# Patient Record
Sex: Female | Born: 1960
Health system: Southern US, Community
[De-identification: ages and names within clinical notes are randomized; demographics above are authoritative.]

## PROBLEM LIST (undated history)

## (undated) DIAGNOSIS — E782 Mixed hyperlipidemia: Secondary | ICD-10-CM

## (undated) DIAGNOSIS — I959 Hypotension, unspecified: Secondary | ICD-10-CM

## (undated) DIAGNOSIS — Z720 Tobacco use: Secondary | ICD-10-CM

## (undated) DIAGNOSIS — I219 Acute myocardial infarction, unspecified: Secondary | ICD-10-CM

## (undated) DIAGNOSIS — I447 Left bundle-branch block, unspecified: Secondary | ICD-10-CM

## (undated) DIAGNOSIS — R079 Chest pain, unspecified: Secondary | ICD-10-CM

## (undated) DIAGNOSIS — F1090 Alcohol use, unspecified, uncomplicated: Secondary | ICD-10-CM

## (undated) DIAGNOSIS — Z7289 Other problems related to lifestyle: Secondary | ICD-10-CM

## (undated) DIAGNOSIS — I2510 Atherosclerotic heart disease of native coronary artery without angina pectoris: Secondary | ICD-10-CM

## (undated) DIAGNOSIS — I255 Ischemic cardiomyopathy: Secondary | ICD-10-CM

## (undated) DIAGNOSIS — E039 Hypothyroidism, unspecified: Secondary | ICD-10-CM

## (undated) DIAGNOSIS — Z789 Other specified health status: Secondary | ICD-10-CM

## (undated) HISTORY — DX: Ischemic cardiomyopathy: I25.5

## (undated) HISTORY — DX: Chest pain, unspecified: R07.9

## (undated) HISTORY — DX: Hypothyroidism, unspecified: E03.9

## (undated) HISTORY — DX: Mixed hyperlipidemia: E78.2

## (undated) HISTORY — DX: Hypotension, unspecified: I95.9

## (undated) HISTORY — DX: Left bundle-branch block, unspecified: I44.7

## (undated) HISTORY — DX: Atherosclerotic heart disease of native coronary artery without angina pectoris: I25.10

---

## 2009-01-01 ENCOUNTER — Emergency Department (HOSPITAL_COMMUNITY): Admission: EM | Admit: 2009-01-01 | Discharge: 2009-01-01 | Payer: Self-pay | Admitting: Emergency Medicine

## 2009-01-01 ENCOUNTER — Encounter: Payer: Self-pay | Admitting: Cardiology

## 2009-01-21 ENCOUNTER — Encounter: Payer: Self-pay | Admitting: Cardiology

## 2009-01-27 ENCOUNTER — Ambulatory Visit: Payer: Self-pay | Admitting: Cardiology

## 2009-01-27 ENCOUNTER — Ambulatory Visit (HOSPITAL_COMMUNITY): Admission: RE | Admit: 2009-01-27 | Discharge: 2009-01-27 | Payer: Self-pay | Admitting: Cardiology

## 2009-01-27 ENCOUNTER — Encounter: Payer: Self-pay | Admitting: Cardiology

## 2009-05-07 DIAGNOSIS — R079 Chest pain, unspecified: Secondary | ICD-10-CM | POA: Insufficient documentation

## 2009-05-07 DIAGNOSIS — E785 Hyperlipidemia, unspecified: Secondary | ICD-10-CM | POA: Insufficient documentation

## 2009-05-07 DIAGNOSIS — E039 Hypothyroidism, unspecified: Secondary | ICD-10-CM | POA: Insufficient documentation

## 2010-02-27 IMAGING — CR DG CHEST 1V PORT
1 series · 1 of 1 positions shown · non-contrast
Comparison: Portable exam 1661 hours without priors for comparison.

CLINICAL DATA: Chest pain, shortness of breath, dizziness

PORTABLE CHEST - 1 VIEW

[view not recorded]
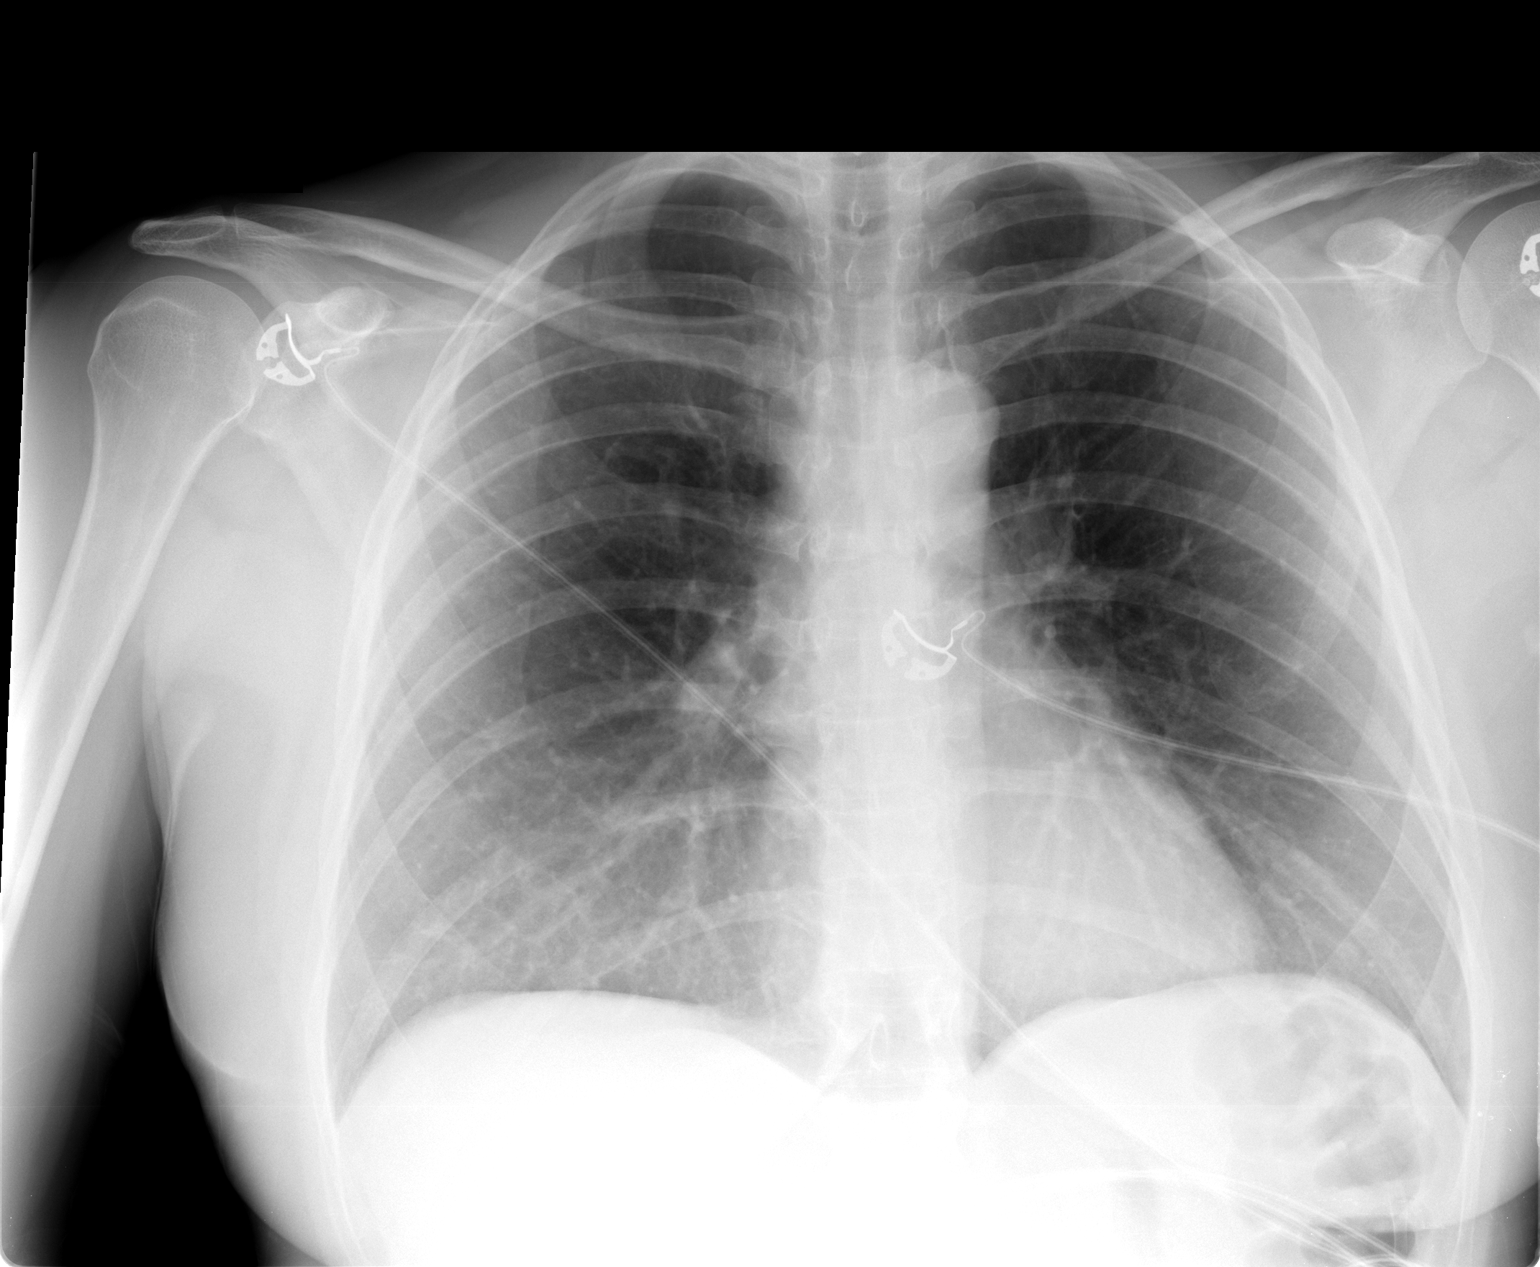

[1 of 1 positions shown; findings below may reference images not displayed]

FINDINGS: Normal heart size, mediastinal contours, and pulmonary vascularity.
Lungs clear.
No pleural effusion, pneumothorax, or focal bony abnormality.
IMPRESSION: No acute abnormalities.

## 2010-11-09 ENCOUNTER — Ambulatory Visit (HOSPITAL_COMMUNITY)
Admission: RE | Admit: 2010-11-09 | Discharge: 2010-11-09 | Payer: Self-pay | Source: Home / Self Care | Attending: Family Medicine | Admitting: Family Medicine

## 2011-02-21 LAB — BASIC METABOLIC PANEL
CO2: 25 mEq/L (ref 19–32)
Calcium: 9.6 mg/dL (ref 8.4–10.5)
Creatinine, Ser: 0.82 mg/dL (ref 0.4–1.2)
GFR calc Af Amer: >60 mL/min (ref >60)

## 2011-02-21 LAB — DIFFERENTIAL
Basophils Absolute: 0.1 10*3/uL (ref 0.0–0.1)
Basophils Relative: 1 % (ref 0–1)
Monocytes Relative: 7 % (ref 3–12)
Neutro Abs: 7.1 10*3/uL (ref 1.7–7.7)
Neutrophils Relative %: 74 % (ref 43–77)

## 2011-02-21 LAB — CBC
MCHC: 33.8 g/dL (ref 30.0–36.0)
RBC: 4.67 MIL/uL (ref 3.87–5.11)
WBC: 9.6 10*3/uL (ref 4.0–10.5)

## 2011-02-21 LAB — POCT CARDIAC MARKERS
CKMB, poc: <1 ng/mL — ABNORMAL LOW (ref 1.0–8.0)
Myoglobin, poc: 59.1 ng/mL (ref 12–200)

## 2011-02-21 LAB — T4, FREE: Free T4: 1.62 ng/dL (ref 0.89–1.80)

## 2011-03-21 NOTE — Letter (Signed)
January 27, 2009    [facility]  [address]  [place], [place] [address]   RE:  Whitney, May  MRN:  [id number]  /  DOB:  [birth year]   Whitney May returned for reevaluation today and stress testing.  She did  quite well, achieving Whitney good workload with no electrocardiographic nor  echocardiographic evidence for ischemia or infarction.  Her most recent  thyroid function studies reveal almost complete resolution of her  profound hypothyroidism.  Her most recent lipid profile is abnormal,  possibly partially as Whitney result of her hypothyroid state.  She continues  to smoke cigarettes.   PHYSICAL EXAMINATION:  GENERAL:  Pleasant [name].  VITAL SIGNS:  The heart rate is 72 and regular, blood pressure 125/75,  respirations 12 and unlabored.  NECK:  No jugular venous distention.  LUNGS:  Clear.  CARDIAC:  Normal first and second heart sounds.  EXTREMITIES:  No edema.   IMPRESSION:  Whitney May has had no further episodes of chest discomfort.  With Whitney negative stress echocardiogram, the likelihood of coronary  disease is minimal.  She does not need  additional cardiology followup at this time.  We will recommend Whitney heart  healthy diet to her and the use of Benecol margarine.  You may wish to  obtain Whitney followup lipid profile in Whitney month or two.  The importance of  uninterrupted use of her levothyroxine was emphasized.  Please let me  know at any time that I can assist in the care of this nice May in the  future.    Sincerely,      Gerrit Friends. Dietrich Pates, MD, [facility]  Electronically Signed    RMR/MedQ  DD: 01/28/2009  DT: 01/29/2009  Job #: [id number]

## 2011-03-21 NOTE — Procedures (Signed)
[name], May                ACCOUNT NO.:  [id number]   MEDICAL RECORD NO.:  [id number]          PATIENT TYPE:  OUT   LOCATION:  RAD                           FACILITY:  [facility]   PHYSICIAN:  Gerrit Friends. Dietrich Pates, MD, FACCDATE OF BIRTH:  10/11/1961   DATE OF PROCEDURE:  01/27/2009  DATE OF DISCHARGE:  01/27/2009                                ECHOCARDIOGRAM   REFERRED BY:  [facility] Department   CLINICAL DATA:  A 49 year old woman with hypothyroidism presenting with  chest pain consistent with myocardial ischemia; vascular risk factors  include tobacco use and hyperlipidemia.  1. Treadmill exercise performed to workload of 12.8 METS and a heart      rate of 159, 91% of age-predicted maximum.  Exercise discontinued      due to dyspnea and fatigue; no chest discomfort reported.  2. Blood pressure increased from a resting value of 125/75 to 150/80      during exercise, a normal response.  3. No arrhythmias noted.  4. EKG:  Normal sinus rhythm; delayed R-wave progression - Cannot      exclude prior septal myocardial infarction.  5. Stress EKG:  Interpretation somewhat impaired by muscle artifact;      no significant ST-segment abnormalities identified.  6. Baseline echocardiogram:  Normal left atrium, right atrium, and      right ventricle; normal aortic and mitral valve; normal internal      dimension, wall thickness, regional and global function of the left      ventricle.  7. Post-exercise echocardiographic images:  Decrease in left      ventricular volume and increase in contractility in all myocardial      segments.   IMPRESSION:  Negative stress echocardiogram revealing good exercise  tolerance, no structural heart abnormalities and neither  electrocardiographic nor echocardiographic evidence for ischemia or  infarction.  Other findings as noted.      Gerrit Friends. Dietrich Pates, MD, [facility]  Electronically Signed     RMR/MEDQ  D:  01/29/2009  T:  01/30/2009  Job:   [id number]

## 2011-03-21 NOTE — Letter (Signed)
January 21, 2009    [facility]  [address]  [place], [place] [address]   RE:  Whitney May, Whitney May  MRN:  [id number]  /  DOB:  12-Jun-1961   Dear Sir/Madam:   It was my pleasure evaluating Whitney May in the office today in  consultation at your request.  As you know, she first experienced an  episode of chest discomfort approximately 8 weeks ago.  This was an  impressive episode with severe chest pressure, diaphoresis, dyspnea,  radiation to the left arm, and a brief loss of consciousness with a  contusion on her forehead.  Despite the impressive nature of these  symptoms, she did not seek evaluation until 5 days thereafter.  In the  office, no particular problems were apparent, and she was referred here.  Prior to her evaluation, she experienced another episode of prompting  her to present to the emergency department.  Basic testing, cardiac  markers, chest x-ray, and EKG were all interpreted as normal.  She was  told that her symptoms were noncardiac in origin.   She has profound hypothyroidism and presented with a TSH of nearly 150  two months ago after discontinuing her thyroid replacement therapy for  approximately 4 months.  She smokes one half pack per day, down from one  pack a day.  She intermittently drinks substantial amounts of alcohol,  most recently in a binge pattern.   PAST MEDICAL HISTORY:  Otherwise, generally benign.  She has delivered 2  children by cesarean section.  She has had some issues with her weight,  but has never been obese.  She underwent an excisional biopsy of the  left breast the results of which were benign.   She reports an allergy to PENICILLIN.   MEDICATIONS:  1. Levothyroxine 0.125 mg daily.  2. Aspirin 81 mg daily.  3. Fish oil 1000 mg daily.  4. A calcium supplement.  5. A multivitamin.   SOCIAL HISTORY:  Unemployed - laid off from the [employer].  She  currently leads a sedentary lifestyle.  She is married with 2  children.   FAMILY HISTORY:  Father died due to myocardial infarction; mother is  alive and well; 3 siblings, 1 of whom died due to drug overdose.   REVIEW OF SYSTEMS:  Notable for headaches, the need for corrective  lenses for near vision, partial upper dentures, intermittent  palpitations, prior tubal ligation, and intermittent ankle edema.  All  other systems reviewed and are negative.   PHYSICAL EXAMINATION:  GENERAL:  Pleasant, proportionate woman in no  acute distress.  VITAL SIGNS:  The weight is 144, blood pressure 105/80, heart rate 75  and regular, respirations 12 and unlabored.  HEENT:  Anicteric sclerae; normal lids and conjunctivae; normal oral  mucosa.  Hoarse voice.  NECK:  No jugular venous distention; normal carotid upstrokes without  bruits.  ENDOCRINE:  No thyromegaly.  HEMATOPOIETIC:  No adenopathy.  LUNGS:  Clear.  Cardiac:  Normal first and second heart sounds; normal PMI.  ABDOMEN:  Soft and nontender; normal bowel sounds; pierced umbilicus; no  masses.  EXTREMITIES:  Normal distal pulses; no edema.  NEUROLOGIC:  Symmetric strength and tone; normal cranial nerves.   EKG:  Normal sinus rhythm; left atrial abnormality; markedly delayed R-  wave progression suggestive of prior anteroseptal myocardial infarction.  The tracing obtained in the emergency department was also reviewed.  The  final reading on this was in fact anteroseptal MI, although  this was not  noted in the emergency department note.  R-wave progression was somewhat  better on the ER tracing that it was on our office tracing.   IMPRESSION:  Whitney May has had 2 symptomatic spells of chest discomfort  that is highly suggestive of an ischemic etiology with typical  associated symptoms and radiation.  Nonetheless, as a premenopausal 3-  year-old without diabetes or hypertension, she does not have a very high  risk for coronary disease.  This could represent coronary artery spasm.  It is possible  that the marked systemic perturbations of her  hypothyroidism could have contributed to that.  She has been told of  hyperlipidemia in the past - we will attempt to obtain those records.  For now, I will give her sublingual nitroglycerin to use on a p.r.n.  basis.  She will return for an echocardiogram to rule out prior  myocardial infarction and a stress echocardiogram to rule out ischemia.  I will let you know the results of those studies as soon as they have  been completed.  Thanks for sending this nice woman to me and for  sending me such good office records.    Sincerely,      Gerrit Friends. Dietrich Pates, MD, [facility]  Electronically Signed    RMR/MedQ  DD: 01/21/2009  DT: 01/22/2009  Job #: [id number]

## 2011-10-18 ENCOUNTER — Encounter: Payer: Self-pay | Admitting: Cardiology

## 2015-09-24 ENCOUNTER — Emergency Department (HOSPITAL_COMMUNITY): Payer: BLUE CROSS/BLUE SHIELD

## 2015-09-24 ENCOUNTER — Observation Stay (HOSPITAL_COMMUNITY)
Admission: EM | Admit: 2015-09-24 | Discharge: 2015-09-25 | Disposition: A | Discharge status: Home / Self Care | Payer: BLUE CROSS/BLUE SHIELD | Attending: Internal Medicine | Admitting: Internal Medicine

## 2015-09-24 ENCOUNTER — Emergency Department (HOSPITAL_BASED_OUTPATIENT_CLINIC_OR_DEPARTMENT_OTHER): Payer: BLUE CROSS/BLUE SHIELD

## 2015-09-24 ENCOUNTER — Encounter (HOSPITAL_COMMUNITY): Payer: Self-pay

## 2015-09-24 DIAGNOSIS — R0789 Other chest pain: Secondary | ICD-10-CM | POA: Diagnosis present

## 2015-09-24 DIAGNOSIS — R079 Chest pain, unspecified: Secondary | ICD-10-CM

## 2015-09-24 DIAGNOSIS — Z880 Allergy status to penicillin: Secondary | ICD-10-CM | POA: Diagnosis not present

## 2015-09-24 DIAGNOSIS — Z789 Other specified health status: Secondary | ICD-10-CM | POA: Diagnosis not present

## 2015-09-24 DIAGNOSIS — Z7289 Other problems related to lifestyle: Secondary | ICD-10-CM | POA: Diagnosis present

## 2015-09-24 DIAGNOSIS — Z7982 Long term (current) use of aspirin: Secondary | ICD-10-CM | POA: Diagnosis not present

## 2015-09-24 DIAGNOSIS — E785 Hyperlipidemia, unspecified: Secondary | ICD-10-CM | POA: Insufficient documentation

## 2015-09-24 DIAGNOSIS — Z79899 Other long term (current) drug therapy: Secondary | ICD-10-CM | POA: Insufficient documentation

## 2015-09-24 DIAGNOSIS — F17200 Nicotine dependence, unspecified, uncomplicated: Secondary | ICD-10-CM | POA: Diagnosis not present

## 2015-09-24 DIAGNOSIS — I313 Pericardial effusion (noninflammatory): Principal | ICD-10-CM | POA: Insufficient documentation

## 2015-09-24 DIAGNOSIS — I319 Disease of pericardium, unspecified: Secondary | ICD-10-CM | POA: Diagnosis not present

## 2015-09-24 DIAGNOSIS — M549 Dorsalgia, unspecified: Secondary | ICD-10-CM | POA: Diagnosis present

## 2015-09-24 DIAGNOSIS — E038 Other specified hypothyroidism: Secondary | ICD-10-CM | POA: Diagnosis not present

## 2015-09-24 DIAGNOSIS — I447 Left bundle-branch block, unspecified: Secondary | ICD-10-CM

## 2015-09-24 DIAGNOSIS — E039 Hypothyroidism, unspecified: Secondary | ICD-10-CM | POA: Diagnosis present

## 2015-09-24 DIAGNOSIS — Z720 Tobacco use: Secondary | ICD-10-CM

## 2015-09-24 DIAGNOSIS — I3139 Other pericardial effusion (noninflammatory): Secondary | ICD-10-CM | POA: Diagnosis present

## 2015-09-24 DIAGNOSIS — E782 Mixed hyperlipidemia: Secondary | ICD-10-CM | POA: Diagnosis not present

## 2015-09-24 HISTORY — DX: Alcohol use, unspecified, uncomplicated: F10.90

## 2015-09-24 HISTORY — DX: Other specified health status: Z78.9

## 2015-09-24 HISTORY — DX: Tobacco use: Z72.0

## 2015-09-24 HISTORY — DX: Other problems related to lifestyle: Z72.89

## 2015-09-24 LAB — TSH: TSH: 4.088 u[IU]/mL (ref 0.350–4.500)

## 2015-09-24 LAB — BASIC METABOLIC PANEL
ANION GAP: 8 (ref 5–15)
BUN: 10 mg/dL (ref 6–20)
CALCIUM: 8.8 mg/dL — AB (ref 8.9–10.3)
CO2: 26 mmol/L (ref 22–32)
Chloride: 100 mmol/L — ABNORMAL LOW (ref 101–111)
Creatinine, Ser: 0.84 mg/dL (ref 0.44–1.00)
Glucose, Bld: 131 mg/dL — ABNORMAL HIGH (ref 65–99)
Potassium: 4.4 mmol/L (ref 3.5–5.1)
Sodium: 134 mmol/L — ABNORMAL LOW (ref 135–145)

## 2015-09-24 LAB — CBC WITH DIFFERENTIAL/PLATELET
BASOS ABS: 0 10*3/uL (ref 0.0–0.1)
BASOS PCT: 0 %
Eosinophils Absolute: 0.1 10*3/uL (ref 0.0–0.7)
Eosinophils Relative: 1 %
HEMATOCRIT: 41 % (ref 36.0–46.0)
Hemoglobin: 13.6 g/dL (ref 12.0–15.0)
Lymphocytes Relative: 12 %
Lymphs Abs: 1.8 10*3/uL (ref 0.7–4.0)
MCH: 30.6 pg (ref 26.0–34.0)
MCHC: 33.2 g/dL (ref 30.0–36.0)
MCV: 92.3 fL (ref 78.0–100.0)
MONO ABS: 2 10*3/uL — AB (ref 0.1–1.0)
Monocytes Relative: 14 %
NEUTROS ABS: 10.9 10*3/uL — AB (ref 1.7–7.7)
Neutrophils Relative %: 74 %
PLATELETS: 292 10*3/uL (ref 150–400)
RBC: 4.44 MIL/uL (ref 3.87–5.11)
RDW: 13.6 % (ref 11.5–15.5)
WBC: 14.9 10*3/uL — AB (ref 4.0–10.5)

## 2015-09-24 LAB — TROPONIN I: Troponin I: <0.03 ng/mL (ref <0.031)

## 2015-09-24 LAB — D-DIMER, QUANTITATIVE (NOT AT ARMC): D DIMER QUANT: 0.97 ug{FEU}/mL — AB (ref 0.00–0.50)

## 2015-09-24 MED ORDER — NAPROXEN SODIUM 220 MG PO TABS: 220.0000 mg | ORAL_TABLET | Freq: Two times a day (BID) | ORAL | Status: DC

## 2015-09-24 MED ORDER — SODIUM CHLORIDE 0.9 % IJ SOLN
3.0000 mL | Freq: Two times a day (BID) | INTRAMUSCULAR | Status: DC
  Administered 2015-09-24: 3 mL via INTRAVENOUS

## 2015-09-24 MED ORDER — ALBUTEROL SULFATE (2.5 MG/3ML) 0.083% IN NEBU: 2.5000 mg | INHALATION_SOLUTION | RESPIRATORY_TRACT | Status: DC | PRN

## 2015-09-24 MED ORDER — ADULT MULTIVITAMIN W/MINERALS CH
1.0000 | ORAL_TABLET | Freq: Every day | ORAL | Status: DC
  Administered 2015-09-24 – 2015-09-25 (×2): 1 via ORAL
  Filled 2015-09-24 (×2): qty 1

## 2015-09-24 MED ORDER — ASPIRIN 81 MG PO CHEW
81.0000 mg | CHEWABLE_TABLET | Freq: Once | ORAL | Status: AC
  Administered 2015-09-24: 81 mg via ORAL
  Filled 2015-09-24: qty 1

## 2015-09-24 MED ORDER — IOHEXOL 350 MG/ML SOLN
100.0000 mL | Freq: Once | INTRAVENOUS | Status: AC | PRN
  Administered 2015-09-24: 100 mL via INTRAVENOUS

## 2015-09-24 MED ORDER — HYDROCODONE-ACETAMINOPHEN 10-325 MG PO TABS: 2.0000 | ORAL_TABLET | ORAL | Status: DC | PRN

## 2015-09-24 MED ORDER — THIAMINE HCL 100 MG/ML IJ SOLN: 100.0000 mg | Freq: Every day | INTRAMUSCULAR | Status: DC

## 2015-09-24 MED ORDER — MORPHINE SULFATE (PF) 2 MG/ML IV SOLN: 2.0000 mg | INTRAVENOUS | Status: DC | PRN

## 2015-09-24 MED ORDER — LEVOTHYROXINE SODIUM 112 MCG PO TABS
112.0000 ug | ORAL_TABLET | Freq: Every day | ORAL | Status: DC
  Administered 2015-09-25: 112 ug via ORAL
  Filled 2015-09-24: qty 1

## 2015-09-24 MED ORDER — VITAMIN B-1 100 MG PO TABS
100.0000 mg | ORAL_TABLET | Freq: Every day | ORAL | Status: DC
  Administered 2015-09-24 – 2015-09-25 (×2): 100 mg via ORAL
  Filled 2015-09-24 (×2): qty 1

## 2015-09-24 MED ORDER — PANTOPRAZOLE SODIUM 40 MG PO TBEC
40.0000 mg | DELAYED_RELEASE_TABLET | Freq: Every day | ORAL | Status: DC
  Administered 2015-09-24: 40 mg via ORAL
  Filled 2015-09-24: qty 1

## 2015-09-24 MED ORDER — ROSUVASTATIN CALCIUM 10 MG PO TABS
10.0000 mg | ORAL_TABLET | Freq: Every day | ORAL | Status: DC
  Filled 2015-09-24: qty 1

## 2015-09-24 MED ORDER — NAPROXEN SODIUM 550 MG PO TABS
550.0000 mg | ORAL_TABLET | Freq: Two times a day (BID) | ORAL | Status: DC
  Filled 2015-09-24 (×6): qty 1

## 2015-09-24 MED ORDER — ENOXAPARIN SODIUM 40 MG/0.4ML ~~LOC~~ SOLN
40.0000 mg | SUBCUTANEOUS | Status: DC
Start: 2015-09-24 — End: 2015-09-25
  Administered 2015-09-24: 40 mg via SUBCUTANEOUS
  Filled 2015-09-24: qty 0.4

## 2015-09-24 MED ORDER — ASPIRIN EC 325 MG PO TBEC
325.0000 mg | DELAYED_RELEASE_TABLET | Freq: Every day | ORAL | Status: DC
Start: 2015-09-25 — End: 2015-09-25
  Administered 2015-09-25: 325 mg via ORAL
  Filled 2015-09-24: qty 1

## 2015-09-24 MED ORDER — ACETAMINOPHEN 650 MG RE SUPP: 650.0000 mg | Freq: Four times a day (QID) | RECTAL | Status: DC | PRN

## 2015-09-24 MED ORDER — CYCLOBENZAPRINE HCL 10 MG PO TABS
5.0000 mg | ORAL_TABLET | Freq: Three times a day (TID) | ORAL | Status: DC
  Administered 2015-09-24 – 2015-09-25 (×3): 5 mg via ORAL
  Filled 2015-09-24 (×3): qty 1

## 2015-09-24 MED ORDER — ROSUVASTATIN CALCIUM 10 MG PO TABS: 10.0000 mg | ORAL_TABLET | Freq: Every day | ORAL | Status: DC

## 2015-09-24 MED ORDER — FOLIC ACID 1 MG PO TABS
1.0000 mg | ORAL_TABLET | Freq: Every day | ORAL | Status: DC
Start: 2015-09-24 — End: 2015-09-25
  Administered 2015-09-24 – 2015-09-25 (×2): 1 mg via ORAL
  Filled 2015-09-24 (×2): qty 1

## 2015-09-24 MED ORDER — ACETAMINOPHEN 325 MG PO TABS: 650.0000 mg | ORAL_TABLET | Freq: Four times a day (QID) | ORAL | Status: DC | PRN

## 2015-09-24 MED ORDER — LORAZEPAM 2 MG/ML IJ SOLN: 1.0000 mg | Freq: Four times a day (QID) | INTRAMUSCULAR | Status: DC | PRN

## 2015-09-24 MED ORDER — LEVOTHYROXINE SODIUM 112 MCG PO TABS: 112.0000 ug | ORAL_TABLET | Freq: Every day | ORAL | Status: DC

## 2015-09-24 MED ORDER — NAPROXEN 250 MG PO TABS
500.0000 mg | ORAL_TABLET | Freq: Two times a day (BID) | ORAL | Status: DC
  Administered 2015-09-24 – 2015-09-25 (×2): 500 mg via ORAL
  Filled 2015-09-24 (×2): qty 2

## 2015-09-24 MED ORDER — NICOTINE 21 MG/24HR TD PT24
21.0000 mg | MEDICATED_PATCH | Freq: Every day | TRANSDERMAL | Status: DC
  Administered 2015-09-24: 21 mg via TRANSDERMAL
  Filled 2015-09-24 (×2): qty 1

## 2015-09-24 MED ORDER — LORAZEPAM 1 MG PO TABS: 1.0000 mg | ORAL_TABLET | Freq: Four times a day (QID) | ORAL | Status: DC | PRN

## 2015-09-24 NOTE — Consult Note (Signed)
CARDIOLOGY CONSULT NOTE   Patient ID: Whitney May MRN: [id number] DOB/AGE: 52-Aug-1962 52 y.o.  Admit Date: 09/24/2015 Referring Physician: PTH Primary Physician: Toma Deiters, MD Consulting Cardiologist: Prentice Docker MD Primary Cardiologist: New (Formerly Dietrich Pates)  Reason for Consultation: Chest Pain, new LBBB.   Clinical Summary [name] is a 54 y.o.female with known history of hyperlipidemia, hypothyroidism, ETOH and tobacco abuse, presented to ER with complaints of back pain and chest pain.   She states that pain began yesterday evening beginning between her shoulder blades and radiating to the back of her neck and into her shoulders bilaterally. She then began to have aching feeling substernal. Lasted all night and did not allow her to sleep. The following morning while walking at work, she began to have worsening chest discomfort and dyspnea. Pain became worse with deep breathing. 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  On arrival to ER BP 132/76, HR 97, O2 Sat 98%. Afebrile. D-Dimer elevated. Leukocytosis with WBC 15.3, Na 134, glucose 131.  CT scan to rule out PE was negative but was found to have a prominent pericardial effusion, and emphysema.  CXR without acute cardiopulmonary disease. EKG revealed NSR with LBBB which is a new finding compared to previous EKG in March 2010. Preliminary echo report demonstrated small pericardial effusion. Troponin negative x 1.   Pain continues but "not as bad" continues to hurt with deep breathing. She admits to binge drinking on her days off with mixed drinks and wine.    Allergies  Allergen Reactions  . Penicillins Other (See Comments)    Fever, vomiting Has patient had a PCN reaction causing immediate rash, facial/tongue/throat swelling, SOB or lightheadedness with hypotension:  no Has patient had a PCN reaction causing severe rash involving mucus membranes or skin necrosis:  no Has patient had a  PCN reaction that required hospitalization  no Has patient had a PCN reaction occurring within the last 10 years:  no If all of the above answers are "NO", then may procee    Medications Scheduled Medications:    Infusions:    PRN Medications:     Past Medical History  Diagnosis Date  . Hypothyroidism   . Hyperlipidemia, mixed   . Chest pain, unspecified   . Tobacco abuse   . Alcohol use [facility])     Past Surgical History  Procedure Laterality Date  . Cesarean section      Family History  Problem Relation Age of Onset  . Coronary artery disease Mother   . Coronary artery disease Maternal Grandmother   . Hypertension Father   . Diabetes Father     Social History [name] reports that she has been smoking.  She does not have any smokeless tobacco history on file. [name] reports that she drinks about 4.8 oz of alcohol per week.  Review of Systems Complete review of systems are found to be negative unless outlined in H&P above.  Physical Examination Blood pressure 111/64, pulse 92, temperature 99.1 F (37.3 C), temperature source Oral, resp. rate 20, height  (1.575 m), weight 133 lb (60.328 kg), SpO2 95 %. No intake or output data in the 24 hours ending 09/24/15 1700  Telemetry: SR with LBBB.   GEN: No acute distress  HEENT: Conjunctiva and lids normal, oropharynx clear with moist mucosa. Neck: Supple, no elevated JVP or carotid bruits, no thyromegaly. Lungs: Upper airway raspy, but otherwise clear. Pain with  inspiration.  Cardiac: Regular rate and rhythm, no S3 or significant systolic murmur, no pericardial rub. Abdomen: Soft, nontender, no hepatomegaly, bowel sounds present, no guarding or rebound. Extremities: No pitting edema, distal pulses 2+. Skin: Warm and dry. Musculoskeletal: No kyphosis. Neuropsychiatric: Alert and oriented x3, affect grossly appropriate.  Prior Cardiac Testing/Procedures 1. Stress Echo 01/29/2009 CLINICAL DATA: A  54 year old woman with hypothyroidism presenting with chest pain consistent with myocardial ischemia; vascular risk factors include tobacco use and hyperlipidemia. 1. Treadmill exercise performed to workload of 12.8 METS and a heart  rate of 159, 91% of age-predicted maximum. Exercise discontinued  due to dyspnea and fatigue; no chest discomfort reported. 2. Blood pressure increased from a resting value of 125/75 to 150/80  during exercise, a normal response. 3. No arrhythmias noted. 4. EKG: Normal sinus rhythm; delayed R-wave progression - Cannot  exclude prior septal myocardial infarction. 5. Stress EKG: Interpretation somewhat impaired by muscle artifact;  no significant ST-segment abnormalities identified. 6. Baseline echocardiogram: Normal left atrium, right atrium, and  right ventricle; normal aortic and mitral valve; normal internal  dimension, wall thickness, regional and global function of the left  ventricle. 7. Post-exercise echocardiographic images: Decrease in left  ventricular volume and increase in contractility in all myocardial  segments.  IMPRESSION: Negative stress echocardiogram revealing good exercise tolerance, no structural heart abnormalities and neither electrocardiographic nor echocardiographic evidence for ischemia or infarction.   Lab Results  Basic Metabolic Panel:  Recent Labs Lab 09/24/15 1104  NA 134*  K 4.4  CL 100*  CO2 26  GLUCOSE 131*  BUN 10  CREATININE 0.84  CALCIUM 8.8*    Liver Function Tests: No results for input(s): AST, ALT, ALKPHOS, BILITOT, PROT, ALBUMIN in the last 168 hours.  CBC:  Recent Labs Lab 09/24/15 1104  WBC 14.9*  NEUTROABS 10.9*  HGB 13.6  HCT 41.0  MCV 92.3  PLT 292    Cardiac Enzymes:  Recent Labs Lab 09/24/15 1104  TROPONINI <0.03    Radiology: Dg Chest 2 View  09/24/2015  CLINICAL DATA:  Chest pain that goes into left shoulder  with sob since yesterday/smoker EXAM: CHEST  2 VIEW COMPARISON:  01/01/2009 FINDINGS: Cardiac silhouette is mildly enlarged. No mediastinal or hilar masses or evidence of adenopathy. Mild linear subsegmental atelectasis noted at the lung bases. No lung consolidation to suggest pneumonia. No pulmonary edema. No pleural effusion or pneumothorax. Skeletal structures are unremarkable. IMPRESSION: No acute cardiopulmonary disease. Electronically Signed   By: Amie Portland M.D.   On: 09/24/2015 12:16   Ct Angio Chest Pe W/cm &/or Wo Cm  09/24/2015  CLINICAL DATA:  Chest pain and shortness of breath. EXAM: CT ANGIOGRAPHY CHEST WITH CONTRAST TECHNIQUE: Multidetector CT imaging of the chest was performed using the standard protocol during bolus administration of intravenous contrast. Multiplanar CT image reconstructions and MIPs were obtained to evaluate the vascular anatomy. CONTRAST:  OMNIPAQUE IOHEXOL 350 MG/ML SOLN COMPARISON:  Chest x-ray 09/24/2015 FINDINGS: There are no pulmonary emboli. There is a prominent pericardial effusion. Multiple slightly prominent lymph nodes in the mediastinum, primarily in the aortopulmonary window with the largest transverse diameter being 12 mm. Slight emphysematous blebs at the lung apices. 4 mm nodule in the left lung apex on image 16 of series 4. Small intrapulmonary node along the minor fissure on image 53 of series 4. Slight atelectasis or scarring at the lung base. No osseous abnormality. The visualized portion of the upper abdomen is normal. Review of the MIP images  confirms the above findings. IMPRESSION: 1. No pulmonary emboli. 2. Prominent pericardial effusion. No appreciable compression of the heart. 3. Slight emphysematous disease at the apices. 4. 4 mm nodule in the left lung apex. If the patient is at high risk for bronchogenic carcinoma, follow-up chest CT at 1 year is recommended. If the patient is at low risk, no follow-up is needed. This recommendation follows  the consensus statement: Guidelines for Management of Small Pulmonary Nodules Detected on CT Scans: A Statement from the Fleischner Society as published in Radiology 2005; 237:395-400. Electronically Signed   By: Francene Boyers M.D.   On: 09/24/2015 13:37     ECG: SR with LBBB and Left axis deviation.    Impression and Recommendations  1. Chest Pain: Typical and atypical features. Substernal ache, pain also between shoulder blades and back of neck. Troponin is negative X 1. New LBBB of unknown duration as she has not been seen by cardiology since 2010 with normal stress echo.Pain is constant and becomes worse with deep breathing.   There is a pleuritic component to her symptoms. Will restart her NSAIDs to help with inflammatory type symptoms. She has CVRF for CAD to include FH, hypercholesterolemia, thyroid disease, tobacco abuse. S  Would continue to cycle enzymes to evaluate for changes. She would benefit from nuclear stress test once her breathing status improves. No need to transfer to [facility] unless enzymes become positive or chest pain worsens.   2. Mild Pericardial Effusion: Please formal echo report once completed. If significant decrease in EF would plan cardiac cath instead of stress test.   3. Hypercholesterolemia: She is on statin. Continue and check lipids in the am.   4. Ongoing tobacco abuse: May need nicotine patch.       Signed: Bettey Mare. Lawrence NP [facility]  09/24/2015, 5:00 PM Co-Sign MD  The patient was seen and examined, and I agree with the assessment and plan as documented above, with modifications as noted below. Pt admitted with chest tightness and exertional dyspnea which began at 6 pm yesterday. Pain made worse with deep inspiration. Has h/o pleurisy. Has smoked 1 ppd since age 74. Troponins normal x 2. "New" left bundle branch block since 2010. I reviewed echocardiogram, which showed a small to medium size pericardial effusion without tamponade physiology. LV  systolic function is normal, EF 60-65%, mild LVH, grade 2 diastolic dysfunction. If all troponins are normal, can proceed with an outpatient Lexiscan Cardiolite stress test which I will arrange. I will also arrange for follow up with me in our [place] office.   Prentice Docker, MD, [facility]  09/24/2015 7:07 PM

## 2015-09-24 NOTE — ED Provider Notes (Signed)
CSN: [id number]     Arrival date & time 09/24/15  1038 History  By signing my name below, I, Whitney May, attest that this documentation has been prepared under the direction and in the presence of Gerhard Munch, MD.  Electronically Signed: Gwenyth May, ED Scribe. 09/24/2015. 12:20 PM.   Chief Complaint  Patient presents with  . Chest Pain   The history is provided by the patient. No language interpreter was used.   HPI Comments: Whitney May is a 54 y.o. female with a history of hypothyroidism and hyperlipidemia who presents to the Emergency Department complaining of constant, moderate, bilateral posterior shoulder pain that radiates to her left chest and arm and started 18 hours ago. She states recent weight gain and bloating as associated symptoms. Her symptoms become worse with deep breathing and exertion. Pt smokes cigarettes. She denies fever, hemoptysis, nausea, vomiting, syncope, confusion and leg swelling.   Past Medical History  Diagnosis Date  . Hypothyroidism   . Hyperlipidemia, mixed   . Chest pain, unspecified    Past Surgical History  Procedure Laterality Date  . Cesarean section     Family History  Problem Relation Age of Onset  . Coronary artery disease Other    Social History  Substance Use Topics  . Smoking status: Current Every Day Smoker -- 1.00 packs/day  . Smokeless tobacco: None  . Alcohol Use: No   OB History    No data available     Review of Systems  Constitutional: Negative for fever.       Per HPI, otherwise negative  HENT:       Per HPI, otherwise negative  Respiratory:       Per HPI, otherwise negative  Cardiovascular: Positive for chest pain. Negative for leg swelling.       Per HPI, otherwise negative  Gastrointestinal: Negative for nausea and vomiting.  Endocrine:       Negative aside from HPI  Genitourinary:       Neg aside from HPI   Musculoskeletal: Positive for arthralgias.       Per HPI, otherwise negative  Skin:  Negative.   Neurological: Negative for syncope.  Psychiatric/Behavioral: Negative for confusion.  All other systems reviewed and are negative.  Allergies  Penicillins  Home Medications   Prior to Admission medications   Medication Sig Start Date End Date Taking? Authorizing Provider  aspirin 325 MG tablet Take 325 mg by mouth daily as needed for moderate pain.   Yes Historical Provider, MD  clobetasol cream (TEMOVATE) 0.05 % Apply 1 application topically daily. 07/09/15  Yes Historical Provider, MD  hydroxypropyl methylcellulose / hypromellose (ISOPTO TEARS / GONIOVISC) 2.5 % ophthalmic solution Place 1 drop into both eyes as needed for dry eyes.   Yes Historical Provider, MD  levothyroxine (SYNTHROID, LEVOTHROID) 112 MCG tablet Take 1 tablet by mouth daily. 09/14/15  Yes Historical Provider, MD  Magnesium Salicylate Tetrahyd (QC BACKACHE RELIEF MAX ST PO) Take 2 tablets by mouth daily as needed (pain).   Yes Historical Provider, MD  naproxen sodium (ANAPROX) 220 MG tablet Take 220 mg by mouth daily as needed (pain).   Yes Historical Provider, MD  rosuvastatin (CRESTOR) 10 MG tablet Take 1 tablet by mouth daily. 09/14/15  Yes Historical Provider, MD   BP 122/71 mmHg  Pulse 86  Temp(Src) 98 F (36.7 C) (Oral)  Resp 19  Ht 5\' 2"  (1.575 m)  Wt 133 lb (60.328 kg)  BMI 24.32 kg/m2  SpO2 93%  Physical Exam  Constitutional: She is oriented to person, place, and time. She appears well-developed and well-nourished. No distress.  HENT:  Head: Normocephalic and atraumatic.  Eyes: Conjunctivae and EOM are normal.  Cardiovascular: Normal rate and regular rhythm.   Pulmonary/Chest: Effort normal. No stridor. No respiratory distress.  Coarse breath sounds in left upper lung  Abdominal: She exhibits no distension.  Musculoskeletal: She exhibits no edema.  Neurological: She is alert and oriented to person, place, and time. No cranial nerve deficit.  Skin: Skin is warm and dry.  Psychiatric: She  has a normal mood and affect.  Nursing note and vitals reviewed.   ED Course  Procedures  DIAGNOSTIC STUDIES: Oxygen Saturation is 98% on RA, normal by my interpretation.    COORDINATION OF CARE: 12:23 PM Discussed treatment plan with pt which includes lab work, CT Angio Chest PE and a chest x-ray. Pt agreed to plan.   Labs Review Labs Reviewed  CBC WITH DIFFERENTIAL/PLATELET - Abnormal; Notable for the following:    WBC 14.9 (*)    Neutro Abs 10.9 (*)    Monocytes Absolute 2.0 (*)    All other components within normal limits  BASIC METABOLIC PANEL - Abnormal; Notable for the following:    Sodium 134 (*)    Chloride 100 (*)    Glucose, Bld 131 (*)    Calcium 8.8 (*)    All other components within normal limits  D-DIMER, QUANTITATIVE (NOT AT [facility]) - Abnormal; Notable for the following:    D-Dimer, Quant 0.97 (*)    All other components within normal limits  TROPONIN I   Imaging Review Dg Chest 2 View  09/24/2015  CLINICAL DATA:  Chest pain that goes into left shoulder with sob since yesterday/smoker EXAM: CHEST  2 VIEW COMPARISON:  01/01/2009 FINDINGS: Cardiac silhouette is mildly enlarged. No mediastinal or hilar masses or evidence of adenopathy. Mild linear subsegmental atelectasis noted at the lung bases. No lung consolidation to suggest pneumonia. No pulmonary edema. No pleural effusion or pneumothorax. Skeletal structures are unremarkable. IMPRESSION: No acute cardiopulmonary disease. Electronically Signed   By: Amie Portland M.D.   On: 09/24/2015 12:16   Ct Angio Chest Pe W/cm &/or Wo Cm  09/24/2015  CLINICAL DATA:  Chest pain and shortness of breath. EXAM: CT ANGIOGRAPHY CHEST WITH CONTRAST TECHNIQUE: Multidetector CT imaging of the chest was performed using the standard protocol during bolus administration of intravenous contrast. Multiplanar CT image reconstructions and MIPs were obtained to evaluate the vascular anatomy. CONTRAST:  OMNIPAQUE IOHEXOL 350 MG/ML SOLN  COMPARISON:  Chest x-ray 09/24/2015 FINDINGS: There are no pulmonary emboli. There is a prominent pericardial effusion. Multiple slightly prominent lymph nodes in the mediastinum, primarily in the aortopulmonary window with the largest transverse diameter being 12 mm. Slight emphysematous blebs at the lung apices. 4 mm nodule in the left lung apex on image 16 of series 4. Small intrapulmonary node along the minor fissure on image 53 of series 4. Slight atelectasis or scarring at the lung base. No osseous abnormality. The visualized portion of the upper abdomen is normal. Review of the MIP images confirms the above findings. IMPRESSION: 1. No pulmonary emboli. 2. Prominent pericardial effusion. No appreciable compression of the heart. 3. Slight emphysematous disease at the apices. 4. 4 mm nodule in the left lung apex. If the patient is at high risk for bronchogenic carcinoma, follow-up chest CT at 1 year is recommended. If the patient is at low risk, no follow-up is  needed. This recommendation follows the consensus statement: Guidelines for Management of Small Pulmonary Nodules Detected on CT Scans: A Statement from the Fleischner Society as published in Radiology 2005; 237:395-400. Electronically Signed   By: Francene Boyers M.D.   On: 09/24/2015 13:37   I have personally reviewed and evaluated these images and lab results as part of my medical decision-making.   EKG Interpretation   Date/Time:  Friday September 24 2015 10:43:25 EST Ventricular Rate:  99 PR Interval:  148 QRS Duration: 122 QT Interval:  398 QTC Calculation: 510 R Axis:   -62 Text Interpretation:  Normal sinus rhythm Left axis deviation Left bundle  branch block Abnormal ECG Sinus rhythm Left bundle branch block Abnormal  ekg Confirmed by Gerhard Munch  MD [phone]) on 09/24/2015 10:53:50 AM     On return from CT scan, I discussed the patient's results with her.  She is much more calm now than on arrival. In addition, I discussed  the patient's findings with our cardiology colleagues, and subsequently with the hospitalist team. I arranged for echocardiogram given the patient's new pericardial effusion.  MDM   Final diagnoses:  Pericardial effusion   patient with a long history of smoking presents with new dyspnea, left-sided chest, back pain. Here, the patient is awake, alert, but has tachypnea, coarse breath sounds initially. Patient's evaluation is most notable for the demonstration of pericardial effusion. No evidence for acute tamponade, though w dyspnea, CP, I discussed her case with cardiology, hospitalist for admission.  Gerhard Munch, MD 09/24/15 1420

## 2015-09-24 NOTE — ED Notes (Signed)
Hospitalist at bedside 

## 2015-09-24 NOTE — ED Notes (Signed)
Pt reports last night around 6pm started having pain in upper back and neck.  Reports pain started radiating around to chest and pain is worse with deep breaths.  Reports last night started having aching in neck.  Pt says was at work this morning and while walking, started having worsening pain and became SOB.

## 2015-09-24 NOTE — ED Notes (Signed)
Bernie in for ECHO.

## 2015-09-24 NOTE — ED Notes (Signed)
Pt has 325 mg aprin at 0940.

## 2015-09-24 NOTE — H&P (Signed)
History and Physical  Whitney May IDH:[id number] DOB: February 16, 1961 DOA: 09/24/2015  Referring physician: Dr. Gerhard Munch, EDP PCP: Toma Deiters, MD  Outpatient Specialists:  1. None  Chief Complaint: Back pain and chest pain.  HPI: Whitney May is a 54 y.o. female , married, independent of activities of daily living, working, history of HLD, hypothyroid, tobacco abuse, alcohol use, presented to the [facility] ED on 09/24/15 with complaints of back pain and chest pain. She was in her usual state of health until 6 PM on 11/17 and started experiencing back pain which seemed to start in the right scapular region, then went up to left upper scapular region. This was associated with pain of left upper arm and mid sternum. Described as aching in nature. Rated at moderate severity when taking deep breaths or chest wall movements or coughing or sneezing. Has chronic nonproductive smoker's cough. Noted some dyspnea on exertion today while at work. Denies fever or chills. No recent long distance travel. Does lift heavy stuff at work but does not think that she sustained a sprain. No direct trauma. States that she had done a stress test for dizziness several years ago-cannot remember results. History of heart disease in grandmother who died at age 38 and aunt had CABG at unknown age. In the ED, CTA chest negative for PE but shows prominent pericardial effusion and a pulmonary nodule, troponin 1, EKG shows LBBB which is new compared to EKGs in 2010. Cardiology consulted by EDP. Hospitalist admission was requested.   Review of Systems: All systems reviewed and apart from history of presenting illness, are negative.  Past Medical History  Diagnosis Date  . Hypothyroidism   . Hyperlipidemia, mixed   . Chest pain, unspecified   . Tobacco abuse   . Alcohol use [facility])    Past Surgical History  Procedure Laterality Date  . Cesarean section     Social History:  reports that she has been  smoking.  She does not have any smokeless tobacco history on file. She reports that she drinks about 4.8 oz of alcohol per week. She reports that she does not use illicit drugs. Married. Lives with spouse. States that she smokes up to a pack of cigarettes per day. Indicates that she drinks 2-3 drinks of either wine or margarita's up to 4 times a week. Denies drug abuse.  Allergies  Allergen Reactions  . Penicillins Other (See Comments)    Family History  Problem Relation Age of Onset  . Coronary artery disease Other     Prior to Admission medications   Medication Sig Start Date End Date Taking? Authorizing Provider  aspirin 325 MG tablet Take 325 mg by mouth daily as needed for moderate pain.   Yes Historical Provider, MD  clobetasol cream (TEMOVATE) 0.05 % Apply 1 application topically daily. 07/09/15  Yes Historical Provider, MD  hydroxypropyl methylcellulose / hypromellose (ISOPTO TEARS / GONIOVISC) 2.5 % ophthalmic solution Place 1 drop into both eyes as needed for dry eyes.   Yes Historical Provider, MD  levothyroxine (SYNTHROID, LEVOTHROID) 112 MCG tablet Take 1 tablet by mouth daily. 09/14/15  Yes Historical Provider, MD  Magnesium Salicylate Tetrahyd (QC BACKACHE RELIEF MAX ST PO) Take 2 tablets by mouth daily as needed (pain).   Yes Historical Provider, MD  naproxen sodium (ANAPROX) 220 MG tablet Take 220 mg by mouth daily as needed (pain).   Yes Historical Provider, MD  rosuvastatin (CRESTOR) 10 MG tablet Take 1 tablet  by mouth daily. 09/14/15  Yes Historical Provider, MD   Physical Exam: Filed Vitals:   09/24/15 1130 09/24/15 1200 09/24/15 1230 09/24/15 1422  BP: 102/74 105/77 122/71 94/78  Pulse: 88 84 86 86  Temp:      TempSrc:      Resp: Height:      Weight:      SpO2: 95% 94% 93% 96%   temperature: 10F.   General exam: Moderately built and nourished pleasant middle-aged female patient, lying comfortably supine on the gurney in no obvious  distress.  Head, eyes and ENT: Nontraumatic and normocephalic. Pupils equally reacting to light and accommodation. Oral mucosa moist.  Neck: Supple. No JVD, carotid bruit or thyromegaly.  Lymphatics: No lymphadenopathy.  Respiratory system: Clear to auscultation. No increased work of breathing. Reproducible lower midsternal chest wall pain.  Cardiovascular system: S1 and S2 heard, RRR. No JVD, murmurs, gallops, clicks or pedal edema. Telemetry: Sinus rhythm with BBB morphology.  Gastrointestinal system: Abdomen is nondistended, soft and nontender. Normal bowel sounds heard. No organomegaly or masses appreciated. Pierced umbilicus.  Central nervous system: Alert and oriented. No focal neurological deficits.  Extremities: Symmetric 5 x 5 power. Peripheral pulses symmetrically felt.   Skin: No rashes or acute findings.  Musculoskeletal system: Tenderness left upper intrascapular region with some muscle tightness.  Psychiatry: Pleasant and cooperative.   Labs on Admission:  Basic Metabolic Panel:  Recent Labs Lab 09/24/15 1104  NA 134*  K 4.4  CL 100*  CO2 26  GLUCOSE 131*  BUN 10  CREATININE 0.84  CALCIUM 8.8*   Liver Function Tests: No results for input(s): AST, ALT, ALKPHOS, BILITOT, PROT, ALBUMIN in the last 168 hours. No results for input(s): LIPASE, AMYLASE in the last 168 hours. No results for input(s): AMMONIA in the last 168 hours. CBC:  Recent Labs Lab 09/24/15 1104  WBC 14.9*  NEUTROABS 10.9*  HGB 13.6  HCT 41.0  MCV 92.3  PLT 292   Cardiac Enzymes:  Recent Labs Lab 09/24/15 1104  TROPONINI <0.03    BNP (last 3 results) No results for input(s): PROBNP in the last 8760 hours. CBG: No results for input(s): GLUCAP in the last 168 hours.  Radiological Exams on Admission: Dg Chest 2 View  09/24/2015  CLINICAL DATA:  Chest pain that goes into left shoulder with sob since yesterday/smoker EXAM: CHEST  2 VIEW COMPARISON:  01/01/2009 FINDINGS:  Cardiac silhouette is mildly enlarged. No mediastinal or hilar masses or evidence of adenopathy. Mild linear subsegmental atelectasis noted at the lung bases. No lung consolidation to suggest pneumonia. No pulmonary edema. No pleural effusion or pneumothorax. Skeletal structures are unremarkable. IMPRESSION: No acute cardiopulmonary disease. Electronically Signed   By: Amie Portland M.D.   On: 09/24/2015 12:16   Ct Angio Chest Pe W/cm &/or Wo Cm  09/24/2015  CLINICAL DATA:  Chest pain and shortness of breath. EXAM: CT ANGIOGRAPHY CHEST WITH CONTRAST TECHNIQUE: Multidetector CT imaging of the chest was performed using the standard protocol during bolus administration of intravenous contrast. Multiplanar CT image reconstructions and MIPs were obtained to evaluate the vascular anatomy. CONTRAST:  OMNIPAQUE IOHEXOL 350 MG/ML SOLN COMPARISON:  Chest x-ray 09/24/2015 FINDINGS: There are no pulmonary emboli. There is a prominent pericardial effusion. Multiple slightly prominent lymph nodes in the mediastinum, primarily in the aortopulmonary window with the largest transverse diameter being 12 mm. Slight emphysematous blebs at the lung apices. 4 mm nodule in the left lung apex  on image 16 of series 4. Small intrapulmonary node along the minor fissure on image 53 of series 4. Slight atelectasis or scarring at the lung base. No osseous abnormality. The visualized portion of the upper abdomen is normal. Review of the MIP images confirms the above findings. IMPRESSION: 1. No pulmonary emboli. 2. Prominent pericardial effusion. No appreciable compression of the heart. 3. Slight emphysematous disease at the apices. 4. 4 mm nodule in the left lung apex. If the patient is at high risk for bronchogenic carcinoma, follow-up chest CT at 1 year is recommended. If the patient is at low risk, no follow-up is needed. This recommendation follows the consensus statement: Guidelines for Management of Small Pulmonary Nodules  Detected on CT Scans: A Statement from the Fleischner Society as published in Radiology 2005; 237:395-400. Electronically Signed   By: Francene Boyers M.D.   On: 09/24/2015 13:37    EKG: Independently reviewed. Sinus rhythm with LBBB (new compared to 2010).  Assessment/Plan Principal Problem:   Chest pain, muscular Active Problems:   Pericardial effusion   Hypothyroidism   Hyperlipidemia, mixed   Tobacco abuse   Alcohol use (HCC)   Mid back pain on left side   Atypical chest pain/likely musculoskeletal chest pain - Other DD: Rule out ACS (new LBBB), pericarditis (pericardial effusion by CT) versus other etiology - Admit to telemetry for observation and evaluation - Cycle troponins. Obtain 2-D echo - Continue aspirin and statins - Cardiology consulted by EDP-await recommendations. - Treat for muscular/chest wall pain with NSAIDs, muscle relaxants and topical heat - Monitor - 2-D echo March 2010 showed LVEF 60% and no pericardial effusion.  Mixed hyperlipidemia - Continue home statins  Hypothyroid - Continue Synthroid. Check TSH  Tobacco abuse - Cessation counseled. Nicotine patch as per patient's request.  Alcohol use/? Abuse - Placed on CIWA protocol.  Pericardial effusion - No clinical features to suggest tamponade. Unclear etiology. Follow 2-D echo. Await cardiology recommendations.  Left upper lobe pulmonary nodule - As per radiology recommendations, follow-up CT in 1 year.  Leukocytosis - No clinical suspicion for sepsis.? Stress response. Follow CBC in a.m.    DVT prophylaxis: Lovenox Code Status: Full  Family Communication: Discussed with patient's spouse at bedside on 11/18  Disposition Plan: DC home, possibly 11/19 pending medical stability and negative workup.   Time spent: 60 minutes.  Marcellus Scott, MD, FACP, FHM. [facility] Pager [phone]  If 7PM-7AM, please contact night-coverage [email or web address] Password [email or web address] 09/24/2015, 2:52  PM

## 2015-09-25 DIAGNOSIS — R0789 Other chest pain: Secondary | ICD-10-CM | POA: Diagnosis not present

## 2015-09-25 DIAGNOSIS — Z789 Other specified health status: Secondary | ICD-10-CM | POA: Diagnosis not present

## 2015-09-25 DIAGNOSIS — E782 Mixed hyperlipidemia: Secondary | ICD-10-CM | POA: Diagnosis not present

## 2015-09-25 DIAGNOSIS — I319 Disease of pericardium, unspecified: Secondary | ICD-10-CM | POA: Diagnosis not present

## 2015-09-25 LAB — HEMOGLOBIN A1C
Hgb A1c MFr Bld: 5.8 % — ABNORMAL HIGH (ref 4.8–5.6)
Mean Plasma Glucose: 120 mg/dL

## 2015-09-25 LAB — TROPONIN I: Troponin I: <0.03 ng/mL (ref <0.031)

## 2015-09-25 MED ORDER — ADULT MULTIVITAMIN W/MINERALS CH: 1.0000 | ORAL_TABLET | Freq: Every day | ORAL | Status: DC

## 2015-09-25 MED ORDER — FOLIC ACID 1 MG PO TABS: 1.0000 mg | ORAL_TABLET | Freq: Every day | ORAL | Status: DC

## 2015-09-25 MED ORDER — NAPROXEN SODIUM 220 MG PO TABS: 220.0000 mg | ORAL_TABLET | Freq: Two times a day (BID) | ORAL | Status: DC | PRN

## 2015-09-25 MED ORDER — THIAMINE HCL 100 MG PO TABS: 100.0000 mg | ORAL_TABLET | Freq: Every day | ORAL | Status: DC

## 2015-09-25 MED ORDER — NICOTINE 21 MG/24HR TD PT24: 21.0000 mg | MEDICATED_PATCH | Freq: Every day | TRANSDERMAL | Status: DC

## 2015-09-25 NOTE — Discharge Summary (Signed)
Physician Discharge Summary  Whitney May:[id number] DOB: [birth year] DOA: 09/24/2015  PCP: Toma Deiters, MD  Admit date: 09/24/2015 Discharge date: 09/25/2015  Time spent: Less than 30 minutes  Recommendations for Outpatient Follow-up:  1. Dr. Lia Hopping, PCP in 3 days with repeat labs (CBC & BMP) 2. Dr. Prentice Docker, Cardiology: Call office for appointment on Monday 09/27/2015 for follow-up and outpatient stress test. 3. Recommend follow-up chest CT in 1 year as per radiologist recommendation, to follow-up on lung nodule.  Discharge Diagnoses:  Principal Problem:   Chest pain, muscular Active Problems:   Pericardial effusion   Hypothyroidism   Hyperlipidemia, mixed   Tobacco abuse   Alcohol use (HCC)   Mid back pain on left side   Discharge Condition: Improved & Stable  Diet recommendation: Heart healthy diet.  Filed Weights   09/24/15 1042  Weight: 60.328 kg (133 lb)    History of present illness:  54 y.o. female , married, independent of activities of daily living, working, history of HLD, hypothyroid, tobacco abuse, alcohol use, presented to the [facility] ED on 09/24/15 with complaints of back pain and chest pain. She was in her usual state of health until 6 PM on 11/17 and started experiencing back pain which seemed to start in the right scapular region, then went up to left upper scapular region. This was associated with pain of left upper arm and mid sternum. Described as aching in nature. Rated at moderate severity when taking deep breaths or chest wall movements or coughing or sneezing. Has chronic nonproductive smoker's cough. Noted some dyspnea on exertion today while at work. Denies fever or chills. No recent long distance travel. Does lift heavy stuff at work but does not think that she sustained a sprain. No direct trauma. States that she had done a stress test for dizziness several years ago-cannot remember results. History of heart disease  in grandmother who died at age 69 and aunt had CABG at unknown age. In the ED, CTA chest negative for PE but shows prominent pericardial effusion and a pulmonary nodule, troponin 1, EKG shows LBBB which is new compared to EKGs in 2010. Cardiology consulted by EDP. Hospitalist admission was requested.  Hospital Course:   Atypical chest pain/likely musculoskeletal chest pain - Other DD: ACS (new LBBB)-ruled out, pericarditis (pericardial effusion by CT)-seems less likely (chest pain not typical) versus other etiology - Troponin 4 negative. - 2-D echo results as below. - Treated for muscular/chest wall pain with NSAIDs, muscle relaxants and topical heat. Significantly improved. - Cardiology consulted: Recommended that if all troponins are normal, can proceed with outpatient Lexiscan Cardiolite stress test and outpatient follow-up which cardiologist will arrange - Patient advised to take NSAIDs twice a day scheduled for 2-3 days and then as needed. Back pain has significantly improved. Patient does not have anterior chest pain unless she takes very deep breaths. No dyspnea reported.  Mixed hyperlipidemia - Continue home statins  Hypothyroid - Continue Synthroid. TSH: 4.088.  Tobacco abuse - Cessation counseled. Nicotine patch as per patient's request.  Alcohol use/? Abuse - No overt features of withdrawal. Moderation/abstinence counseled. Continue folate, thiamine and multivitamins.  Pericardial effusion - No clinical features to suggest tamponade. Unclear etiology. 2-D echo results as below which shows mild to moderate pericardial effusion without tamponade physiology.   Left upper lobe pulmonary nodule - As per radiology recommendations, follow-up CT in 1 year. Patient was counseled regarding outpatient follow-up with PCP about this and she verbalized  understanding. Tobacco cessation counseled.  Leukocytosis - No clinical suspicion for sepsis.? Stress response. Follow CBC with PCP in a  couple of days.    Consultations:  Cardiology   Procedures:  2-D echo 09/24/15: Study Conclusions  - Left ventricle: The cavity size was normal. Wall thickness was increased in a pattern of mild LVH. Systolic function was normal. The estimated ejection fraction was in the range of 60% to 65%. Wall motion was normal; there were no regional wall motion abnormalities. Features are consistent with a pseudonormal left ventricular filling pattern, with concomitant abnormal relaxation and increased filling pressure (grade 2 diastolic dysfunction). Indeterminate filling pressures. - Ventricular septum: Septal motion showed abnormal function and dyssynergy. These changes are consistent with a left bundle branch block. - Pericardium, extracardiac: Small to medium size pericardial effusion (largest diameter 1.46 cm), without features of tamponade.   Discharge Exam:  Complaints: Back pain has significantly improved. Patient does not have anterior chest pain unless she takes very deep breaths. No dyspnea reported.  Filed Vitals:   09/24/15 1601 09/24/15 1655 09/24/15 2214 09/25/15 0603  BP: 94/56 111/64 105/66 113/72  Pulse: 84 92 78 66  Temp:  99.1 F (37.3 C) 98.1 F (36.7 C) 97.6 F (36.4 C)  TempSrc:  Oral Oral Oral  Resp: 22 20 20 20   Height:      Weight:      SpO2: 96% 95% 95% 95%    General exam: Pleasant middle-aged female sitting up comfortably in bed this morning.  Respiratory system: Clear. No increased work of breathing. No reproducible chest wall pain.  Cardiovascular system: S1 & S2 heard, RRR. No JVD, murmurs, gallops, clicks or pedal edema. No pericardial rub appreciated. Heart sounds do not sound muffled. Gastrointestinal system: Abdomen is nondistended, soft and nontender. Normal bowel sounds heard. Central nervous system: Alert and oriented. No focal neurological deficits. Extremities: Symmetric 5 x 5 power.  Discharge  Instructions      Discharge Instructions    Call MD for:  difficulty breathing, headache or visual disturbances    Complete by:  As directed      Call MD for:  extreme fatigue    Complete by:  As directed      Call MD for:  persistant dizziness or light-headedness    Complete by:  As directed      Call MD for:  severe uncontrolled pain    Complete by:  As directed      Diet - low sodium heart healthy    Complete by:  As directed      Increase activity slowly    Complete by:  As directed             Medication List    STOP taking these medications        aspirin 325 MG tablet     QC BACKACHE RELIEF MAX ST PO      TAKE these medications        clobetasol cream 0.05 %  Commonly known as:  TEMOVATE  Apply 1 application topically daily.     folic acid 1 MG tablet  Commonly known as:  FOLVITE  Take 1 tablet (1 mg total) by mouth daily.     hydroxypropyl methylcellulose / hypromellose 2.5 % ophthalmic solution  Commonly known as:  ISOPTO TEARS / GONIOVISC  Place 1 drop into both eyes as needed for dry eyes.     levothyroxine 112 MCG tablet  Commonly known as:  SYNTHROID, LEVOTHROID  Take 1 tablet by mouth daily.     multivitamin with minerals Tabs tablet  Take 1 tablet by mouth daily.     naproxen sodium 220 MG tablet  Commonly known as:  ANAPROX  Take 1 tablet (220 mg total) by mouth 2 (two) times daily as needed (pain).     nicotine 21 mg/24hr patch  Commonly known as:  NICODERM CQ - dosed in mg/24 hours  Place 1 patch (21 mg total) onto the skin daily.     rosuvastatin 10 MG tablet  Commonly known as:  CRESTOR  Take 1 tablet by mouth daily.     thiamine 100 MG tablet  Take 1 tablet (100 mg total) by mouth daily.       Follow-up Information    Follow up with Toma Deiters, MD. Schedule an appointment as soon as possible for a visit in 3 days.   Specialty:  Internal Medicine   Why:  To be seen with repeat labs (CBC & BMP).   Contact information:    [address] [place] [address] [phone]       Schedule an appointment as soon as possible for a visit with Laqueta Linden, MD.   Specialty:  Cardiology   Why:  Call office on Monday 09/27/2015 for appointment for follow-up and outpatient heart stress test.    Contact information:   [address] [place] [address] [phone]        The results of significant diagnostics from this hospitalization (including imaging, microbiology, ancillary and laboratory) are listed below for reference.    Significant Diagnostic Studies: Dg Chest 2 View  09/24/2015  CLINICAL DATA:  Chest pain that goes into left shoulder with sob since yesterday/smoker EXAM: CHEST  2 VIEW COMPARISON:  01/01/2009 FINDINGS: Cardiac silhouette is mildly enlarged. No mediastinal or hilar masses or evidence of adenopathy. Mild linear subsegmental atelectasis noted at the lung bases. No lung consolidation to suggest pneumonia. No pulmonary edema. No pleural effusion or pneumothorax. Skeletal structures are unremarkable. IMPRESSION: No acute cardiopulmonary disease. Electronically Signed   By: Amie Portland M.D.   On: 09/24/2015 12:16   Ct Angio Chest Pe W/cm &/or Wo Cm  09/24/2015  CLINICAL DATA:  Chest pain and shortness of breath. EXAM: CT ANGIOGRAPHY CHEST WITH CONTRAST TECHNIQUE: Multidetector CT imaging of the chest was performed using the standard protocol during bolus administration of intravenous contrast. Multiplanar CT image reconstructions and MIPs were obtained to evaluate the vascular anatomy. CONTRAST:  OMNIPAQUE IOHEXOL 350 MG/ML SOLN COMPARISON:  Chest x-ray 09/24/2015 FINDINGS: There are no pulmonary emboli. There is a prominent pericardial effusion. Multiple slightly prominent lymph nodes in the mediastinum, primarily in the aortopulmonary window with the largest transverse diameter being 12 mm. Slight emphysematous blebs at the lung apices. 4 mm nodule in the left lung apex on  image 16 of series 4. Small intrapulmonary node along the minor fissure on image 53 of series 4. Slight atelectasis or scarring at the lung base. No osseous abnormality. The visualized portion of the upper abdomen is normal. Review of the MIP images confirms the above findings. IMPRESSION: 1. No pulmonary emboli. 2. Prominent pericardial effusion. No appreciable compression of the heart. 3. Slight emphysematous disease at the apices. 4. 4 mm nodule in the left lung apex. If the patient is at high risk for bronchogenic carcinoma, follow-up chest CT at 1 year is recommended. If the patient is at low risk, no follow-up  is needed. This recommendation follows the consensus statement: Guidelines for Management of Small Pulmonary Nodules Detected on CT Scans: A Statement from the Fleischner Society as published in Radiology 2005; 237:395-400. Electronically Signed   By: Francene Boyers M.D.   On: 09/24/2015 13:37    Microbiology: No results found for this or any previous visit (from the past 240 hour(s)).   Labs: Basic Metabolic Panel:  Recent Labs Lab 09/24/15 1104  NA 134*  K 4.4  CL 100*  CO2 26  GLUCOSE 131*  BUN 10  CREATININE 0.84  CALCIUM 8.8*   Liver Function Tests: No results for input(s): AST, ALT, ALKPHOS, BILITOT, PROT, ALBUMIN in the last 168 hours. No results for input(s): LIPASE, AMYLASE in the last 168 hours. No results for input(s): AMMONIA in the last 168 hours. CBC:  Recent Labs Lab 09/24/15 1104  WBC 14.9*  NEUTROABS 10.9*  HGB 13.6  HCT 41.0  MCV 92.3  PLT 292   Cardiac Enzymes:  Recent Labs Lab 09/24/15 1104 09/24/15 1740 09/24/15 2342 09/25/15 0518  TROPONINI <0.03 <0.03 <0.03 <0.03   BNP: BNP (last 3 results) No results for input(s): BNP in the last 8760 hours.  ProBNP (last 3 results) No results for input(s): PROBNP in the last 8760 hours.  CBG: No results for input(s): GLUCAP in the last 168 hours.      Signed:  Marcellus Scott, MD,  FACP, FHM. [facility] Pager [phone]  If 7PM-7AM, please contact night-coverage [email or web address] Password [email or web address] 09/25/2015, 2:16 PM

## 2015-09-25 NOTE — Progress Notes (Signed)
Late Entry for 09/25/2015   Approx 1000 the patient called me to the room to check on her discharge paper work.  I voiced to her that the MD had not put the paper work in yet that yesterday it was around lunch or so before he placed the orders.  She stated that she was not going to wait that long.  I voiced to her that I would continue to look for the paper work.  Approx 12 noon the patient was seen leaving the room with her family without her paper work.   Approx 1345  The MD notified me wanting to know where the patient had went.  Voiced to him that the patient did not want to wait on her discharge this afternoon she wanted to leave.  He inquired if I had given her AMA papers and I told him that I had not since he had said he was gong to d/c the patient.  He stated that I needed to call her and ask if she would come back and sign her d/c papers or he  Will not be able to d/c it would be AMA.  I called her husband answered the phone and said she was sleeping and stated he would come back and sign the D/C papers.  I notified Dr. Waymon Amato.  Her husband arrived on the unit to sign her discharge instructions.  He verbalized understanding.

## 2015-09-25 NOTE — Progress Notes (Signed)
Addendum  During my rounds this am ~ b/w 8 - 8:30 AM, I informed the patient that I was going to review her records and would possibly discharge her later today. Discussed with her and her family member at bedside, at length. After speaking with the patient and family, I proceeded to inform patient's RN Ms. Zannie Kehr that I planned to discharge patient later today and would provide her with discharge medications and a letter to take to work. RN verbalized understanding.  Subsequently, when I went back to formally discharge the patient at approximately 1:45 PM, patient had already been removed from Epic. I called the nursing station and was informed that patient had been discharged. I was not able to immediately reached the patient's RN. Discussed with charge nurse and informed her that I had not placed a discharge order nor reconciled patient's medications and didn't know how patient had been discharged home. Charge nurse Shanda Bumps informed me that she would investigate the matter.  Patient's RN then called me and stated that patient did not want to wait and left the hospital. RN however did not inform me at that time (until I called to find out whereabouts of the patient) nor did she have patient sign AMA paperwork. At this time I requested her to call patient/family who were willing to come back and pick up discharge paperwork. Completed discharge paperwork.  Marcellus Scott, MD, FACP, FHM. [facility] Pager [phone]  If 7PM-7AM, please contact night-coverage [email or web address] Password [email or web address] 09/25/2015, 2:40 PM

## 2015-09-25 NOTE — Discharge Instructions (Signed)
Nonspecific Chest Pain  Chest pain can be caused by many different conditions. There is always a chance that your pain could be related to something serious, such as a heart attack or a blood clot in your lungs. Chest pain can also be caused by conditions that are not life-threatening. If you have chest pain, it is very important to follow up with your health care provider. CAUSES  Chest pain can be caused by:  Heartburn.  Pneumonia or bronchitis.  Anxiety or stress.  Inflammation around your heart (pericarditis) or lung (pleuritis or pleurisy).  A blood clot in your lung.  A collapsed lung (pneumothorax). It can develop suddenly on its own (spontaneous pneumothorax) or from trauma to the chest.  Shingles infection (varicella-zoster virus).  Heart attack.  Damage to the bones, muscles, and cartilage that make up your chest wall. This can include:  Bruised bones due to injury.  Strained muscles or cartilage due to frequent or repeated coughing or overwork.  Fracture to one or more ribs.  Sore cartilage due to inflammation (costochondritis). RISK FACTORS  Risk factors for chest pain may include:  Activities that increase your risk for trauma or injury to your chest.  Respiratory infections or conditions that cause frequent coughing.  Medical conditions or overeating that can cause heartburn.  Heart disease or family history of heart disease.  Conditions or health behaviors that increase your risk of developing a blood clot.  Having had chicken pox (varicella zoster). SIGNS AND SYMPTOMS Chest pain can feel like:  Burning or tingling on the surface of your chest or deep in your chest.  Crushing, pressure, aching, or squeezing pain.  Dull or sharp pain that is worse when you move, cough, or take a deep breath.  Pain that is also felt in your back, neck, shoulder, or arm, or pain that spreads to any of these areas. Your chest pain may come and go, or it may stay  constant. DIAGNOSIS Lab tests or other studies may be needed to find the cause of your pain. Your health care provider may have you take a test called an ambulatory ECG (electrocardiogram). An ECG records your heartbeat patterns at the time the test is performed. You may also have other tests, such as:  Transthoracic echocardiogram (TTE). During echocardiography, sound waves are used to create a picture of all of the heart structures and to look at how blood flows through your heart.  Transesophageal echocardiogram (TEE).This is a more advanced imaging test that obtains images from inside your body. It allows your health care provider to see your heart in finer detail.  Cardiac monitoring. This allows your health care provider to monitor your heart rate and rhythm in real time.  Holter monitor. This is a portable device that records your heartbeat and can help to diagnose abnormal heartbeats. It allows your health care provider to track your heart activity for several days, if needed.  Stress tests. These can be done through exercise or by taking medicine that makes your heart beat more quickly.  Blood tests.  Imaging tests. TREATMENT  Your treatment depends on what is causing your chest pain. Treatment may include:  Medicines. These may include:  Acid blockers for heartburn.  Anti-inflammatory medicine.  Pain medicine for inflammatory conditions.  Antibiotic medicine, if an infection is present.  Medicines to dissolve blood clots.  Medicines to treat coronary artery disease.  Supportive care for conditions that do not require medicines. This may include:  Resting.  Applying heat  or cold packs to injured areas.  Limiting activities until pain decreases. HOME CARE INSTRUCTIONS  If you were prescribed an antibiotic medicine, finish it all even if you start to feel better.  Avoid any activities that bring on chest pain.  Do not use any tobacco products, including  cigarettes, chewing tobacco, or electronic cigarettes. If you need help quitting, ask your health care provider.  Do not drink alcohol.  Take medicines only as directed by your health care provider.  Keep all follow-up visits as directed by your health care provider. This is important. This includes any further testing if your chest pain does not go away.  If heartburn is the cause for your chest pain, you may be told to keep your head raised (elevated) while sleeping. This reduces the chance that acid will go from your stomach into your esophagus.  Make lifestyle changes as directed by your health care provider. These may include:  Getting regular exercise. Ask your health care provider to suggest some activities that are safe for you.  Eating a heart-healthy diet. A registered dietitian can help you to learn healthy eating options.  Maintaining a healthy weight.  Managing diabetes, if necessary.  Reducing stress. SEEK MEDICAL CARE IF:  Your chest pain does not go away after treatment.  You have a rash with blisters on your chest.  You have a fever. SEEK IMMEDIATE MEDICAL CARE IF:   Your chest pain is worse.  You have an increasing cough, or you cough up blood.  You have severe abdominal pain.  You have severe weakness.  You faint.  You have chills.  You have sudden, unexplained chest discomfort.  You have sudden, unexplained discomfort in your arms, back, neck, or jaw.  You have shortness of breath at any time.  You suddenly start to sweat, or your skin gets clammy.  You feel nauseous or you vomit.  You suddenly feel light-headed or dizzy.  Your heart begins to beat quickly, or it feels like it is skipping beats. These symptoms may represent a serious problem that is an emergency. Do not wait to see if the symptoms will go away. Get medical help right away. Call your local emergency services (911 in the [place] Do not drive yourself to the hospital.   This  information is not intended to replace advice given to you by your health care provider. Make sure you discuss any questions you have with your health care provider.   Document Released: 08/02/2005 Document Revised: 11/13/2014 Document Reviewed: 05/29/2014 Elsevier Interactive Patient Education 2016 Elsevier Inc[employer]  Back Pain, Adult Back pain is very common in adults.The cause of back pain is rarely dangerous and the pain often gets better over time.The cause of your back pain may not be known. Some common causes of back pain include:  Strain of the muscles or ligaments supporting the spine.  Wear and tear (degeneration) of the spinal disks.  Arthritis.  Direct injury to the back. For many people, back pain may return. Since back pain is rarely dangerous, most people can learn to manage this condition on their own. HOME CARE INSTRUCTIONS Watch your back pain for any changes. The following actions may help to lessen any discomfort you are feeling:  Remain active. It is stressful on your back to sit or stand in one place for long periods of time. Do not sit, drive, or stand in one place for more than 30 minutes at a time. Take short walks on even surfaces as  soon as you are able.Try to increase the length of time you walk each day.  Exercise regularly as directed by your health care provider. Exercise helps your back heal faster. It also helps avoid future injury by keeping your muscles strong and flexible.  Do not stay in bed.Resting more than 1-2 days can delay your recovery.  Pay attention to your body when you bend and lift. The most comfortable positions are those that put less stress on your recovering back. Always use proper lifting techniques, including:  Bending your knees.  Keeping the load close to your body.  Avoiding twisting.  Find a comfortable position to sleep. Use a firm mattress and lie on your side with your knees slightly bent. If you lie on your back, put a  pillow under your knees.  Avoid feeling anxious or stressed.Stress increases muscle tension and can worsen back pain.It is important to recognize when you are anxious or stressed and learn ways to manage it, such as with exercise.  Take medicines only as directed by your health care provider. Over-the-counter medicines to reduce pain and inflammation are often the most helpful.Your health care provider may prescribe muscle relaxant drugs.These medicines help dull your pain so you can more quickly return to your normal activities and healthy exercise.  Apply ice to the injured area:  Put ice in a plastic bag.  Place a towel between your skin and the bag.  Leave the ice on for 20 minutes, 2-3 times a day for the first 2-3 days. After that, ice and heat may be alternated to reduce pain and spasms.  Maintain a healthy weight. Excess weight puts extra stress on your back and makes it difficult to maintain good posture. SEEK MEDICAL CARE IF:  You have pain that is not relieved with rest or medicine.  You have increasing pain going down into the legs or buttocks.  You have pain that does not improve in one week.  You have night pain.  You lose weight.  You have a fever or chills. SEEK IMMEDIATE MEDICAL CARE IF:   You develop new bowel or bladder control problems.  You have unusual weakness or numbness in your arms or legs.  You develop nausea or vomiting.  You develop abdominal pain.  You feel faint.   This information is not intended to replace advice given to you by your health care provider. Make sure you discuss any questions you have with your health care provider.   Document Released: 10/23/2005 Document Revised: 11/13/2014 Document Reviewed: 02/24/2014 Elsevier Interactive Patient Education 2016 Elsevier Inc.   Pericardial Effusion Pericardial effusion is a buildup of fluid around your heart. The heart is surrounded by a double-layered sac (pericardium). This sac  normally contains a small amount of fluid. When too much builds up, it can put pressure on your heart and cause problems. As fluid builds up in the pericardial sac and pressure on your heart increases, it becomes harder for your heart to pump blood. When fluid prevents your heart from pumping enough blood, it is called cardiac tamponade. Cardiac tamponade is a life-threatening condition. CAUSES  Often the cause of pericardial effusion is not known (idiopathic effusion). Possible causes are from:  Infections, such as from a virus, bacteria, fungus, or parasite.  Damage to the pericardium from heart surgery or a heart attack.  Inflammatory diseases, such as rheumatoid arthritis or lupus.  Kidney disease.  Thyroid disease.  Cancer.  Cancer treatment, including radiation or chemotherapy.  Certain drugs,  including tuberculosis drugs or seizure drugs.  Chest trauma. SIGNS AND SYMPTOMS  Pericardial effusion may not cause symptoms at first, especially if the fluid builds up slowly. In time, pressure on the heart may cause:  Chest pain.  Trouble breathing.  Pain and shortness of breath that is worse when lying down.  Dizziness.  Fainting.  Cough.  Hiccups.  Skipped heartbeats (palpitations).  Anxiety and confusion.  A bluish skin color (cyanosis).  Swollen legs and ankles. DIAGNOSIS  Your health care provider may suspect pericardial effusion based on your symptoms. Your health care provider may also do a physical exam to check for:  Low blood pressure and weak pulses.  Soft (muffled) heart sounds.  Rapid heartbeat.  Full veins in your neck (distended jugular veins).  Decreased breathing sounds and a rubbing sound (friction rub) when listening to your lungs. Your health care provider may also do several tests to confirm the diagnosis and find out what is causing the pericardial effusion. These may include:  Chest X-ray.  Imaging study of the heart using sound waves  (echocardiogram).  CT scan or MRI.  Electrical study of the heart (electrocardiogram [ECG]).  A procedure using a needle to remove fluid from the pericardium for examination (pericardiocentesis).  Blood tests to check for:  Infection.  Heart damage.  Thyroid abnormalities.  Kidney disease.  Inflammatory disorders. TREATMENT  Treatment for pericardial effusion depends on the cause of your symptoms and how severe your symptoms are. If a specific cause was found, that cause will be treated. Treatment may include:  Medicines, such as:  Nonsteroidal anti-inflammatory drugs (NSAIDs).  Other anti-inflammatory drugs, such as steroids.  Antibiotic medicine.  Antifungal medicine.  Hospital treatment may be necessary, such as for cardiac tamponade. This may include:  Intravenous (IV) fluids.  Breathing support.  Surgery may be needed in severe cases. Surgery may include:  Pericardiocentesis.  Open heart surgery.  A procedure to make a permanent opening in the pericardium (pericardial window). SEEK MEDICAL CARE IF:  You feel dizzy or light-headed.  You have swelling in your legs or ankles.  You have heart palpitations.  You have persistent cough or hiccups. SEEK IMMEDIATE MEDICAL CARE IF:   You faint.  You have chest pain.  You have trouble breathing. These symptoms may represent a serious problem that is an emergency. Do not wait to see if the symptoms will go away. Get medical help right away. Call your local emergency services (911 in the [place] Do not drive yourself to the hospital. MAKE SURE YOU:  Understand these instructions.  Will watch your condition.  Will get help right away if you are not doing well or get worse.   This information is not intended to replace advice given to you by your health care provider. Make sure you discuss any questions you have with your health care provider.   Document Released: 06/20/2005 Document Revised: 11/13/2014  Document Reviewed: 03/12/2014 Elsevier Interactive Patient Education [employer]

## 2015-09-27 ENCOUNTER — Other Ambulatory Visit: Payer: Self-pay | Admitting: *Deleted

## 2015-09-27 ENCOUNTER — Encounter: Payer: Self-pay | Admitting: Cardiovascular Disease

## 2015-09-27 ENCOUNTER — Telehealth: Payer: Self-pay | Admitting: Cardiovascular Disease

## 2015-09-27 DIAGNOSIS — R079 Chest pain, unspecified: Secondary | ICD-10-CM

## 2015-09-27 NOTE — Telephone Encounter (Signed)
Whitney May set for 21/1/16 @ [facility] Checking percert

## 2015-10-06 NOTE — Telephone Encounter (Signed)
approved

## 2015-10-07 ENCOUNTER — Inpatient Hospital Stay (HOSPITAL_COMMUNITY): Admission: RE | Admit: 2015-10-07 | Payer: BLUE CROSS/BLUE SHIELD | Source: Ambulatory Visit

## 2015-10-07 ENCOUNTER — Encounter (HOSPITAL_COMMUNITY): Payer: BLUE CROSS/BLUE SHIELD

## 2015-10-21 ENCOUNTER — Ambulatory Visit: Payer: BLUE CROSS/BLUE SHIELD | Admitting: Cardiovascular Disease

## 2015-10-29 ENCOUNTER — Inpatient Hospital Stay (HOSPITAL_COMMUNITY): Admission: RE | Admit: 2015-10-29 | Payer: BLUE CROSS/BLUE SHIELD | Source: Ambulatory Visit

## 2015-10-29 ENCOUNTER — Encounter (HOSPITAL_COMMUNITY): Payer: Self-pay

## 2015-10-29 ENCOUNTER — Encounter (HOSPITAL_COMMUNITY)
Admission: RE | Admit: 2015-10-29 | Discharge: 2015-10-29 | Disposition: A | Discharge status: Home / Self Care | Payer: BLUE CROSS/BLUE SHIELD | Source: Ambulatory Visit | Attending: Cardiovascular Disease | Admitting: Cardiovascular Disease

## 2015-10-29 DIAGNOSIS — R079 Chest pain, unspecified: Secondary | ICD-10-CM | POA: Diagnosis not present

## 2015-10-29 LAB — NM MYOCAR MULTI W/SPECT W/WALL MOTION / EF
CHL CUP RESTING HR STRESS: 81 {beats}/min
CSEPPHR: 113 {beats}/min

## 2015-10-29 MED ORDER — SODIUM CHLORIDE 0.9 % IJ SOLN
INTRAMUSCULAR | Status: AC
  Administered 2015-10-29: 10 mL via INTRAVENOUS
  Filled 2015-10-29: qty 3

## 2015-10-29 MED ORDER — TECHNETIUM TC 99M SESTAMIBI - CARDIOLITE
30.0000 | Freq: Once | INTRAVENOUS | Status: AC | PRN
  Administered 2015-10-29: 30 via INTRAVENOUS

## 2015-10-29 MED ORDER — TECHNETIUM TC 99M SESTAMIBI GENERIC - CARDIOLITE
10.0000 | Freq: Once | INTRAVENOUS | Status: AC | PRN
  Administered 2015-10-29: 10 via INTRAVENOUS

## 2015-10-29 MED ORDER — REGADENOSON 0.4 MG/5ML IV SOLN
INTRAVENOUS | Status: AC
  Administered 2015-10-29: 0.4 mg via INTRAVENOUS
  Filled 2015-10-29: qty 5

## 2015-11-02 ENCOUNTER — Telehealth: Payer: Self-pay | Admitting: *Deleted

## 2015-11-02 NOTE — Telephone Encounter (Signed)
Notes Recorded by Lesle Chris, LPN on 09/31/1216 at 1:46 PM No answer.

## 2015-11-02 NOTE — Telephone Encounter (Signed)
Notes Recorded by Lesle Chris, LPN on 62/83/1517 at 3:48 PM Patient notified. Copy to pmd.

## 2015-11-02 NOTE — Telephone Encounter (Signed)
-----   Message from Laqueta Linden, MD sent at 11/01/2015  9:13 AM EST ----- Normal.

## 2016-03-10 DIAGNOSIS — J069 Acute upper respiratory infection, unspecified: Secondary | ICD-10-CM | POA: Diagnosis not present

## 2016-04-17 DIAGNOSIS — Z1231 Encounter for screening mammogram for malignant neoplasm of breast: Secondary | ICD-10-CM | POA: Diagnosis not present

## 2016-05-19 DIAGNOSIS — E784 Other hyperlipidemia: Secondary | ICD-10-CM | POA: Diagnosis not present

## 2016-05-19 DIAGNOSIS — R635 Abnormal weight gain: Secondary | ICD-10-CM | POA: Diagnosis not present

## 2016-05-19 DIAGNOSIS — E039 Hypothyroidism, unspecified: Secondary | ICD-10-CM | POA: Diagnosis not present

## 2016-05-19 DIAGNOSIS — F17210 Nicotine dependence, cigarettes, uncomplicated: Secondary | ICD-10-CM | POA: Diagnosis not present

## 2016-05-19 DIAGNOSIS — M25562 Pain in left knee: Secondary | ICD-10-CM | POA: Diagnosis not present

## 2016-08-17 DIAGNOSIS — Z23 Encounter for immunization: Secondary | ICD-10-CM | POA: Diagnosis not present

## 2016-09-01 DIAGNOSIS — E038 Other specified hypothyroidism: Secondary | ICD-10-CM | POA: Diagnosis not present

## 2016-09-01 DIAGNOSIS — F3289 Other specified depressive episodes: Secondary | ICD-10-CM | POA: Diagnosis not present

## 2016-09-01 DIAGNOSIS — Z6824 Body mass index (BMI) 24.0-24.9, adult: Secondary | ICD-10-CM | POA: Diagnosis not present

## 2016-09-01 DIAGNOSIS — E784 Other hyperlipidemia: Secondary | ICD-10-CM | POA: Diagnosis not present

## 2016-12-01 DIAGNOSIS — E784 Other hyperlipidemia: Secondary | ICD-10-CM | POA: Diagnosis not present

## 2016-12-01 DIAGNOSIS — F17210 Nicotine dependence, cigarettes, uncomplicated: Secondary | ICD-10-CM | POA: Diagnosis not present

## 2016-12-01 DIAGNOSIS — J219 Acute bronchiolitis, unspecified: Secondary | ICD-10-CM | POA: Diagnosis not present

## 2016-12-15 DIAGNOSIS — F17210 Nicotine dependence, cigarettes, uncomplicated: Secondary | ICD-10-CM | POA: Diagnosis not present

## 2016-12-24 IMAGING — NM NM MYOCAR MULTI W/SPECT W/WALL MOTION & EF
2 series · 12 of 12 positions shown · non-contrast
Comparison: none

[Series 1: rest · 8.28mm/px · 6 of 64 frames shown]
[frame 6/64]
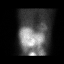
[frame 16/64]
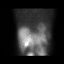
[frame 27/64]
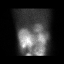
[frame 38/64]
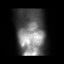
[frame 48/64]
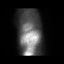
[frame 59/64]
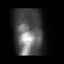

[Series 2: stress gated · 8.28mm/px · 6 of 64 frames shown]
[frame 6/64]
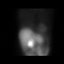
[frame 16/64]
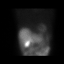
[frame 27/64]
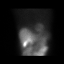
[frame 38/64]
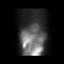
[frame 48/64]
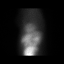
[frame 59/64]
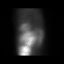

[12 of 12 positions shown; findings below may reference images not displayed]

Canned report from images found in remote index.

Refer to host system for actual result text.

## 2016-12-29 DIAGNOSIS — F17210 Nicotine dependence, cigarettes, uncomplicated: Secondary | ICD-10-CM | POA: Diagnosis not present

## 2017-01-18 DIAGNOSIS — Z01419 Encounter for gynecological examination (general) (routine) without abnormal findings: Secondary | ICD-10-CM | POA: Diagnosis not present

## 2017-01-18 DIAGNOSIS — Z1151 Encounter for screening for human papillomavirus (HPV): Secondary | ICD-10-CM | POA: Diagnosis not present

## 2017-01-18 DIAGNOSIS — F332 Major depressive disorder, recurrent severe without psychotic features: Secondary | ICD-10-CM | POA: Diagnosis not present

## 2017-01-26 DIAGNOSIS — E784 Other hyperlipidemia: Secondary | ICD-10-CM | POA: Diagnosis not present

## 2017-01-26 DIAGNOSIS — F17210 Nicotine dependence, cigarettes, uncomplicated: Secondary | ICD-10-CM | POA: Diagnosis not present

## 2017-01-26 DIAGNOSIS — R634 Abnormal weight loss: Secondary | ICD-10-CM | POA: Diagnosis not present

## 2017-04-10 DIAGNOSIS — J018 Other acute sinusitis: Secondary | ICD-10-CM | POA: Diagnosis not present

## 2017-04-10 DIAGNOSIS — E784 Other hyperlipidemia: Secondary | ICD-10-CM | POA: Diagnosis not present

## 2017-04-10 DIAGNOSIS — F3289 Other specified depressive episodes: Secondary | ICD-10-CM | POA: Diagnosis not present

## 2017-04-10 DIAGNOSIS — E038 Other specified hypothyroidism: Secondary | ICD-10-CM | POA: Diagnosis not present

## 2017-04-11 DIAGNOSIS — E038 Other specified hypothyroidism: Secondary | ICD-10-CM | POA: Diagnosis not present

## 2017-04-11 DIAGNOSIS — E784 Other hyperlipidemia: Secondary | ICD-10-CM | POA: Diagnosis not present

## 2017-06-01 DIAGNOSIS — E784 Other hyperlipidemia: Secondary | ICD-10-CM | POA: Diagnosis not present

## 2017-06-01 DIAGNOSIS — F17210 Nicotine dependence, cigarettes, uncomplicated: Secondary | ICD-10-CM | POA: Diagnosis not present

## 2017-07-20 DIAGNOSIS — R079 Chest pain, unspecified: Secondary | ICD-10-CM | POA: Diagnosis not present

## 2017-07-20 DIAGNOSIS — M2652 Limited mandibular range of motion: Secondary | ICD-10-CM | POA: Diagnosis not present

## 2017-07-20 DIAGNOSIS — F3289 Other specified depressive episodes: Secondary | ICD-10-CM | POA: Diagnosis not present

## 2017-07-20 DIAGNOSIS — R918 Other nonspecific abnormal finding of lung field: Secondary | ICD-10-CM | POA: Diagnosis not present

## 2017-07-20 DIAGNOSIS — E038 Other specified hypothyroidism: Secondary | ICD-10-CM | POA: Diagnosis not present

## 2017-07-20 DIAGNOSIS — E784 Other hyperlipidemia: Secondary | ICD-10-CM | POA: Diagnosis not present

## 2017-08-27 DIAGNOSIS — M79672 Pain in left foot: Secondary | ICD-10-CM | POA: Diagnosis not present

## 2017-08-27 DIAGNOSIS — F3289 Other specified depressive episodes: Secondary | ICD-10-CM | POA: Diagnosis not present

## 2017-08-27 DIAGNOSIS — M722 Plantar fascial fibromatosis: Secondary | ICD-10-CM | POA: Diagnosis not present

## 2017-08-27 DIAGNOSIS — Z6821 Body mass index (BMI) 21.0-21.9, adult: Secondary | ICD-10-CM | POA: Diagnosis not present

## 2017-08-27 DIAGNOSIS — J441 Chronic obstructive pulmonary disease with (acute) exacerbation: Secondary | ICD-10-CM | POA: Diagnosis not present

## 2017-11-28 DIAGNOSIS — J441 Chronic obstructive pulmonary disease with (acute) exacerbation: Secondary | ICD-10-CM | POA: Diagnosis not present

## 2017-11-28 DIAGNOSIS — F3289 Other specified depressive episodes: Secondary | ICD-10-CM | POA: Diagnosis not present

## 2017-11-28 DIAGNOSIS — E7849 Other hyperlipidemia: Secondary | ICD-10-CM | POA: Diagnosis not present

## 2017-11-28 DIAGNOSIS — E039 Hypothyroidism, unspecified: Secondary | ICD-10-CM | POA: Diagnosis not present

## 2017-11-28 DIAGNOSIS — E038 Other specified hypothyroidism: Secondary | ICD-10-CM | POA: Diagnosis not present

## 2017-11-28 DIAGNOSIS — Z6822 Body mass index (BMI) 22.0-22.9, adult: Secondary | ICD-10-CM | POA: Diagnosis not present

## 2017-12-14 DIAGNOSIS — E7849 Other hyperlipidemia: Secondary | ICD-10-CM | POA: Diagnosis not present

## 2017-12-14 DIAGNOSIS — F17210 Nicotine dependence, cigarettes, uncomplicated: Secondary | ICD-10-CM | POA: Diagnosis not present

## 2017-12-14 DIAGNOSIS — E039 Hypothyroidism, unspecified: Secondary | ICD-10-CM | POA: Diagnosis not present

## 2018-01-11 DIAGNOSIS — Z716 Tobacco abuse counseling: Secondary | ICD-10-CM | POA: Diagnosis not present

## 2018-01-11 DIAGNOSIS — F17210 Nicotine dependence, cigarettes, uncomplicated: Secondary | ICD-10-CM | POA: Diagnosis not present

## 2018-01-11 DIAGNOSIS — E039 Hypothyroidism, unspecified: Secondary | ICD-10-CM | POA: Diagnosis not present

## 2018-01-25 DIAGNOSIS — E559 Vitamin D deficiency, unspecified: Secondary | ICD-10-CM | POA: Diagnosis not present

## 2018-01-25 DIAGNOSIS — F17210 Nicotine dependence, cigarettes, uncomplicated: Secondary | ICD-10-CM | POA: Diagnosis not present

## 2018-01-25 DIAGNOSIS — E785 Hyperlipidemia, unspecified: Secondary | ICD-10-CM | POA: Diagnosis not present

## 2018-01-25 DIAGNOSIS — E039 Hypothyroidism, unspecified: Secondary | ICD-10-CM | POA: Diagnosis not present

## 2018-02-02 DIAGNOSIS — H04123 Dry eye syndrome of bilateral lacrimal glands: Secondary | ICD-10-CM | POA: Diagnosis not present

## 2018-02-02 DIAGNOSIS — H40033 Anatomical narrow angle, bilateral: Secondary | ICD-10-CM | POA: Diagnosis not present

## 2018-03-08 DIAGNOSIS — E538 Deficiency of other specified B group vitamins: Secondary | ICD-10-CM | POA: Diagnosis not present

## 2018-03-08 DIAGNOSIS — E559 Vitamin D deficiency, unspecified: Secondary | ICD-10-CM | POA: Diagnosis not present

## 2018-03-08 DIAGNOSIS — E039 Hypothyroidism, unspecified: Secondary | ICD-10-CM | POA: Diagnosis not present

## 2018-03-08 DIAGNOSIS — Z0000 Encounter for general adult medical examination without abnormal findings: Secondary | ICD-10-CM | POA: Diagnosis not present

## 2018-03-18 DIAGNOSIS — F3289 Other specified depressive episodes: Secondary | ICD-10-CM | POA: Diagnosis not present

## 2018-03-18 DIAGNOSIS — E038 Other specified hypothyroidism: Secondary | ICD-10-CM | POA: Diagnosis not present

## 2018-03-18 DIAGNOSIS — J441 Chronic obstructive pulmonary disease with (acute) exacerbation: Secondary | ICD-10-CM | POA: Diagnosis not present

## 2018-03-18 DIAGNOSIS — E7849 Other hyperlipidemia: Secondary | ICD-10-CM | POA: Diagnosis not present

## 2018-03-22 DIAGNOSIS — E559 Vitamin D deficiency, unspecified: Secondary | ICD-10-CM | POA: Diagnosis not present

## 2018-03-22 DIAGNOSIS — E538 Deficiency of other specified B group vitamins: Secondary | ICD-10-CM | POA: Diagnosis not present

## 2018-04-16 DIAGNOSIS — Z1231 Encounter for screening mammogram for malignant neoplasm of breast: Secondary | ICD-10-CM | POA: Diagnosis not present

## 2018-06-26 DIAGNOSIS — Z6820 Body mass index (BMI) 20.0-20.9, adult: Secondary | ICD-10-CM | POA: Diagnosis not present

## 2018-06-26 DIAGNOSIS — Z0000 Encounter for general adult medical examination without abnormal findings: Secondary | ICD-10-CM | POA: Diagnosis not present

## 2018-06-26 DIAGNOSIS — E7849 Other hyperlipidemia: Secondary | ICD-10-CM | POA: Diagnosis not present

## 2018-06-26 DIAGNOSIS — F3289 Other specified depressive episodes: Secondary | ICD-10-CM | POA: Diagnosis not present

## 2018-06-26 DIAGNOSIS — E038 Other specified hypothyroidism: Secondary | ICD-10-CM | POA: Diagnosis not present

## 2018-08-01 DIAGNOSIS — Z23 Encounter for immunization: Secondary | ICD-10-CM | POA: Diagnosis not present

## 2018-08-02 ENCOUNTER — Other Ambulatory Visit: Payer: Self-pay

## 2018-08-02 ENCOUNTER — Inpatient Hospital Stay (HOSPITAL_COMMUNITY)
Admission: RE | Admit: 2018-08-02 | Discharge: 2018-08-06 | DRG: 246 | Disposition: A | Discharge status: Home / Self Care | Payer: BLUE CROSS/BLUE SHIELD | Source: Other Acute Inpatient Hospital | Attending: Cardiology | Admitting: Cardiology

## 2018-08-02 ENCOUNTER — Encounter (HOSPITAL_COMMUNITY)
Admission: RE | Disposition: A | Discharge status: Home / Self Care | Payer: Self-pay | Source: Other Acute Inpatient Hospital | Attending: Cardiology

## 2018-08-02 ENCOUNTER — Encounter (HOSPITAL_COMMUNITY): Payer: Self-pay | Admitting: Physician Assistant

## 2018-08-02 DIAGNOSIS — Z880 Allergy status to penicillin: Secondary | ICD-10-CM

## 2018-08-02 DIAGNOSIS — I309 Acute pericarditis, unspecified: Secondary | ICD-10-CM | POA: Diagnosis present

## 2018-08-02 DIAGNOSIS — Z791 Long term (current) use of non-steroidal anti-inflammatories (NSAID): Secondary | ICD-10-CM

## 2018-08-02 DIAGNOSIS — Z8249 Family history of ischemic heart disease and other diseases of the circulatory system: Secondary | ICD-10-CM | POA: Diagnosis not present

## 2018-08-02 DIAGNOSIS — R0602 Shortness of breath: Secondary | ICD-10-CM | POA: Diagnosis not present

## 2018-08-02 DIAGNOSIS — Z8679 Personal history of other diseases of the circulatory system: Secondary | ICD-10-CM | POA: Diagnosis not present

## 2018-08-02 DIAGNOSIS — Z955 Presence of coronary angioplasty implant and graft: Secondary | ICD-10-CM

## 2018-08-02 DIAGNOSIS — I340 Nonrheumatic mitral (valve) insufficiency: Secondary | ICD-10-CM | POA: Diagnosis not present

## 2018-08-02 DIAGNOSIS — I472 Ventricular tachycardia: Secondary | ICD-10-CM | POA: Diagnosis present

## 2018-08-02 DIAGNOSIS — F17210 Nicotine dependence, cigarettes, uncomplicated: Secondary | ICD-10-CM | POA: Diagnosis present

## 2018-08-02 DIAGNOSIS — I213 ST elevation (STEMI) myocardial infarction of unspecified site: Secondary | ICD-10-CM | POA: Diagnosis not present

## 2018-08-02 DIAGNOSIS — R079 Chest pain, unspecified: Secondary | ICD-10-CM | POA: Diagnosis not present

## 2018-08-02 DIAGNOSIS — Z79899 Other long term (current) drug therapy: Secondary | ICD-10-CM | POA: Diagnosis not present

## 2018-08-02 DIAGNOSIS — I5021 Acute systolic (congestive) heart failure: Secondary | ICD-10-CM | POA: Diagnosis not present

## 2018-08-02 DIAGNOSIS — E782 Mixed hyperlipidemia: Secondary | ICD-10-CM | POA: Diagnosis not present

## 2018-08-02 DIAGNOSIS — E1165 Type 2 diabetes mellitus with hyperglycemia: Secondary | ICD-10-CM | POA: Diagnosis not present

## 2018-08-02 DIAGNOSIS — Z79890 Hormone replacement therapy (postmenopausal): Secondary | ICD-10-CM

## 2018-08-02 DIAGNOSIS — J439 Emphysema, unspecified: Secondary | ICD-10-CM | POA: Diagnosis not present

## 2018-08-02 DIAGNOSIS — I2111 ST elevation (STEMI) myocardial infarction involving right coronary artery: Secondary | ICD-10-CM | POA: Diagnosis not present

## 2018-08-02 DIAGNOSIS — E039 Hypothyroidism, unspecified: Secondary | ICD-10-CM | POA: Diagnosis not present

## 2018-08-02 DIAGNOSIS — I110 Hypertensive heart disease with heart failure: Secondary | ICD-10-CM | POA: Diagnosis not present

## 2018-08-02 DIAGNOSIS — I4581 Long QT syndrome: Secondary | ICD-10-CM | POA: Diagnosis present

## 2018-08-02 DIAGNOSIS — I219 Acute myocardial infarction, unspecified: Secondary | ICD-10-CM | POA: Diagnosis present

## 2018-08-02 DIAGNOSIS — Z888 Allergy status to other drugs, medicaments and biological substances status: Secondary | ICD-10-CM | POA: Diagnosis not present

## 2018-08-02 DIAGNOSIS — J449 Chronic obstructive pulmonary disease, unspecified: Secondary | ICD-10-CM | POA: Diagnosis not present

## 2018-08-02 DIAGNOSIS — Z833 Family history of diabetes mellitus: Secondary | ICD-10-CM

## 2018-08-02 DIAGNOSIS — I2119 ST elevation (STEMI) myocardial infarction involving other coronary artery of inferior wall: Principal | ICD-10-CM | POA: Diagnosis present

## 2018-08-02 DIAGNOSIS — Z720 Tobacco use: Secondary | ICD-10-CM | POA: Diagnosis not present

## 2018-08-02 DIAGNOSIS — E7800 Pure hypercholesterolemia, unspecified: Secondary | ICD-10-CM | POA: Diagnosis not present

## 2018-08-02 DIAGNOSIS — I10 Essential (primary) hypertension: Secondary | ICD-10-CM | POA: Diagnosis not present

## 2018-08-02 DIAGNOSIS — I959 Hypotension, unspecified: Secondary | ICD-10-CM | POA: Diagnosis not present

## 2018-08-02 DIAGNOSIS — I2510 Atherosclerotic heart disease of native coronary artery without angina pectoris: Secondary | ICD-10-CM | POA: Diagnosis present

## 2018-08-02 DIAGNOSIS — R0789 Other chest pain: Secondary | ICD-10-CM | POA: Diagnosis not present

## 2018-08-02 DIAGNOSIS — F17200 Nicotine dependence, unspecified, uncomplicated: Secondary | ICD-10-CM | POA: Diagnosis not present

## 2018-08-02 HISTORY — PX: LEFT HEART CATH AND CORONARY ANGIOGRAPHY: CATH118249

## 2018-08-02 HISTORY — PX: CORONARY/GRAFT ACUTE MI REVASCULARIZATION: CATH118305

## 2018-08-02 LAB — POCT I-STAT, CHEM 8
BUN: 8 mg/dL (ref 6–20)
CALCIUM ION: 1.29 mmol/L (ref 1.15–1.40)
CHLORIDE: 102 mmol/L (ref 98–111)
Creatinine, Ser: 0.5 mg/dL (ref 0.44–1.00)
Glucose, Bld: 148 mg/dL — ABNORMAL HIGH (ref 70–99)
HCT: 39 % (ref 36.0–46.0)
Hemoglobin: 13.3 g/dL (ref 12.0–15.0)
Potassium: 3.2 mmol/L — ABNORMAL LOW (ref 3.5–5.1)
Sodium: 137 mmol/L (ref 135–145)
TCO2: 23 mmol/L (ref 22–32)

## 2018-08-02 LAB — CBC
HEMATOCRIT: 34.8 % — AB (ref 36.0–46.0)
HEMOGLOBIN: 11.1 g/dL — AB (ref 12.0–15.0)
MCH: 28.4 pg (ref 26.0–34.0)
MCHC: 31.9 g/dL (ref 30.0–36.0)
MCV: 89 fL (ref 78.0–100.0)
Platelets: 227 10*3/uL (ref 150–400)
RBC: 3.91 MIL/uL (ref 3.87–5.11)
RDW: 14.1 % (ref 11.5–15.5)
WBC: 8.8 10*3/uL (ref 4.0–10.5)

## 2018-08-02 LAB — COMPREHENSIVE METABOLIC PANEL
ALT: 16 U/L (ref 0–44)
AST: 49 U/L — ABNORMAL HIGH (ref 15–41)
Albumin: 2.5 g/dL — ABNORMAL LOW (ref 3.5–5.0)
Alkaline Phosphatase: 60 U/L (ref 38–126)
Anion gap: 11 (ref 5–15)
BUN: 8 mg/dL (ref 6–20)
CHLORIDE: 104 mmol/L (ref 98–111)
CO2: 20 mmol/L — ABNORMAL LOW (ref 22–32)
Calcium: 8.1 mg/dL — ABNORMAL LOW (ref 8.9–10.3)
Creatinine, Ser: 0.57 mg/dL (ref 0.44–1.00)
Glucose, Bld: 123 mg/dL — ABNORMAL HIGH (ref 70–99)
POTASSIUM: 3 mmol/L — AB (ref 3.5–5.1)
Sodium: 135 mmol/L (ref 135–145)
Total Bilirubin: 0.6 mg/dL (ref 0.3–1.2)
Total Protein: 4.9 g/dL — ABNORMAL LOW (ref 6.5–8.1)

## 2018-08-02 LAB — LIPID PANEL
CHOLESTEROL: 133 mg/dL (ref 0–200)
HDL: 28 mg/dL — ABNORMAL LOW (ref >40)
LDL Cholesterol: 92 mg/dL (ref 0–99)
TRIGLYCERIDES: 64 mg/dL (ref <150)
Total CHOL/HDL Ratio: 4.8 RATIO
VLDL: 13 mg/dL (ref 0–40)

## 2018-08-02 LAB — TROPONIN I
TROPONIN I: 6.05 ng/mL — AB (ref <0.03)
TROPONIN I: 15.41 ng/mL — AB (ref <0.03)

## 2018-08-02 LAB — APTT

## 2018-08-02 LAB — HEMOGLOBIN A1C
Hgb A1c MFr Bld: 5.6 % (ref 4.8–5.6)
Hgb A1c MFr Bld: 5.5 % (ref 4.8–5.6)
Mean Plasma Glucose: 114.02 mg/dL
Mean Plasma Glucose: 111.15 mg/dL

## 2018-08-02 LAB — PROTIME-INR
INR: 1.28
Prothrombin Time: 15.9 seconds — ABNORMAL HIGH (ref 11.4–15.2)

## 2018-08-02 LAB — POCT ACTIVATED CLOTTING TIME
ACTIVATED CLOTTING TIME: 417 s
Activated Clotting Time: 153 seconds

## 2018-08-02 LAB — MRSA PCR SCREENING: MRSA BY PCR: POSITIVE — AB

## 2018-08-02 SURGERY — LEFT HEART CATH AND CORONARY ANGIOGRAPHY
Anesthesia: LOCAL

## 2018-08-02 MED ORDER — LIDOCAINE HCL (PF) 1 % IJ SOLN
INTRAMUSCULAR | Status: AC
  Filled 2018-08-02: qty 30

## 2018-08-02 MED ORDER — TICAGRELOR 90 MG PO TABS
ORAL_TABLET | ORAL | Status: AC
  Filled 2018-08-02: qty 2

## 2018-08-02 MED ORDER — ONDANSETRON HCL 4 MG/2ML IJ SOLN: 4.0000 mg | Freq: Four times a day (QID) | INTRAMUSCULAR | Status: DC | PRN

## 2018-08-02 MED ORDER — NITROGLYCERIN 0.4 MG SL SUBL
0.4000 mg | SUBLINGUAL_TABLET | SUBLINGUAL | Status: DC | PRN
  Administered 2018-08-02 – 2018-08-03 (×4): 0.4 mg via SUBLINGUAL
  Filled 2018-08-02 (×3): qty 1

## 2018-08-02 MED ORDER — FENTANYL CITRATE (PF) 100 MCG/2ML IJ SOLN
INTRAMUSCULAR | Status: AC
  Filled 2018-08-02: qty 2

## 2018-08-02 MED ORDER — TIROFIBAN HCL IV 12.5 MG/250 ML
INTRAVENOUS | Status: AC | PRN
  Administered 2018-08-02: 0.15 ug/kg/min via INTRAVENOUS

## 2018-08-02 MED ORDER — SODIUM CHLORIDE 0.9% FLUSH
3.0000 mL | Freq: Two times a day (BID) | INTRAVENOUS | Status: DC
  Administered 2018-08-03 – 2018-08-06 (×7): 3 mL via INTRAVENOUS

## 2018-08-02 MED ORDER — VERAPAMIL HCL 2.5 MG/ML IV SOLN
INTRAVENOUS | Status: AC
  Filled 2018-08-02: qty 2

## 2018-08-02 MED ORDER — SODIUM CHLORIDE 0.9 % IV SOLN: 250.0000 mL | INTRAVENOUS | Status: DC | PRN

## 2018-08-02 MED ORDER — NITROGLYCERIN 1 MG/10 ML FOR IR/CATH LAB
INTRA_ARTERIAL | Status: DC | PRN
  Administered 2018-08-02: 200 ug via INTRACORONARY

## 2018-08-02 MED ORDER — ASPIRIN 300 MG RE SUPP: 300.0000 mg | RECTAL | Status: AC

## 2018-08-02 MED ORDER — TIROFIBAN HCL IV 12.5 MG/250 ML
INTRAVENOUS | Status: AC
  Filled 2018-08-02: qty 250

## 2018-08-02 MED ORDER — CHLORHEXIDINE GLUCONATE CLOTH 2 % EX PADS
6.0000 | MEDICATED_PAD | Freq: Every day | CUTANEOUS | Status: DC
  Administered 2018-08-06: 6 via TOPICAL

## 2018-08-02 MED ORDER — ACETAMINOPHEN 325 MG PO TABS
650.0000 mg | ORAL_TABLET | ORAL | Status: DC | PRN
  Filled 2018-08-02: qty 2

## 2018-08-02 MED ORDER — FENTANYL CITRATE (PF) 100 MCG/2ML IJ SOLN
INTRAMUSCULAR | Status: DC | PRN
  Administered 2018-08-02: 25 ug via INTRAVENOUS

## 2018-08-02 MED ORDER — HEPARIN (PORCINE) IN NACL 1000-0.9 UT/500ML-% IV SOLN
INTRAVENOUS | Status: DC | PRN
  Administered 2018-08-02 (×2): 500 mL

## 2018-08-02 MED ORDER — ASPIRIN 81 MG PO CHEW: 324.0000 mg | CHEWABLE_TABLET | ORAL | Status: AC

## 2018-08-02 MED ORDER — HEPARIN (PORCINE) IN NACL 1000-0.9 UT/500ML-% IV SOLN
INTRAVENOUS | Status: AC
  Filled 2018-08-02: qty 1000

## 2018-08-02 MED ORDER — ROSUVASTATIN CALCIUM 20 MG PO TABS
40.0000 mg | ORAL_TABLET | Freq: Every day | ORAL | Status: DC
  Administered 2018-08-02 – 2018-08-06 (×5): 40 mg via ORAL
  Filled 2018-08-02 (×2): qty 2
  Filled 2018-08-02 (×3): qty 1

## 2018-08-02 MED ORDER — NITROGLYCERIN 1 MG/10 ML FOR IR/CATH LAB
INTRA_ARTERIAL | Status: AC
  Filled 2018-08-02: qty 10

## 2018-08-02 MED ORDER — ONDANSETRON HCL 4 MG/2ML IJ SOLN
4.0000 mg | Freq: Four times a day (QID) | INTRAMUSCULAR | Status: DC | PRN
  Filled 2018-08-02: qty 2

## 2018-08-02 MED ORDER — SODIUM CHLORIDE 0.9 % IV SOLN
INTRAVENOUS | Status: AC | PRN
  Administered 2018-08-02: 500 mL via INTRAVENOUS

## 2018-08-02 MED ORDER — MIDAZOLAM HCL 2 MG/2ML IJ SOLN
INTRAMUSCULAR | Status: AC
  Filled 2018-08-02: qty 2

## 2018-08-02 MED ORDER — METOPROLOL TARTRATE 12.5 MG HALF TABLET
12.5000 mg | ORAL_TABLET | Freq: Two times a day (BID) | ORAL | Status: DC
  Administered 2018-08-02: 12.5 mg via ORAL
  Filled 2018-08-02: qty 1

## 2018-08-02 MED ORDER — VERAPAMIL HCL 2.5 MG/ML IV SOLN
INTRAVENOUS | Status: DC | PRN
  Administered 2018-08-02: 10 mL via INTRA_ARTERIAL

## 2018-08-02 MED ORDER — TICAGRELOR 90 MG PO TABS
ORAL_TABLET | ORAL | Status: DC | PRN
  Administered 2018-08-02: 180 mg via ORAL

## 2018-08-02 MED ORDER — TICAGRELOR 90 MG PO TABS
90.0000 mg | ORAL_TABLET | Freq: Two times a day (BID) | ORAL | Status: DC
  Administered 2018-08-02 – 2018-08-06 (×8): 90 mg via ORAL
  Filled 2018-08-02 (×8): qty 1

## 2018-08-02 MED ORDER — MIDAZOLAM HCL 2 MG/2ML IJ SOLN
INTRAMUSCULAR | Status: DC | PRN
  Administered 2018-08-02: 1 mg via INTRAVENOUS

## 2018-08-02 MED ORDER — POTASSIUM CHLORIDE CRYS ER 20 MEQ PO TBCR
40.0000 meq | EXTENDED_RELEASE_TABLET | Freq: Once | ORAL | Status: AC
  Administered 2018-08-02: 40 meq via ORAL
  Filled 2018-08-02: qty 2

## 2018-08-02 MED ORDER — TIROFIBAN (AGGRASTAT) BOLUS VIA INFUSION
INTRAVENOUS | Status: DC | PRN
  Administered 2018-08-02: 1507.5 ug via INTRAVENOUS

## 2018-08-02 MED ORDER — HEPARIN SODIUM (PORCINE) 1000 UNIT/ML IJ SOLN
INTRAMUSCULAR | Status: DC | PRN
  Administered 2018-08-02: 6000 [IU] via INTRAVENOUS

## 2018-08-02 MED ORDER — ASPIRIN EC 81 MG PO TBEC: 81.0000 mg | DELAYED_RELEASE_TABLET | Freq: Every day | ORAL | Status: DC

## 2018-08-02 MED ORDER — HYPROMELLOSE (GONIOSCOPIC) 2.5 % OP SOLN
1.0000 [drp] | OPHTHALMIC | Status: DC | PRN
  Filled 2018-08-02: qty 15

## 2018-08-02 MED ORDER — LIDOCAINE HCL (PF) 1 % IJ SOLN
INTRAMUSCULAR | Status: DC | PRN
  Administered 2018-08-02: 2 mL

## 2018-08-02 MED ORDER — IOHEXOL 350 MG/ML SOLN
INTRAVENOUS | Status: DC | PRN
  Administered 2018-08-02: 130 mL via INTRA_ARTERIAL

## 2018-08-02 MED ORDER — HYDRALAZINE HCL 20 MG/ML IJ SOLN: 5.0000 mg | INTRAMUSCULAR | Status: AC | PRN

## 2018-08-02 MED ORDER — MUPIROCIN 2 % EX OINT
1.0000 "application " | TOPICAL_OINTMENT | Freq: Two times a day (BID) | CUTANEOUS | Status: DC
  Administered 2018-08-02 – 2018-08-06 (×8): 1 via NASAL
  Filled 2018-08-02 (×2): qty 22

## 2018-08-02 MED ORDER — TIROFIBAN HCL IN NACL 5-0.9 MG/100ML-% IV SOLN
0.1500 ug/kg/min | INTRAVENOUS | Status: AC
  Filled 2018-08-02: qty 100

## 2018-08-02 MED ORDER — NICOTINE 21 MG/24HR TD PT24
21.0000 mg | MEDICATED_PATCH | Freq: Every day | TRANSDERMAL | Status: DC
  Administered 2018-08-02 – 2018-08-06 (×5): 21 mg via TRANSDERMAL
  Filled 2018-08-02 (×5): qty 1

## 2018-08-02 MED ORDER — LABETALOL HCL 5 MG/ML IV SOLN: 10.0000 mg | INTRAVENOUS | Status: AC | PRN

## 2018-08-02 MED ORDER — SODIUM CHLORIDE 0.9 % IV SOLN: INTRAVENOUS | Status: AC

## 2018-08-02 MED ORDER — SODIUM CHLORIDE 0.9% FLUSH: 3.0000 mL | INTRAVENOUS | Status: DC | PRN

## 2018-08-02 MED ORDER — ACETAMINOPHEN 325 MG PO TABS
650.0000 mg | ORAL_TABLET | ORAL | Status: DC | PRN
  Administered 2018-08-05: 650 mg via ORAL

## 2018-08-02 MED ORDER — LEVOTHYROXINE SODIUM 112 MCG PO TABS
112.0000 ug | ORAL_TABLET | Freq: Every day | ORAL | Status: DC
  Administered 2018-08-03 – 2018-08-05 (×3): 112 ug via ORAL
  Filled 2018-08-02 (×3): qty 1

## 2018-08-02 MED ORDER — ASPIRIN 81 MG PO CHEW
81.0000 mg | CHEWABLE_TABLET | Freq: Every day | ORAL | Status: DC
  Administered 2018-08-03 – 2018-08-06 (×4): 81 mg via ORAL
  Filled 2018-08-02 (×4): qty 1

## 2018-08-02 SURGICAL SUPPLY — 19 items
BALLN SAPPHIRE 2.5X12 (BALLOONS) ×2
BALLN ~~LOC~~ EMERGE MR 3.5X20 (BALLOONS) ×2
BALLOON SAPPHIRE 2.5X12 (BALLOONS) ×1 IMPLANT
BALLOON ~~LOC~~ EMERGE MR 3.5X20 (BALLOONS) ×1 IMPLANT
CATH INFINITI 5FR ANG PIGTAIL (CATHETERS) ×2 IMPLANT
CATH OPTITORQUE TIG 4.0 5F (CATHETERS) ×2 IMPLANT
CATH VISTA GUIDE 6FR JR4 (CATHETERS) ×2 IMPLANT
DEVICE RAD COMP TR BAND LRG (VASCULAR PRODUCTS) ×2 IMPLANT
GLIDESHEATH SLEND A-KIT 6F 22G (SHEATH) ×2 IMPLANT
GUIDEWIRE INQWIRE 1.5J.035X260 (WIRE) ×1 IMPLANT
INQWIRE 1.5J .035X260CM (WIRE) ×2
KIT ENCORE 26 ADVANTAGE (KITS) ×2 IMPLANT
KIT HEART LEFT (KITS) ×2 IMPLANT
PACK CARDIAC CATHETERIZATION (CUSTOM PROCEDURE TRAY) ×2 IMPLANT
STENT SYNERGY DES 3X32 (Permanent Stent) ×2 IMPLANT
SYR MEDRAD MARK V 150ML (SYRINGE) ×2 IMPLANT
TRANSDUCER W/STOPCOCK (MISCELLANEOUS) ×2 IMPLANT
TUBING CIL FLEX 10 FLL-RA (TUBING) ×2 IMPLANT
WIRE ASAHI PROWATER 180CM (WIRE) ×2 IMPLANT

## 2018-08-02 NOTE — Progress Notes (Signed)
CRITICAL VALUE ALERT  Critical Value:  PTT >200 and Troponin of 6.05  Date & Time Notied:  08/02/18 @ 1800  Provider Notified: Dr. Anne Fu  Orders Received/Actions taken: Results to be expected.

## 2018-08-02 NOTE — H&P (Signed)
Cardiology Admission History and Physical:   Patient ID: EATHEL PAJAK MRN: [id number]; DOB: [birth year]   Admission date: 08/02/2018  Primary Care Provider: Toma Deiters, MD Primary Cardiologist: Prentice Docker, MD Primary Electrophysiologist:  None   Chief Complaint:  Chest pain  Patient Profile:   Whitney May is a 57 y.o. female with HLD, hypothyroidism, EtOH abuse, Tobacco abuse, and h/o pericardial effusion presented as inferior STEMI  History of Present Illness:   Whitney May is a 57 year old Caucasian female with past medical history of hyperlipidemia, hypothyroidism, EtOH abuse, tobacco abuse, and history of pericardial effusion.  Patient was previously admitted in 2016 with chest pain.  Echocardiogram at the time showed normal EF, mild to moderate pericardial effusion.  A stress test obtained in 2016 was normal.  She has not been seen by cardiology service since.  For the past 3 days, she has been having intermittent left-sided chest discomfort radiating to the left shoulder and left jaw.  The symptom recurred this morning and has not went away since.  She contacted the EMS will transfer her to [facility].  You reviewed the EKG showed significant ST elevation in the inferior lead concerning for inferior STEMI.  She was given 4 doses of morphine, 4000 units of IV heparin, 325 mg aspirin, and nitroglycerin.  On arrival to [facility] around 3:30 PM, she continued to have 7-8 out of 10 chest pain.  She was taken emergently to the Cath Lab for cardiac catheterization.  Her vital signs are stable on arrival.   Past Medical History:  Diagnosis Date  . Alcohol use   . Chest pain, unspecified   . Hyperlipidemia, mixed   . Hypothyroidism   . Tobacco abuse     Past Surgical History:  Procedure Laterality Date  . CESAREAN SECTION       Medications Prior to Admission: Prior to Admission medications   Medication Sig Start Date End Date Taking? Authorizing  Provider  clobetasol cream (TEMOVATE) 0.05 % Apply 1 application topically daily. 07/09/15   [provider]  folic acid (FOLVITE) 1 MG tablet Take 1 tablet (1 mg total) by mouth daily. 09/25/15   Hongalgi, Maximino Greenland, MD  hydroxypropyl methylcellulose / hypromellose (ISOPTO TEARS / GONIOVISC) 2.5 % ophthalmic solution Place 1 drop into both eyes as needed for dry eyes.    [provider]  levothyroxine (SYNTHROID, LEVOTHROID) 112 MCG tablet Take 1 tablet by mouth daily. 09/14/15   [provider]  Multiple Vitamin (MULTIVITAMIN WITH MINERALS) TABS tablet Take 1 tablet by mouth daily. 09/25/15   Hongalgi, Maximino Greenland, MD  naproxen sodium (ANAPROX) 220 MG tablet Take 1 tablet (220 mg total) by mouth 2 (two) times daily as needed (pain). 09/25/15   Hongalgi, Maximino Greenland, MD  nicotine (NICODERM CQ - DOSED IN MG/24 HOURS) 21 mg/24hr patch Place 1 patch (21 mg total) onto the skin daily. 09/25/15   Hongalgi, Maximino Greenland, MD  rosuvastatin (CRESTOR) 10 MG tablet Take 1 tablet by mouth daily. 09/14/15   [provider]  thiamine 100 MG tablet Take 1 tablet (100 mg total) by mouth daily. 09/25/15   Elease Etienne, MD     Allergies:    Allergies  Allergen Reactions  . Penicillins Other (See Comments)    Fever, vomiting    Social History:   Social History   Socioeconomic History  . Marital status: Married    Spouse name: Not on file  . Number of children:  Not on file  . Years of education: Not on file  . Highest education level: Not on file  Occupational History  . Not on file  Social Needs  . Financial resource strain: Not on file  . Food insecurity:    Worry: Not on file    Inability: Not on file  . Transportation needs:    Medical: Not on file    Non-medical: Not on file  Tobacco Use  . Smoking status: Current Every Day Smoker    Packs/day: 1.00    Years: 22.00    Pack years: 22.00  Substance and Sexual Activity  . Alcohol use: Yes    Alcohol/week: 8.0  standard drinks    Types: 8 Glasses of wine per week  . Drug use: No  . Sexual activity: Not on file  Lifestyle  . Physical activity:    Days per week: Not on file    Minutes per session: Not on file  . Stress: Not on file  Relationships  . Social connections:    Talks on phone: Not on file    Gets together: Not on file    Attends religious service: Not on file    Active member of club or organization: Not on file    Attends meetings of clubs or organizations: Not on file    Relationship status: Not on file  . Intimate partner violence:    Fear of current or ex partner: Not on file    Emotionally abused: Not on file    Physically abused: Not on file    Forced sexual activity: Not on file  Other Topics Concern  . Not on file  Social History Narrative   Married   Gets regular exercise    Family History:   The patient's family history includes Coronary artery disease in her maternal grandmother and mother; Diabetes in her father; Hypertension in her father.    ROS:  Please see the history of present illness.  All other ROS reviewed and negative.     Physical Exam/Data:   Vitals:   08/02/18 1537  SpO2: 99%  Weight: 60.3 kg  Height: 5\' 2"  (1.575 m)   No intake or output data in the 24 hours ending 08/02/18 1553 Filed Weights   08/02/18 1537  Weight: 60.3 kg   Body mass index is 24.31 kg/m.  General:  Well nourished, well developed, in no acute distress HEENT: normal Lymph: no adenopathy Neck: no JVD Endocrine:  No thryomegaly Vascular: No carotid bruits; FA pulses 2+ bilaterally without bruits  Cardiac:  normal S1, S2; RRR; no murmur  Lungs:  clear to auscultation bilaterally, no wheezing, rhonchi or rales  Abd: soft, nontender, no hepatomegaly  Ext: no edema Musculoskeletal:  No deformities, BUE and BLE strength normal and equal Skin: warm and dry  Neuro:  CNs 2-12 intact, no focal abnormalities noted Psych:  Normal affect    EKG:  The ECG that was done  by EMS was personally reviewed and demonstrates normal sinus rhythm 6 mm ST elevation in inferior leads.  Relevant CV Studies:  Echocardiogram and Myoview in 2016  Laboratory Data:  ChemistryNo results for input(s): NA, K, CL, CO2, GLUCOSE, BUN, CREATININE, CALCIUM, GFRNONAA, GFRAA, ANIONGAP in the last 168 hours.  No results for input(s): PROT, ALBUMIN, AST, ALT, ALKPHOS, BILITOT in the last 168 hours. HematologyNo results for input(s): WBC, RBC, HGB, HCT, MCV, MCH, MCHC, RDW, PLT in the last 168 hours. Cardiac EnzymesNo results for input(s): TROPONINI in  the last 168 hours. No results for input(s): TROPIPOC in the last 168 hours.  BNPNo results for input(s): BNP, PROBNP in the last 168 hours.  DDimer No results for input(s): DDIMER in the last 168 hours.  Radiology/Studies:  No results found.  Assessment and Plan:   1. Inferior STEMI: Urgent cardiac catheterization.  -Continue to have significant chest pain on arrival to the [facility].  Has 6 to 7 mm ST elevation in inferior leads with corresponding ST depressions in V1 through V2.  -Proceed with urgent cardiac catheterization.  -Serial troponin.  Admit to ICU.  Obtain echocardiogram.  2. Tobacco abuse: Needed tobacco cessation.  3. Hypothyroidism  4. Pericardial effusion: Seen on previous echocardiogram in 2016.  Repeat echocardiogram  Severity of Illness: The appropriate patient status for this patient is INPATIENT. Inpatient status is judged to be reasonable and necessary in order to provide the required intensity of service to ensure the patient's safety. The patient's presenting symptoms, physical exam findings, and initial radiographic and laboratory data in the context of their chronic comorbidities is felt to place them at high risk for further clinical deterioration. Furthermore, it is not anticipated that the patient will be medically stable for discharge from the hospital within 2 midnights of admission. The  following factors support the patient status of inpatient.   " The patient's presenting symptoms include chest pain. " The worrisome physical exam findings include benign. " The initial radiographic and laboratory data are worrisome because of ST elevation in inferior leads on EKG. " The chronic co-morbidities include HTN.   * I certify that at the point of admission it is my clinical judgment that the patient will require inpatient hospital care spanning beyond 2 midnights from the point of admission due to high intensity of service, high risk for further deterioration and high frequency of surveillance required.*    For questions or updates, please contact [facility] Please consult [email or web address] for contact info under        Ramond Dial, [place]  08/02/2018 3:53 PM

## 2018-08-03 LAB — BASIC METABOLIC PANEL
ANION GAP: 9 (ref 5–15)
BUN: 5 mg/dL — ABNORMAL LOW (ref 6–20)
CHLORIDE: 107 mmol/L (ref 98–111)
CO2: 20 mmol/L — ABNORMAL LOW (ref 22–32)
Calcium: 8.3 mg/dL — ABNORMAL LOW (ref 8.9–10.3)
Creatinine, Ser: 0.63 mg/dL (ref 0.44–1.00)
GFR calc non Af Amer: >60 mL/min (ref >60)
Glucose, Bld: 116 mg/dL — ABNORMAL HIGH (ref 70–99)
POTASSIUM: 3.8 mmol/L (ref 3.5–5.1)
SODIUM: 136 mmol/L (ref 135–145)

## 2018-08-03 LAB — CBC
HCT: 36.7 % (ref 36.0–46.0)
Hemoglobin: 11.7 g/dL — ABNORMAL LOW (ref 12.0–15.0)
MCH: 28.4 pg (ref 26.0–34.0)
MCHC: 31.9 g/dL (ref 30.0–36.0)
MCV: 89.1 fL (ref 78.0–100.0)
PLATELETS: 272 10*3/uL (ref 150–400)
RBC: 4.12 MIL/uL (ref 3.87–5.11)
RDW: 14.6 % (ref 11.5–15.5)
WBC: 13.5 10*3/uL — ABNORMAL HIGH (ref 4.0–10.5)

## 2018-08-03 LAB — TROPONIN I
Troponin I: 47.65 ng/mL (ref <0.03)
Troponin I: 51.07 ng/mL (ref <0.03)

## 2018-08-03 LAB — HIV ANTIBODY (ROUTINE TESTING W REFLEX): HIV Screen 4th Generation wRfx: NONREACTIVE

## 2018-08-03 LAB — LIPID PANEL
CHOL/HDL RATIO: 5.4 ratio
CHOLESTEROL: 145 mg/dL (ref 0–200)
HDL: 27 mg/dL — AB (ref >40)
LDL Cholesterol: 81 mg/dL (ref 0–99)
TRIGLYCERIDES: 185 mg/dL — AB (ref <150)
VLDL: 37 mg/dL (ref 0–40)

## 2018-08-03 MED ORDER — LOSARTAN POTASSIUM 25 MG PO TABS
25.0000 mg | ORAL_TABLET | Freq: Every day | ORAL | Status: DC
  Administered 2018-08-03: 25 mg via ORAL
  Filled 2018-08-03: qty 1

## 2018-08-03 MED ORDER — METOPROLOL TARTRATE 12.5 MG HALF TABLET
12.5000 mg | ORAL_TABLET | Freq: Once | ORAL | Status: AC
  Administered 2018-08-03: 12.5 mg via ORAL
  Filled 2018-08-03: qty 1

## 2018-08-03 MED ORDER — METOPROLOL TARTRATE 25 MG PO TABS: 25.0000 mg | ORAL_TABLET | Freq: Two times a day (BID) | ORAL | Status: DC

## 2018-08-03 MED ORDER — METOPROLOL TARTRATE 25 MG PO TABS
25.0000 mg | ORAL_TABLET | Freq: Two times a day (BID) | ORAL | Status: DC
  Administered 2018-08-03: 25 mg via ORAL
  Filled 2018-08-03: qty 1

## 2018-08-03 NOTE — Progress Notes (Signed)
Progress Note  Patient Name: Whitney May Date of Encounter: 08/03/2018  Primary Cardiologist: Prentice Docker, MD   Subjective   No angina, but has pleuritic pain with cough and deep breathing. No dyspnea. Has a lot of back discomfort.  Inpatient Medications    Scheduled Meds: . aspirin  81 mg Oral Daily  . Chlorhexidine Gluconate Cloth  6 each Topical Q0600  . levothyroxine  112 mcg Oral Daily  . metoprolol tartrate  12.5 mg Oral BID  . mupirocin ointment  1 application Nasal BID  . nicotine  21 mg Transdermal Daily  . rosuvastatin  40 mg Oral Daily  . sodium chloride flush  3 mL Intravenous Q12H  . ticagrelor  90 mg Oral BID   Continuous Infusions: . sodium chloride     PRN Meds: sodium chloride, acetaminophen, acetaminophen, hydroxypropyl methylcellulose / hypromellose, nitroGLYCERIN, ondansetron (ZOFRAN) IV, ondansetron (ZOFRAN) IV, sodium chloride flush   Vital Signs    Vitals:   08/03/18 0630 08/03/18 0700 08/03/18 0738 08/03/18 0800  BP:  124/86  (!) 131/95  Pulse:      Resp:  13  (!) 22  Temp:   98.2 F (36.8 C)   TempSrc:   Oral   SpO2: 99% 99%  100%  Weight:      Height:        Intake/Output Summary (Last 24 hours) at 08/03/2018 0826 Last data filed at 08/03/2018 0800 Gross per 24 hour  Intake 916.39 ml  Output 701 ml  Net 215.39 ml   Filed Weights   08/02/18 1537 08/03/18 0100  Weight: 60.3 kg 54.8 kg    Telemetry    Mild sinus tachycardia; one 20 beat episode of WCT with clear P waves c/w rate related BBB; one 8-beat AIVR with retrograde P, several 3-10 beat runs of NSVT - Personally Reviewed  ECG    STachy, prominent inferior ST elevation persists ("frozen infarct pattern"?) with ST elevation also in V5-V6 and reciprocal anteroseptal ST depression - Personally Reviewed  Physical Exam  Appears a little anxious GEN: No acute distress.   Neck: No JVD Cardiac: RRR, no murmurs, rubs, or gallops.  Respiratory: Clear to auscultation  bilaterally. GI: Soft, nontender, non-distended  MS: No edema; No deformity. No problems at R radial cath access site Neuro:  Nonfocal  Psych: Normal affect   Labs    Chemistry Recent Labs  Lab 08/02/18 1542 08/02/18 1614 08/03/18 0611  NA 137 135 136  K 3.2* 3.0* 3.8  CL 102 104 107  CO2  --  20* 20*  GLUCOSE 148* 123* 116*  BUN 8 8 5*  CREATININE 0.50 0.57 0.63  CALCIUM  --  8.1* 8.3*  PROT  --  4.9*  --   ALBUMIN  --  2.5*  --   AST  --  49*  --   ALT  --  16  --   ALKPHOS  --  60  --   BILITOT  --  0.6  --   GFRNONAA  --  >60 >60  GFRAA  --  >60 >60  ANIONGAP  --  11 9     Hematology Recent Labs  Lab 08/02/18 1542 08/02/18 1614 08/03/18 0611  WBC  --  8.8 13.5*  RBC  --  3.91 4.12  HGB 13.3 11.1* 11.7*  HCT 39.0 34.8* 36.7  MCV  --  89.0 89.1  MCH  --  28.4 28.4  MCHC  --  31.9 31.9  RDW  --  14.1 14.6  PLT  --  227 272    Cardiac Enzymes Recent Labs  Lab 08/02/18 1614 08/02/18 1733 08/02/18 2354 08/03/18 0611  TROPONINI 6.05* 15.41* 47.65* 51.07*   No results for input(s): TROPIPOC in the last 168 hours.   BNPNo results for input(s): BNP, PROBNP in the last 168 hours.   DDimer No results for input(s): DDIMER in the last 168 hours.   Radiology    No results found.  Cardiac Studies   Cath 08/02/2018 Conclusion     There is mild left ventricular systolic dysfunction.  The left ventricular ejection fraction is 35-45% by visual estimate.  There is no aortic valve stenosis.  Mid RCA lesion is 20% stenosed.  Mid RCA to Dist RCA lesion is 100% stenosed.  A drug-eluting stent was successfully placed using a STENT SYNERGY DES 3X32. Post Dilated to 3.6 mm  Post intervention, there is a 0% residual stenosis.  Post Atrio lesion is 95% stenosed - distal embolization (too small & distal) -- > IV Aggrastat  Mid LM to Dist LM lesion is 35% stenosed.  Prox LAD-1 lesion is 20% stenosed. Prox LAD-2 lesion is 40% stenosed. Prox LAD-3 lesion  is 20% stenosed.   CULPRIT LESION: 100% mid RCA thrombotic occlusion -> treated with Synergy DES stent (3.0 mm x 32 mm postdilated to 3.6 mm) Residual disease in the PDA of roughly 20% improved with nitroglycerin, also distal embolization of thrombus into the distal main RPL branch with some resolution after IC nitroglycerin and Aggrastat. 30 to 40% left main disease with mild 20 to 40% mid LAD disease at takeoff of large diagonal branch. Mildly reduced LVEF of 45 to 50% with inferior hypokinesis.  Plan: Transfer to CCU for post cardiac cath care.  Will run Aggrastat for 6 hours due to but distal embolization and large thrombus burden. Increase Crestor to 40 mg daily. When blood pressure tolerates, start low-dose beta-blocker and possible ACE inhibitor/ARB due to LV dysfunction. Check 2D echocardiogram prior to discharge.  Recommend uninterrupted dual antiplatelet therapy with Aspirin 81mg  daily and Ticagrelor 90mg  twice daily for a minimum of 12 months (ACS - Class I recommendation).     Patient Profile     58 y.o. female s/p large inferior wall STEMI treated with DES 08/02/2018, persistent 95% ostial PLV stenosis and distal embolism, complicated by NSVT, moderate LV dysfunction  Assessment & Plan    1. CAD s/p inferior STEMI:  Extensive injury by ECG, troponin peaking only at approx 50. Could not have full revascularization and likely has ongoing inferolateral ischemia. Discussed mandatory DAPT x 12 months, high dose statin. She has already decided to stop smoking. 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 2. CHF, systolic:  EF is decreased and her resting tachycardia probably represents CHF, although no dyspnea at rest and no rales. Start ARB and low dose beta blocker (prefer selective agant over carvedilol due to diagnosis of emphysema). ECG worrisome for risk of aneurysm/pseudoaneurysm formation in inferior wall. Echo pending. 3.  Acute pericarditis: pleuritic symptoms and ECG c/w post MI pericarditis. Note history of non-ischemic acute pericarditis in 2016. 4. COPD: bears diagnosis of emphysema. no active wheezing. Has committed to smoking cessation.  Lives in [place], [place]. Prefers rehab at [facility]. F/U will be with Dr. Purvis Sheffield.  For questions or updates, please contact [facility] Please consult [email or web address] for contact info under        Signed, [employer],  MD  08/03/2018, 8:26 AM

## 2018-08-03 NOTE — Progress Notes (Signed)
CARDIAC REHAB PHASE I   MI education began with pt. Pt educated on importance of ASA, Brilinta, statin and NTG. MI book given to pt. Discussed risk factors with pt, pt aware of need for smoking cessation and is agreeable. Reviewed restrictions with pt. Holding ambulation until after echo. Will refer to CRP II . Will continue to follow.  [id number] Reynold Bowen, RN BSN 08/03/2018 11:07 AM

## 2018-08-03 NOTE — Progress Notes (Signed)
.  EKG CRITICAL VALUE     12 lead EKG performed.  Critical value noted. Eloise Harman RN notified.   Abbe Amsterdam, CCT 08/03/2018 9:00 AM

## 2018-08-04 ENCOUNTER — Inpatient Hospital Stay (HOSPITAL_COMMUNITY): Payer: BLUE CROSS/BLUE SHIELD

## 2018-08-04 DIAGNOSIS — I340 Nonrheumatic mitral (valve) insufficiency: Secondary | ICD-10-CM

## 2018-08-04 LAB — ECHOCARDIOGRAM COMPLETE
Height: 62 in
Weight: 1932.99 oz

## 2018-08-04 LAB — VITAMIN B12: Vitamin B-12: 824 pg/mL (ref 180–914)

## 2018-08-04 MED ORDER — METOPROLOL TARTRATE 12.5 MG HALF TABLET
12.5000 mg | ORAL_TABLET | Freq: Two times a day (BID) | ORAL | Status: DC
  Administered 2018-08-04: 12.5 mg via ORAL
  Filled 2018-08-04 (×2): qty 1

## 2018-08-04 NOTE — Progress Notes (Signed)
Progress Note  Patient Name: Whitney May Date of Encounter: 08/04/2018  Primary Cardiologist: Prentice Docker, MD   Subjective   She feels better, but did require SL NTG once overnight. BP is low.  Inpatient Medications    Scheduled Meds: . aspirin  81 mg Oral Daily  . Chlorhexidine Gluconate Cloth  6 each Topical Q0600  . levothyroxine  112 mcg Oral Daily  . losartan  25 mg Oral Daily  . metoprolol tartrate  12.5 mg Oral BID  . mupirocin ointment  1 application Nasal BID  . nicotine  21 mg Transdermal Daily  . rosuvastatin  40 mg Oral Daily  . sodium chloride flush  3 mL Intravenous Q12H  . ticagrelor  90 mg Oral BID   Continuous Infusions: . sodium chloride     PRN Meds: sodium chloride, acetaminophen, acetaminophen, hydroxypropyl methylcellulose / hypromellose, nitroGLYCERIN, ondansetron (ZOFRAN) IV, ondansetron (ZOFRAN) IV, sodium chloride flush   Vital Signs    Vitals:   08/04/18 0500 08/04/18 0600 08/04/18 0700 08/04/18 0751  BP: 109/66 95/65 (!) 85/62   Pulse:      Resp: 18 16 14    Temp:    98.7 F (37.1 C)  TempSrc:    Oral  SpO2: 98% 97% 98%   Weight:      Height:        Intake/Output Summary (Last 24 hours) at 08/04/2018 0821 Last data filed at 08/04/2018 0100 Gross per 24 hour  Intake 880 ml  Output 1000 ml  Net -120 ml   Filed Weights   08/02/18 1537 08/03/18 0100  Weight: 60.3 kg 54.8 kg    Telemetry    NSR, very rare PVCs - Personally Reviewed  ECG    NSR, evolving inferior MI, less prominent inferolateral ST depression, QTC shorter at 523 ms - Personally Reviewed  Physical Exam  Appears well. GEN: No acute distress.   Neck: No JVD Cardiac: RRR, no murmurs, rubs, or gallops.  Respiratory: Clear to auscultation bilaterally. GI: Soft, nontender, non-distended  MS: No edema; No deformity. Neuro:  Nonfocal  Psych: Normal affect   Labs    Chemistry Recent Labs  Lab 08/02/18 1542 08/02/18 1614 08/03/18 0611  NA 137  135 136  K 3.2* 3.0* 3.8  CL 102 104 107  CO2  --  20* 20*  GLUCOSE 148* 123* 116*  BUN 8 8 5*  CREATININE 0.50 0.57 0.63  CALCIUM  --  8.1* 8.3*  PROT  --  4.9*  --   ALBUMIN  --  2.5*  --   AST  --  49*  --   ALT  --  16  --   ALKPHOS  --  60  --   BILITOT  --  0.6  --   GFRNONAA  --  >60 >60  GFRAA  --  >60 >60  ANIONGAP  --  11 9     Hematology Recent Labs  Lab 08/02/18 1542 08/02/18 1614 08/03/18 0611  WBC  --  8.8 13.5*  RBC  --  3.91 4.12  HGB 13.3 11.1* 11.7*  HCT 39.0 34.8* 36.7  MCV  --  89.0 89.1  MCH  --  28.4 28.4  MCHC  --  31.9 31.9  RDW  --  14.1 14.6  PLT  --  227 272    Cardiac Enzymes Recent Labs  Lab 08/02/18 1614 08/02/18 1733 08/02/18 2354 08/03/18 0611  TROPONINI 6.05* 15.41* 47.65* 51.07*   No results for  input(s): TROPIPOC in the last 168 hours.   BNPNo results for input(s): BNP, PROBNP in the last 168 hours.   DDimer No results for input(s): DDIMER in the last 168 hours.   Radiology    No results found.  Cardiac Studies   Cath 08/02/2018 Conclusion     There is mild left ventricular systolic dysfunction.  The left ventricular ejection fraction is 35-45% by visual estimate.  There is no aortic valve stenosis.  Mid RCA lesion is 20% stenosed.  Mid RCA to Dist RCA lesion is 100% stenosed.  A drug-eluting stent was successfully placed using a STENT SYNERGY DES 3X32. Post Dilated to 3.6 mm  Post intervention, there is a 0% residual stenosis.  Post Atrio lesion is 95% stenosed - distal embolization (too small & distal) -- > IV Aggrastat  Mid LM to Dist LM lesion is 35% stenosed.  Prox LAD-1 lesion is 20% stenosed. Prox LAD-2 lesion is 40% stenosed. Prox LAD-3 lesion is 20% stenosed.  CULPRIT LESION: 100% mid RCA thrombotic occlusion -> treated with Synergy DES stent (3.0 mm x 32 mm postdilated to 3.6 mm) Residual disease in the PDA of roughly 20% improved with nitroglycerin, also distal embolization of thrombus  into the distal main RPL branch with some resolution after IC nitroglycerin and Aggrastat. 30 to 40% left main disease with mild 20 to 40% mid LAD disease at takeoff of large diagonal branch. Mildly reduced LVEF of 45 to 50% with inferior hypokinesis.  Plan: Transfer to CCU for post cardiac cath care. Will run Aggrastat for 6 hours due to but distal embolization and large thrombus burden. Increase Crestor to 40 mg daily. When blood pressure tolerates, start low-dose beta-blocker and possible ACE inhibitor/ARB due to LV dysfunction. Check 2D echocardiogram prior to discharge.  Recommend uninterrupted dual antiplatelet therapy with Aspirin 81mg  daily and Ticagrelor 90mg  twice dailyfor a minimum of 12 months (ACS - Class I recommendation).     Patient Profile     57 y.o. female s/p large inferior wall STEMI treated with DES 08/02/2018, persistent 95% ostial PLV stenosis and distal embolism, complicated by NSVT, moderate LV dysfunction, QT prolongation. History of smoking and emphysema.  Assessment & Plan    1. CAD s/p inferior STEMI:  Extensive injury by ECG, troponin peaking only at approx 50. Could not have full revascularization and likely has ongoing inferolateral ischemia. Discussed mandatory DAPT x 12 months, high dose statin. She has already decided to stop smoking. 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 2. CHF, systolic:  EF is decreased and her resting tachycardia probably represents CHF, although no dyspnea at rest and no rales. Stop ARB due to low BP.Continue low dose beta blocker as tolerated by BP (prefer selective agant over carvedilol due to diagnosis of emphysema). ECG worrisome for risk of aneurysm/pseudoaneurysm formation in inferior wall, but seems to be imnproving. Echo pending. 3. Acute pericarditis: pleuritic symptoms and ECG c/w post MI pericarditis. Note history of non-ischemic acute pericarditis in 2016. Echo  still pending 4. COPD: bears diagnosis of emphysema. no active wheezing. Has committed to smoking cessation. 5. Long QT:  Persists even after correction of K and is likely due to ongoing ischemia in PLV artery territory. Avoid QT prolonging drugs.  Lives in [place], [place]. Prefers rehab at [facility]. F/U will be with Dr. Purvis Sheffield.  For questions or updates, please contact [facility] Please consult [email or web address] for contact info under  Signed, Thurmon Fair, MD  08/04/2018, 8:21 AM

## 2018-08-04 NOTE — Progress Notes (Signed)
  Echocardiogram 2D Echocardiogram has been performed.  Gerda Diss 08/04/2018, 3:28 PM

## 2018-08-05 ENCOUNTER — Encounter (HOSPITAL_COMMUNITY): Payer: Self-pay | Admitting: *Deleted

## 2018-08-05 DIAGNOSIS — I959 Hypotension, unspecified: Secondary | ICD-10-CM

## 2018-08-05 LAB — VITAMIN D 25 HYDROXY (VIT D DEFICIENCY, FRACTURES): VIT D 25 HYDROXY: 46.3 ng/mL (ref 30.0–100.0)

## 2018-08-05 MED ORDER — LEVOTHYROXINE SODIUM 112 MCG PO TABS
112.0000 ug | ORAL_TABLET | Freq: Every day | ORAL | Status: DC
  Administered 2018-08-06: 112 ug via ORAL
  Filled 2018-08-05: qty 1

## 2018-08-05 NOTE — Care Management (Signed)
08-05-18  BENEFITS CHECK :  #  2.  S/W JADA  @ [facility] # [phone]  TICAGRELOR- NONE FORMULARY  BRILINTA  90 MG BID COVER- YES CO-PAY- $ 50.00 TIER- 2 DRUG PRIOR APPROVAL- NO  PREFERRED PHARMACY :  YES [facility] # [phone]

## 2018-08-05 NOTE — Progress Notes (Signed)
CARDIAC REHAB PHASE I   PRE:  Rate/Rhythm: 80 SR  BP:  Sitting: 101/76      SaO2: 97 RA  MODE:  Ambulation: 470 ft   POST:  Rate/Rhythm: 86 SR  BP:  Sitting: 102/67    SaO2: 98 RA   Pt ambulated 48ft in hallway independently with slow steady gait. Pt denies CP, does c/o some SOB and L leg tingling. Pt given exercise guidelines. Reinforced importance of ASA and Brilinta. Pt seems motivated for smoking cessation, having minimal cravings while in hospital. Brief education on heart failure, daily weights and sodium intake. Pt feels ready for d/c. Pt referred to CRP II [place].  [id number] Whitney Bowen, RN BSN 08/05/2018 9:51 AM

## 2018-08-05 NOTE — Discharge Summary (Addendum)
Discharge Summary    Patient ID: Whitney May,  MRN: [id number], DOB/AGE: [birth year] 57 y.o.  Admit date: 08/02/2018 Discharge date: 08/06/2018  Primary Care Provider: Lia Hopping A Primary Cardiologist: Dr. Purvis Sheffield   Discharge Diagnoses    Principal Problem:   Acute ST elevation myocardial infarction (STEMI) of inferior wall [facility]) Active Problems:   Hypothyroidism   Hyperlipidemia, mixed   Tobacco abuse   Acute myocardial infarction (HCC)   Acute systolic heart failure (HCC)   Allergies Allergies  Allergen Reactions  . Penicillins Other (See Comments)    Fever, vomiting Has patient had a PCN reaction causing immediate rash, facial/tongue/throat swelling, SOB or lightheadedness with hypotension: NO Has patient had a PCN reaction causing severe rash involving mucus membranes or skin necrosis: NO Has patient had a PCN reaction that required hospitalization: NO Has patient had a PCN reaction occurring within the last 10 years: NO If all of the above answers are "NO", then may proceed Childhood allergy    Diagnostic Studies/Procedures    Cath: 08/02/18   There is mild left ventricular systolic dysfunction.  The left ventricular ejection fraction is 35-45% by visual estimate.  There is no aortic valve stenosis.  Mid RCA lesion is 20% stenosed.  Mid RCA to Dist RCA lesion is 100% stenosed.  A drug-eluting stent was successfully placed using a STENT SYNERGY DES 3X32. Post Dilated to 3.6 mm  Post intervention, there is a 0% residual stenosis.  Post Atrio lesion is 95% stenosed - distal embolization (too small & distal) -- > IV Aggrastat  Mid LM to Dist LM lesion is 35% stenosed.  Prox LAD-1 lesion is 20% stenosed. Prox LAD-2 lesion is 40% stenosed. Prox LAD-3 lesion is 20% stenosed.   CULPRIT LESION: 100% mid RCA thrombotic occlusion -> treated with Synergy DES stent (3.0 mm x 32 mm postdilated to 3.6 mm) Residual disease in the PDA of roughly 20%  improved with nitroglycerin, also distal embolization of thrombus into the distal main RPL branch with some resolution after IC nitroglycerin and Aggrastat. 30 to 40% left main disease with mild 20 to 40% mid LAD disease at takeoff of large diagonal branch. Mildly reduced LVEF of 45 to 50% with inferior hypokinesis.  Plan: Transfer to CCU for post cardiac cath care.  Will run Aggrastat for 6 hours due to but distal embolization and large thrombus burden. Increase Crestor to 40 mg daily. When blood pressure tolerates, start low-dose beta-blocker and possible ACE inhibitor/ARB due to LV dysfunction. Check 2D echocardiogram prior to discharge.  Recommend uninterrupted dual antiplatelet therapy with Aspirin 81mg  daily and Ticagrelor 90mg  twice daily for a minimum of 12 months (ACS - Class I recommendation).   Bryan Lemma, M.D., M.S.  TTE: 08/04/18  Study Conclusions  - Left ventricle: The cavity size was normal. Wall thickness was   increased in a pattern of mild LVH. Systolic function was   moderately reduced. The estimated ejection fraction was in the   range of 35% to 40%. There is akinesis of the inferolateral and   inferior myocardium. Features are consistent with a pseudonormal   left ventricular filling pattern, with concomitant abnormal   relaxation and increased filling pressure (grade 2 diastolic   dysfunction). Doppler parameters are consistent with high   ventricular filling pressure. - Mitral valve: There was mild to moderate regurgitation.  Impressions:  - Akinesis of the inferior and inferolateral walls with overall   moderate LV dysfunction; moderate diastolic dysfunction; mild  LVH; mild to moderate MR.  _____________   History of Present Illness     57 year old Caucasian female with past medical history of hyperlipidemia, hypothyroidism, EtOH abuse, tobacco abuse, and history of pericardial effusion. Patient was previously admitted in 2016 with chest  pain.  Echocardiogram at the time showed normal EF, mild to moderate pericardial effusion.  A stress test obtained in 2016 was normal.  She had not been seen by cardiology service since. For the past 3 days prior to admission, she had been having intermittent left-sided chest discomfort radiating to the left shoulder and left jaw.  The symptom recurred that morning and had not gone away since.  She called EMS and EKG showed significant ST elevation in the inferior lead concerning for inferior STEMI.  She was given 4 doses of morphine, 4000 units of IV heparin, 325 mg aspirin, and nitroglycerin.  On arrival to [facility] around 3:30 PM, she continued to have 7-8 out of 10 chest pain.  She was taken emergently to the Cath Lab for cardiac catheterization.  Her vital signs are stable on arrival.  Hospital Course     Consultants: none  Underwent cardiac cath noted above with 100% RCA thrombotic occlusion that was treated with a synergy stent. Also noted to have residual disease in the post atrio lesion at 95% that was unable to be treated given small size. Has mild nonobstructive disease in LM, and LAD with plans to treat medically. Troponin peaked at 51. She was placed on DAPT with ASA/Brilinta for at least 1 year. Home Crestor was increased to 40mg  daily. LDL noted at 92. Attempted to add low dose BB and ARB but unable to continue given her soft blood pressures. Follow up echo showed EF of 35-40% with akinesis of the inferolateral and inferior myocardium. Worked well with cardiac rehab without recurrent chest pain. Instructed to follow her blood pressures at home and keep a log to bring to follow up.   General: Well developed, well nourished, female appearing in no acute distress. Head: Normocephalic, atraumatic.  Neck: Supple without bruits, JVD. Lungs:  Resp regular and unlabored, CTA. Heart: RRR, S1, S2, no S3, S4, or murmur; no rub. Abdomen: Soft, non-tender, non-distended with normoactive  bowel sounds. No hepatomegaly. No rebound/guarding. No obvious abdominal masses. Extremities: No clubbing, cyanosis, edema. Distal pedal pulses are 2+ bilaterally. R radial cath site stable without bruising or hematoma Neuro: Alert and oriented X 3. Moves all extremities spontaneously. Psych: Normal affect.  [name] was seen by Dr. Eldridge Dace and determined stable for discharge home. Follow up in the office has been arranged. Medications are listed below.   _____________  Discharge Vitals Blood pressure 115/74, pulse 88, temperature 97.7 F (36.5 C), temperature source Oral, resp. rate 18, height 5\' 2"  (1.575 m), weight 53.3 kg, SpO2 (!) 87 %.  Filed Weights   08/03/18 0100 08/05/18 0624 08/06/18 0611  Weight: 54.8 kg 54.8 kg 53.3 kg    Labs & Radiologic Studies    CBC No results for input(s): WBC, NEUTROABS, HGB, HCT, MCV, PLT in the last 72 hours. Basic Metabolic Panel No results for input(s): NA, K, CL, CO2, GLUCOSE, BUN, CREATININE, CALCIUM, MG, PHOS in the last 72 hours. Liver Function Tests No results for input(s): AST, ALT, ALKPHOS, BILITOT, PROT, ALBUMIN in the last 72 hours. No results for input(s): LIPASE, AMYLASE in the last 72 hours. Cardiac Enzymes No results for input(s): CKTOTAL, CKMB, CKMBINDEX, TROPONINI in the last 72 hours. BNP  Invalid input(s): POCBNP D-Dimer No results for input(s): DDIMER in the last 72 hours. Hemoglobin A1C No results for input(s): HGBA1C in the last 72 hours. Fasting Lipid Panel No results for input(s): CHOL, HDL, LDLCALC, TRIG, CHOLHDL, LDLDIRECT in the last 72 hours. Thyroid Function Tests No results for input(s): TSH, T4TOTAL, T3FREE, THYROIDAB in the last 72 hours.  Invalid input(s): FREET3 _____________  No results found. Disposition   Pt is being discharged home today in good condition.  Follow-up Plans & Appointments    Follow-up Information    Jodelle Gross, NP Follow up on 08/14/2018.   Specialties:  Nurse  Practitioner, Radiology, Cardiology Why:  at 10:30am for your follow up appt.  Contact information: [address] [place] [address] [phone]          Discharge Instructions    Amb Referral to Cardiac Rehabilitation   Complete by:  As directed    Diagnosis:   STEMI Coronary Stents     Call MD for:  redness, tenderness, or signs of infection (pain, swelling, redness, odor or green/yellow discharge around incision site)   Complete by:  As directed    Diet - low sodium heart healthy   Complete by:  As directed    Discharge instructions   Complete by:  As directed    Radial Site Care Refer to this sheet in the next few weeks. These instructions provide you with information on caring for yourself after your procedure. Your caregiver may also give you more specific instructions. Your treatment has been planned according to current medical practices, but problems sometimes occur. Call your caregiver if you have any problems or questions after your procedure. HOME CARE INSTRUCTIONS You may shower the day after the procedure.Remove the bandage (dressing) and gently wash the site with plain soap and water.Gently pat the site dry.  Do not apply powder or lotion to the site.  Do not submerge the affected site in water for 3 to 5 days.  Inspect the site at least twice daily.  Do not flex or bend the affected arm for 24 hours.  No lifting over 5 pounds (2.3 kg) for 5 days after your procedure.  Do not drive home if you are discharged the same day of the procedure. Have someone else drive you.  You may drive 24 hours after the procedure unless otherwise instructed by your caregiver.  What to expect: Any bruising will usually fade within 1 to 2 weeks.  Blood that collects in the tissue (hematoma) may be painful to the touch. It should usually decrease in size and tenderness within 1 to 2 weeks.  SEEK IMMEDIATE MEDICAL CARE IF: You have unusual pain at the radial site.    You have redness, warmth, swelling, or pain at the radial site.  You have drainage (other than a small amount of blood on the dressing).  You have chills.  You have a fever or persistent symptoms for more than 72 hours.  You have a fever and your symptoms suddenly get worse.  Your arm becomes pale, cool, tingly, or numb.  You have heavy bleeding from the site. Hold pressure on the site.   PLEASE DO NOT MISS ANY DOSES OF YOUR BRILINTA!!!!! Also keep a log of you blood pressures and bring back to your follow up appt. Please call the office with any questions.   Patients taking blood thinners should generally stay away from medicines like ibuprofen, Advil, Motrin, naproxen, and Aleve due to risk  of stomach bleeding. You may take Tylenol as directed or talk to your primary doctor about alternatives.   Increase activity slowly   Complete by:  As directed      Discharge Medications     Medication List    STOP taking these medications   folic acid 1 MG tablet Commonly known as:  FOLVITE   naproxen sodium 220 MG tablet Commonly known as:  ALEVE   thiamine 100 MG tablet     TAKE these medications   aspirin 81 MG chewable tablet Chew 1 tablet (81 mg total) by mouth daily. Start taking on:  08/07/2018   hydroxypropyl methylcellulose / hypromellose 2.5 % ophthalmic solution Commonly known as:  ISOPTO TEARS / GONIOVISC Place 1 drop into both eyes as needed for dry eyes.   levothyroxine 112 MCG tablet Commonly known as:  SYNTHROID, LEVOTHROID Take 1 tablet by mouth daily.   LORazepam 0.5 MG tablet Commonly known as:  ATIVAN Take 0.5 mg by mouth at bedtime.   multivitamin with minerals Tabs tablet Take 1 tablet by mouth daily.   nicotine 21 mg/24hr patch Commonly known as:  NICODERM CQ - dosed in mg/24 hours Place 1 patch (21 mg total) onto the skin daily.   nitroGLYCERIN 0.4 MG SL tablet Commonly known as:  NITROSTAT Place 1 tablet (0.4 mg total) under the tongue every 5  (five) minutes x 3 doses as needed for chest pain.   rosuvastatin 40 MG tablet Commonly known as:  CRESTOR Take 1 tablet (40 mg total) by mouth daily. Start taking on:  08/07/2018 What changed:    medication strength  how much to take   ticagrelor 90 MG Tabs tablet Commonly known as:  BRILINTA Take 1 tablet (90 mg total) by mouth 2 (two) times daily.       Acute coronary syndrome (MI, NSTEMI, STEMI, etc) this admission?: Yes.     AHA/ACC Clinical Performance & Quality Measures: 1. Aspirin prescribed? - Yes 2. ADP Receptor Inhibitor (Plavix/Clopidogrel, Brilinta/Ticagrelor or Effient/Prasugrel) prescribed (includes medically managed patients)? - Yes 3. Beta Blocker prescribed? - No - blood pressure to soft.  4. High Intensity Statin (Lipitor 40-80mg  or Crestor 20-40mg ) prescribed? - Yes 5. EF assessed during THIS hospitalization? - Yes 6. For EF <40%, was ACEI/ARB prescribed? - No - Reason:  blood pressure to soft 7. For EF <40%, Aldosterone Antagonist (Spironolactone or Eplerenone) prescribed? - No - Reason:  blood pressure to soft 8. Cardiac Rehab Phase II ordered (Included Medically managed Patients)? - Yes     Outstanding Labs/Studies   FLP/LFTS in 6 weeks if tolerating statin.   Duration of Discharge Encounter   Greater than 30 minutes including physician time.  Signed, Laverda Page NP-C 08/06/2018, 11:01 AM   I have examined the patient and reviewed assessment and plan and discussed with patient.  Agree with above as stated.    GEN: Well nourished, well developed, in no acute distress  HEENT: normal  Neck: no JVD, carotid bruits, or masses Cardiac: RRR; no murmurs, rubs, or gallops,no edema  Respiratory:  clear to auscultation bilaterally, normal work of breathing GI: soft, nontender, nondistended,  MS: no deformity or atrophy  Skin: warm and dry, no rash Neuro:  Strength and sensation are intact Psych: euthymic mood, full affect  No beta blocker or ACE-I  due to hypotension.  She feels well today. She needs DAPT.  I stressed the importance of DAPT.  Will plan discharge today.  SHe needs to stop smoking as  well.   Larae Grooms

## 2018-08-05 NOTE — Progress Notes (Addendum)
Progress Note  Patient Name: Whitney May Date of Encounter: 08/05/2018  Primary Cardiologist: Prentice Docker, MD  Subjective   Feeling well this morning. No chest pain or dyspnea.   Inpatient Medications    Scheduled Meds: . aspirin  81 mg Oral Daily  . Chlorhexidine Gluconate Cloth  6 each Topical Q0600  . levothyroxine  112 mcg Oral Daily  . metoprolol tartrate  12.5 mg Oral BID  . mupirocin ointment  1 application Nasal BID  . nicotine  21 mg Transdermal Daily  . rosuvastatin  40 mg Oral Daily  . sodium chloride flush  3 mL Intravenous Q12H  . ticagrelor  90 mg Oral BID   Continuous Infusions: . sodium chloride     PRN Meds: sodium chloride, acetaminophen, hydroxypropyl methylcellulose / hypromellose, nitroGLYCERIN, ondansetron (ZOFRAN) IV, sodium chloride flush   Vital Signs    Vitals:   08/05/18 0830 08/05/18 0831 08/05/18 0833 08/05/18 0835  BP: (!) 83/60 (!) 86/60 (!) 83/56 (!) 83/56  Pulse:      Resp:      Temp:      TempSrc:      SpO2:      Weight:      Height:        Intake/Output Summary (Last 24 hours) at 08/05/2018 0844 Last data filed at 08/04/2018 2111 Gross per 24 hour  Intake 243 ml  Output -  Net 243 ml   Filed Weights   08/02/18 1537 08/03/18 0100 08/05/18 0624  Weight: 60.3 kg 54.8 kg 54.8 kg    Telemetry    SR - Personally Reviewed  ECG    N/a - Personally Reviewed  Physical Exam   General: Well developed, well nourished, female appearing in no acute distress. Head: Normocephalic, atraumatic.  Neck: Supple without bruits, JVD. Lungs:  Resp regular and unlabored, CTA. Heart: RRR, S1, S2, no S3, S4, or murmur; no rub. Abdomen: Soft, non-tender, non-distended with normoactive bowel sounds. Extremities: No clubbing, cyanosis, edema. Distal pedal pulses are 2+ bilaterally. Right radial site stable.  Neuro: Alert and oriented X 3. Moves all extremities spontaneously. Psych: Normal affect.  Labs    Chemistry Recent  Labs  Lab 08/02/18 1542 08/02/18 1614 08/03/18 0611  NA 137 135 136  K 3.2* 3.0* 3.8  CL 102 104 107  CO2  --  20* 20*  GLUCOSE 148* 123* 116*  BUN 8 8 5*  CREATININE 0.50 0.57 0.63  CALCIUM  --  8.1* 8.3*  PROT  --  4.9*  --   ALBUMIN  --  2.5*  --   AST  --  49*  --   ALT  --  16  --   ALKPHOS  --  60  --   BILITOT  --  0.6  --   GFRNONAA  --  >60 >60  GFRAA  --  >60 >60  ANIONGAP  --  11 9     Hematology Recent Labs  Lab 08/02/18 1542 08/02/18 1614 08/03/18 0611  WBC  --  8.8 13.5*  RBC  --  3.91 4.12  HGB 13.3 11.1* 11.7*  HCT 39.0 34.8* 36.7  MCV  --  89.0 89.1  MCH  --  28.4 28.4  MCHC  --  31.9 31.9  RDW  --  14.1 14.6  PLT  --  227 272    Cardiac Enzymes Recent Labs  Lab 08/02/18 1614 08/02/18 1733 08/02/18 2354 08/03/18 0611  TROPONINI 6.05* 15.41* 47.65* 51.07*  No results for input(s): TROPIPOC in the last 168 hours.   BNPNo results for input(s): BNP, PROBNP in the last 168 hours.   DDimer No results for input(s): DDIMER in the last 168 hours.    Radiology    No results found.  Cardiac Studies   Cath: 08/02/18   There is mild left ventricular systolic dysfunction.  The left ventricular ejection fraction is 35-45% by visual estimate.  There is no aortic valve stenosis.  Mid RCA lesion is 20% stenosed.  Mid RCA to Dist RCA lesion is 100% stenosed.  A drug-eluting stent was successfully placed using a STENT SYNERGY DES 3X32. Post Dilated to 3.6 mm  Post intervention, there is a 0% residual stenosis.  Post Atrio lesion is 95% stenosed - distal embolization (too small & distal) -- > IV Aggrastat  Mid LM to Dist LM lesion is 35% stenosed.  Prox LAD-1 lesion is 20% stenosed. Prox LAD-2 lesion is 40% stenosed. Prox LAD-3 lesion is 20% stenosed.   CULPRIT LESION: 100% mid RCA thrombotic occlusion -> treated with Synergy DES stent (3.0 mm x 32 mm postdilated to 3.6 mm) Residual disease in the PDA of roughly 20% improved with  nitroglycerin, also distal embolization of thrombus into the distal main RPL branch with some resolution after IC nitroglycerin and Aggrastat. 30 to 40% left main disease with mild 20 to 40% mid LAD disease at takeoff of large diagonal branch. Mildly reduced LVEF of 45 to 50% with inferior hypokinesis.  Plan: Transfer to CCU for post cardiac cath care.  Will run Aggrastat for 6 hours due to but distal embolization and large thrombus burden. Increase Crestor to 40 mg daily. When blood pressure tolerates, start low-dose beta-blocker and possible ACE inhibitor/ARB due to LV dysfunction. Check 2D echocardiogram prior to discharge.  Recommend uninterrupted dual antiplatelet therapy with Aspirin 81mg  daily and Ticagrelor 90mg  twice daily for a minimum of 12 months (ACS - Class I recommendation).   I anticipate that she be ready to go home at the earliest Sunday evening, potentially Monday morning.  David Harding, M.D., M.S.  TTE: 08/04/18  Study Conclusions  - Left ventricle: The cavity size was normal. Wall thickness was   increased in a pattern of mild LVH. Systolic function was   moderately reduced. The estimated ejection fraction was in the   range of 35% to 40%. There is akinesis of the inferolateral and   inferior myocardium. Features are consistent with a pseudonormal   left ventricular filling pattern, with concomitant abnormal   relaxation and increased filling pressure (grade 2 diastolic   dysfunction). Doppler parameters are consistent with high   ventricular filling pressure. - Mitral valve: There was mild to moderate regurgitation.  Impressions:  - Akinesis of the inferior and inferolateral walls with overall   moderate LV dysfunction; moderate diastolic dysfunction; mild   LVH; mild to moderate MR.  Patient Profile     57  y.o. female s/p large inferior wall STEMI treated with DES 08/02/2018,persistent 95% ostial PLV stenosis and distal embolism,complicated by  NSVT, moderate LV dysfunction, QT prolongation. History of smoking and emphysema.  Assessment & Plan    1. CAD s/p inferior STEMI: Extensive injury by ECG, troponin peaking only at approx 50. Could not have full revascularization and had some chest pain yesterday. Much improved today. Discussed mandatory DAPT x 12 months, high dose statin. Needs to work with cardiac rehab today, as she has not walked in the hallways. Plan medical therapy for residual disease.  2. CHF, systolic: EF is decreased at 35-40% on echo with akinesis in the inferior and inferolateral wall. ARB  Was stopped 2/2 due to low BP yesterday. Attempted to continue on metoprolol but blood pressures in the 80s this morning. Will hold this morning. Follow up blood pressures after ambulation.   3. Acute pericarditis: pleuritic symptoms and ECG c/w post MI pericarditis. Note history of non-ischemic acute pericarditis in 2016. Chest pain improved.   4. COPD: bears diagnosis of emphysema. no active wheezing. Has committed to smoking cessation.  5. Long QT:  Persists even after correction of K and is likely due to ongoing ischemia in PLV artery territory. Avoid QT prolonging drugs. -- recheck EKG this morning.   Signed, Laverda Page, NP  08/05/2018, 8:44 AM  Pager # [phone]   For questions or updates, please contact [facility] Please consult [email or web address] for contact info under Cardiology/STEMI.   I have examined the patient and reviewed assessment and plan and discussed with patient.  Agree with above as stated.  BPs low at times.  Will have to hold metoprolol and ACE-I unfortunately as she is symptomatic.  Needs to stop smoking.  Will see how she does walking today.  If she feels ok, could consider discharge.   Lance Muss

## 2018-08-06 DIAGNOSIS — E782 Mixed hyperlipidemia: Secondary | ICD-10-CM

## 2018-08-06 DIAGNOSIS — E039 Hypothyroidism, unspecified: Secondary | ICD-10-CM

## 2018-08-06 DIAGNOSIS — I5021 Acute systolic (congestive) heart failure: Secondary | ICD-10-CM

## 2018-08-06 MED ORDER — TICAGRELOR 90 MG PO TABS: 90.0000 mg | ORAL_TABLET | Freq: Two times a day (BID) | ORAL | 1 refills | Status: DC

## 2018-08-06 MED ORDER — NITROGLYCERIN 0.4 MG SL SUBL: 0.4000 mg | SUBLINGUAL_TABLET | SUBLINGUAL | 1 refills | Status: DC | PRN

## 2018-08-06 MED ORDER — ROSUVASTATIN CALCIUM 40 MG PO TABS: 40.0000 mg | ORAL_TABLET | Freq: Every day | ORAL | 1 refills | Status: DC

## 2018-08-06 MED ORDER — ASPIRIN 81 MG PO CHEW: 81.0000 mg | CHEWABLE_TABLET | Freq: Every day | ORAL | Status: DC

## 2018-08-06 MED FILL — ROSUVASTATIN CALCIUM 40 MG: 40 | 30 days supply | Qty: 30 | Fill #0 | Status: TO

## 2018-08-06 MED FILL — NITROGLYCERIN 0.4 MG TAB SL: 0.4 | 25 days supply | Qty: 25 | Fill #0 | Status: TO

## 2018-08-06 MED FILL — BRILINTA 90 MG TABLET: 90 | 60 days supply | Qty: 60 | Fill #0 | Status: TO

## 2018-08-06 NOTE — Care Management Note (Signed)
Case Management Note  Patient Details  Name: Whitney May MRN: [id number] Date of Birth: [birth year]  Subjective/Objective:  Pt presented for Acute Stemi- plan for home on Brilinta. Benefits Check completed. CM will provide patient with co pay card for Brilinta.                   Action/Plan: No home needs identified at this time. Plan for TOC to deliver medications to patient prior to transition home. No further needs from CM at this time.    Expected Discharge Date:  08/05/18               Expected Discharge Plan:  Home/Self Care  In-House Referral:  NA  Discharge planning Services  CM Consult, Medication Assistance  Post Acute Care Choice:  NA Choice offered to:  NA  DME Arranged:  N/A DME Agency:  NA  HH Arranged:  NA HH Agency:  NA  Status of Service:  Completed, signed off  If discussed at Long Length of Stay Meetings, dates discussed:    Additional Comments:  Gala Lewandowsky, RN 08/06/2018, 11:24 AM

## 2018-08-06 NOTE — Progress Notes (Signed)
CARDIAC REHAB PHASE I   PRE:  Rate/Rhythm: 89 SR  BP:  Sitting: 115/74       Offered to walk with pt, pt states she has been ambulating independently in hallways. Pt denies CP, SOB, or "woozy" feeling. Reinforced d/c ed with pt. Pt with no questions or concerns at this time, eager for d/c. Pt referred for CRP II [facility].   [phone] Reynold Bowen, RN BSN 08/06/2018 10:41 AM

## 2018-08-13 NOTE — Progress Notes (Signed)
Cardiology Office Note   Date:  08/14/2018   ID:  Whitney May, DOB [birth year], MRN [id number]  PCP:  Toma Deiters, MD  Cardiologist:  Dr. Purvis Sheffield   Chief Complaint  Patient presents with  . Follow-up  . Hospitalization Follow-up  . Coronary Artery Disease    s/p inferior MI Stent to RCA     History of Present Illness: Whitney May is a 57 y.o. female who presents for post hospitalization follow up after admission  Inferior STEMI with complaints of chest pain, with left shoulder and left jaw pain. She had emergent cardiac cath which revealed a 100% RCA occlusion treated with Synergy stent.  The post atrio lesion at 95% was not amendable to treatment given small size.   Post cath, she was placed on DAPT with Brilinta and ASA. She was not started on BB due to hypotension. Echo demonstrated EF of 35%-40% with akinesis of the inferolateral and inferior myocardium.   Other history includes ETOH abuse, tobacco abuse, and pericardial effusion.   She continues to feel tired and easily fatigued. She denies recurrent chest pain. She is working on smoking cessation, using Nicoderm patches. She has brought with her a large amount of paperwork from her work place to be filled out. 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    Past Medical History:  Diagnosis Date  . Alcohol use   . Chest pain, unspecified   . Hyperlipidemia, mixed   . Hypothyroidism   . Tobacco abuse     Past Surgical History:  Procedure Laterality Date  . CESAREAN SECTION    . CORONARY/GRAFT ACUTE MI REVASCULARIZATION N/A 08/02/2018   Procedure: Coronary/Graft Acute MI Revascularization;  Surgeon: Marykay Lex, MD;  Location: [facility] IN[facility]AS[facility] CV LAB;  Service: Cardiovascular;  Laterality: N/A;  . LEFT HEART CATH AND CORONARY ANGIOGRAPHY N/A 08/02/2018   Procedure: LEFT HEART CATH AND CORONARY ANGIOGRAPHY;  Surgeon: Marykay Lex, MD;  Location: [facility] INVASIVE  CV LAB;  Service: Cardiovascular;  Laterality: N/A;     Current Outpatient Medications  Medication Sig Dispense Refill  . aspirin 81 MG chewable tablet Chew 1 tablet (81 mg total) by mouth daily.    . hydroxypropyl methylcellulose / hypromellose (ISOPTO TEARS / GONIOVISC) 2.5 % ophthalmic solution Place 1 drop into both eyes as needed for dry eyes.    Marland Kitchen levothyroxine (SYNTHROID, LEVOTHROID) 112 MCG tablet Take 1 tablet by mouth daily.    Marland Kitchen LORazepam (ATIVAN) 0.5 MG tablet Take 0.5 mg by mouth at bedtime.  2  . Multiple Vitamin (MULTIVITAMIN WITH MINERALS) TABS tablet Take 1 tablet by mouth daily. 30 tablet 0  . nicotine (NICODERM CQ - DOSED IN MG/24 HOURS) 21 mg/24hr patch Place 1 patch (21 mg total) onto the skin daily. 28 patch 0  . nitroGLYCERIN (NITROSTAT) 0.4 MG SL tablet Place 1 tablet (0.4 mg total) under the tongue every 5 (five) minutes x 3 doses as needed for chest pain. 25 tablet 1  . rosuvastatin (CRESTOR) 40 MG tablet Take 1 tablet (40 mg total) by mouth daily. 90 tablet 1  . ticagrelor (BRILINTA) 90 MG TABS tablet Take 1 tablet (90 mg total) by mouth 2 (two) times daily. 180 tablet 1   No current facility-administered medications for this visit.     Allergies:   Penicillins    Social History:  The patient  reports that she quit smoking 5 days ago. Her smoking use included  cigarettes. She has a 22.00 pack-year smoking history. She has never used smokeless tobacco. She reports that she drinks about 8.0 standard drinks of alcohol per week. She reports that she does not use drugs.   Family History:  The patient's family history includes Coronary artery disease in her maternal grandmother and mother; Diabetes in her father; Hypertension in her father.    ROS: All other systems are reviewed and negative. Unless otherwise mentioned in H&P    PHYSICAL EXAM: VS:  BP 100/66   Pulse 67   Ht 5\' 2"  (1.575 m)   Wt 120 lb 6.4 oz (54.6 kg)   BMI 22.02 kg/m  , BMI Body mass index is  22.02 kg/m. GEN: Well nourished, well developed, in no acute distress HEENT: normal Neck: no JVD, carotid bruits, or masses Cardiac: RRR; no murmurs, rubs, or gallops,no edema  Respiratory:  Clear to auscultation bilaterally, normal work of breathing GI: soft, nontender, nondistended, + BS MS: no deformity or atrophy Skin: warm and dry, no rash Neuro:  Strength and sensation are intact Psych: euthymic mood, full affect   EKG:   NSR with LBBB. Rate of 67 bpm.   Recent Labs: 08/02/2018: ALT 16 08/03/2018: BUN 5; Creatinine, Ser 0.63; Hemoglobin 11.7; Platelets 272; Potassium 3.8; Sodium 136    Lipid Panel    Component Value Date/Time   CHOL 145 08/02/2018 2354   TRIG 185 (H) 08/02/2018 2354   HDL 27 (L) 08/02/2018 2354   CHOLHDL 5.4 08/02/2018 2354   VLDL 37 08/02/2018 2354   LDLCALC 81 08/02/2018 2354      Wt Readings from Last 3 Encounters:  08/14/18 120 lb 6.4 oz (54.6 kg)  08/06/18 117 lb 8.1 oz (53.3 kg)  09/24/15 133 lb (60.3 kg)      Other studies Reviewed: Cath: 08/02/18   There is mild left ventricular systolic dysfunction.  The left ventricular ejection fraction is 35-45% by visual estimate.  There is no aortic valve stenosis.  Mid RCA lesion is 20% stenosed.  Mid RCA to Dist RCA lesion is 100% stenosed.  A drug-eluting stent was successfully placed using a STENT SYNERGY DES 3X32. Post Dilated to 3.6 mm  Post intervention, there is a 0% residual stenosis.  Post Atrio lesion is 95% stenosed - distal embolization (too small & distal) -- > IV Aggrastat  Mid LM to Dist LM lesion is 35% stenosed.  Prox LAD-1 lesion is 20% stenosed. Prox LAD-2 lesion is 40% stenosed. Prox LAD-3 lesion is 20% stenosed.  CULPRIT LESION: 100% mid RCA thrombotic occlusion -> treated with Synergy DES stent (3.0 mm x 32 mm postdilated to 3.6 mm) Residual disease in the PDA of roughly 20% improved with nitroglycerin, also distal embolization of thrombus into the distal main  RPL branch with some resolution after IC nitroglycerin and Aggrastat. 30 to 40% left main disease with mild 20 to 40% mid LAD disease at takeoff of large diagonal branch. Mildly reduced LVEF of 45 to 50% with inferior hypokinesis.  Plan: Transfer to CCU for post cardiac cath care. Will run Aggrastat for 6 hours due to but distal embolization and large thrombus burden. Increase Crestor to 40 mg daily. When blood pressure tolerates, start low-dose beta-blocker and possible ACE inhibitor/ARB due to LV dysfunction. Check 2D echocardiogram prior to discharge.  Recommend uninterrupted dual antiplatelet therapy with Aspirin 81mg  daily and Ticagrelor 90mg  twice dailyfor a minimum of 12 months (ACS - Class I recommendation).  Bryan Lemma, M.D., M.S.  TTE: 08/04/18  Study Conclusions  - Left ventricle: The cavity size was normal. Wall thickness was increased in a pattern of mild LVH. Systolic function was moderately reduced. The estimated ejection fraction was in the range of 35% to 40%. There is akinesis of the inferolateral and inferior myocardium. Features are consistent with a pseudonormal left ventricular filling pattern, with concomitant abnormal relaxation and increased filling pressure (grade 2 diastolic dysfunction). Doppler parameters are consistent with high ventricular filling pressure. - Mitral valve: There was mild to moderate regurgitation.  Impressions:  - Akinesis of the inferior and inferolateral walls with overall moderate LV dysfunction; moderate diastolic dysfunction; mild LVH; mild to moderate MR.  ASSESSMENT AND PLAN:  1.  CAD: S/P acute inferior MI. S/P cardiac with 100% RCA stenosis, requiring DES stent. She is now on DAPT, and tolerating this well without complaints of bleeding or dyspnea. She is recommended for cardiac rehab.   2. Hypercholesterolemia: She is now on rosuvastatin. She will have follow up Lipids and LFT;s.   3.  Systolic CHF:  Reduced EF of 35%-40%. She is very tired and will need to be out of work for 6 weeks. Letter is provided. She is also having FMLA paperwork completed. Repeat echo in 3 months. She is not on BB and ACE due to hypotension while hospitalized.BP is 100/66 today. Will follow on next appointment.   4.Tobacco Abuse: She is working on cessation. She is using nicotine patches for assistance. She is congratulated on this lifestyle change.   Current medicines are reviewed at length with the patient today. Greater than 45 minutes spent with this patient including meeting with [facility] coordinator.   Labs/ tests ordered today include: Lipids and LFTs, and Echo (futrure).   Bettey Mare. Liborio Nixon, ANP, AACC   08/14/2018 11:22 AM    [facility] [address] Office [phone] Fax [phone]

## 2018-08-14 ENCOUNTER — Ambulatory Visit (INDEPENDENT_AMBULATORY_CARE_PROVIDER_SITE_OTHER): Payer: BLUE CROSS/BLUE SHIELD | Admitting: Adult Health

## 2018-08-14 ENCOUNTER — Telehealth: Payer: Self-pay

## 2018-08-14 ENCOUNTER — Encounter: Payer: Self-pay | Admitting: Adult Health

## 2018-08-14 VITALS — BP 100/66 | HR 67 | Ht 62.0 in | Wt 120.4 lb

## 2018-08-14 DIAGNOSIS — Z79899 Other long term (current) drug therapy: Secondary | ICD-10-CM | POA: Diagnosis not present

## 2018-08-14 DIAGNOSIS — I2510 Atherosclerotic heart disease of native coronary artery without angina pectoris: Secondary | ICD-10-CM | POA: Diagnosis not present

## 2018-08-14 DIAGNOSIS — E7800 Pure hypercholesterolemia, unspecified: Secondary | ICD-10-CM

## 2018-08-14 DIAGNOSIS — I5023 Acute on chronic systolic (congestive) heart failure: Secondary | ICD-10-CM | POA: Diagnosis not present

## 2018-08-14 DIAGNOSIS — Z720 Tobacco use: Secondary | ICD-10-CM

## 2018-08-14 MED ORDER — TICAGRELOR 90 MG PO TABS: 90.0000 mg | ORAL_TABLET | Freq: Two times a day (BID) | ORAL | 1 refills | Status: DC

## 2018-08-14 MED ORDER — ROSUVASTATIN CALCIUM 40 MG PO TABS: 40.0000 mg | ORAL_TABLET | Freq: Every day | ORAL | 1 refills | Status: DC

## 2018-08-14 NOTE — Patient Instructions (Addendum)
Medication Instructions:  NO CHANGES- Your physician recommends that you continue on your current medications as directed. Please refer to the Current Medication list given to you today.  If you need a refill on your cardiac medications before your next appointment, please call your pharmacy.  Labwork: FASTING BMET AND LIPID IN 6 WEEKS HERE IN OUR [facility] LAB  If you have labs (blood work) drawn today and your tests are completely normal, you will receive your results only by: Marland Kitchen MyChart Message (if you have MyChart) OR . A paper copy in the mail If you have any lab test that is abnormal or we need to change your treatment, we will call you to review the results.  Special Instructions: Out of work 6 weeks  Follow-Up: At [facility], you and your health needs are our priority.  As part of our continuing mission to provide you with exceptional heart care, we have created designated Provider Care Teams.  These Care Teams include your primary Cardiologist (physician) and Advanced Practice Providers (APPs -  Physician Assistants and Nurse Practitioners) who all work together to provide you with the care you need, when you need it. . You will need a follow up appointment in 1 month.  You may see  DR Purvis Sheffield or one of the following Advanced Practice Providers on your designated Care Team:   . Theodore Demark, PA-C . Joni Reining, DNP, ANP   Thank you for choosing [facility] at [facility]!!

## 2018-08-14 NOTE — Telephone Encounter (Signed)
08/14/2018 Patient walked in with 2 forms  of FMLA [employer]  Paperwork, I had her sign release form and collected a 50 dollar check I then sent it interoffice mail to [employer] to processed.  cbr

## 2018-08-26 ENCOUNTER — Telehealth: Payer: Self-pay | Admitting: Cardiology

## 2018-08-26 MED ORDER — CLOPIDOGREL BISULFATE 75 MG PO TABS: ORAL_TABLET | ORAL | 3 refills | Status: DC

## 2018-08-26 NOTE — Telephone Encounter (Signed)
Returned call to patient of Dr. Purvis Sheffield (who had cath by Dr. Herbie Baltimore 9/27). She states she has a Brilinta co-pay card but her medication is still too costly. She would like to change to clopidogrel which will be free for her with insurance. Advised Dr. Herbie Baltimore is out of the office today - she requested I send message to K. Lawrence DNP as well. Will also cc primary cardiologist as an Lorain Childes

## 2018-08-26 NOTE — Telephone Encounter (Signed)
That is the correct transition - from Brilinta to Plavix.  Bryan Lemma, MD

## 2018-08-26 NOTE — Telephone Encounter (Signed)
She will need to be changed to Plavix 75 mg daily, but will need to take 4 tablets the first day (reloading) after stopping Brilinta, the day after her last dose of it,  and then Plavix  75 mg daily thereafter.

## 2018-08-26 NOTE — Telephone Encounter (Signed)
Patient called with NP recommendations. She agreed w/plan & verbalized understanding. Rx(s) sent to pharmacy electronically.

## 2018-08-26 NOTE — Telephone Encounter (Signed)
New Message:    Pt wants to know if she can change from Brilinta to Clopidogrel. She says the Marden Noble is too expensive and her insurance will pay for Clopidogrel.

## 2018-09-12 NOTE — Progress Notes (Signed)
Cardiology Office Note   Date:  09/16/2018   ID:  Whitney May, DOB 1961/01/17, MRN [id number]  PCP:  Toma Deiters, MD  Cardiologist: Dr. Purvis Sheffield  Chief Complaint  Patient presents with  . Cardiomyopathy  . Coronary Artery Disease     History of Present Illness: Whitney May is a 57 y.o. female who presents for ongoing assessment and management of CAD, with cardiac cath in 07/2018 demonstrating 100% RCA occlusion treated with Synergy DES. Post cath, she was placed on DAPT with Brilinta and ASA. She was not started on BB due to hypotension. Echo demonstrated EF of 35%-40% with akinesis of the inferolateral and inferior myocardium.   Other history includes ETOH abuse, tobacco abuse, and pericardial effusion. Lasr seen in the office on 08/14/2018 complaining of fatigue. She was changed from Brilinta to Plavix due to dyspnea and reloaded on 08/26/2018. She unfortunately continues to smoke, and hypercholesterolemia;   She is here today complaining of fatigue. She denies chest pain, DOE, or dizziness. She states that her lungs hurt when she sneezes. She unfortunately continues to smoke. She is still out of work. She is medically compliant.   Past Medical History:  Diagnosis Date  . Alcohol use   . Chest pain, unspecified   . Hyperlipidemia, mixed   . Hypothyroidism   . Tobacco abuse     Past Surgical History:  Procedure Laterality Date  . CESAREAN SECTION    . CORONARY/GRAFT ACUTE MI REVASCULARIZATION N/A 08/02/2018   Procedure: Coronary/Graft Acute MI Revascularization;  Surgeon: Marykay Lex, MD;  Location: [facility] INVASIVE CV LAB;  Service: Cardiovascular;  Laterality: N/A;  . LEFT HEART CATH AND CORONARY ANGIOGRAPHY N/A 08/02/2018   Procedure: LEFT HEART CATH AND CORONARY ANGIOGRAPHY;  Surgeon: Marykay Lex, MD;  Location: [facility] INVASIVE CV LAB;  Service: Cardiovascular;  Laterality: N/A;     Current Outpatient Medications  Medication Sig Dispense Refill  . aspirin 81 MG  chewable tablet Chew 1 tablet (81 mg total) by mouth daily.    . clopidogrel (PLAVIX) 75 MG tablet Take 4 tablets (300mg ) by mouth on DAY 1 of medication then take 1 tablet tablet thereafter. 90 tablet 3  . hydroxypropyl methylcellulose / hypromellose (ISOPTO TEARS / GONIOVISC) 2.5 % ophthalmic solution Place 1 drop into both eyes as needed for dry eyes.    Marland Kitchen levothyroxine (SYNTHROID, LEVOTHROID) 150 MCG tablet Take 150 mcg by mouth daily.    Marland Kitchen LORazepam (ATIVAN) 0.5 MG tablet Take 0.5 mg by mouth at bedtime.  2  . Multiple Vitamin (MULTIVITAMIN WITH MINERALS) TABS tablet Take 1 tablet by mouth daily. 30 tablet 0  . nicotine (NICODERM CQ - DOSED IN MG/24 HOURS) 21 mg/24hr patch Place 1 patch (21 mg total) onto the skin daily. 28 patch 0  . nitroGLYCERIN (NITROSTAT) 0.4 MG SL tablet Place 1 tablet (0.4 mg total) under the tongue every 5 (five) minutes x 3 doses as needed for chest pain. 25 tablet 1  . rosuvastatin (CRESTOR) 40 MG tablet Take 1 tablet (40 mg total) by mouth daily. 90 tablet 1  . HYDROCORTISONE, TOPICAL, 2 % LOTN Apply 1 Product topically 3 (three) times daily. TO AFFECTED AREA 2 Bottle 0   No current facility-administered medications for this visit.     Allergies:   Penicillins    Social History:  The patient  reports that she quit smoking about 5 weeks ago. Her smoking use included cigarettes. She has a 22.00 pack-year smoking history. She has  never used smokeless tobacco. She reports that she drinks about 8.0 standard drinks of alcohol per week. She reports that she does not use drugs.   Family History:  The patient's family history includes Coronary artery disease in her maternal grandmother and mother; Diabetes in her father; Hypertension in her father.    ROS: All other systems are reviewed and negative. Unless otherwise mentioned in H&P    PHYSICAL EXAM: VS:  BP 102/80   Pulse 91   Ht 5\' 2"  (1.575 m)   Wt 125 lb 3.2 oz (56.8 kg)   SpO2 97%   BMI 22.90 kg/m  , BMI  Body mass index is 22.9 kg/m. GEN: Well nourished, well developed, in no acute distress HEENT: normal Neck: no JVD, carotid bruits, or masses Cardiac: RRR; no murmurs, rubs, or gallops,no edema  Respiratory:  Clear to auscultation bilaterally, normal work of breathing GI: soft, nontender, nondistended, + BS MS: no deformity or atrophy Skin: warm and dry, no rash Neuro:  Strength and sensation are intact Psych: euthymic mood, full affect   EKG: Not completed this office visit.   Recent Labs: 08/02/2018: ALT 16 08/03/2018: BUN 5; Creatinine, Ser 0.63; Hemoglobin 11.7; Platelets 272; Potassium 3.8; Sodium 136    Lipid Panel    Component Value Date/Time   CHOL 145 08/02/2018 2354   TRIG 185 (H) 08/02/2018 2354   HDL 27 (L) 08/02/2018 2354   CHOLHDL 5.4 08/02/2018 2354   VLDL 37 08/02/2018 2354   LDLCALC 81 08/02/2018 2354      Wt Readings from Last 3 Encounters:  09/16/18 125 lb 3.2 oz (56.8 kg)  08/14/18 120 lb 6.4 oz (54.6 kg)  08/06/18 117 lb 8.1 oz (53.3 kg)      Other studies Reviewed: Cath: 08/02/18   There is mild left ventricular systolic dysfunction.  The left ventricular ejection fraction is 35-45% by visual estimate.  There is no aortic valve stenosis.  Mid RCA lesion is 20% stenosed.  Mid RCA to Dist RCA lesion is 100% stenosed.  A drug-eluting stent was successfully placed using a STENT SYNERGY DES 3X32. Post Dilated to 3.6 mm  Post intervention, there is a 0% residual stenosis.  Post Atrio lesion is 95% stenosed - distal embolization (too small & distal) -- > IV Aggrastat  Mid LM to Dist LM lesion is 35% stenosed.  Prox LAD-1 lesion is 20% stenosed. Prox LAD-2 lesion is 40% stenosed. Prox LAD-3 lesion is 20% stenosed.  CULPRIT LESION: 100% mid RCA thrombotic occlusion -> treated with Synergy DES stent (3.0 mm x 32 mm postdilated to 3.6 mm) Residual disease in the PDA of roughly 20% improved with nitroglycerin, also distal embolization of  thrombus into the distal main RPL branch with some resolution after IC nitroglycerin and Aggrastat. 30 to 40% left main disease with mild 20 to 40% mid LAD disease at takeoff of large diagonal branch. Mildly reduced LVEF of 45 to 50% with inferior hypokinesis.  Plan: Transfer to CCU for post cardiac cath care. Will run Aggrastat for 6 hours due to but distal embolization and large thrombus burden. Increase Crestor to 40 mg daily. When blood pressure tolerates, start low-dose beta-blocker and possible ACE inhibitor/ARB due to LV dysfunction. Check 2D echocardiogram prior to discharge.  Recommend uninterrupted dual antiplatelet therapy with Aspirin 81mg  daily and Ticagrelor 90mg  twice dailyfor a minimum of 12 months (ACS - Class I recommendation).  Bryan Lemma, M.D., M.S.  TTE: 08/04/18  Study Conclusions  - Left ventricle: The  cavity size was normal. Wall thickness was increased in a pattern of mild LVH. Systolic function was moderately reduced. The estimated ejection fraction was in the range of 35% to 40%. There is akinesis of the inferolateral and inferior myocardium. Features are consistent with a pseudonormal left ventricular filling pattern, with concomitant abnormal relaxation and increased filling pressure (grade 2 diastolic dysfunction). Doppler parameters are consistent with high ventricular filling pressure. - Mitral valve: There was mild to moderate regurgitation.  Impressions:  - Akinesis of the inferior and inferolateral walls with overall moderate LV dysfunction; moderate diastolic dysfunction; mild LVH; mild to moderate MR.   ASSESSMENT AND PLAN:  1. CAD: No complaints of chest pain. She continues to have fatigue. She is not on BB, ACE or ARB due to hypotension.   2. Systolic Dysfunction: She does not have evidence of volume overload on exam. She is not on diuretic. She continues to have fatigue.  Although it is early, she wants  to go back to work. I will recheck echo for improvement in LV fx. She will need to be on a BB as HR is not well controlled however, she does not tolerate due to hypotension. Not clear on how much improvement she will have in EF.   3. Ongoing tobacco abuse:  She is counseled again on smoking cessation. She is trying to quit. Lung function may also be playing into her dyspnea. I spoke with her about referral to pulmonology. She is willing to consider this. She is in [place], and will see Dr. Juanetta Gosling  Current medicines are reviewed at length with the patient today.    Labs/ tests ordered today include: Echocardiogram.   Bettey Mare. Liborio Nixon, ANP, AACC   09/16/2018 11:24 AM    [facility] [address] Office [phone] Fax [phone]

## 2018-09-16 ENCOUNTER — Ambulatory Visit (INDEPENDENT_AMBULATORY_CARE_PROVIDER_SITE_OTHER): Payer: BLUE CROSS/BLUE SHIELD | Admitting: Adult Health

## 2018-09-16 ENCOUNTER — Encounter: Payer: Self-pay | Admitting: Adult Health

## 2018-09-16 VITALS — BP 102/80 | HR 91 | Ht 62.0 in | Wt 125.2 lb

## 2018-09-16 DIAGNOSIS — I519 Heart disease, unspecified: Secondary | ICD-10-CM | POA: Diagnosis not present

## 2018-09-16 DIAGNOSIS — I5020 Unspecified systolic (congestive) heart failure: Secondary | ICD-10-CM

## 2018-09-16 DIAGNOSIS — Z720 Tobacco use: Secondary | ICD-10-CM

## 2018-09-16 DIAGNOSIS — I5189 Other ill-defined heart diseases: Secondary | ICD-10-CM

## 2018-09-16 MED ORDER — HYDROCORTISONE 2 % EX LOTN: 1.0000 | TOPICAL_LOTION | Freq: Three times a day (TID) | CUTANEOUS | 0 refills | Status: DC

## 2018-09-16 MED ORDER — CLOPIDOGREL BISULFATE 75 MG PO TABS: ORAL_TABLET | ORAL | 3 refills | Status: DC

## 2018-09-16 NOTE — Patient Instructions (Signed)
Medication Instructions:  START HYDROCORTISONE 2% THREE TIME DAILY TO AFFECTED AREA  If you need a refill on your cardiac medications before your next appointment, please call your pharmacy.  Labwork: If you have labs (blood work) drawn today and your tests are completely normal, you will receive your results only by: Marland Kitchen MyChart Message (if you have MyChart) OR . A paper copy in the mail If you have any lab test that is abnormal or we need to change your treatment, we will call you to review the results.  Testing/Procedures: Echocardiogram - Your physician has requested that you have an echocardiogram. Echocardiography is a painless test that uses sound waves to create images of your heart. It provides your doctor with information about the size and shape of your heart and how well your heart's chambers and valves are working. This procedure takes approximately one hour. There are no restrictions for this procedure. This will be performed at our [facility] location - [address], [address].   Follow-Up: You will need a follow up appointment in AFTER TESTING WITH  KATHRYN LAWRENCE DNP,AACC IF PRIMARY CARDIOLOGIST Purvis Sheffield) IS UNAVAILABLE.   You will need a follow up appointment in 6 MONTHS.  Please call our office 2 months in advance(MAR 2020) to schedule the (MAY 2020) appointment.  You may see  DR Purvis Sheffield  or one of the  Advanced Practice Providers on your designated Care Team Thank you for choosing [facility] at [facility]!!

## 2018-09-23 ENCOUNTER — Ambulatory Visit (HOSPITAL_COMMUNITY): Payer: BLUE CROSS/BLUE SHIELD | Attending: Cardiology

## 2018-09-23 ENCOUNTER — Other Ambulatory Visit: Payer: Self-pay

## 2018-09-23 DIAGNOSIS — I5020 Unspecified systolic (congestive) heart failure: Secondary | ICD-10-CM | POA: Diagnosis not present

## 2018-09-23 DIAGNOSIS — I519 Heart disease, unspecified: Secondary | ICD-10-CM

## 2018-09-23 MED ORDER — PERFLUTREN LIPID MICROSPHERE
1.0000 mL | INTRAVENOUS | Status: AC | PRN
  Administered 2018-09-23: 1 mL via INTRAVENOUS

## 2018-09-25 ENCOUNTER — Telehealth: Payer: Self-pay | Admitting: Adult Health

## 2018-09-25 NOTE — Telephone Encounter (Signed)
New Message:    Patient calling back she need a note for [employer] to be approve for 09/16/18 to 09/25/18 that she was out of work they drop her. Fax# [phone]. Please call if there are any questions. For [employer] antt: to Marland Mcalpine

## 2018-09-26 NOTE — Telephone Encounter (Signed)
ALREADY SENT YESTERDAY, S/W PT YESTERDAY ALSO SENT TO PT WORK. SENT TO BE SCANNED

## 2018-09-30 DIAGNOSIS — J449 Chronic obstructive pulmonary disease, unspecified: Secondary | ICD-10-CM | POA: Diagnosis not present

## 2018-09-30 DIAGNOSIS — E038 Other specified hypothyroidism: Secondary | ICD-10-CM | POA: Diagnosis not present

## 2018-09-30 DIAGNOSIS — I5023 Acute on chronic systolic (congestive) heart failure: Secondary | ICD-10-CM | POA: Diagnosis not present

## 2018-09-30 DIAGNOSIS — F418 Other specified anxiety disorders: Secondary | ICD-10-CM | POA: Diagnosis not present

## 2018-10-01 NOTE — Progress Notes (Addendum)
Cardiology Office Note   Date:  10/07/2018   ID:  Whitney May, DOB 1961-02-12, MRN [id number]  PCP:  Toma Deiters, MD  Cardiologist: Dr. Purvis Sheffield  Chief Complaint  Patient presents with  . Follow-up  . Coronary Artery Disease  . Congestive Heart Failure  . Claudication     History of Present Illness: Whitney May is a 57 y.o. female who presents for ongoing assessment and management of CAD, cath 07/2018 demonstrating 100% RCA occlusion treatment Synergy DES placement. She was placed on DAPT with Brilinta and ASA. She was not started on BB due to hypotension. EF per echo 35%-40% with akinesis of the inferolateral and inferior myocardium.   Other history includes ETOH abuse, tobacco abuse, and pericardial effusion.Lasr seen in the office on 08/14/2018 complaining of fatigue. She was changed from Brilinta to Plavix due to dyspnea and reloaded on 08/26/2018, tobacco abuse and hypercholesterolemia;   She comes today with complaints of fatigue since returning to work and pain in her left calf with walking.  She has has some swelling in the ankle.  She is back on 12 hour shifts, at [employer]. She does a lot of walking and minimal lifting, nothing over 10 lbs. She does stand for long periods of time. Sometimes standing on her tip toes to reach above her head.   She states that she has stopped smoking for one week, but is around second hand smoke. She denies chest pain, DOE. She has some generalized fatigue.   Past Medical History:  Diagnosis Date  . Alcohol use   . Chest pain, unspecified   . Hyperlipidemia, mixed   . Hypothyroidism   . Tobacco abuse     Past Surgical History:  Procedure Laterality Date  . CESAREAN SECTION    . CORONARY/GRAFT ACUTE MI REVASCULARIZATION N/A 08/02/2018   Procedure: Coronary/Graft Acute MI Revascularization;  Surgeon: Marykay Lex, MD;  Location: [facility] INVASIVE CV LAB;  Service: Cardiovascular;  Laterality: N/A;  . LEFT HEART CATH AND CORONARY  ANGIOGRAPHY N/A 08/02/2018   Procedure: LEFT HEART CATH AND CORONARY ANGIOGRAPHY;  Surgeon: Marykay Lex, MD;  Location: [facility] INVASIVE CV LAB;  Service: Cardiovascular;  Laterality: N/A;     Current Outpatient Medications  Medication Sig Dispense Refill  . aspirin 81 MG chewable tablet Chew 1 tablet (81 mg total) by mouth daily.    . CHANTIX 1 MG tablet     . clopidogrel (PLAVIX) 75 MG tablet Take 4 tablets (300mg ) by mouth on DAY 1 of medication then take 1 tablet tablet thereafter. 90 tablet 3  . HYDROCORTISONE, TOPICAL, 2 % LOTN Apply 1 Product topically 3 (three) times daily. TO AFFECTED AREA 2 Bottle 0  . hydroxypropyl methylcellulose / hypromellose (ISOPTO TEARS / GONIOVISC) 2.5 % ophthalmic solution Place 1 drop into both eyes as needed for dry eyes.    Marland Kitchen levothyroxine (SYNTHROID, LEVOTHROID) 150 MCG tablet Take 150 mcg by mouth daily.    Marland Kitchen LORazepam (ATIVAN) 0.5 MG tablet Take 0.5 mg by mouth at bedtime.  2  . Multiple Vitamin (MULTIVITAMIN WITH MINERALS) TABS tablet Take 1 tablet by mouth daily. 30 tablet 0  . nicotine (NICODERM CQ - DOSED IN MG/24 HOURS) 21 mg/24hr patch Place 1 patch (21 mg total) onto the skin daily. 28 patch 0  . nitroGLYCERIN (NITROSTAT) 0.4 MG SL tablet Place 1 tablet (0.4 mg total) under the tongue every 5 (five) minutes x 3 doses as needed for chest pain. 25 tablet 1  .  rosuvastatin (CRESTOR) 40 MG tablet Take 1 tablet (40 mg total) by mouth daily. 90 tablet 1   No current facility-administered medications for this visit.     Allergies:   Penicillins    Social History:  The patient  reports that she quit smoking about 8 weeks ago. Her smoking use included cigarettes. She has a 22.00 pack-year smoking history. She has never used smokeless tobacco. She reports that she drinks about 8.0 standard drinks of alcohol per week. She reports that she does not use drugs.   Family History:  The patient's family history includes Coronary artery disease in her maternal  grandmother and mother; Diabetes in her father; Hypertension in her father.    ROS: All other systems are reviewed and negative. Unless otherwise mentioned in H&P    PHYSICAL EXAM: VS:  BP 115/74   Pulse 86   Ht 5\' 2"  (1.575 m)   Wt 130 lb (59 kg)   BMI 23.78 kg/m  , BMI Body mass index is 23.78 kg/m. GEN: Well nourished, well developed, in no acute distress HEENT: normal Neck: no JVD, carotid bruits, or masses Cardiac: RRR; no murmurs, rubs, or gallops,no edema, unable to palpate pulse in the DP or PT, doppler pulse is noted in the left groin, bruit in popliteal and no auscultation DP or PT.  Respiratory:  Clear to auscultation bilaterally, normal work of breathing GI: soft, nontender, nondistended, + BS MS: no deformity or atrophy Skin: warm and dry, no rash Neuro:  Strength and sensation are intact Psych: euthymic mood, full affect   EKG:  Not completed this office visit.   Recent Labs: 08/02/2018: ALT 16 08/03/2018: BUN 5; Creatinine, Ser 0.63; Hemoglobin 11.7; Platelets 272; Potassium 3.8; Sodium 136    Lipid Panel    Component Value Date/Time   CHOL 145 08/02/2018 2354   TRIG 185 (H) 08/02/2018 2354   HDL 27 (L) 08/02/2018 2354   CHOLHDL 5.4 08/02/2018 2354   VLDL 37 08/02/2018 2354   LDLCALC 81 08/02/2018 2354      Wt Readings from Last 3 Encounters:  10/07/18 130 lb (59 kg)  09/16/18 125 lb 3.2 oz (56.8 kg)  08/14/18 120 lb 6.4 oz (54.6 kg)      Other studies Reviewed: 09/23/2018 Left ventricle: The cavity size was normal. Systolic function was   normal. The estimated ejection fraction was in the range of 50%   to 55%. Wall motion was normal; there were no regional wall   motion abnormalities. Doppler parameters are consistent with   abnormal left ventricular relaxation (grade 1 diastolic   dysfunction).  Impressions:  - Since the last study on 08/04/18 LVEF has improved from 35-40% to   45-50%. there is residual hypokinesis in the basal and  mid   inferior walls, wall motion abnormalities in the inferolateral   walls have improved.  Other studies Reviewed: Cath: 08/02/18   There is mild left ventricular systolic dysfunction.  The left ventricular ejection fraction is 35-45% by visual estimate.  There is no aortic valve stenosis.  Mid RCA lesion is 20% stenosed.  Mid RCA to Dist RCA lesion is 100% stenosed.  A drug-eluting stent was successfully placed using a STENT SYNERGY DES 3X32. Post Dilated to 3.6 mm  Post intervention, there is a 0% residual stenosis.  Post Atrio lesion is 95% stenosed - distal embolization (too small & distal) -- > IV Aggrastat  Mid LM to Dist LM lesion is 35% stenosed.  Prox LAD-1 lesion is  20% stenosed. Prox LAD-2 lesion is 40% stenosed. Prox LAD-3 lesion is 20% stenosed.  CULPRIT LESION: 100% mid RCA thrombotic occlusion -> treated with Synergy DES stent (3.0 mm x 32 mm postdilated to 3.6 mm) Residual disease in the PDA of roughly 20% improved with nitroglycerin, also distal embolization of thrombus into the distal main RPL branch with some resolution after IC nitroglycerin and Aggrastat. 30 to 40% left main disease with mild 20 to 40% mid LAD disease at takeoff of large diagonal branch. Mildly reduced LVEF of 45 to 50% with inferior hypokinesis.  Plan: Transfer to CCU for post cardiac cath care. Will run Aggrastat for 6 hours due to but distal embolization and large thrombus burden. Increase Crestor to 40 mg daily. When blood pressure tolerates, start low-dose beta-blocker and possible ACE inhibitor/ARB due to LV dysfunction. Check 2D echocardiogram prior to discharge.  Recommend uninterrupted dual antiplatelet therapy with Aspirin 81mg  daily and Ticagrelor 90mg  twice dailyfor a minimum of 12 months (ACS - Class I recommendation).  Bryan Lemma, M.D., M.S.  ASSESSMENT AND PLAN:  1.  Left Leg Claudication Symptoms: She does not have palpable pulses in the DP and PT with no  doppler pulses per portable doppler in the room. She has left groin doppler pulse that is strong, and popliteal is faint on palpation with bruit auscultated per portable doppler in the exam room. She will be scheduled today for ABI's with segmental doppler studies for more in depth evaluation. More recommendations post study.  She has been scheduled for today at 1 pm.    2.CAD: Hx of RCA disease with 100% RCA occlusion, requiring PCI, with reduced EF of 35%. This hs improved per last echo to 50-55%. She is still having fatigue at the end of her shifts. She just went back to work this week. 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 No chest pain. I will not plan ischemic testing at this time. She is to continue on current regimine. Continue Plavix. She did not tolerate Brilinta.   3. Tobacco abuse: She has stopped for one week, but admits that she may "take a puff or two." She is wearing Nicoderm patches and has been prescribed Chantix but has not taken it.  Due to dyspnea, she will be referred to [facility] for further recommendations.      Current medicines are reviewed at length with the patient today.    Labs/ tests ordered today include: ABI with Segmental doppler  studies.   Addendum: Per verbal report from vascular tech, Dannelle Rice, she has an occluded SFA on the left.  Study has not been officially read by vascular cardiologist yet. Due to abnormal test, she is scheduled with Dr. Kirke Corin at 10:30 am on 10/10/2018 for further evaluation and recommendations In the interim she is started on Pletal 50 mg BID.   Bettey Mare. Liborio Nixon, ANP, AACC   10/07/2018 11:07 AM    [facility] [address] Office [phone] Fax [phone]

## 2018-10-07 ENCOUNTER — Ambulatory Visit (INDEPENDENT_AMBULATORY_CARE_PROVIDER_SITE_OTHER): Payer: BLUE CROSS/BLUE SHIELD | Admitting: Adult Health

## 2018-10-07 ENCOUNTER — Ambulatory Visit (HOSPITAL_COMMUNITY)
Admission: RE | Admit: 2018-10-07 | Discharge: 2018-10-07 | Disposition: A | Discharge status: Home / Self Care | Payer: BLUE CROSS/BLUE SHIELD | Source: Ambulatory Visit | Attending: Cardiology | Admitting: Cardiology

## 2018-10-07 ENCOUNTER — Encounter: Payer: Self-pay | Admitting: Adult Health

## 2018-10-07 VITALS — BP 115/74 | HR 86 | Ht 62.0 in | Wt 130.0 lb

## 2018-10-07 DIAGNOSIS — Z720 Tobacco use: Secondary | ICD-10-CM | POA: Diagnosis not present

## 2018-10-07 DIAGNOSIS — R0989 Other specified symptoms and signs involving the circulatory and respiratory systems: Secondary | ICD-10-CM

## 2018-10-07 DIAGNOSIS — R0602 Shortness of breath: Secondary | ICD-10-CM | POA: Diagnosis not present

## 2018-10-07 DIAGNOSIS — I739 Peripheral vascular disease, unspecified: Secondary | ICD-10-CM

## 2018-10-07 DIAGNOSIS — I2510 Atherosclerotic heart disease of native coronary artery without angina pectoris: Secondary | ICD-10-CM

## 2018-10-07 MED ORDER — CILOSTAZOL 50 MG PO TABS: 50.0000 mg | ORAL_TABLET | Freq: Two times a day (BID) | ORAL | 6 refills | Status: DC

## 2018-10-07 NOTE — Patient Instructions (Signed)
Medication Instructions:  NO CHANGES- Your physician recommends that you continue on your current medications as directed. Please refer to the Current Medication list given to you today. If you need a refill on your cardiac medications before your next appointment, please call your pharmacy.  Labwork:If you have labs (blood work) drawn today and your tests are completely normal, you will receive your results only by: Marland Kitchen MyChart Message (if you have MyChart) OR . A paper copy in the mail If you have any lab test that is abnormal or we need to change your treatment, we will call you to review the results.  Testing/Procedures: Your physician has requested that you have an ankle brachial index (ABI). During this test an ultrasound and blood pressure cuff are used to evaluate the arteries that supply the arms and legs with blood. Allow thirty minutes for this exam. There are no restrictions or special instructions.  Special Instructions: REFERRAL TO PULMONARY  Follow-Up: You will need a follow up appointment in AFTER SCAN.  You may see  DR Inis Sizer, DNP, AACC or one of the following Advanced Practice Providers on your designated Care Team. At [facility], you and your health needs are our priority.  As part of our continuing mission to provide you with exceptional heart care, we have created designated Provider Care Teams.  These Care Teams include your primary Cardiologist (physician) and Advanced Practice Providers (APPs -  Physician Assistants and Nurse Practitioners) who all work together to provide you with the care you need, when you need it.  Thank you for choosing [facility] at [facility]!!

## 2018-10-07 NOTE — Addendum Note (Signed)
Addended by: Alyson Ingles on: 10/07/2018 04:11 PM   Modules accepted: Orders

## 2018-10-08 ENCOUNTER — Ambulatory Visit (INDEPENDENT_AMBULATORY_CARE_PROVIDER_SITE_OTHER): Payer: BLUE CROSS/BLUE SHIELD | Admitting: Cardiovascular Disease

## 2018-10-08 ENCOUNTER — Encounter: Payer: Self-pay | Admitting: Cardiovascular Disease

## 2018-10-08 VITALS — BP 115/74 | HR 80 | Ht 62.0 in | Wt 126.2 lb

## 2018-10-08 DIAGNOSIS — I739 Peripheral vascular disease, unspecified: Secondary | ICD-10-CM

## 2018-10-08 DIAGNOSIS — Z720 Tobacco use: Secondary | ICD-10-CM | POA: Diagnosis not present

## 2018-10-08 DIAGNOSIS — I2510 Atherosclerotic heart disease of native coronary artery without angina pectoris: Secondary | ICD-10-CM | POA: Diagnosis not present

## 2018-10-08 DIAGNOSIS — E785 Hyperlipidemia, unspecified: Secondary | ICD-10-CM | POA: Diagnosis not present

## 2018-10-08 NOTE — Patient Instructions (Signed)
Medication Instructions:  Your physician has recommended you make the following change in your medication:  1) STOP Pletal  If you need a refill on your cardiac medications before your next appointment, please call your pharmacy.   Lab work: none If you have labs (blood work) drawn today and your tests are completely normal, you will receive your results only by: Marland Kitchen MyChart Message (if you have MyChart) OR . A paper copy in the mail If you have any lab test that is abnormal or we need to change your treatment, we will call you to review the results.  Testing/Procedures: none  Follow-Up: At [facility], you and your health needs are our priority.  As part of our continuing mission to provide you with exceptional heart care, we have created designated Provider Care Teams.  These Care Teams include your primary Cardiologist (physician) and Advanced Practice Providers (APPs -  Physician Assistants and Nurse Practitioners) who all work together to provide you with the care you need, when you need it. You will need a follow up appointment in 6 months.  Please call our office 2 months in advance to schedule this appointment.  You may see Dr. Kirke Corin - PV or one of the following Advanced Practice Providers on your designated Care Team:   Corine Shelter, PA-C Judy Pimple, [place] . Marjie Skiff, PA-C  Any Other Special Instructions Will Be Listed Below (If Applicable).

## 2018-10-08 NOTE — Progress Notes (Signed)
Cardiology Office Note   Date:  10/08/2018   ID:  Whitney May, DOB 08/07/1961, MRN [id number]  PCP:  Toma Deiters, MD  Cardiologist:  Dr. Purvis Sheffield  Chief Complaint  Patient presents with  . New Patient (Initial Visit)    positive doppler      History of Present Illness: Whitney May is a 57 y.o. female who was referred by Joni Reining for evaluation and management of peripheral arterial disease. She has known history of coronary artery disease with myocardial infarction in September.  Catheterization showed occluded RCA which was treated with drug-eluting stent placement.  Ejection fraction was 35 to 40% with inferior and inferolateral akinesis.  She has chronic medical conditions that include hyperlipidemia, tobacco and excessive alcohol use. She complained recently of left calf claudication and thus she was referred for vascular studies which showed moderately reduced ABI on the left with evidence of occluded SFA from its origin with reconstitution distally via collaterals from the profunda. She reports having left calf claudication for more than a year which has been stable overall.  She has no rest pain or lower extremity ulceration.  The pain happens after walking less than a block but usually is not severe enough to force her to stop.  She quit smoking recently and also quit drinking alcohol. She was prescribed Pletal but she is having significant dizziness with it.   Past Medical History:  Diagnosis Date  . Alcohol use   . Chest pain, unspecified   . Hyperlipidemia, mixed   . Hypothyroidism   . Tobacco abuse     Past Surgical History:  Procedure Laterality Date  . CESAREAN SECTION    . CORONARY/GRAFT ACUTE MI REVASCULARIZATION N/A 08/02/2018   Procedure: Coronary/Graft Acute MI Revascularization;  Surgeon: Marykay Lex, MD;  Location: [facility] INVASIVE CV LAB;  Service: Cardiovascular;  Laterality: N/A;  . LEFT HEART CATH AND CORONARY ANGIOGRAPHY N/A  08/02/2018   Procedure: LEFT HEART CATH AND CORONARY ANGIOGRAPHY;  Surgeon: Marykay Lex, MD;  Location: [facility] INVASIVE CV LAB;  Service: Cardiovascular;  Laterality: N/A;     Current Outpatient Medications  Medication Sig Dispense Refill  . aspirin 81 MG chewable tablet Chew 1 tablet (81 mg total) by mouth daily.    . CHANTIX 1 MG tablet     . clopidogrel (PLAVIX) 75 MG tablet Take 4 tablets (300mg ) by mouth on DAY 1 of medication then take 1 tablet tablet thereafter. 90 tablet 3  . HYDROCORTISONE, TOPICAL, 2 % LOTN Apply 1 Product topically 3 (three) times daily. TO AFFECTED AREA 2 Bottle 0  . hydroxypropyl methylcellulose / hypromellose (ISOPTO TEARS / GONIOVISC) 2.5 % ophthalmic solution Place 1 drop into both eyes as needed for dry eyes.    Marland Kitchen levothyroxine (SYNTHROID, LEVOTHROID) 150 MCG tablet Take 150 mcg by mouth daily.    Marland Kitchen LORazepam (ATIVAN) 0.5 MG tablet Take 0.5 mg by mouth at bedtime.  2  . Multiple Vitamin (MULTIVITAMIN WITH MINERALS) TABS tablet Take 1 tablet by mouth daily. 30 tablet 0  . nicotine (NICODERM CQ - DOSED IN MG/24 HOURS) 21 mg/24hr patch Place 1 patch (21 mg total) onto the skin daily. 28 patch 0  . nitroGLYCERIN (NITROSTAT) 0.4 MG SL tablet Place 1 tablet (0.4 mg total) under the tongue every 5 (five) minutes x 3 doses as needed for chest pain. 25 tablet 1  . rosuvastatin (CRESTOR) 40 MG tablet Take 1 tablet (40 mg total) by mouth daily. 90  tablet 1   No current facility-administered medications for this visit.     Allergies:   Penicillins    Social History:  The patient  reports that she quit smoking about 1 months ago. Her smoking use included cigarettes. She has a 22.00 pack-year smoking history. She has never used smokeless tobacco. She reports that she drinks about 8.0 standard drinks of alcohol per week. She reports that she does not use drugs.   Family History:  The patient's family history includes Coronary artery disease in her maternal grandmother  and mother; Diabetes in her father; Hypertension in her father.    ROS:  Please see the history of present illness.   Otherwise, review of systems are positive for none.   All other systems are reviewed and negative.    PHYSICAL EXAM: VS:  BP 115/74   Pulse 80   Ht 5\' 2"  (1.575 m)   Wt 126 lb 3.2 oz (57.2 kg)   BMI 23.08 kg/m  , BMI Body mass index is 23.08 kg/m. GEN: Well nourished, well developed, in no acute distress  HEENT: normal  Neck: no JVD, carotid bruits, or masses Cardiac: RRR; no murmurs, rubs, or gallops,no edema  Respiratory:  clear to auscultation bilaterally, normal work of breathing GI: soft, nontender, nondistended, + BS MS: no deformity or atrophy  Skin: warm and dry, no rash Neuro:  Strength and sensation are intact Psych: euthymic mood, full affect Vascular: Femoral pulses normal bilaterally.  Distal pulses are palpable on the right side but not the left side.  EKG:  EKG is not ordered today.   Recent Labs: 08/02/2018: ALT 16 08/03/2018: BUN 5; Creatinine, Ser 0.63; Hemoglobin 11.7; Platelets 272; Potassium 3.8; Sodium 136    Lipid Panel    Component Value Date/Time   CHOL 145 08/02/2018 2354   TRIG 185 (H) 08/02/2018 2354   HDL 27 (L) 08/02/2018 2354   CHOLHDL 5.4 08/02/2018 2354   VLDL 37 08/02/2018 2354   LDLCALC 81 08/02/2018 2354      Wt Readings from Last 3 Encounters:  10/08/18 126 lb 3.2 oz (57.2 kg)  10/07/18 130 lb (59 kg)  09/16/18 125 lb 3.2 oz (56.8 kg)       PAD Screen 10/08/2018  Previous PAD dx? No  Previous surgical procedure? No  Pain with walking? Yes  Subsides with rest? Yes  Feet/toe relief with dangling? No  Painful, non-healing ulcers? No  Extremities discolored? No      ASSESSMENT AND PLAN:  1.  Peripheral arterial disease: Moderate left calf claudication due to long occlusion of the left SFA with moderately reduced ABI.  I discussed with her the natural history and management of claudication.   We also  discussed management options including a walking program versus revascularization. I encouraged her to start a walking program and if symptoms do not improve, angiography with possible revascularization can be considered.  Nonetheless, I explained to her that with long occlusion like this, endovascular intervention is less successful and also long-term patency is lower.  Femoral-popliteal bypass can be an option if her symptoms are more severe. She started taking cilostazol but she is having significant dizziness from this and thus I discontinued the medication.  She is already on dual antiplatelet therapy and this might increase her bleeding risk.  In addition, she has ischemic cardiomyopathy.  2.  Coronary artery disease involving native coronary arteries: She is doing well with no anginal symptoms.  Continue medical therapy.  3.  Tobacco  use: She quit smoking recently.  4.  Hyperlipidemia: I agree with high-dose rosuvastatin with a target LDL of less than 70.    Disposition:   FU with me in 6 months  Signed,  Lorine Bears, MD  10/08/2018 9:30 AM    [facility]

## 2018-10-18 DIAGNOSIS — E039 Hypothyroidism, unspecified: Secondary | ICD-10-CM | POA: Diagnosis not present

## 2018-10-18 DIAGNOSIS — I252 Old myocardial infarction: Secondary | ICD-10-CM | POA: Diagnosis not present

## 2018-10-18 DIAGNOSIS — F17210 Nicotine dependence, cigarettes, uncomplicated: Secondary | ICD-10-CM | POA: Diagnosis not present

## 2018-10-21 ENCOUNTER — Institutional Professional Consult (permissible substitution): Payer: BLUE CROSS/BLUE SHIELD | Admitting: Pulmonary Disease

## 2018-10-21 DIAGNOSIS — R911 Solitary pulmonary nodule: Secondary | ICD-10-CM | POA: Insufficient documentation

## 2018-10-21 DIAGNOSIS — R0602 Shortness of breath: Secondary | ICD-10-CM | POA: Insufficient documentation

## 2018-10-21 NOTE — Progress Notes (Deleted)
Synopsis: Referred in *** for *** by Toma Deiters, MD  Subjective:   PATIENT ID: Whitney May GENDER: female DOB: 01-30-1961, MRN: [id number]  No chief complaint on file.   HPI  ***  Past Medical History:  Diagnosis Date  . Alcohol use   . Chest pain, unspecified   . Hyperlipidemia, mixed   . Hypothyroidism   . Tobacco abuse      Family History  Problem Relation Age of Onset  . Coronary artery disease Mother   . Coronary artery disease Maternal Grandmother   . Hypertension Father   . Diabetes Father      Past Surgical History:  Procedure Laterality Date  . CESAREAN SECTION    . CORONARY/GRAFT ACUTE MI REVASCULARIZATION N/A 08/02/2018   Procedure: Coronary/Graft Acute MI Revascularization;  Surgeon: Marykay Lex, MD;  Location: [facility] INVASIVE CV LAB;  Service: Cardiovascular;  Laterality: N/A;  . LEFT HEART CATH AND CORONARY ANGIOGRAPHY N/A 08/02/2018   Procedure: LEFT HEART CATH AND CORONARY ANGIOGRAPHY;  Surgeon: Marykay Lex, MD;  Location: [facility] INVASIVE CV LAB;  Service: Cardiovascular;  Laterality: N/A;    Social History   Socioeconomic History  . Marital status: Married    Spouse name: Not on file  . Number of children: Not on file  . Years of education: Not on file  . Highest education level: Not on file  Occupational History  . Not on file  Social Needs  . Financial resource strain: Not on file  . Food insecurity:    Worry: Not on file    Inability: Not on file  . Transportation needs:    Medical: Not on file    Non-medical: Not on file  Tobacco Use  . Smoking status: Former Smoker    Packs/day: 1.00    Years: 22.00    Pack years: 22.00    Types: Cigarettes    Last attempt to quit: 08/09/2018    Years since quitting: 0.2  . Smokeless tobacco: Never Used  Substance and Sexual Activity  . Alcohol use: Yes    Alcohol/week: 8.0 standard drinks    Types: 8 Glasses of wine per week  . Drug use: No  . Sexual activity: Not on file    Lifestyle  . Physical activity:    Days per week: Not on file    Minutes per session: Not on file  . Stress: Not on file  Relationships  . Social connections:    Talks on phone: Not on file    Gets together: Not on file    Attends religious service: Not on file    Active member of club or organization: Not on file    Attends meetings of clubs or organizations: Not on file    Relationship status: Not on file  . Intimate partner violence:    Fear of current or ex partner: Not on file    Emotionally abused: Not on file    Physically abused: Not on file    Forced sexual activity: Not on file  Other Topics Concern  . Not on file  Social History Narrative   Married   Gets regular exercise     Allergies  Allergen Reactions  . Penicillins Other (See Comments)    Fever, vomiting Has patient had a PCN reaction causing immediate rash, facial/tongue/throat swelling, SOB or lightheadedness with hypotension: NO Has patient had a PCN reaction causing severe rash involving mucus membranes or skin necrosis: NO Has patient had a  PCN reaction that required hospitalization: NO Has patient had a PCN reaction occurring within the last 10 years: NO If all of the above answers are "NO", then may proceed Childhood allergy     Outpatient Medications Prior to Visit  Medication Sig Dispense Refill  . aspirin 81 MG chewable tablet Chew 1 tablet (81 mg total) by mouth daily.    . CHANTIX 1 MG tablet     . clopidogrel (PLAVIX) 75 MG tablet Take 4 tablets (300mg ) by mouth on DAY 1 of medication then take 1 tablet tablet thereafter. 90 tablet 3  . HYDROCORTISONE, TOPICAL, 2 % LOTN Apply 1 Product topically 3 (three) times daily. TO AFFECTED AREA 2 Bottle 0  . hydroxypropyl methylcellulose / hypromellose (ISOPTO TEARS / GONIOVISC) 2.5 % ophthalmic solution Place 1 drop into both eyes as needed for dry eyes.    Marland Kitchen levothyroxine (SYNTHROID, LEVOTHROID) 150 MCG tablet Take 150 mcg by mouth daily.    Marland Kitchen  LORazepam (ATIVAN) 0.5 MG tablet Take 0.5 mg by mouth at bedtime.  2  . Multiple Vitamin (MULTIVITAMIN WITH MINERALS) TABS tablet Take 1 tablet by mouth daily. 30 tablet 0  . nicotine (NICODERM CQ - DOSED IN MG/24 HOURS) 21 mg/24hr patch Place 1 patch (21 mg total) onto the skin daily. 28 patch 0  . nitroGLYCERIN (NITROSTAT) 0.4 MG SL tablet Place 1 tablet (0.4 mg total) under the tongue every 5 (five) minutes x 3 doses as needed for chest pain. 25 tablet 1  . rosuvastatin (CRESTOR) 40 MG tablet Take 1 tablet (40 mg total) by mouth daily. 90 tablet 1   No facility-administered medications prior to visit.     ROS   Objective:  Physical Exam   There were no vitals filed for this visit.   on *** LPM *** RA BMI Readings from Last 3 Encounters:  10/08/18 23.08 kg/m  10/07/18 23.78 kg/m  09/16/18 22.90 kg/m   Wt Readings from Last 3 Encounters:  10/08/18 126 lb 3.2 oz (57.2 kg)  10/07/18 130 lb (59 kg)  09/16/18 125 lb 3.2 oz (56.8 kg)     CBC    Component Value Date/Time   WBC 13.5 (H) 08/03/2018 0611   RBC 4.12 08/03/2018 0611   HGB 11.7 (L) 08/03/2018 0611   HCT 36.7 08/03/2018 0611   PLT 272 08/03/2018 0611   MCV 89.1 08/03/2018 0611   MCH 28.4 08/03/2018 0611   MCHC 31.9 08/03/2018 0611   RDW 14.6 08/03/2018 0611   LYMPHSABS 1.8 09/24/2015 1104   MONOABS 2.0 (H) 09/24/2015 1104   EOSABS 0.1 09/24/2015 1104   BASOSABS 0.0 09/24/2015 1104    ***  Chest Imaging: CTA Chest 09/2015 - neg for PE, 4mm LUL lung nodule.   Pulmonary Functions Testing Results: No flowsheet data found.  FeNO: ***  Pathology: ***  Echocardiogram: ***  Heart Catheterization: ***    Assessment & Plan:   SOB (shortness of breath)  Nodule of upper lobe of left lung - November 2016 - 4mm  Discussion: ***   Current Outpatient Medications:  .  aspirin 81 MG chewable tablet, Chew 1 tablet (81 mg total) by mouth daily., Disp: , Rfl:  .  CHANTIX 1 MG tablet, , Disp: , Rfl:  .   clopidogrel (PLAVIX) 75 MG tablet, Take 4 tablets (300mg ) by mouth on DAY 1 of medication then take 1 tablet tablet thereafter., Disp: 90 tablet, Rfl: 3 .  HYDROCORTISONE, TOPICAL, 2 % LOTN, Apply 1 Product topically 3 (  three) times daily. TO AFFECTED AREA, Disp: 2 Bottle, Rfl: 0 .  hydroxypropyl methylcellulose / hypromellose (ISOPTO TEARS / GONIOVISC) 2.5 % ophthalmic solution, Place 1 drop into both eyes as needed for dry eyes., Disp: , Rfl:  .  levothyroxine (SYNTHROID, LEVOTHROID) 150 MCG tablet, Take 150 mcg by mouth daily., Disp: , Rfl:  .  LORazepam (ATIVAN) 0.5 MG tablet, Take 0.5 mg by mouth at bedtime., Disp: , Rfl: 2 .  Multiple Vitamin (MULTIVITAMIN WITH MINERALS) TABS tablet, Take 1 tablet by mouth daily., Disp: 30 tablet, Rfl: 0 .  nicotine (NICODERM CQ - DOSED IN MG/24 HOURS) 21 mg/24hr patch, Place 1 patch (21 mg total) onto the skin daily., Disp: 28 patch, Rfl: 0 .  nitroGLYCERIN (NITROSTAT) 0.4 MG SL tablet, Place 1 tablet (0.4 mg total) under the tongue every 5 (five) minutes x 3 doses as needed for chest pain., Disp: 25 tablet, Rfl: 1 .  rosuvastatin (CRESTOR) 40 MG tablet, Take 1 tablet (40 mg total) by mouth daily., Disp: 90 tablet, Rfl: 1   Josephine Igo, DO [facility] 10/21/2018 8:54 AM

## 2018-11-15 DIAGNOSIS — Z716 Tobacco abuse counseling: Secondary | ICD-10-CM | POA: Diagnosis not present

## 2018-11-15 DIAGNOSIS — F17210 Nicotine dependence, cigarettes, uncomplicated: Secondary | ICD-10-CM | POA: Diagnosis not present

## 2018-11-26 ENCOUNTER — Institutional Professional Consult (permissible substitution): Payer: BLUE CROSS/BLUE SHIELD | Admitting: Pulmonary Disease

## 2018-11-29 DIAGNOSIS — Z716 Tobacco abuse counseling: Secondary | ICD-10-CM | POA: Diagnosis not present

## 2018-11-29 DIAGNOSIS — F17210 Nicotine dependence, cigarettes, uncomplicated: Secondary | ICD-10-CM | POA: Diagnosis not present

## 2018-11-29 DIAGNOSIS — I959 Hypotension, unspecified: Secondary | ICD-10-CM | POA: Diagnosis not present

## 2018-12-02 ENCOUNTER — Other Ambulatory Visit: Payer: Self-pay | Admitting: Adult Health

## 2018-12-02 MED ORDER — CLOPIDOGREL BISULFATE 75 MG PO TABS: ORAL_TABLET | ORAL | 3 refills | Status: DC

## 2018-12-02 NOTE — Telephone Encounter (Signed)
New Message    *STAT* If patient is at the pharmacy, call can be transferred to refill team.   1. Which medications need to be refilled? (please list name of each medication and dose if known)   clopidogrel (PLAVIX) 75 MG tablet     2. Which pharmacy/location (including street and city if local pharmacy) is medication to be sent to? [facility] [address] [place] - [address]  3. Do they need a 30 day or 90 day supply? 90    Patient is needing rx sent to her local pharmacy

## 2018-12-04 ENCOUNTER — Other Ambulatory Visit: Payer: Self-pay | Admitting: *Deleted

## 2018-12-13 DIAGNOSIS — Z716 Tobacco abuse counseling: Secondary | ICD-10-CM | POA: Diagnosis not present

## 2018-12-13 DIAGNOSIS — I959 Hypotension, unspecified: Secondary | ICD-10-CM | POA: Diagnosis not present

## 2018-12-13 DIAGNOSIS — F17210 Nicotine dependence, cigarettes, uncomplicated: Secondary | ICD-10-CM | POA: Diagnosis not present

## 2018-12-30 ENCOUNTER — Other Ambulatory Visit: Payer: Self-pay | Admitting: Adult Health

## 2019-01-24 DIAGNOSIS — Z716 Tobacco abuse counseling: Secondary | ICD-10-CM | POA: Diagnosis not present

## 2019-01-24 DIAGNOSIS — F17210 Nicotine dependence, cigarettes, uncomplicated: Secondary | ICD-10-CM | POA: Diagnosis not present

## 2019-03-07 ENCOUNTER — Observation Stay (HOSPITAL_COMMUNITY)
Admission: AD | Admit: 2019-03-07 | Discharge: 2019-03-08 | Disposition: A | Discharge status: Home / Self Care | Payer: BLUE CROSS/BLUE SHIELD | Source: Other Acute Inpatient Hospital | Attending: Cardiology | Admitting: Cardiology

## 2019-03-07 ENCOUNTER — Encounter (HOSPITAL_COMMUNITY)
Admission: AD | Disposition: A | Discharge status: Home / Self Care | Payer: Self-pay | Source: Other Acute Inpatient Hospital | Attending: Cardiology

## 2019-03-07 DIAGNOSIS — E782 Mixed hyperlipidemia: Secondary | ICD-10-CM | POA: Diagnosis not present

## 2019-03-07 DIAGNOSIS — I25110 Atherosclerotic heart disease of native coronary artery with unstable angina pectoris: Secondary | ICD-10-CM | POA: Diagnosis not present

## 2019-03-07 DIAGNOSIS — R079 Chest pain, unspecified: Secondary | ICD-10-CM | POA: Diagnosis not present

## 2019-03-07 DIAGNOSIS — Z7902 Long term (current) use of antithrombotics/antiplatelets: Secondary | ICD-10-CM | POA: Diagnosis not present

## 2019-03-07 DIAGNOSIS — T82855A Stenosis of coronary artery stent, initial encounter: Principal | ICD-10-CM | POA: Insufficient documentation

## 2019-03-07 DIAGNOSIS — I10 Essential (primary) hypertension: Secondary | ICD-10-CM | POA: Diagnosis not present

## 2019-03-07 DIAGNOSIS — I255 Ischemic cardiomyopathy: Secondary | ICD-10-CM

## 2019-03-07 DIAGNOSIS — Z7982 Long term (current) use of aspirin: Secondary | ICD-10-CM | POA: Insufficient documentation

## 2019-03-07 DIAGNOSIS — Z79899 Other long term (current) drug therapy: Secondary | ICD-10-CM | POA: Insufficient documentation

## 2019-03-07 DIAGNOSIS — I447 Left bundle-branch block, unspecified: Secondary | ICD-10-CM | POA: Diagnosis not present

## 2019-03-07 DIAGNOSIS — Z880 Allergy status to penicillin: Secondary | ICD-10-CM | POA: Diagnosis not present

## 2019-03-07 DIAGNOSIS — Z20828 Contact with and (suspected) exposure to other viral communicable diseases: Secondary | ICD-10-CM | POA: Insufficient documentation

## 2019-03-07 DIAGNOSIS — I252 Old myocardial infarction: Secondary | ICD-10-CM | POA: Diagnosis not present

## 2019-03-07 DIAGNOSIS — Z8249 Family history of ischemic heart disease and other diseases of the circulatory system: Secondary | ICD-10-CM | POA: Insufficient documentation

## 2019-03-07 DIAGNOSIS — Z955 Presence of coronary angioplasty implant and graft: Secondary | ICD-10-CM | POA: Diagnosis not present

## 2019-03-07 DIAGNOSIS — Z7289 Other problems related to lifestyle: Secondary | ICD-10-CM

## 2019-03-07 DIAGNOSIS — I2510 Atherosclerotic heart disease of native coronary artery without angina pectoris: Secondary | ICD-10-CM | POA: Diagnosis not present

## 2019-03-07 DIAGNOSIS — Z789 Other specified health status: Secondary | ICD-10-CM

## 2019-03-07 DIAGNOSIS — I214 Non-ST elevation (NSTEMI) myocardial infarction: Secondary | ICD-10-CM | POA: Diagnosis not present

## 2019-03-07 DIAGNOSIS — F17210 Nicotine dependence, cigarettes, uncomplicated: Secondary | ICD-10-CM | POA: Insufficient documentation

## 2019-03-07 DIAGNOSIS — E7800 Pure hypercholesterolemia, unspecified: Secondary | ICD-10-CM | POA: Diagnosis not present

## 2019-03-07 DIAGNOSIS — Z720 Tobacco use: Secondary | ICD-10-CM | POA: Diagnosis present

## 2019-03-07 DIAGNOSIS — Y831 Surgical operation with implant of artificial internal device as the cause of abnormal reaction of the patient, or of later complication, without mention of misadventure at the time of the procedure: Secondary | ICD-10-CM | POA: Insufficient documentation

## 2019-03-07 DIAGNOSIS — F17200 Nicotine dependence, unspecified, uncomplicated: Secondary | ICD-10-CM | POA: Diagnosis not present

## 2019-03-07 DIAGNOSIS — I739 Peripheral vascular disease, unspecified: Secondary | ICD-10-CM | POA: Diagnosis not present

## 2019-03-07 DIAGNOSIS — E039 Hypothyroidism, unspecified: Secondary | ICD-10-CM | POA: Diagnosis not present

## 2019-03-07 DIAGNOSIS — Z79890 Hormone replacement therapy (postmenopausal): Secondary | ICD-10-CM | POA: Insufficient documentation

## 2019-03-07 DIAGNOSIS — J449 Chronic obstructive pulmonary disease, unspecified: Secondary | ICD-10-CM | POA: Diagnosis not present

## 2019-03-07 DIAGNOSIS — F1090 Alcohol use, unspecified, uncomplicated: Secondary | ICD-10-CM

## 2019-03-07 HISTORY — PX: CORONARY STENT INTERVENTION: CATH118234

## 2019-03-07 HISTORY — PX: LEFT HEART CATH AND CORONARY ANGIOGRAPHY: CATH118249

## 2019-03-07 LAB — TROPONIN I
Troponin I: 1.02 ng/mL (ref <0.03)
Troponin I: 1.71 ng/mL (ref <0.03)

## 2019-03-07 LAB — POCT ACTIVATED CLOTTING TIME: Activated Clotting Time: 246 seconds

## 2019-03-07 SURGERY — LEFT HEART CATH AND CORONARY ANGIOGRAPHY
Anesthesia: LOCAL

## 2019-03-07 MED ORDER — CLOPIDOGREL BISULFATE 300 MG PO TABS
ORAL_TABLET | ORAL | Status: DC | PRN
  Administered 2019-03-07: 300 mg via ORAL

## 2019-03-07 MED ORDER — LABETALOL HCL 5 MG/ML IV SOLN: 10.0000 mg | INTRAVENOUS | Status: AC | PRN

## 2019-03-07 MED ORDER — SODIUM CHLORIDE 0.9% FLUSH
3.0000 mL | Freq: Two times a day (BID) | INTRAVENOUS | Status: DC
  Administered 2019-03-07: 19:00:00 3 mL via INTRAVENOUS

## 2019-03-07 MED ORDER — SODIUM CHLORIDE 0.9 % IV SOLN: 250.0000 mL | INTRAVENOUS | Status: DC | PRN

## 2019-03-07 MED ORDER — IOHEXOL 350 MG/ML SOLN
INTRAVENOUS | Status: DC | PRN
  Administered 2019-03-07: 50 mL via INTRA_ARTERIAL

## 2019-03-07 MED ORDER — HYDRALAZINE HCL 20 MG/ML IJ SOLN: 10.0000 mg | INTRAMUSCULAR | Status: AC | PRN

## 2019-03-07 MED ORDER — HEPARIN SODIUM (PORCINE) 1000 UNIT/ML IJ SOLN
INTRAMUSCULAR | Status: AC
  Filled 2019-03-07: qty 1

## 2019-03-07 MED ORDER — ACETAMINOPHEN 325 MG PO TABS: 650.0000 mg | ORAL_TABLET | ORAL | Status: DC | PRN

## 2019-03-07 MED ORDER — ROSUVASTATIN CALCIUM 20 MG PO TABS
40.0000 mg | ORAL_TABLET | Freq: Every day | ORAL | Status: DC
  Administered 2019-03-08: 40 mg via ORAL
  Filled 2019-03-07: qty 2

## 2019-03-07 MED ORDER — FENTANYL CITRATE (PF) 100 MCG/2ML IJ SOLN
INTRAMUSCULAR | Status: AC
  Filled 2019-03-07: qty 2

## 2019-03-07 MED ORDER — FENTANYL CITRATE (PF) 100 MCG/2ML IJ SOLN
INTRAMUSCULAR | Status: DC | PRN
  Administered 2019-03-07: 25 ug via INTRAVENOUS

## 2019-03-07 MED ORDER — VERAPAMIL HCL 2.5 MG/ML IV SOLN
INTRAVENOUS | Status: AC
  Filled 2019-03-07: qty 2

## 2019-03-07 MED ORDER — SODIUM CHLORIDE 0.9% FLUSH: 3.0000 mL | INTRAVENOUS | Status: DC | PRN

## 2019-03-07 MED ORDER — LIDOCAINE HCL (PF) 1 % IJ SOLN
INTRAMUSCULAR | Status: DC | PRN
  Administered 2019-03-07: 2 mL

## 2019-03-07 MED ORDER — CLOPIDOGREL BISULFATE 75 MG PO TABS: 75.0000 mg | ORAL_TABLET | Freq: Every day | ORAL | Status: DC

## 2019-03-07 MED ORDER — LORAZEPAM 0.5 MG PO TABS: 0.5000 mg | ORAL_TABLET | Freq: Every day | ORAL | Status: DC

## 2019-03-07 MED ORDER — HEPARIN SODIUM (PORCINE) 1000 UNIT/ML IJ SOLN
INTRAMUSCULAR | Status: DC | PRN
  Administered 2019-03-07: 3000 [IU] via INTRAVENOUS

## 2019-03-07 MED ORDER — HEPARIN SODIUM (PORCINE) 1000 UNIT/ML IJ SOLN
INTRAMUSCULAR | Status: DC | PRN
  Administered 2019-03-07: 3000 [IU] via INTRAVENOUS
  Administered 2019-03-07: 2000 [IU] via INTRAVENOUS

## 2019-03-07 MED ORDER — ATORVASTATIN CALCIUM 80 MG PO TABS: 80.0000 mg | ORAL_TABLET | Freq: Every day | ORAL | Status: DC

## 2019-03-07 MED ORDER — IOHEXOL 350 MG/ML SOLN
INTRAVENOUS | Status: DC | PRN
  Administered 2019-03-07: 60 mL via INTRA_ARTERIAL

## 2019-03-07 MED ORDER — ONDANSETRON HCL 4 MG/2ML IJ SOLN: 4.0000 mg | Freq: Four times a day (QID) | INTRAMUSCULAR | Status: DC | PRN

## 2019-03-07 MED ORDER — VERAPAMIL HCL 2.5 MG/ML IV SOLN
INTRAVENOUS | Status: DC | PRN
  Administered 2019-03-07: 10 mL via INTRA_ARTERIAL

## 2019-03-07 MED ORDER — NITROGLYCERIN 1 MG/10 ML FOR IR/CATH LAB
INTRA_ARTERIAL | Status: DC | PRN
  Administered 2019-03-07: 150 ug via INTRACORONARY
  Administered 2019-03-07: 200 ug via INTRA_ARTERIAL

## 2019-03-07 MED ORDER — SODIUM CHLORIDE 0.9 % WEIGHT BASED INFUSION: 1.0000 mL/kg/h | INTRAVENOUS | Status: DC

## 2019-03-07 MED ORDER — LEVOTHYROXINE SODIUM 150 MCG PO TABS
150.0000 ug | ORAL_TABLET | Freq: Every day | ORAL | Status: DC
  Administered 2019-03-08: 150 ug via ORAL
  Filled 2019-03-07: qty 2
  Filled 2019-03-07: qty 1

## 2019-03-07 MED ORDER — VERAPAMIL HCL 2.5 MG/ML IV SOLN
INTRAVENOUS | Status: DC | PRN
  Administered 2019-03-07: 16:00:00 10 mL via INTRA_ARTERIAL

## 2019-03-07 MED ORDER — ASPIRIN 81 MG PO CHEW: 81.0000 mg | CHEWABLE_TABLET | ORAL | Status: DC

## 2019-03-07 MED ORDER — CLOPIDOGREL BISULFATE 75 MG PO TABS
75.0000 mg | ORAL_TABLET | Freq: Every day | ORAL | Status: DC
  Administered 2019-03-08: 75 mg via ORAL
  Filled 2019-03-07: qty 1

## 2019-03-07 MED ORDER — MIDAZOLAM HCL 2 MG/2ML IJ SOLN
INTRAMUSCULAR | Status: AC
  Filled 2019-03-07: qty 2

## 2019-03-07 MED ORDER — MIDAZOLAM HCL 2 MG/2ML IJ SOLN
INTRAMUSCULAR | Status: DC | PRN
  Administered 2019-03-07: 0.5 mg via INTRAVENOUS
  Administered 2019-03-07: 1 mg via INTRAVENOUS

## 2019-03-07 MED ORDER — OXYCODONE HCL 5 MG PO TABS
5.0000 mg | ORAL_TABLET | ORAL | Status: DC | PRN
  Administered 2019-03-07 – 2019-03-08 (×3): 5 mg via ORAL
  Filled 2019-03-07 (×3): qty 1

## 2019-03-07 MED ORDER — LIDOCAINE HCL (PF) 1 % IJ SOLN
INTRAMUSCULAR | Status: AC
  Filled 2019-03-07: qty 30

## 2019-03-07 MED ORDER — NITROGLYCERIN 0.4 MG SL SUBL: 0.4000 mg | SUBLINGUAL_TABLET | SUBLINGUAL | Status: DC | PRN

## 2019-03-07 MED ORDER — SODIUM CHLORIDE 0.9 % WEIGHT BASED INFUSION
3.0000 mL/kg/h | INTRAVENOUS | Status: DC
  Administered 2019-03-07 (×2): 250 mL via INTRAVENOUS

## 2019-03-07 MED ORDER — HEPARIN (PORCINE) IN NACL 1000-0.9 UT/500ML-% IV SOLN
INTRAVENOUS | Status: AC
  Filled 2019-03-07: qty 1000

## 2019-03-07 MED ORDER — POTASSIUM CHLORIDE CRYS ER 20 MEQ PO TBCR
40.0000 meq | EXTENDED_RELEASE_TABLET | Freq: Every day | ORAL | Status: DC
  Administered 2019-03-07 – 2019-03-08 (×2): 40 meq via ORAL
  Filled 2019-03-07 (×2): qty 2

## 2019-03-07 MED ORDER — SODIUM CHLORIDE 0.9 % WEIGHT BASED INFUSION
1.0000 mL/kg/h | INTRAVENOUS | Status: AC
  Administered 2019-03-07: 1 mL/kg/h via INTRAVENOUS

## 2019-03-07 MED ORDER — HYPROMELLOSE (GONIOSCOPIC) 2.5 % OP SOLN
1.0000 [drp] | OPHTHALMIC | Status: DC | PRN
  Filled 2019-03-07: qty 15

## 2019-03-07 MED ORDER — CLOPIDOGREL BISULFATE 300 MG PO TABS
ORAL_TABLET | ORAL | Status: AC
  Filled 2019-03-07: qty 1

## 2019-03-07 MED ORDER — HEPARIN (PORCINE) IN NACL 1000-0.9 UT/500ML-% IV SOLN
INTRAVENOUS | Status: DC | PRN
  Administered 2019-03-07 (×2): 500 mL

## 2019-03-07 MED ORDER — SODIUM CHLORIDE 0.9% FLUSH
3.0000 mL | Freq: Two times a day (BID) | INTRAVENOUS | Status: DC
  Administered 2019-03-07 – 2019-03-08 (×2): 3 mL via INTRAVENOUS

## 2019-03-07 SURGICAL SUPPLY — 20 items
BALLN SAPPHIRE 2.5X15 (BALLOONS) ×2
BALLN ~~LOC~~ EUPHORA RX4.0X27 (BALLOONS) ×2
BALLOON SAPPHIRE 2.5X15 (BALLOONS) ×1 IMPLANT
BALLOON ~~LOC~~ EUPHORA RX4.0X27 (BALLOONS) ×1 IMPLANT
CATH 5FR JL3.5 JR4 ANG PIG MP (CATHETERS) ×2 IMPLANT
CATH INFINITI 4FR JL3.5 (CATHETERS) ×2 IMPLANT
CATH LAUNCHER 5F JR4 (CATHETERS) ×2 IMPLANT
DEVICE RAD COMP TR BAND LRG (VASCULAR PRODUCTS) IMPLANT
DEVICE RAD TR BAND REGULAR (VASCULAR PRODUCTS) ×2 IMPLANT
GLIDESHEATH SLEND SS 6F .021 (SHEATH) ×2 IMPLANT
GUIDEWIRE INQWIRE 1.5J.035X260 (WIRE) ×1 IMPLANT
INQWIRE 1.5J .035X260CM (WIRE) ×2
KIT ENCORE 26 ADVANTAGE (KITS) ×2 IMPLANT
KIT HEART LEFT (KITS) ×2 IMPLANT
PACK CARDIAC CATHETERIZATION (CUSTOM PROCEDURE TRAY) ×2 IMPLANT
STENT RESOLUTE ONYX 3.5X12 (Permanent Stent) ×2 IMPLANT
STENT RESOLUTE ONYX 3.5X38 (Permanent Stent) ×2 IMPLANT
TRANSDUCER W/STOPCOCK (MISCELLANEOUS) ×2 IMPLANT
TUBING CIL FLEX 10 FLL-RA (TUBING) ×2 IMPLANT
WIRE COUGAR XT STRL 190CM (WIRE) ×2 IMPLANT

## 2019-03-07 NOTE — Progress Notes (Signed)
TR  BAND REMOVAL  LOCATION:    right radial  DEFLATED PER PROTOCOL:    Yes.    TIME BAND OFF / DRESSING APPLIED:    22:00   SITE UPON ARRIVAL:    Level 0  SITE AFTER BAND REMOVAL:    Level 0  CIRCULATION SENSATION AND MOVEMENT:    Within Normal Limits   Yes.    COMMENTS:   Post TR band instructions given. Pt tolerated well. 

## 2019-03-07 NOTE — H&P (Signed)
Cardiology Admission History and Physical:   Patient ID: Whitney May MRN: [id number]; DOB: Jan 30, 1961   Admission date: 03/07/2019  Primary Care Provider: Neale Burly, MD Primary Cardiologist: Kate Sable, MD,  Primary Electrophysiologist:  None   Chief Complaint:  Chest Pain  Patient Profile:   Whitney May is a 58 y.o. female with a history of CAD with MI in 07/2018 s/p DES to occluded RCA, PAD with long occlusion of the left SFA and moderately reduced ABI, hyperlipidemia, hypothyroidism, tobacco use, and excessive alcohol use who was transferred from [facility] for a NSTEMI.  History of Present Illness:   [name] is a 58 year old female with a history of CAD with MI in 07/2018 s/p DES to occluded RCA on dual antiplatelet therapy with Aspirin and Plavix, PAD with long occlusion of the left SFA and moderately reduced ABI, hyperlipidemia, hypothyroidism, tobacco use, and excessive alcohol use who is followed by Dr. Bronson Ing for her general cardiac care and Dr. Fletcher Anon for PAD. Patient had a MI in 07/2018 and was found to have a 100% occluded RCA. She underwent successful PCI with DES to that lesion. She was started on Aspirin and Brilinta at that time but has switched changed to Plavix. Echocardiogram at time of MI showed reduced EF of 35-40%. However, repeat Echo in 09/2018 showed improved EF of 50-55% with residual hypokinesis in the basal and mid inferior walls.  Patient presented to the [facility] ED today for evaluation of chest pain. Patient reports onset of substernal chest pain last night at rest that radiated to her jaw, left arm, and back. Patient describes the pain as a pressure with an occasional sharp pain. She notes associated shortness of breath, chills, palpitations, lightheadedness, and dizziness with the pain. She denies any diaphoresis or nausea/vomiting. Patient took 2 sublingual Nitroglycerins last night which helped ease the pain and she was able to go to  sleep; however, she was awakened from pain during the night. She reports some orthopnea last night and left lower extremity edema but denies any PND. She's still having some claudication. No fevers or body aches. She does report some coughing for a while that is productive in the morning. She denies any known exposure to the coronavirus.   In the [facility] ED, EKG showed no acute ischemic changes compared to prior tracings. Initial troponin T elevated at 0.35. Chest x-ray showed no acute findings.  WBC 10.0, Hgb 13.9, Plts 240. Na 136, K 3.1, Glucose 103, SCr 0.61. AST 23.6, ALT 21, Alk Phos 98, Total Bili 0.5. Patient was started on IV Heparin and received Plavix 356m in the ED. She was also given Morphine and 1.5 L of normal saline due to soft BP. [facility] still hooked up to the 5064mbolus with 40 mEq of potassium.   At the time of this evaluation, patient reports pain of at least 4/10 and states it is starting to get worse again. RN reports she was also complaining of feeling dizzy. RN started patient on another 250 mL of normal saline here.  Patient reports a 33 year smoking history and is currently smoking 1/2 pack per day. Patient has alcohol abuse listed in her chart but reports she doesn't drink much anywhere - only wine sometimes. She states her last drink was 1 week ago. She denies any recreational drug use.   Past Medical History:  Diagnosis Date   Alcohol use    Chest pain, unspecified  Hyperlipidemia, mixed    Hypothyroidism    Tobacco abuse     Past Surgical History:  Procedure Laterality Date   CESAREAN SECTION     CORONARY/GRAFT ACUTE MI REVASCULARIZATION N/A 08/02/2018   Procedure: Coronary/Graft Acute MI Revascularization;  Surgeon: Leonie Man, MD;  Location: [place] [facility];  Service: Cardiovascular;  Laterality: N/A;   LEFT HEART CATH AND CORONARY ANGIOGRAPHY N/A 08/02/2018   Procedure: LEFT HEART CATH AND CORONARY  ANGIOGRAPHY;  Surgeon: Leonie Man, MD;  Location: [place] [facility];  Service: Cardiovascular;  Laterality: N/A;     Medications Prior to Admission: Prior to Admission medications   Medication Sig Start Date End Date Taking? Authorizing Provider  aspirin 81 MG chewable tablet Chew 1 tablet (81 mg total) by mouth daily. 08/07/18   Cheryln Manly, NP  CHANTIX 1 MG tablet  09/30/18   [provider]  clopidogrel (PLAVIX) 75 MG tablet Take 4 tablets (370m) by mouth on DAY 1 of medication then take 1 tablet tablet thereafter. 12/02/18   KHerminio Commons MD  HYDROCORTISONE, TOPICAL, 2 % LOTN Apply 1 Product topically 3 (three) times daily. TO AFFECTED AREA 09/16/18   LLendon Colonel NP  hydroxypropyl methylcellulose / hypromellose (ISOPTO TEARS / GONIOVISC) 2.5 % ophthalmic solution Place 1 drop into both eyes as needed for dry eyes.    [provider]  levothyroxine (SYNTHROID, LEVOTHROID) 150 MCG tablet Take 150 mcg by mouth daily. 08/20/18   [provider]  LORazepam (ATIVAN) 0.5 MG tablet Take 0.5 mg by mouth at bedtime. 05/25/18   [provider]  Multiple Vitamin (MULTIVITAMIN WITH MINERALS) TABS tablet Take 1 tablet by mouth daily. 09/25/15   Hongalgi, ALenis Dickinson MD  nicotine (NICODERM CQ - DOSED IN MG/24 HOURS) 21 mg/24hr patch Place 1 patch (21 mg total) onto the skin daily. 09/25/15   Hongalgi, ALenis Dickinson MD  nitroGLYCERIN (NITROSTAT) 0.4 MG SL tablet Place 1 tablet (0.4 mg total) under the tongue every 5 (five) minutes x 3 doses as needed for chest pain. 08/06/18   RCheryln Manly NP  rosuvastatin (CRESTOR) 40 MG tablet TAKE 1 TABLET BY MOUTH  DAILY 12/31/18   LLendon Colonel NP     Allergies:    Allergies  Allergen Reactions   Penicillins Other (See Comments)    Fever, vomiting Has patient had a PCN reaction causing immediate rash, facial/tongue/throat swelling, SOB or lightheadedness with hypotension: NO Has patient had a PCN  reaction causing severe rash involving mucus membranes or skin necrosis: NO Has patient had a PCN reaction that required hospitalization: NO Has patient had a PCN reaction occurring within the last 10 years: NO If all of the above answers are "NO", then may proceed Childhood allergy    Social History:   Social History   Socioeconomic History   Marital status: Married    Spouse name: Not on file   Number of children: Not on file   Years of education: Not on file   Highest education level: Not on file  Occupational History   Not on file  Social Needs   Financial resource strain: Not on file   Food insecurity:    Worry: Not on file    Inability: Not on file   Transportation needs:    Medical: Not on file    Non-medical: Not on file  Tobacco Use   Smoking status: Former Smoker    Packs/day: 1.00    Years: 22.00  Pack years: 22.00    Types: Cigarettes    Last attempt to quit: 08/09/2018    Years since quitting: 0.5   Smokeless tobacco: Never Used  Substance and Sexual Activity   Alcohol use: Yes    Alcohol/week: 8.0 standard drinks    Types: 8 Glasses of wine per week   Drug use: No   Sexual activity: Not on file  Lifestyle   Physical activity:    Days per week: Not on file    Minutes per session: Not on file   Stress: Not on file  Relationships   Social connections:    Talks on phone: Not on file    Gets together: Not on file    Attends religious service: Not on file    Active member of club or organization: Not on file    Attends meetings of clubs or organizations: Not on file    Relationship status: Not on file   Intimate partner violence:    Fear of current or ex partner: Not on file    Emotionally abused: Not on file    Physically abused: Not on file    Forced sexual activity: Not on file  Other Topics Concern   Not on file  Social History Narrative   Married   Gets regular exercise    Family History:   The patient's family history  includes Coronary artery disease in her maternal grandmother and mother; Diabetes in her father; Hypertension in her father.    ROS:  Please see the history of present illness.  Review of Systems  Constitutional: Positive for chills. Negative for fever.  HENT: Negative for congestion.   Respiratory: Positive for cough and shortness of breath. Negative for hemoptysis.   Cardiovascular: Positive for chest pain, palpitations, orthopnea and leg swelling. Negative for PND.  Gastrointestinal: Negative for blood in stool, nausea and vomiting.  Genitourinary: Negative for hematuria.  Musculoskeletal: Positive for back pain. Negative for myalgias.  Neurological: Positive for dizziness. Negative for loss of consciousness.  Endo/Heme/Allergies: Does not bruise/bleed easily.  Psychiatric/Behavioral: Positive for substance abuse.  All other systems reviewed and are negative.  Physical Exam/Data:   Vitals:   03/07/19 1349  BP: 98/67  Pulse: 72  Resp: 16  Temp: 97.6 F (36.4 C)  TempSrc: Oral  SpO2: 97%  Weight: 59.6 kg  Height: _0  (1.575 m)   No intake or output data in the 24 hours ending 03/07/19 1438 Last 3 Weights 03/07/2019 10/08/2018 10/07/2018  Weight (lbs) 131 lb 8 oz 126 lb 3.2 oz 130 lb  Weight (kg) 59.648 kg 57.244 kg 58.968 kg     Physical Exam per MD:  Body mass index is 24.05 kg/m.  General:  Well nourished, well developed, in no acute distress HEENT: normal Lymph: no adenopathy Neck: no JVD Endocrine:  No thryomegaly Vascular: No carotid bruits Cardiac:  normal S1, S2; RRR; no murmur  Lungs:  clear to auscultation bilaterally, no wheezing, rhonchi or rales  Abd: soft, nontender, no hepatomegaly  Ext: no edema Musculoskeletal:  No deformities, BUE and BLE strength normal and equal Skin: warm and dry  Neuro:  CNs 2-12 intact, no focal abnormalities noted Psych:  Normal affect    EKG:  The ECG from [facility] was personally reviewed and demonstrates normal sinus  rhythm, rate 98 bpm, with known LBBB and PAC but no new acute ischemic changes compared to prior tracings.  Relevant CV Studies: Left Heart Catheterization 08/02/2018:  There is mild left ventricular  systolic dysfunction.  The left ventricular ejection fraction is 35-45% by visual estimate.  There is no aortic valve stenosis.  Mid RCA lesion is 20% stenosed.  Mid RCA to Dist RCA lesion is 100% stenosed.  A drug-eluting stent was successfully placed using a STENT SYNERGY DES 3X32. Post Dilated to 3.6 mm  Post intervention, there is a 0% residual stenosis.  Post Atrio lesion is 95% stenosed - distal embolization (too small & distal) -- > IV Aggrastat  Mid LM to Dist LM lesion is 35% stenosed.  Prox LAD-1 lesion is 20% stenosed. Prox LAD-2 lesion is 40% stenosed. Prox LAD-3 lesion is 20% stenosed.   CULPRIT LESION: 100% mid RCA thrombotic occlusion -> treated with Synergy DES stent (3.0 mm x 32 mm postdilated to 3.6 mm) Residual disease in the PDA of roughly 20% improved with nitroglycerin, also distal embolization of thrombus into the distal main RPL branch with some resolution after IC nitroglycerin and Aggrastat. 30 to 40% left main disease with mild 20 to 40% mid LAD disease at takeoff of large diagonal branch. Mildly reduced LVEF of 45 to 50% with inferior hypokinesis.  Plan: Transfer to CCU for post cardiac cath care.  Will run Aggrastat for 6 hours due to but distal embolization and large thrombus burden. Increase Crestor to 40 mg daily. When blood pressure tolerates, start low-dose beta-blocker and possible ACE inhibitor/ARB due to LV dysfunction. Check 2D echocardiogram prior to discharge.  Recommend uninterrupted dual antiplatelet therapy with Aspirin 44m daily and Ticagrelor 984mtwice daily for a minimum of 12 months (ACS - Class I recommendation).  _______________  Echocardiogram 09/23/2018: Study Conclusions: - Left ventricle: The cavity size was normal. Systolic  function was   normal. The estimated ejection fraction was in the range of 50%   to 55%. Wall motion was normal; there were no regional wall   motion abnormalities. Doppler parameters are consistent with   abnormal left ventricular relaxation (grade 1 diastolic   dysfunction).  Impressions: - Since the last study on 08/04/18 LVEF has improved from 35-40% to   45-50%. there is residual hypokinesis in the basal and mid   inferior walls, wall motion abnormalities in the inferolateral   walls have improved.  Laboratory Data:  ChemistryNo results for input(s): NA, K, CL, CO2, GLUCOSE, BUN, CREATININE, CALCIUM, GFRNONAA, GFRAA, ANIONGAP in the last 168 hours.  No results for input(s): PROT, ALBUMIN, AST, ALT, ALKPHOS, BILITOT in the last 168 hours. HematologyNo results for input(s): WBC, RBC, HGB, HCT, MCV, MCH, MCHC, RDW, PLT in the last 168 hours. Cardiac EnzymesNo results for input(s): TROPONINI in the last 168 hours. No results for input(s): TROPIPOC in the last 168 hours.  BNPNo results for input(s): BNP, PROBNP in the last 168 hours.  DDimer No results for input(s): DDIMER in the last 168 hours.  Radiology/Studies:  No results found.  Assessment and Plan:   NSTEMI - [facility] after onset of chest pain last night. Patient had MI in 07/2018 s/p DES to occluded RCA. - EKG shows no acute ischemic changes compared to prior tracings. - Initial Troponin T in [place] elevated at 0.35. - Patient was started on IV Heparin and received Plavix  3004mn the ED. No Nitro due to soft BP. - Patient still having chest pain.  - Continue dual antiplatelet with Aspirin and Plavix.  - Continue Crestor 75m81mily. - Patient not on beta-blocker at home due to hypotension. - Will proceed with  cardiac catheterization.  PAD - Patient has long occlusion of the left SFA and moderately reduced ABI. - Continue dual antiplatelet therapy and statin. - Follow-up  with Dr. Fletcher Anon as directed.  Hypotensive - Patient has been hypotensive with systolic BP in the 45'W.  - It looks like she received 1.5 L of normal saline at [facility] and received another 250 mL bolus on arrival here. - Most recent BP 98/67. - Continue to monitor.   Hyperlipidemia - Will check lipid panel.  - Continue home Crestor 41m daily.  Hypothyroidism - Will check TSH. - Continue home Synthroid.  Tobacco Use - Complete cessation is advised.  History of Alcohol Abuse - Patient states she does not drink much anymore, only wine sometimes. She states her last drink was 1 week ago.   Severity of Illness: The appropriate patient status for this patient is OBSERVATION. Observation status is judged to be reasonable and necessary in order to provide the required intensity of service to ensure the patient's safety. The patient's presenting symptoms, physical exam findings, and initial radiographic and laboratory data in the context of their medical condition is felt to place them at decreased risk for further clinical deterioration. Furthermore, it is anticipated that the patient will be medically stable for discharge from the hospital within 2 midnights of admission. The following factors support the patient status of observation.   " The patient's presenting symptoms include chest pain. " The physical exam findings as above. " The initial radiographic and laboratory data are elevated Troponin.     For questions or updates, please contact CBothell EastPlease consult [email or web address] for contact info under        Signed, CDarreld Mclean PA-C  03/07/2019 2:38 PM   Personally seen and examined. Agree with above.   58year old with known coronary artery disease here with chest discomfort, elevated troponin consistent with non-ST elevation myocardial infarction.  Transferred.  GEN: Well nourished, well developed, in no acute distress  HEENT: normal  Neck: no JVD, carotid  bruits, or masses Cardiac: RRR; no murmurs, rubs, or gallops,no edema  Respiratory:  clear to auscultation bilaterally, normal work of breathing GI: soft, nontender, nondistended, + BS MS: no deformity or atrophy  Skin: warm and dry, no rash Neuro:  Alert and Oriented x 3, Strength and sensation are intact Psych: euthymic mood, full affect  Troponin 0.35 elevated previously drawn.  EKG with subtle ST elevation in lead III inferiorly personally reviewed.  Echo EF previously had returned to low normal 50%, prior to that 35%  Prior cardiac catheterization reviewed-occluded RCA  PAD-long occlusion of left superficial femoral artery.  Assessment and plan:  Non-ST elevation myocardial infarction Coronary artery disease status post MI September 2019-DES to RCA Peripheral arterial disease- occlusion of left SFA Tobacco use Prior alcohol use Hyperlipidemia Ischemic cardiomyopathy- improved from 35-55%  -With her ongoing chest discomfort, elevated troponin we will go ahead and proceed with cardiac catheterization.  Risks benefits have been explained including stroke heart attack death renal impairment bleeding.  She is willing to proceed. -Continue to monitor soft blood pressure.  IV fluid boluses have been administered.   -Continue to advise tobacco cessation - Watch for any signs of alcohol withdrawal, she does state that she only drinks wine occasionally, last drink was about 1 week ago. - Continue with Crestor, Plavix, currently on IV heparin, aspirin.  Unable to give beta-blocker because of hypotension.  MCandee Furbish MD

## 2019-03-07 NOTE — Interval H&P Note (Signed)
History and Physical Interval Note:  03/07/2019 4:45 PM  Whitney May  has presented today for surgery, with the diagnosis of NSTEMI.  The various methods of treatment have been discussed with the patient and family. After consideration of risks, benefits and other options for treatment, the patient has consented to  Procedure(s): LEFT HEART CATH AND CORONARY ANGIOGRAPHY (N/A) CORONARY STENT INTERVENTION (N/A) as a surgical intervention.  The patient's history has been reviewed, patient examined, no change in status, stable for surgery.  I have reviewed the patient's chart and labs.  Questions were answered to the patient's satisfaction.     Dennisse Swader

## 2019-03-07 NOTE — Progress Notes (Signed)
   Giving K-DUR now and in the am.  K 3.1 at [place]. Repeating in AM  Donato Schultz, MD

## 2019-03-08 ENCOUNTER — Encounter: Payer: Self-pay | Admitting: Physician Assistant

## 2019-03-08 DIAGNOSIS — I447 Left bundle-branch block, unspecified: Secondary | ICD-10-CM | POA: Diagnosis not present

## 2019-03-08 DIAGNOSIS — Z955 Presence of coronary angioplasty implant and graft: Secondary | ICD-10-CM

## 2019-03-08 DIAGNOSIS — F17210 Nicotine dependence, cigarettes, uncomplicated: Secondary | ICD-10-CM | POA: Diagnosis not present

## 2019-03-08 DIAGNOSIS — Z20828 Contact with and (suspected) exposure to other viral communicable diseases: Secondary | ICD-10-CM | POA: Diagnosis not present

## 2019-03-08 DIAGNOSIS — I25110 Atherosclerotic heart disease of native coronary artery with unstable angina pectoris: Secondary | ICD-10-CM | POA: Diagnosis not present

## 2019-03-08 DIAGNOSIS — E039 Hypothyroidism, unspecified: Secondary | ICD-10-CM | POA: Diagnosis not present

## 2019-03-08 DIAGNOSIS — I255 Ischemic cardiomyopathy: Secondary | ICD-10-CM

## 2019-03-08 DIAGNOSIS — T82855A Stenosis of coronary artery stent, initial encounter: Secondary | ICD-10-CM | POA: Diagnosis not present

## 2019-03-08 DIAGNOSIS — Z720 Tobacco use: Secondary | ICD-10-CM

## 2019-03-08 DIAGNOSIS — Z79899 Other long term (current) drug therapy: Secondary | ICD-10-CM | POA: Diagnosis not present

## 2019-03-08 DIAGNOSIS — Z79890 Hormone replacement therapy (postmenopausal): Secondary | ICD-10-CM | POA: Diagnosis not present

## 2019-03-08 DIAGNOSIS — I214 Non-ST elevation (NSTEMI) myocardial infarction: Secondary | ICD-10-CM | POA: Diagnosis not present

## 2019-03-08 DIAGNOSIS — Z7902 Long term (current) use of antithrombotics/antiplatelets: Secondary | ICD-10-CM | POA: Diagnosis not present

## 2019-03-08 DIAGNOSIS — I252 Old myocardial infarction: Secondary | ICD-10-CM | POA: Diagnosis not present

## 2019-03-08 DIAGNOSIS — E782 Mixed hyperlipidemia: Secondary | ICD-10-CM | POA: Diagnosis not present

## 2019-03-08 DIAGNOSIS — Z7982 Long term (current) use of aspirin: Secondary | ICD-10-CM | POA: Diagnosis not present

## 2019-03-08 DIAGNOSIS — Z8249 Family history of ischemic heart disease and other diseases of the circulatory system: Secondary | ICD-10-CM | POA: Diagnosis not present

## 2019-03-08 DIAGNOSIS — I739 Peripheral vascular disease, unspecified: Secondary | ICD-10-CM | POA: Diagnosis not present

## 2019-03-08 DIAGNOSIS — Z880 Allergy status to penicillin: Secondary | ICD-10-CM | POA: Diagnosis not present

## 2019-03-08 LAB — LIPID PANEL
Cholesterol: 87 mg/dL (ref 0–200)
HDL: 27 mg/dL — ABNORMAL LOW (ref >40)
LDL Cholesterol: 39 mg/dL (ref 0–99)
Total CHOL/HDL Ratio: 3.2 RATIO
Triglycerides: 103 mg/dL (ref <150)
VLDL: 21 mg/dL (ref 0–40)

## 2019-03-08 LAB — CBC
HCT: 34.5 % — ABNORMAL LOW (ref 36.0–46.0)
Hemoglobin: 11.5 g/dL — ABNORMAL LOW (ref 12.0–15.0)
MCH: 30.1 pg (ref 26.0–34.0)
MCHC: 33.3 g/dL (ref 30.0–36.0)
MCV: 90.3 fL (ref 80.0–100.0)
Platelets: 199 10*3/uL (ref 150–400)
RBC: 3.82 MIL/uL — ABNORMAL LOW (ref 3.87–5.11)
RDW: 14.1 % (ref 11.5–15.5)
WBC: 9.3 10*3/uL (ref 4.0–10.5)
nRBC: 0 % (ref 0.0–0.2)

## 2019-03-08 LAB — BASIC METABOLIC PANEL
Anion gap: 8 (ref 5–15)
BUN: 6 mg/dL (ref 6–20)
CO2: 22 mmol/L (ref 22–32)
Calcium: 8.4 mg/dL — ABNORMAL LOW (ref 8.9–10.3)
Chloride: 108 mmol/L (ref 98–111)
Creatinine, Ser: 0.69 mg/dL (ref 0.44–1.00)
GFR calc Af Amer: >60 mL/min (ref >60)
GFR calc non Af Amer: >60 mL/min (ref >60)
Glucose, Bld: 90 mg/dL (ref 70–99)
Potassium: 3.8 mmol/L (ref 3.5–5.1)
Sodium: 138 mmol/L (ref 135–145)

## 2019-03-08 LAB — SARS CORONAVIRUS 2 BY RT PCR (HOSPITAL ORDER, PERFORMED IN ~~LOC~~ HOSPITAL LAB): SARS Coronavirus 2: NEGATIVE

## 2019-03-08 LAB — TSH: TSH: 0.014 u[IU]/mL — ABNORMAL LOW (ref 0.350–4.500)

## 2019-03-08 LAB — TROPONIN I: Troponin I: 2.23 ng/mL (ref <0.03)

## 2019-03-08 MED ORDER — ASPIRIN 81 MG PO CHEW
81.0000 mg | CHEWABLE_TABLET | Freq: Once | ORAL | Status: AC
  Administered 2019-03-08: 81 mg via ORAL
  Filled 2019-03-08: qty 1

## 2019-03-08 MED ORDER — HEART ATTACK BOUNCING BOOK
Freq: Once | Status: AC
  Administered 2019-03-08: 1
  Filled 2019-03-08: qty 1

## 2019-03-08 MED ORDER — ANGIOPLASTY BOOK
Freq: Once | Status: AC
  Administered 2019-03-08: 1
  Filled 2019-03-08: qty 1

## 2019-03-08 MED ORDER — CLOPIDOGREL BISULFATE 75 MG PO TABS: 75.0000 mg | ORAL_TABLET | Freq: Every day | ORAL | 3 refills | Status: DC

## 2019-03-08 NOTE — Progress Notes (Signed)
Spoke with [place], [place] and Dr Johney Frame concerning the patient's boyfriend testing positive for COVID19. Dr Johney Frame stated pt is stable to still be discharged and shows no signs or symptoms of COVID19.

## 2019-03-08 NOTE — Progress Notes (Signed)
Informed by nursing staff that the patient's boyfriend was tested positive for COVID19 recently even though so far she did not display any symptom consistent with COVID19 such as fever, chill or cough. Informed Dr. Johney Frame

## 2019-03-08 NOTE — Progress Notes (Signed)
Cardiac Rehab Advisory Cardiac Rehab Phase I is not seeing pts face to face at this time due to Covid 19 restrictions. Ambulation is occurring through nursing, PT, and mobility teams. We will help facilitate that process as needed. We are calling pts in their rooms and discussing education. We will then deliver education materials to pts RN for delivery to pt.   Spoke with pt by phone. She sts she is having bilateral shoulder aching this am. She doesn't notice a change when she walks to bathroom. Encouraged her to walk in hall with staff this am to see if any change and communicate with MD. Discussed MI, stents, Plavix, restrictions, smoking cessation, diet, exercise at home, NTG, and CRPII. Pt voiced understanding. She attempted to quit smoking in the fall. Her family smokes as well. Discussed methods and resources. She thinks she will try again. Will refer to [facility] CRPII however she was unable to do last fall due to cost.  [phone] Ethelda Chick CES, ACSM 9:34 AM 03/08/2019

## 2019-03-08 NOTE — Progress Notes (Addendum)
Spoke with Dr Johney Frame concerning patient's boyfriend testing positive for COVID19. Patient's family is already on their way to come pick pt up. Dr Johney Frame stated pt was able to still leave as long as we had a good contact number to call and give her the results of test. Testing (Cepheid) swab has been performed and results are pending.   Best contact number for [name]: [phone]

## 2019-03-08 NOTE — Progress Notes (Signed)
Received information from [place] (ID) that patients boyfriend has tested positive for COVID 19 at [facility].  I have spoken the patient.  Her last exposure to him was 1 week ago.  She denies fevers, chills, cough, SOB/CP (outside of her ACS presentation). I have spoken with Dr Drue Second who advises testing [facility]) of the patient and 1 more week of quarantine.  If the patient develops tests positive or develops symptoms, then further quaratine of her may be required. No required quarantine for providers.  Hillis Range MD, [facility] 03/08/2019 12:44 PM

## 2019-03-08 NOTE — Discharge Summary (Addendum)
Discharge Summary    Patient ID: Whitney May MRN: [id number]; DOB: [birth year]  Admit date: 03/07/2019 Discharge date: 03/08/2019  Primary Care Provider: Neale Burly, MD  Primary Cardiologist: Whitney Sable, MD Primary Electrophysiologist:  None   Discharge Diagnoses    Principal Problem:   NSTEMI (non-ST elevated myocardial infarction) [facility]) Active Problems:   Hypothyroidism   Hyperlipidemia, mixed   Tobacco abuse   Alcohol use   Ischemic cardiomyopathy   Allergies Allergies  Allergen Reactions   Penicillins Other (See Comments)    Fever, vomiting Has patient had a PCN reaction causing immediate rash, facial/tongue/throat swelling, SOB or lightheadedness with hypotension: NO Has patient had a PCN reaction causing severe rash involving mucus membranes or skin necrosis: NO Has patient had a PCN reaction that required hospitalization: NO Has patient had a PCN reaction occurring within the last 10 years: NO If all of the above answers are "NO", then may proceed Childhood allergy    Diagnostic Studies/Procedures    Cath 03/07/2019  Mid LM to Dist LM lesion is 50% stenosed.  Prox LAD-1 lesion is 20% stenosed.  Prox LAD-2 lesion is 40% stenosed.  Prox LAD-3 lesion is 20% stenosed.  Mid RCA lesion is 20% stenosed.  Previously placed Mid RCA to Dist RCA drug eluting stent is widely patent.  Balloon angioplasty was performed.  Dist RCA lesion is 99% stenosed.  RPDA lesion is 40% stenosed.  Ost Cx to Prox Cx lesion is 50% stenosed.   Findings:  1. 3v CAD with 50% distal LM and ostial LCX disease 2. Severe in-stent stenosis/plaque rupture of RCA stent 3. iCM EF 35-40%  Plan/Discussion:  PCI RCA. Needs aggressive RF modification with smoking cessation.   Successful PCI of the RCA with overlapping DES (3.5x38 and 3.5x12 mm Resolute Onyx) for treatment of severe in-stent restenosis in patient with ACS  Recommend: ASA/Clopidogrel at least 12 months,  consider indefinite. Tobacco cessation and secondary risk reduction efforts.  _____________   History of Present Illness     [name] is a 58 year old female with a history of CAD with MI in 07/2018 s/p DES to occluded RCA on dual antiplatelet therapy with Aspirin and Plavix, PAD with long occlusion of the left SFA and moderately reduced ABI, hyperlipidemia, hypothyroidism, tobacco use, and excessive alcohol use who is followed by Dr. Bronson May for her general cardiac care and Whitney May for PAD. Patient had a MI in 07/2018 and was found to have a 100% occluded RCA. She underwent successful PCI with DES to that lesion. She was started on Aspirin and Brilinta at that time but has switched changed to Plavix. Echocardiogram at time of MI showed reduced EF of 35-40%. However, repeat Echo in 09/2018 showed improved EF of 50-55% with residual hypokinesis in the basal and mid inferior walls.  Patient presented to the [facility] ED today for evaluation of chest pain. Patient reports onset of substernal chest pain last night at rest that radiated to her jaw, left arm, and back. Patient describes the pain as a pressure with an occasional sharp pain. She notes associated shortness of breath, chills, palpitations, lightheadedness, and dizziness with the pain. She denies any diaphoresis or nausea/vomiting. Patient took 2 sublingual Nitroglycerins last night which helped ease the pain and she was able to go to sleep; however, she was awakened from pain during the night. She reports some orthopnea last night and left lower extremity edema but denies any PND. She's still having some claudication. No  fevers or body aches. She does report some coughing for a while that is productive in the morning. She denies any known exposure to the coronavirus.   In the [facility] ED, EKG showed no acute ischemic changes compared to prior tracings. Initial troponin T elevated at 0.35. Chest x-ray showed no acute findings.  WBC 10.0,  Hgb 13.9, Plts 240. Na 136, K 3.1, Glucose 103, SCr 0.61. AST 23.6, ALT 21, Alk Phos 98, Total Bili 0.5. Patient was started on IV Heparin and received Plavix 336m in the ED. She was also given Morphine and 1.5 L of normal saline due to soft BP. [facility] patient was still hooked up to the 5060mbolus with 40 mEq of potassium.   At the time of this evaluation, patient reports pain of at least 4/10 and states it is starting to get worse again. RN reports she was also complaining of feeling dizzy. RN started patient on another 250 mL of normal saline here.  Patient reports a 33 year smoking history and is currently smoking 1/2 pack per day. Patient has alcohol abuse listed in her chart but reports she doesn't drink much anywhere - only wine sometimes. She states her last drink was 1 week ago. She denies any recreational drug use.   Hospital Course     Consultants:    Troponin I on arrival was 1.02.  Patient underwent cardiac catheterization on the same day which revealed a 50% mid to distal left main disease, 40% proximal LAD disease, 20% mid RCA disease, 99% distal RCA lesion which was likely related to plaque rupture. She underwent successful PCI of RCA with overlapping 3.5 x 38 mm and a 3.5 x 12 mm resolute Onyx DES.  She was seen in the morning on 03/08/2019, at which time she was doing well from cardiology perspective.  She is deemed stable for discharge.  Looking cessation has been strongly advised.  She is also aware that she will need to be compliant with aspirin and Plavix for at least 12 months after every new stent placement.  I will arrange 1 week outpatient telehealth visit.  During this hospitalization, her TSH was very low at 0.014.  She is on 150 mcg of Synthroid at home, she is probably taking too much.  We will defer management of her thyroid medication to her PCP. Her BP was persistently in 90 to low 100, she is not on a BB.   Note, [place] 3.[place]. She will need BMET in 2 weeks to followup on this.   I will arrange 1 week TOC followup, she does not have Internet or computer so cannot do doxy.me. She does have a LG smart phone, may try either doximity video conference visit and if that fails, a telephone visit.  _____________  Discharge Vitals Blood pressure (!) 96/55, pulse 81, temperature 98 F (36.7 C), temperature source Oral, resp. rate 16, height _0  (1.575 m), weight 60.1 kg, SpO2 98 %.  Filed Weights   03/07/19 1349 03/08/19 0435  Weight: 59.6 kg 60.1 kg    Labs & Radiologic Studies    CBC Recent Labs    03/08/19 0141  WBC 9.3  HGB 11.5*  HCT 34.5*  MCV 90.3  PLT 19300 Basic Metabolic Panel Recent Labs    03/08/19 0141  NA 138  K 3.8  CL 108  CO2 22  GLUCOSE 90  BUN 6  CREATININE 0.69  CALCIUM 8.4*   Liver Function Tests No results for input(s): AST, ALT, ALKPHOS, BILITOT, PROT, ALBUMIN in the last 72 hours. No results for input(s): LIPASE, AMYLASE in the last 72 hours. Cardiac Enzymes Recent Labs    03/07/19 1516 03/07/19 2026 03/08/19 0141  TROPONINI 1.02* 1.71* 2.23*   BNP Invalid input(s): POCBNP D-Dimer No results for input(s): DDIMER in the last 72 hours. Hemoglobin A1C No results for input(s): HGBA1C in the last 72 hours. Fasting Lipid Panel Recent Labs    03/08/19 0141  CHOL 87  HDL 27*  LDLCALC 39  TRIG 103  CHOLHDL 3.2   Thyroid Function Tests Recent Labs    03/08/19 0141  TSH 0.014*   _____________  No results found. Disposition   Pt is being discharged home today in good condition.  Follow-up Plans & Appointments    Follow-up Information    Herminio Commons, MD Follow up.   Specialty:  Cardiology Why:  1 week TOC followup Contact information: [address] [place] [address] [phone]        Whitney Burly, MD. Schedule an appointment as soon as possible for a visit.   Specialty:  Internal Medicine Why:  check thyroid  medication dosing, TSH was 0.014, suggesting that you maybe taking too much levothyroxine Contact information: [place] [address] [phone]          Discharge Instructions    Amb Referral to Cardiac Rehabilitation   Complete by:  As directed    Diagnosis:   PTCA Coronary Stents NSTEMI        Discharge Medications   Allergies as of 03/08/2019      Reactions   Penicillins Other (See Comments)   Fever, vomiting Has patient had a PCN reaction causing immediate rash, facial/tongue/throat swelling, SOB or lightheadedness with hypotension: NO Has patient had a PCN reaction causing severe rash involving mucus membranes or skin necrosis: NO Has patient had a PCN reaction that required hospitalization: NO Has patient had a PCN reaction occurring within the last 10 years: NO If all of the above answers are "NO", then may proceed Childhood allergy      Medication List    TAKE these medications   aspirin 81 MG chewable tablet Chew 1 tablet (81 mg total) by mouth daily.   clopidogrel 75 MG tablet Commonly known as:  PLAVIX Take 1 tablet (75 mg total) by mouth daily.   HYDROCORTISONE (TOPICAL) 2 % Lotn Apply 1 Product topically 3 (three) times daily. TO AFFECTED AREA   levothyroxine 150 MCG tablet Commonly known as:  SYNTHROID Take 150 mcg by mouth daily.   multivitamin with minerals Tabs tablet Take 1 tablet by mouth daily.   nicotine 21 mg/24hr patch Commonly known as:  NICODERM CQ - dosed in mg/24 hours Place 1 patch (21 mg total) onto the skin daily.   nitroGLYCERIN 0.4 MG SL tablet Commonly known as:  NITROSTAT Place 1 tablet (0.4 mg total) under the tongue every 5 (five) minutes x 3 doses as needed for chest pain.   rosuvastatin 40 MG tablet Commonly known as:  CRESTOR TAKE 1 TABLET BY MOUTH  DAILY        Acute coronary syndrome (MI, NSTEMI, STEMI, etc) this admission?: Yes.     AHA/ACC Clinical Performance & Quality  Measures: 1. Aspirin prescribed? - Yes 2. ADP Receptor Inhibitor (Plavix/Clopidogrel, Brilinta/Ticagrelor or Effient/Prasugrel) prescribed (includes medically managed patients)? - Yes 3. Beta Blocker prescribed? -  No - chronically low BP 4. High Intensity Statin (Lipitor 40-49m or Crestor 20-440m prescribed? - Yes 5. EF assessed during THIS hospitalization? - Yes 6. For EF <40%, was ACEI/ARB prescribed? - No - Reason:  May consider as outpatient 7. For EF <40%, Aldosterone Antagonist (Spironolactone or Eplerenone) prescribed? - No - Reason:  low BP 8. Cardiac Rehab Phase II ordered (Included Medically managed Patients)? - Yes     Outstanding Labs/Studies   BMET in 2 weeks for followup on low K  Duration of Discharge Encounter   Greater than 30 minutes including physician time.  SiHilbert CorriganPA 03/08/2019, 11:25 AM   JaThompson GrayerD, FAUnion Hospital Of Cecil CountyHAdventist Health [facility]

## 2019-03-08 NOTE — Progress Notes (Signed)
Doing well today s/p PCI yesterday.  DC to home with medical therapy.  Per Dr Excell Seltzer, would plan ASA and plavix for at least 12 months. Smoking cessation advised.  Will need close outpatient cardiology care.  Hillis Range MD, [facility] 03/08/2019 9:54 AM

## 2019-03-10 ENCOUNTER — Telehealth: Payer: Self-pay | Admitting: Cardiovascular Disease

## 2019-03-10 ENCOUNTER — Encounter (HOSPITAL_COMMUNITY): Payer: Self-pay | Admitting: Internal Medicine

## 2019-03-10 NOTE — Telephone Encounter (Signed)
Patient contacted regarding discharge from [facility] on 03/08/2019.    Patient understands to follow up with Dr. Purvis Sheffield on Friday, 03/14/2019 at 9:20 (virtual visit).   Patient understands discharge instructions?  Yes  Patient understands medications and regiment?  Yes  Patient understands to bring all medications to this visit?  Virtual Visit    The patient verbally consented for a telehealth (video) visit with [facility] and understands that his/her insurance company will be billed for the encounter.  She does not have scales or BP monitor at home.    Patient had multiple questions in regards to when she can return to work.  Stated she was exposed to Covid 19 & was told the earliest she could go back to work would be 03/17/2019.  She is physically seeing her pmd tomorrow in office to follow up on thyroid issue.  Recommended that she discuss the Covid 19 issue with pmd as they can further guide her on this.  Dr Purvis Sheffield can give her further advice about returning to work from a cardiac perspective after the telehealth visit on 03/14/2019.  She verbalized understanding.

## 2019-03-10 NOTE — Telephone Encounter (Signed)
TOC:  Discharged for NSTEMI, s/p 2 DES. Need 1 week TOC telehealth visit. She does not have computer or internet. She does have a LG smartphone, need to test doximity or doxy.me video conference feature first, it that does not work, then telephone visit.

## 2019-03-11 ENCOUNTER — Telehealth: Payer: Self-pay | Admitting: *Deleted

## 2019-03-11 NOTE — Telephone Encounter (Signed)
Patient verbally consented for telehealth visits with [facility] and understands that her insurance company will be billed for the encounter. Unable to check vitals for visit

## 2019-03-14 ENCOUNTER — Telehealth (INDEPENDENT_AMBULATORY_CARE_PROVIDER_SITE_OTHER): Payer: BLUE CROSS/BLUE SHIELD | Admitting: Cardiovascular Disease

## 2019-03-14 ENCOUNTER — Telehealth: Payer: Self-pay | Admitting: Cardiovascular Disease

## 2019-03-14 ENCOUNTER — Encounter: Payer: Self-pay | Admitting: Cardiovascular Disease

## 2019-03-14 DIAGNOSIS — E785 Hyperlipidemia, unspecified: Secondary | ICD-10-CM

## 2019-03-14 DIAGNOSIS — M79601 Pain in right arm: Secondary | ICD-10-CM

## 2019-03-14 DIAGNOSIS — I255 Ischemic cardiomyopathy: Secondary | ICD-10-CM

## 2019-03-14 DIAGNOSIS — E039 Hypothyroidism, unspecified: Secondary | ICD-10-CM

## 2019-03-14 DIAGNOSIS — I214 Non-ST elevation (NSTEMI) myocardial infarction: Secondary | ICD-10-CM

## 2019-03-14 DIAGNOSIS — Z720 Tobacco use: Secondary | ICD-10-CM

## 2019-03-14 DIAGNOSIS — I739 Peripheral vascular disease, unspecified: Secondary | ICD-10-CM

## 2019-03-14 DIAGNOSIS — Z955 Presence of coronary angioplasty implant and graft: Secondary | ICD-10-CM

## 2019-03-14 MED ORDER — LEVOTHYROXINE SODIUM 75 MCG PO TABS: 75.0000 ug | ORAL_TABLET | Freq: Every day | ORAL | 6 refills | Status: DC

## 2019-03-14 NOTE — Patient Instructions (Addendum)
Medication Instructions:   Decrease Levothyroxine to daily.  New prescription sent to pharmacy today.   Continue all other medications.    Labwork:  TSH (thyroid) - order enclosed.  Please do lab in 6 weeks.      Can do at [facility], lab is located across from [facility].   Testing/Procedures: Your physician has requested that you have an echocardiogram. Echocardiography is a painless test that uses sound waves to create images of your heart. It provides your doctor with information about the size and shape of your heart and how well your heart's chambers and valves are working. This procedure takes approximately one hour. There are no restrictions for this procedure. - okay to do in 1 month.   Follow-Up:  Office will contact with results via phone or letter.    3 months   Any Other Special Instructions Will Be Listed Below (If Applicable).  If you need a refill on your cardiac medications before your next appointment, please call your pharmacy.

## 2019-03-14 NOTE — Telephone Encounter (Signed)
°  Precert needed for: Echo    Location: [facility]   Date: Mar 20, 2019

## 2019-03-14 NOTE — Progress Notes (Addendum)
Virtual Visit via Video Note   This visit type was conducted due to national recommendations for restrictions regarding the COVID-19 Pandemic (e.g. social distancing) in an effort to limit this patient's exposure and mitigate transmission in our community.  Due to her co-morbid illnesses, this patient is at least at moderate risk for complications without adequate follow up.  This format is felt to be most appropriate for this patient at this time.  All issues noted in this document were discussed and addressed.  A limited physical exam was performed with this format.  Please refer to the patient's chart for her consent to telehealth for [facility].   Date:  03/14/2019   ID:  Whitney May, DOB 04-28-1961, MRN [id number]  Patient Location: Home Provider Location: Home  PCP:  Toma Deiters, MD  Cardiologist:  Prentice Docker, MD  Electrophysiologist:  None   Evaluation Performed:  Transition of Care appt  Chief Complaint:  CAD  History of Present Illness:    Whitney May is a 58 y.o. female with a history of CAD with MI in 07/2018 s/p DES to occluded RCA, PAD with long occlusion of the left SFA and moderately reduced ABI, hyperlipidemia, hypothyroidism, tobacco use, and excessive alcohol use who was transferred from [facility] for a NSTEMI.  She underwent successful PCI of the RCA with overlapping DES (3.5x38 and 3.5x12 mm Resolute Onyx) for treatment of severe in-stent restenosis.  She was hospitalized for an inferior STEMI in Sept 2019 and underwent PCI of the RCA.  Her boyfriend tested positive for COVID 19 at [facility]. The patient was tested and found to be negative.  It was advised she self quarantine for an additional week. She was asymptomatic with respect to fevers/chills, cough, and shortness of breath.  I have not evaluated this patient since November 2016.  She was cooking and picked up some plates out of the oven about 3 days ago and used her right hand and since  then, she has had some mild aching with a little swelling of the right forearm. Denies right hand/forearm coldness.   She denies chest pain and shortness of breath. She denies leg swelling, tingling, and numbness.  She has some left calf and foot swelling if she has been standing for long periods of time at work.  Her son and daughter live with her.  The patient does not have symptoms concerning for COVID-19 infection (fever, chills, cough, or new shortness of breath).    Past Medical History:  Diagnosis Date  . Alcohol use   . Chest pain, unspecified   . Hyperlipidemia, mixed   . Hypothyroidism   . Tobacco abuse    Past Surgical History:  Procedure Laterality Date  . CESAREAN SECTION    . CORONARY STENT INTERVENTION N/A 03/07/2019   Procedure: CORONARY STENT INTERVENTION;  Surgeon: Tonny Bollman, MD;  Location: [facility] INVASIVE CV LAB;  Service: Cardiovascular;  Laterality: N/A;  . CORONARY/GRAFT ACUTE MI REVASCULARIZATION N/A 08/02/2018   Procedure: Coronary/Graft Acute MI Revascularization;  Surgeon: Marykay Lex, MD;  Location: [facility] INVASIVE CV LAB;  Service: Cardiovascular;  Laterality: N/A;  . LEFT HEART CATH AND CORONARY ANGIOGRAPHY N/A 08/02/2018   Procedure: LEFT HEART CATH AND CORONARY ANGIOGRAPHY;  Surgeon: Marykay Lex, MD;  Location: [facility] INVASIVE CV LAB;  Service: Cardiovascular;  Laterality: N/A;  . LEFT HEART CATH AND CORONARY ANGIOGRAPHY N/A 03/07/2019   Procedure: LEFT HEART CATH AND CORONARY ANGIOGRAPHY;  Surgeon: Dolores Patty, MD;  Location: [facility] INVASIVE CV LAB;  Service: Cardiovascular;  Laterality: N/A;     Current Meds  Medication Sig  . aspirin 81 MG chewable tablet Chew 1 tablet (81 mg total) by mouth daily.  . clopidogrel (PLAVIX) 75 MG tablet Take 1 tablet (75 mg total) by mouth daily.  Marland Kitchen levothyroxine (SYNTHROID, LEVOTHROID) 150 MCG tablet Take 150 mcg by mouth daily.  . Multiple Vitamin (MULTIVITAMIN WITH MINERALS) TABS tablet Take 1 tablet by mouth  daily.  . nitroGLYCERIN (NITROSTAT) 0.4 MG SL tablet Place 1 tablet (0.4 mg total) under the tongue every 5 (five) minutes x 3 doses as needed for chest pain.  . rosuvastatin (CRESTOR) 40 MG tablet TAKE 1 TABLET BY MOUTH  DAILY (Patient taking differently: Take 40 mg by mouth daily. )     Allergies:   Penicillins   Social History   Tobacco Use  . Smoking status: Former Smoker    Packs/day: 1.00    Years: 22.00    Pack years: 22.00    Types: Cigarettes    Last attempt to quit: 08/09/2018    Years since quitting: 0.5  . Smokeless tobacco: Never Used  Substance Use Topics  . Alcohol use: Yes    Alcohol/week: 8.0 standard drinks    Types: 8 Glasses of wine per week  . Drug use: No     Family Hx: The patient's family history includes Coronary artery disease in her maternal grandmother and mother; Diabetes in her father; Hypertension in her father.  ROS:   Please see the history of present illness.     All other systems reviewed and are negative.   Prior CV studies:   The following studies were reviewed today:  Cath 03/07/19:   Mid LM to Dist LM lesion is 50% stenosed.  Prox LAD-1 lesion is 20% stenosed.  Prox LAD-2 lesion is 40% stenosed.  Prox LAD-3 lesion is 20% stenosed.  Mid RCA lesion is 20% stenosed.  Previously placed Mid RCA to Dist RCA drug eluting stent is widely patent.  Balloon angioplasty was performed.  Dist RCA lesion is 99% stenosed.  RPDA lesion is 40% stenosed.  Ost Cx to Prox Cx lesion is 50% stenosed.   Findings:  1. 3v CAD with 50% distal LM and ostial LCX disease 2. Severe in-stent stenosis/plaque rupture of RCA stent 3. iCM EF 35-40%  Plan/Discussion:  PCI RCA. Needs aggressive RF modification with smoking cessation.   Successful PCI of the RCA with overlapping DES (3.5x38 and 3.5x12 mm Resolute Onyx) for treatment of severe in-stent restenosis in patient with ACS  Recommend: ASA/Clopidogrel at least 12 months, consider  indefinite. Tobacco cessation and secondary risk reduction efforts.  Labs/Other Tests and Data Reviewed:    EKG:  No ECG reviewed.  Recent Labs: 08/02/2018: ALT 16 03/08/2019: BUN 6; Creatinine, Ser 0.69; Hemoglobin 11.5; Platelets 199; Potassium 3.8; Sodium 138; TSH 0.014   Recent Lipid Panel Lab Results  Component Value Date/Time   CHOL 87 03/08/2019 01:41 AM   TRIG 103 03/08/2019 01:41 AM   HDL 27 (L) 03/08/2019 01:41 AM   CHOLHDL 3.2 03/08/2019 01:41 AM   LDLCALC 39 03/08/2019 01:41 AM    Wt Readings from Last 3 Encounters:  03/08/19 132 lb 6.4 oz (60.1 kg)  10/08/18 126 lb 3.2 oz (57.2 kg)  10/07/18 130 lb (59 kg)     Objective:    Vital Signs:  There were no vitals taken for this visit.   VITAL SIGNS:  reviewed GEN:  no acute distress EYES:  sclerae anicteric, EOMI - Extraocular Movements Intact RESPIRATORY:  normal respiratory effort, symmetric expansion CARDIOVASCULAR:  no peripheral edema SKIN:  no rash, lesions or ulcers. MUSCULOSKELETAL:  no obvious deformities. NEURO:  alert and oriented x 3, no obvious focal deficit PSYCH:  normal affect  ASSESSMENT & PLAN:    1. CAD: S/p PCI of the RCA with overlapping DES (3.5x38 and 3.5x12 mm Resolute Onyx) for treatment of severe in-stent restenosis in context of NSTEMI. LVEF by LV gram 35-40%. Echo in November 2019 showed LVEF 50-55%.  I will order a 2-D echocardiogram with Doppler to evaluate cardiac structure, function, and regional wall motion. Continue DAPT indefinitely with ASA and Plavix. Continue Crestor 40 mg. Needs tobacco cessation.  2. PAD: Patient has long occlusion of the left SFA and moderately reduced ABI. Followed by Dr. Kirke Corin. Needs smoking cessation. On ASA, Plavix, and statin.  3. HLD: LDL 39 on 03/08/19. Continue Crestor 40 mg.  4. Tobacco abuse: Needs cessation.  5. Right forearm pain: She likely caused some mild trauma with heavy lifting. There is no cold sensation of the arm to suggest loss of  blood flow. Hand/finger movements are normal. There is no gross swelling or redness. I have recommended ice packs and Tylenol for pain and swelling. I told her to keep it rested for an additional week.  6. Hypothyroidism: On levothyroxine 150 mcg daily. TSH 0.014 on 03/08/19. I will reduce dose to 75 mcg daily and repeat TSH in 6 weeks (as she was unable to see PCP).   COVID-19 Education: The signs and symptoms of COVID-19 were discussed with the patient and how to seek care for testing (follow up with PCP or arrange E-visit).  The importance of social distancing was discussed today.  Time:   Today, I have spent 40 minutes with the patient with telehealth technology discussing the above problems.     Medication Adjustments/Labs and Tests Ordered: Current medicines are reviewed at length with the patient today.  Concerns regarding medicines are outlined above.   Tests Ordered: No orders of the defined types were placed in this encounter.   Medication Changes: No orders of the defined types were placed in this encounter.   Disposition:  Follow up in 3 month(s)  Signed, Prentice Docker, MD  03/14/2019 8:36 AM    [facility]

## 2019-03-14 NOTE — Addendum Note (Signed)
Addended by: Lesle Chris on: 03/14/2019 09:59 AM   Modules accepted: Orders

## 2019-03-20 ENCOUNTER — Ambulatory Visit (INDEPENDENT_AMBULATORY_CARE_PROVIDER_SITE_OTHER): Payer: BLUE CROSS/BLUE SHIELD

## 2019-03-20 ENCOUNTER — Other Ambulatory Visit: Payer: Self-pay

## 2019-03-20 DIAGNOSIS — I255 Ischemic cardiomyopathy: Secondary | ICD-10-CM

## 2019-03-25 ENCOUNTER — Other Ambulatory Visit: Payer: Self-pay | Admitting: Adult Health

## 2019-03-26 ENCOUNTER — Telehealth: Payer: Self-pay | Admitting: *Deleted

## 2019-03-26 ENCOUNTER — Encounter: Payer: Self-pay | Admitting: *Deleted

## 2019-03-26 ENCOUNTER — Telehealth: Payer: Self-pay | Admitting: Cardiovascular Disease

## 2019-03-26 DIAGNOSIS — R931 Abnormal findings on diagnostic imaging of heart and coronary circulation: Secondary | ICD-10-CM

## 2019-03-26 MED ORDER — CARVEDILOL 3.125 MG PO TABS: 3.1250 mg | ORAL_TABLET | Freq: Two times a day (BID) | ORAL | 6 refills | Status: DC

## 2019-03-26 NOTE — Telephone Encounter (Signed)
Can write work excuse note for 2 weeks, she should contact us in about a 1 week and a half to update [place] on how she is feeling    Dominga Ferry MD

## 2019-03-26 NOTE — Telephone Encounter (Signed)
Patient also would like to know when she can return to work.  She thought that Dr. Purvis Sheffield wanted to see the echo results before deciding.  Stated that she works 12 hour shifts at [employer] and is still feeling really tired, so uneasy about going back.  Message fwd to provider.

## 2019-03-26 NOTE — Telephone Encounter (Signed)
Notes recorded by Lesle Chris, LPN on 2/87/6811 at 10:06 AM EDT Patient notified. Copy to pmd. Follow up scheduled for 06/24/2019. Will send new prescription to [facility] now. Suggested she purchase bp monitor if able to do do. She verbalized understanding.   ------  Notes recorded by Laqueta Linden, MD on 03/20/2019 at 12:44 PM EDT Cardiac function is significantly reduced. Would recommend starting Coreg 3.125 mg bid. Will need to monitor BP and HR. Recommend follow up limited echo in 3 months to assess for interval improvement.

## 2019-03-26 NOTE — Telephone Encounter (Signed)
°  Precert needed for: Limited Echo   Location:   [facility]  Date:  Jun 18, 2019

## 2019-03-26 NOTE — Telephone Encounter (Signed)
Patient notified.  Would like for [place] to notify Lynden Ang (nurse) at [employer].    Phone:  [phone] / Fax:  [phone]  2 weeks from today will be June 3 - can return on April 10, 2019.    Cathy from [place] notified & will fax note as requested.

## 2019-04-16 ENCOUNTER — Telehealth: Payer: Self-pay | Admitting: Cardiovascular Disease

## 2019-04-16 NOTE — Telephone Encounter (Signed)
smartphone/ consent/ my chart via text/ pre reg completed  °

## 2019-04-22 ENCOUNTER — Encounter: Payer: Self-pay | Admitting: Cardiovascular Disease

## 2019-04-22 ENCOUNTER — Telehealth (INDEPENDENT_AMBULATORY_CARE_PROVIDER_SITE_OTHER): Payer: BC Managed Care – PPO | Admitting: Cardiovascular Disease

## 2019-04-22 VITALS — Ht 62.0 in

## 2019-04-22 DIAGNOSIS — I739 Peripheral vascular disease, unspecified: Secondary | ICD-10-CM | POA: Diagnosis not present

## 2019-04-22 DIAGNOSIS — I2510 Atherosclerotic heart disease of native coronary artery without angina pectoris: Secondary | ICD-10-CM

## 2019-04-22 MED ORDER — ROSUVASTATIN CALCIUM 40 MG PO TABS: 40.0000 mg | ORAL_TABLET | Freq: Every day | ORAL | 3 refills | Status: DC

## 2019-04-22 NOTE — Progress Notes (Signed)
Virtual Visit via Telephone Note   This visit type was conducted due to national recommendations for restrictions regarding the COVID-19 Pandemic (e.g. social distancing) in an effort to limit this patient's exposure and mitigate transmission in our community.  Due to her co-morbid illnesses, this patient is at least at moderate risk for complications without adequate follow up.  This format is felt to be most appropriate for this patient at this time.  The patient did not have access to video technology/had technical difficulties with video requiring transitioning to audio format only (telephone).  All issues noted in this document were discussed and addressed.  No physical exam could be performed with this format.  Please refer to the patient's chart for her  consent to telehealth for [facility].   Date:  04/22/2019   ID:  Whitney May, DOB 02/07/1961, MRN [id number]  Patient Location: Home Provider Location: Office  PCP:  Neale Burly, MD  Cardiologist:  Kate Sable, MD  Electrophysiologist:  None   Evaluation Performed:  Follow-Up Visit  Chief Complaint: Bilateral ankle swelling.  History of Present Illness:    Whitney May is a 58 y.o. female was reached via phone for follow-up visit regarding peripheral arterial disease. She has known history of coronary artery disease with myocardial infarction in September, 2019.  Catheterization showed occluded RCA which was treated with drug-eluting stent placement.  Ejection fraction was 35 to 40% with inferior and inferolateral akinesis.  She has chronic medical conditions that include hyperlipidemia, tobacco and excessive alcohol use. She has been followed for moderate left calf claudication due to long occlusion of the left SFA.  She has been treated medically. She was prescribed Pletal but she is having significant dizziness with it. She presented in May of this year with non-ST elevation myocardial infarction was found to  have severe in-stent restenosis in the right coronary artery with plaque rupture.  This was treated with PCI and 2 overlapping drug-eluting stents.  She has been doing reasonably well with improvement in left calf claudication.  She has occasional chest pain that has improved since most recent stenting.  She is bothered mostly by swelling in her right ankle.  She is not sure if she had trauma there.  The patient does not have symptoms concerning for COVID-19 infection (fever, chills, cough, or new shortness of breath).    Past Medical History:  Diagnosis Date  . Alcohol use   . Chest pain, unspecified   . Hyperlipidemia, mixed   . Hypothyroidism   . Tobacco abuse    Past Surgical History:  Procedure Laterality Date  . CESAREAN SECTION    . CORONARY STENT INTERVENTION N/A 03/07/2019   Procedure: CORONARY STENT INTERVENTION;  Surgeon: Sherren Mocha, MD;  Location: [place] [facility];  Service: Cardiovascular;  Laterality: N/A;  . CORONARY/GRAFT ACUTE MI REVASCULARIZATION N/A 08/02/2018   Procedure: Coronary/Graft Acute MI Revascularization;  Surgeon: Leonie Man, MD;  Location: [place] [facility];  Service: Cardiovascular;  Laterality: N/A;  . LEFT HEART CATH AND CORONARY ANGIOGRAPHY N/A 08/02/2018   Procedure: LEFT HEART CATH AND CORONARY ANGIOGRAPHY;  Surgeon: Leonie Man, MD;  Location: [place] [facility];  Service: Cardiovascular;  Laterality: N/A;  . LEFT HEART CATH AND CORONARY ANGIOGRAPHY N/A 03/07/2019   Procedure: LEFT HEART CATH AND CORONARY ANGIOGRAPHY;  Surgeon: Jolaine Artist, MD;  Location: [place] [facility];  Service: Cardiovascular;  Laterality: N/A;     Current Meds  Medication Sig  .  aspirin 81 MG chewable tablet Chew 1 tablet (81 mg total) by mouth daily.  . carvedilol (COREG) 3.125 MG tablet Take 1 tablet (3.125 mg total) by mouth 2 (two) times daily.  . clopidogrel (PLAVIX) 75 MG tablet Take 1 tablet (75 mg total) by mouth daily.  Marland Kitchen. levothyroxine  (SYNTHROID) 75 MCG tablet Take 1 tablet (75 mcg total) by mouth daily.  . Multiple Vitamin (MULTIVITAMIN WITH MINERALS) TABS tablet Take 1 tablet by mouth daily.  . nitroGLYCERIN (NITROSTAT) 0.4 MG SL tablet Place 1 tablet (0.4 mg total) under the tongue every 5 (five) minutes x 3 doses as needed for chest pain.  . rosuvastatin (CRESTOR) 40 MG tablet TAKE 1 TABLET BY MOUTH  DAILY     Allergies:   Penicillins   Social History   Tobacco Use  . Smoking status: Former Smoker    Packs/day: 1.00    Years: 22.00    Pack years: 22.00    Types: Cigarettes    Quit date: 08/09/2018    Years since quitting: 0.7  . Smokeless tobacco: Never Used  Substance Use Topics  . Alcohol use: Yes    Alcohol/week: 8.0 standard drinks    Types: 8 Glasses of wine per week  . Drug use: No     Family Hx: The patient's family history includes Coronary artery disease in her maternal grandmother and mother; Diabetes in her father; Hypertension in her father.  ROS:   Please see the history of present illness.     All other systems reviewed and are negative.   Prior CV studies:   The following studies were reviewed today:    Labs/Other Tests and Data Reviewed:    EKG:  No ECG reviewed.  Recent Labs: 08/02/2018: ALT 16 03/08/2019: BUN 6; Creatinine, Ser 0.69; Hemoglobin 11.5; Platelets 199; Potassium 3.8; Sodium 138; TSH 0.014   Recent Lipid Panel Lab Results  Component Value Date/Time   CHOL 87 03/08/2019 01:41 AM   TRIG 103 03/08/2019 01:41 AM   HDL 27 (L) 03/08/2019 01:41 AM   CHOLHDL 3.2 03/08/2019 01:41 AM   LDLCALC 39 03/08/2019 01:41 AM    Wt Readings from Last 3 Encounters:  03/08/19 132 lb 6.4 oz (60.1 kg)  10/08/18 126 lb 3.2 oz (57.2 kg)  10/07/18 130 lb (59 kg)     Objective:    Vital Signs:  Ht 5\' 2"  (1.575 m)   BMI 24.22 kg/m    VITAL SIGNS:  reviewed  ASSESSMENT & PLAN:    1.  Peripheral arterial disease: Moderate left calf claudication due to long occlusion of the  left SFA with moderately reduced ABI.  She reports improvement in left calf claudication which does not seem to be lifestyle limiting at the present time.  Continue medical therapy and risk factor modification.  2.  Coronary artery disease involving native coronary arteries:  She had myocardial infarction in May with stenting of the RCA due to in-stent restenosis.  Recommend indefinite dual antiplatelet therapy.  3.  Tobacco use:  Unfortunately, she continues to smoke.  I discussed with her the importance of smoking cessation.  4.  Hyperlipidemia: I agree with high-dose rosuvastatin with a target LDL of less than 70.  5.  Ankle swelling especially on the right side: I explained to her that this is unlikely to be related to peripheral arterial disease.  She describes what seems to be bruising on the right ankle.  I advised her to see her primary care physician to ensure  no fractures or other injuries.  COVID-19 Education: The signs and symptoms of COVID-19 were discussed with the patient and how to seek care for testing (follow up with PCP or arrange E-visit).  The importance of social distancing was discussed today.  Time:   Today, I have spent 9 minutes with the patient with telehealth technology discussing the above problems.     Medication Adjustments/Labs and Tests Ordered: Current medicines are reviewed at length with the patient today.  Concerns regarding medicines are outlined above.   Tests Ordered: No orders of the defined types were placed in this encounter.   Medication Changes: No orders of the defined types were placed in this encounter.   Follow Up:  In Person in 3 month(s)  Signed, Lorine Bears, MD  04/22/2019 9:40 AM    [facility]

## 2019-04-22 NOTE — Patient Instructions (Signed)
Medication Instructions:  Continue same medications If you need a refill on your cardiac medications before your next appointment, please call your pharmacy.   Lab work: None If you have labs (blood work) drawn today and your tests are completely normal, you will receive your results only by: Marland Kitchen MyChart Message (if you have MyChart) OR . A paper copy in the mail If you have any lab test that is abnormal or we need to change your treatment, we will call you to review the results.  Testing/Procedures: None  Follow-Up: Office follow-up with Dr. Fletcher Anon in 3 months.

## 2019-06-04 DIAGNOSIS — Z1231 Encounter for screening mammogram for malignant neoplasm of breast: Secondary | ICD-10-CM | POA: Diagnosis not present

## 2019-06-16 DIAGNOSIS — M79671 Pain in right foot: Secondary | ICD-10-CM | POA: Diagnosis not present

## 2019-06-16 DIAGNOSIS — M722 Plantar fascial fibromatosis: Secondary | ICD-10-CM | POA: Diagnosis not present

## 2019-06-18 ENCOUNTER — Ambulatory Visit (HOSPITAL_COMMUNITY): Payer: BC Managed Care – PPO | Attending: Cardiovascular Disease

## 2019-06-24 ENCOUNTER — Ambulatory Visit: Payer: BLUE CROSS/BLUE SHIELD | Admitting: Cardiovascular Disease

## 2019-06-25 ENCOUNTER — Encounter: Payer: Self-pay | Admitting: Cardiovascular Disease

## 2019-07-29 ENCOUNTER — Ambulatory Visit: Payer: BC Managed Care – PPO | Admitting: Cardiovascular Disease

## 2019-10-06 ENCOUNTER — Other Ambulatory Visit: Payer: Self-pay

## 2019-10-22 DIAGNOSIS — I5023 Acute on chronic systolic (congestive) heart failure: Secondary | ICD-10-CM | POA: Diagnosis not present

## 2019-10-22 DIAGNOSIS — E038 Other specified hypothyroidism: Secondary | ICD-10-CM | POA: Diagnosis not present

## 2019-10-22 DIAGNOSIS — E119 Type 2 diabetes mellitus without complications: Secondary | ICD-10-CM | POA: Diagnosis not present

## 2019-10-22 DIAGNOSIS — J449 Chronic obstructive pulmonary disease, unspecified: Secondary | ICD-10-CM | POA: Diagnosis not present

## 2019-10-22 DIAGNOSIS — R5383 Other fatigue: Secondary | ICD-10-CM | POA: Diagnosis not present

## 2020-02-14 ENCOUNTER — Other Ambulatory Visit: Payer: Self-pay | Admitting: Cardiovascular Disease

## 2020-02-16 NOTE — Telephone Encounter (Signed)
Please schedule overdue 3 month F/U with Dr. Kirke Corin for refills. Patient did not show for last scheduled appointment. Thank you!

## 2020-02-16 NOTE — Telephone Encounter (Signed)
Attempted to schedule.  LMOV to call office.  ° °

## 2020-03-01 DIAGNOSIS — I5023 Acute on chronic systolic (congestive) heart failure: Secondary | ICD-10-CM | POA: Diagnosis not present

## 2020-03-01 DIAGNOSIS — E785 Hyperlipidemia, unspecified: Secondary | ICD-10-CM | POA: Diagnosis not present

## 2020-03-01 DIAGNOSIS — E038 Other specified hypothyroidism: Secondary | ICD-10-CM | POA: Diagnosis not present

## 2020-03-01 DIAGNOSIS — J449 Chronic obstructive pulmonary disease, unspecified: Secondary | ICD-10-CM | POA: Diagnosis not present

## 2020-03-03 NOTE — Telephone Encounter (Signed)
Attempted to schedule.  LMOV to call office.  ° °

## 2020-03-04 ENCOUNTER — Other Ambulatory Visit: Payer: Self-pay | Admitting: Cardiovascular Disease

## 2020-03-08 NOTE — Telephone Encounter (Signed)
Attempted to schedule call dropped   3 attempts to schedule fu appt from recall list.   Deleting recall.

## 2020-03-15 ENCOUNTER — Other Ambulatory Visit: Payer: Self-pay | Admitting: Cardiovascular Disease

## 2020-03-16 NOTE — Telephone Encounter (Signed)
Please review for refill. Thanks!  

## 2020-04-22 ENCOUNTER — Other Ambulatory Visit: Payer: Self-pay | Admitting: Cardiovascular Disease

## 2020-07-06 DIAGNOSIS — E785 Hyperlipidemia, unspecified: Secondary | ICD-10-CM | POA: Diagnosis not present

## 2020-07-06 DIAGNOSIS — Z0000 Encounter for general adult medical examination without abnormal findings: Secondary | ICD-10-CM | POA: Diagnosis not present

## 2020-07-06 DIAGNOSIS — Z6821 Body mass index (BMI) 21.0-21.9, adult: Secondary | ICD-10-CM | POA: Diagnosis not present

## 2020-07-06 DIAGNOSIS — R7989 Other specified abnormal findings of blood chemistry: Secondary | ICD-10-CM | POA: Diagnosis not present

## 2020-09-13 DIAGNOSIS — H823 Vertiginous syndromes in diseases classified elsewhere, bilateral: Secondary | ICD-10-CM | POA: Diagnosis not present

## 2020-09-13 DIAGNOSIS — Z6820 Body mass index (BMI) 20.0-20.9, adult: Secondary | ICD-10-CM | POA: Diagnosis not present

## 2020-12-17 DIAGNOSIS — Z20822 Contact with and (suspected) exposure to covid-19: Secondary | ICD-10-CM | POA: Diagnosis not present

## 2020-12-17 DIAGNOSIS — U071 COVID-19: Secondary | ICD-10-CM | POA: Diagnosis not present

## 2020-12-29 DIAGNOSIS — U071 COVID-19: Secondary | ICD-10-CM | POA: Diagnosis not present

## 2020-12-29 DIAGNOSIS — Z6821 Body mass index (BMI) 21.0-21.9, adult: Secondary | ICD-10-CM | POA: Diagnosis not present

## 2021-01-04 DIAGNOSIS — K529 Noninfective gastroenteritis and colitis, unspecified: Secondary | ICD-10-CM | POA: Diagnosis not present

## 2021-01-04 DIAGNOSIS — Z6820 Body mass index (BMI) 20.0-20.9, adult: Secondary | ICD-10-CM | POA: Diagnosis not present

## 2021-05-31 DIAGNOSIS — F418 Other specified anxiety disorders: Secondary | ICD-10-CM | POA: Diagnosis not present

## 2021-05-31 DIAGNOSIS — E079 Disorder of thyroid, unspecified: Secondary | ICD-10-CM | POA: Diagnosis not present

## 2021-05-31 DIAGNOSIS — I519 Heart disease, unspecified: Secondary | ICD-10-CM | POA: Diagnosis not present

## 2021-05-31 DIAGNOSIS — Z7689 Persons encountering health services in other specified circumstances: Secondary | ICD-10-CM | POA: Diagnosis not present

## 2021-05-31 DIAGNOSIS — E785 Hyperlipidemia, unspecified: Secondary | ICD-10-CM | POA: Diagnosis not present

## 2021-05-31 DIAGNOSIS — Z0000 Encounter for general adult medical examination without abnormal findings: Secondary | ICD-10-CM | POA: Diagnosis not present

## 2021-06-02 ENCOUNTER — Encounter: Payer: Self-pay | Admitting: Internal Medicine

## 2021-06-17 DIAGNOSIS — F4321 Adjustment disorder with depressed mood: Secondary | ICD-10-CM | POA: Diagnosis not present

## 2021-06-30 DIAGNOSIS — Z1231 Encounter for screening mammogram for malignant neoplasm of breast: Secondary | ICD-10-CM | POA: Diagnosis not present

## 2021-08-22 DIAGNOSIS — E559 Vitamin D deficiency, unspecified: Secondary | ICD-10-CM | POA: Diagnosis not present

## 2021-08-22 DIAGNOSIS — E079 Disorder of thyroid, unspecified: Secondary | ICD-10-CM | POA: Diagnosis not present

## 2021-08-22 DIAGNOSIS — F418 Other specified anxiety disorders: Secondary | ICD-10-CM | POA: Diagnosis not present

## 2021-08-22 DIAGNOSIS — E785 Hyperlipidemia, unspecified: Secondary | ICD-10-CM | POA: Diagnosis not present

## 2021-08-22 DIAGNOSIS — M722 Plantar fascial fibromatosis: Secondary | ICD-10-CM | POA: Diagnosis not present

## 2021-09-19 ENCOUNTER — Encounter: Payer: Self-pay | Admitting: Internal Medicine

## 2021-09-26 DIAGNOSIS — F17200 Nicotine dependence, unspecified, uncomplicated: Secondary | ICD-10-CM | POA: Diagnosis not present

## 2021-09-26 DIAGNOSIS — F418 Other specified anxiety disorders: Secondary | ICD-10-CM | POA: Diagnosis not present

## 2021-09-26 DIAGNOSIS — Z5320 Procedure and treatment not carried out because of patient's decision for unspecified reasons: Secondary | ICD-10-CM | POA: Diagnosis not present

## 2021-09-26 DIAGNOSIS — D72829 Elevated white blood cell count, unspecified: Secondary | ICD-10-CM | POA: Diagnosis not present

## 2021-09-26 DIAGNOSIS — R229 Localized swelling, mass and lump, unspecified: Secondary | ICD-10-CM | POA: Diagnosis not present

## 2021-10-05 ENCOUNTER — Ambulatory Visit: Payer: BC Managed Care – PPO | Admitting: Gastroenterology

## 2022-01-30 ENCOUNTER — Ambulatory Visit: Payer: BC Managed Care – PPO | Admitting: Gastroenterology

## 2022-02-14 NOTE — Progress Notes (Deleted)
? ?Referring Provider:  Shelby Dubin, FNP ?Primary Care Physician:  Shelby Dubin, FNP ?Primary Gastroenterologist:  Dr. Marletta Lor ? ?No chief complaint on file. ? ? ?HPI:   ?Whitney May is a 61 y.o. female presenting today at the request of  Bucio, Julian Reil, FNP for consult colonoscopy.  ? ?She has history significant for CAD with MI in September 2019 s/p DES placement, NSTEMI in May 2020 severe in-stent restenosis treated with 2 overlapping DES's, EF 30 to 35% in May 2020, chronic occlusion of left FSA with claudication, HLD, hypothyroidism.  ? ?Hasn't seen cardiology since 2020.  ? ?Today:  ? ? ?Past Medical History:  ?Diagnosis Date  ? Alcohol use   ? Chest pain, unspecified   ? Hyperlipidemia, mixed   ? Hypothyroidism   ? Tobacco abuse   ? ? ?Past Surgical History:  ?Procedure Laterality Date  ? CESAREAN SECTION    ? CORONARY STENT INTERVENTION N/A 03/07/2019  ? Procedure: CORONARY STENT INTERVENTION;  Surgeon: Tonny Bollman, MD;  Location: [facility];  Service: Cardiovascular;  Laterality: N/A;  ? CORONARY/GRAFT ACUTE MI REVASCULARIZATION N/A 08/02/2018  ? Procedure: Coronary/Graft Acute MI Revascularization;  Surgeon: Marykay Lex, MD;  Location: [facility] IN[facility]ASIVE [facility];  Service: Cardiovascular;  Laterality: N/A;  ? LEFT HEART CATH AND CORONARY ANGIOGRAPHY N/A 08/02/2018  ? Procedure: LEFT HEART CATH AND CORONARY ANGIOGRAPHY;  Surgeon: Marykay Lex, MD;  Location: [facility] INVASIVE CV LAB;  Service: Cardiovascular;  Laterality: N/A;  ? LEFT HEART CATH AND CORONARY ANGIOGRAPHY N/A 03/07/2019  ? Procedure: LEFT HEART CATH AND CORONARY ANGIOGRAPHY;  Surgeon: Dolores Patty, MD;  Location: [facility] INVASIVE [facility] LAB;  Service: Cardiovascular;  Laterality: N/A;  ? ? ?Current Outpatient Medications  ?Medication Sig Dispense Refill  ? aspirin 81 MG chewable tablet Chew 1 tablet (81 mg total) by mouth daily.    ? carvedilol (COREG) 3.125 MG tablet Take 1 tablet by mouth twice daily 30 tablet 0  ? clopidogrel (PLAVIX)  75 MG tablet Take 1 tablet (75 mg total) by mouth daily. 90 tablet 3  ? levothyroxine (SYNTHROID) 75 MCG tablet Take 1 tablet (75 mcg total) by mouth daily. 30 tablet 6  ? Multiple Vitamin (MULTIVITAMIN WITH MINERALS) TABS tablet Take 1 tablet by mouth daily. 30 tablet 0  ? nitroGLYCERIN (NITROSTAT) 0.4 MG SL tablet Place 1 tablet (0.4 mg total) under the tongue every 5 (five) minutes x 3 doses as needed for chest pain. 25 tablet 1  ? rosuvastatin (CRESTOR) 40 MG tablet Take 1 tablet (40 mg total) by mouth daily. 30 tablet 0  ? ?No current facility-administered medications for this visit.  ? ? ?Allergies as of 02/16/2022 - Review Complete 04/22/2019  ?Allergen Reaction Noted  ? Penicillins Other (See Comments) 09/24/2015  ? ? ?Family History  ?Problem Relation Age of Onset  ? Coronary artery disease Mother   ? Coronary artery disease Maternal Grandmother   ? Hypertension Father   ? Diabetes Father   ? ? ?Social History  ? ?Socioeconomic History  ? Marital status: Married  ?  Spouse name: Not on file  ? Number of children: Not on file  ? Years of education: Not on file  ? Highest education level: Not on file  ?Occupational History  ? Not on file  ?Tobacco Use  ? Smoking status: Former  ?  Packs/day: 1.00  ?  Years: 22.00  ?  Pack years: 22.00  ?  Types: Cigarettes  ?  Quit date: 08/09/2018  ?  Years since quitting: 3.5  ? Smokeless tobacco: Never  ?Substance and Sexual Activity  ? Alcohol use: Yes  ?  Alcohol/week: 8.0 standard drinks  ?  Types: 8 Glasses of wine per week  ? Drug use: No  ? Sexual activity: Not on file  ?Other Topics Concern  ? Not on file  ?Social History Narrative  ? Married  ? Gets regular exercise  ? ?Social Determinants of Health  ? ?Financial Resource Strain: Not on file  ?Food Insecurity: Not on file  ?Transportation Needs: Not on file  ?Physical Activity: Not on file  ?Stress: Not on file  ?Social Connections: Not on file  ?Intimate Partner Violence: Not on file  ? ? ?Review of Systems: ?Gen:  Denies any fever, chills, cold or flulike symptoms, presyncope, syncope. ?CV: Denies chest pain, heart palpitations. ?Resp: Denies shortness of breath or cough. ?GI: See HPI ?GU : Denies urinary burning, urinary frequency, urinary hesitancy ?MS: Denies joint pain. ?Derm: Denies rash. ?Psych: Denies depression, anxiety. ?Heme: See HPI ? ?Physical Exam: ?There were no vitals taken for this visit. ?General:   Alert and oriented. Pleasant and cooperative. Well-nourished and well-developed.  ?Head:  Normocephalic and atraumatic. ?Eyes:  Without icterus, sclera clear and conjunctiva pink.  ?Ears:  Normal auditory acuity. ?Lungs:  Clear to auscultation bilaterally. No wheezes, rales, or rhonchi. No distress.  ?Heart:  S1, S2 present without murmurs appreciated.  ?Abdomen:  +BS, soft, non-tender and non-distended. No HSM noted. No guarding or rebound. No masses appreciated.  ?Rectal:  Deferred  ?Msk:  Symmetrical without gross deformities. Normal posture. ?Extremities:  Without edema. ?Neurologic:  Alert and  oriented x4;  grossly normal neurologically. ?Skin:  Intact without significant lesions or rashes. ?Psych:  Normal mood and affect. ? ? ? ?Assessment:  ? ? ? ?Plan:  ?*** ? ? ?Ermalinda Memos, PA-C ?[facility] ?02/16/2022 ?  ?

## 2022-02-16 ENCOUNTER — Ambulatory Visit: Payer: Self-pay | Admitting: Gastroenterology

## 2022-03-20 ENCOUNTER — Encounter: Payer: Self-pay | Admitting: Physician Assistant

## 2022-03-20 NOTE — Progress Notes (Deleted)
Cardiology Office Note    Date:  03/20/2022   ID:  Whitney May, DOB Jul 06, 1961, MRN [id number]  PCP:  Olga Coaster, FNP  Cardiologist:  Kate Sable, MD (Inactive)  Electrophysiologist:  None   Chief Complaint: ***  History of Present Illness:   Whitney May is a 61 y.o. female with history of CAD with MI 07/2018 s/p DES to occluded RCA, NSTEMI 03/2019 s/p overlapping DESx2 to RCA, ICM, LE PAD, alcohol use, hyperlipidemia, hypothyroidism, tobacco abuse who is seen for overdue follow-up  She was previously admitted 07/2018 with MI and underwent DES to 100% occluded RCa. EF was 35-40% at time of MI, with repeat in 09/2018 showing normal EF. She was on Brilinta but switched to Plavix. She presented back to the hospital 03/2019 with recurrent chest pain and underwent cath as outlined below with PCI with overlapping DES to RCA for severe ISR. It was recommended to continue DAPT for at least 12 months, possibly indefinitely. Her echo at that time showed EF 30-35%, impaired relaxation, hypokinesis of anteroseptal, inferoseptal, inferior walls. She had also followed with Dr. Fletcher Anon separately for PAD with long occlusion of the SFA, managed medically. She has not been seen here since tele-med visit in 04/2019.  CAD with Hyperlipidemia goal LDL <70 Ischemic cardiomyopathy/chronic HFrEF Tobacco/ETOH use PAD    Labwork independently reviewed: 05/2019 AST 25, ALT 34, Cr 0.78, K 4.2, Na 138, Hgb 13.6, plt 240, TSH 19, free T4 wnl 03/2019 LDL 39   Cardiology Studies:   Studies reviewed are outlined and summarized above. Reports included below if pertinent.   2d echo 03/2019   1. The left ventricle has moderate-severely reduced systolic function,  with an ejection fraction of 30-35%. The cavity size was normal. Left  ventricular diastolic Doppler parameters are consistent with impaired  relaxation.   2. Anteroseptal, inferoseptal, inferior walls are hypokinetic.   3. The aortic valve is  tricuspid. No stenosis of the aortic valve.   4. The aortic root is normal in size and structure.   5. Pulmonary hypertension is indeterminant, inadequate TR jet.   6. The interatrial septum was not well visualized.   Last Cath 03/2019 - PCI Successful PCI of the RCA with overlapping DES (3.5x38 and 3.5x12 mm Resolute Onyx) for treatment of severe in-stent restenosis in patient with ACS   Recommend: ASA/Clopidogrel at least 12 months, consider indefinite. Tobacco cessation and secondary risk reduction efforts.   Diagnostic cath 03/2019 Successful PCI of the RCA with overlapping DES (3.5x38 and 3.5x12 mm Resolute Onyx) for treatment of severe in-stent restenosis in patient with ACS   Recommend: ASA/Clopidogrel at least 12 months, consider indefinite. Tobacco cessation and secondary risk reduction efforts.     Past Medical History:  Diagnosis Date   Alcohol use    Chest pain, unspecified    Hyperlipidemia, mixed    Hypothyroidism    Tobacco abuse     Past Surgical History:  Procedure Laterality Date   CESAREAN SECTION     CORONARY STENT INTERVENTION N/A 03/07/2019   Procedure: CORONARY STENT INTERVENTION;  Surgeon: Sherren Mocha, MD;  Location: [place] [facility];  Service: Cardiovascular;  Laterality: N/A;   CORONARY/GRAFT ACUTE MI REVASCULARIZATION N/A 08/02/2018   Procedure: Coronary/Graft Acute MI Revascularization;  Surgeon: Leonie Man, MD;  Location: [employer] [facility];  Service: Cardiovascular;  Laterality: N/A;   LEFT HEART CATH AND CORONARY ANGIOGRAPHY N/A 08/02/2018   Procedure: LEFT HEART CATH AND CORONARY ANGIOGRAPHY;  Surgeon:  Leonie Man, MD;  Location: [place] [facility];  Service: Cardiovascular;  Laterality: N/A;   LEFT HEART CATH AND CORONARY ANGIOGRAPHY N/A 03/07/2019   Procedure: LEFT HEART CATH AND CORONARY ANGIOGRAPHY;  Surgeon: Jolaine Artist, MD;  Location: [place] [facility];  Service: Cardiovascular;  Laterality: N/A;    Current Medications: No  outpatient medications have been marked as taking for the 03/21/22 encounter (Appointment) with Charlie Pitter, PA-C.   ***   Allergies:   Penicillins   Social History   Socioeconomic History   Marital status: Married    Spouse name: Not on file   Number of children: Not on file   Years of education: Not on file   Highest education level: Not on file  Occupational History   Not on file  Tobacco Use   Smoking status: Former    Packs/day: 1.00    Years: 22.00    Pack years: 22.00    Types: Cigarettes    Quit date: 08/09/2018    Years since quitting: 3.6   Smokeless tobacco: Never  Substance and Sexual Activity   Alcohol use: Yes    Alcohol/week: 8.0 standard drinks    Types: 8 Glasses of wine per week   Drug use: No   Sexual activity: Not on file  Other Topics Concern   Not on file  Social History Narrative   Married   Gets regular exercise   Social Determinants of Health   Financial Resource Strain: Not on file  Food Insecurity: Not on file  Transportation Needs: Not on file  Physical Activity: Not on file  Stress: Not on file  Social Connections: Not on file     Family History:  The patient's ***family history includes Coronary artery disease in her maternal grandmother and mother; Diabetes in her father; Hypertension in her father.  ROS:   Please see the history of present illness. Otherwise, review of systems is positive for ***.  All other systems are reviewed and otherwise negative.    EKG(s)/Additional Labs   EKG:  EKG is ordered today, personally reviewed, demonstrating ***  Recent Labs: No results found for requested labs within last 8760 hours.  Recent Lipid Panel    Component Value Date/Time   CHOL 87 03/08/2019 0141   TRIG 103 03/08/2019 0141   HDL 27 (L) 03/08/2019 0141   CHOLHDL 3.2 03/08/2019 0141   VLDL 21 03/08/2019 0141   LDLCALC 39 03/08/2019 0141    PHYSICAL EXAM:    VS:  There were no vitals taken for this visit.  BMI: There is  no height or weight on file to calculate BMI.  GEN: Well nourished, well developed female in no acute distress HEENT: normocephalic, atraumatic Neck: no JVD, carotid bruits, or masses Cardiac: ***RRR; no murmurs, rubs, or gallops, no edema  Respiratory:  clear to auscultation bilaterally, normal work of breathing GI: soft, nontender, nondistended, + BS MS: no deformity or atrophy Skin: warm and dry, no rash Neuro:  Alert and Oriented x 3, Strength and sensation are intact, follows commands Psych: euthymic mood, full affect  Wt Readings from Last 3 Encounters:  03/08/19 132 lb 6.4 oz (60.1 kg)  10/08/18 126 lb 3.2 oz (57.2 kg)  10/07/18 130 lb (59 kg)     ASSESSMENT & PLAN:   ***     Disposition: F/u with ***   Medication Adjustments/Labs and Tests Ordered: Current medicines are reviewed at length with the patient today.  Concerns regarding medicines  are outlined above. Medication changes, Labs and Tests ordered today are summarized above and listed in the Patient Instructions accessible in Encounters.    Signed, Charlie Pitter, PA-C  03/20/2022 10:59 AM    [facility] Location in [place], [place] [address] Ph: [phone]; Fax [phone]

## 2022-03-21 ENCOUNTER — Ambulatory Visit: Payer: Self-pay | Admitting: Physician Assistant

## 2022-03-21 DIAGNOSIS — I5022 Chronic systolic (congestive) heart failure: Secondary | ICD-10-CM

## 2022-03-21 DIAGNOSIS — I2510 Atherosclerotic heart disease of native coronary artery without angina pectoris: Secondary | ICD-10-CM

## 2022-03-21 DIAGNOSIS — Z720 Tobacco use: Secondary | ICD-10-CM

## 2022-03-21 DIAGNOSIS — Z789 Other specified health status: Secondary | ICD-10-CM

## 2022-03-21 DIAGNOSIS — E785 Hyperlipidemia, unspecified: Secondary | ICD-10-CM

## 2022-03-21 DIAGNOSIS — I739 Peripheral vascular disease, unspecified: Secondary | ICD-10-CM

## 2022-03-22 ENCOUNTER — Encounter: Payer: Self-pay | Admitting: Physician Assistant

## 2022-09-18 ENCOUNTER — Encounter (HOSPITAL_COMMUNITY)
Admission: EM | Disposition: A | Discharge status: Home / Self Care | Payer: Self-pay | Source: Ambulatory Visit | Attending: Cardiovascular Disease

## 2022-09-18 ENCOUNTER — Inpatient Hospital Stay (HOSPITAL_COMMUNITY): Payer: Self-pay

## 2022-09-18 ENCOUNTER — Inpatient Hospital Stay (HOSPITAL_COMMUNITY)
Admission: EM | Admit: 2022-09-18 | Discharge: 2022-09-20 | DRG: 322 | Disposition: A | Discharge status: Home / Self Care | Payer: Self-pay | Source: Ambulatory Visit | Attending: Cardiovascular Disease | Admitting: Cardiovascular Disease

## 2022-09-18 ENCOUNTER — Other Ambulatory Visit (HOSPITAL_COMMUNITY): Payer: Self-pay

## 2022-09-18 ENCOUNTER — Encounter (HOSPITAL_COMMUNITY): Payer: Self-pay | Admitting: Cardiovascular Disease

## 2022-09-18 DIAGNOSIS — E782 Mixed hyperlipidemia: Secondary | ICD-10-CM | POA: Diagnosis present

## 2022-09-18 DIAGNOSIS — I2111 ST elevation (STEMI) myocardial infarction involving right coronary artery: Principal | ICD-10-CM | POA: Diagnosis present

## 2022-09-18 DIAGNOSIS — I2510 Atherosclerotic heart disease of native coronary artery without angina pectoris: Secondary | ICD-10-CM | POA: Diagnosis present

## 2022-09-18 DIAGNOSIS — I739 Peripheral vascular disease, unspecified: Secondary | ICD-10-CM | POA: Diagnosis present

## 2022-09-18 DIAGNOSIS — F17210 Nicotine dependence, cigarettes, uncomplicated: Secondary | ICD-10-CM | POA: Diagnosis present

## 2022-09-18 DIAGNOSIS — Z955 Presence of coronary angioplasty implant and graft: Secondary | ICD-10-CM

## 2022-09-18 DIAGNOSIS — I255 Ischemic cardiomyopathy: Secondary | ICD-10-CM | POA: Diagnosis present

## 2022-09-18 DIAGNOSIS — Z7902 Long term (current) use of antithrombotics/antiplatelets: Secondary | ICD-10-CM

## 2022-09-18 DIAGNOSIS — Z8249 Family history of ischemic heart disease and other diseases of the circulatory system: Secondary | ICD-10-CM

## 2022-09-18 DIAGNOSIS — I252 Old myocardial infarction: Secondary | ICD-10-CM

## 2022-09-18 DIAGNOSIS — I214 Non-ST elevation (NSTEMI) myocardial infarction: Secondary | ICD-10-CM | POA: Diagnosis present

## 2022-09-18 DIAGNOSIS — Z79890 Hormone replacement therapy (postmenopausal): Secondary | ICD-10-CM

## 2022-09-18 DIAGNOSIS — Z560 Unemployment, unspecified: Secondary | ICD-10-CM

## 2022-09-18 DIAGNOSIS — I110 Hypertensive heart disease with heart failure: Secondary | ICD-10-CM | POA: Diagnosis present

## 2022-09-18 DIAGNOSIS — E039 Hypothyroidism, unspecified: Secondary | ICD-10-CM | POA: Diagnosis present

## 2022-09-18 DIAGNOSIS — Z880 Allergy status to penicillin: Secondary | ICD-10-CM

## 2022-09-18 DIAGNOSIS — Z7982 Long term (current) use of aspirin: Secondary | ICD-10-CM

## 2022-09-18 DIAGNOSIS — Z79899 Other long term (current) drug therapy: Secondary | ICD-10-CM

## 2022-09-18 DIAGNOSIS — Z716 Tobacco abuse counseling: Secondary | ICD-10-CM | POA: Diagnosis not present

## 2022-09-18 DIAGNOSIS — I5022 Chronic systolic (congestive) heart failure: Secondary | ICD-10-CM | POA: Diagnosis present

## 2022-09-18 DIAGNOSIS — I213 ST elevation (STEMI) myocardial infarction of unspecified site: Secondary | ICD-10-CM | POA: Diagnosis present

## 2022-09-18 DIAGNOSIS — I2119 ST elevation (STEMI) myocardial infarction involving other coronary artery of inferior wall: Secondary | ICD-10-CM

## 2022-09-18 DIAGNOSIS — I447 Left bundle-branch block, unspecified: Secondary | ICD-10-CM | POA: Diagnosis present

## 2022-09-18 HISTORY — PX: LEFT HEART CATH AND CORONARY ANGIOGRAPHY: CATH118249

## 2022-09-18 HISTORY — PX: CORONARY/GRAFT ACUTE MI REVASCULARIZATION: CATH118305

## 2022-09-18 LAB — CBC
HCT: 39.9 % (ref 36.0–46.0)
Hemoglobin: 13.3 g/dL (ref 12.0–15.0)
MCH: 29.1 pg (ref 26.0–34.0)
MCHC: 33.3 g/dL (ref 30.0–36.0)
MCV: 87.3 fL (ref 80.0–100.0)
Platelets: 256 10*3/uL (ref 150–400)
RBC: 4.57 MIL/uL (ref 3.87–5.11)
RDW: 13.9 % (ref 11.5–15.5)
WBC: 12.3 10*3/uL — ABNORMAL HIGH (ref 4.0–10.5)
nRBC: 0 % (ref 0.0–0.2)

## 2022-09-18 LAB — COMPREHENSIVE METABOLIC PANEL
ALT: 16 U/L (ref 0–44)
AST: 18 U/L (ref 15–41)
Albumin: 3 g/dL — ABNORMAL LOW (ref 3.5–5.0)
Alkaline Phosphatase: 69 U/L (ref 38–126)
Anion gap: 9 (ref 5–15)
BUN: 10 mg/dL (ref 6–20)
CO2: 24 mmol/L (ref 22–32)
Calcium: 8.9 mg/dL (ref 8.9–10.3)
Chloride: 105 mmol/L (ref 98–111)
Creatinine, Ser: 0.68 mg/dL (ref 0.44–1.00)
GFR, Estimated: >60 mL/min (ref >60)
Glucose, Bld: 136 mg/dL — ABNORMAL HIGH (ref 70–99)
Potassium: 3 mmol/L — ABNORMAL LOW (ref 3.5–5.1)
Sodium: 138 mmol/L (ref 135–145)
Total Bilirubin: 0.1 mg/dL — ABNORMAL LOW (ref 0.3–1.2)
Total Protein: 6.2 g/dL — ABNORMAL LOW (ref 6.5–8.1)

## 2022-09-18 LAB — MRSA NEXT GEN BY PCR, NASAL: MRSA by PCR Next Gen: NOT DETECTED

## 2022-09-18 LAB — LIPID PANEL
Cholesterol: 132 mg/dL (ref 0–200)
HDL: 38 mg/dL — ABNORMAL LOW (ref >40)
LDL Cholesterol: 69 mg/dL (ref 0–99)
Total CHOL/HDL Ratio: 3.5 RATIO
Triglycerides: 124 mg/dL (ref <150)
VLDL: 25 mg/dL (ref 0–40)

## 2022-09-18 LAB — BRAIN NATRIURETIC PEPTIDE: B Natriuretic Peptide: 439.3 pg/mL — ABNORMAL HIGH (ref 0.0–100.0)

## 2022-09-18 LAB — TROPONIN I (HIGH SENSITIVITY)
Troponin I (High Sensitivity): 21 ng/L — ABNORMAL HIGH (ref <18)
Troponin I (High Sensitivity): 1290 ng/L (ref <18)
Troponin I (High Sensitivity): 2555 ng/L (ref <18)

## 2022-09-18 LAB — POCT ACTIVATED CLOTTING TIME: Activated Clotting Time: 215 seconds

## 2022-09-18 LAB — MAGNESIUM: Magnesium: 1.8 mg/dL (ref 1.7–2.4)

## 2022-09-18 LAB — PROTIME-INR
INR: 1 (ref 0.8–1.2)
Prothrombin Time: 12.9 seconds (ref 11.4–15.2)

## 2022-09-18 LAB — HIV ANTIBODY (ROUTINE TESTING W REFLEX): HIV Screen 4th Generation wRfx: NONREACTIVE

## 2022-09-18 LAB — APTT: aPTT: 28 seconds (ref 24–36)

## 2022-09-18 LAB — HEMOGLOBIN A1C
Hgb A1c MFr Bld: 5.3 % (ref 4.8–5.6)
Mean Plasma Glucose: 105.41 mg/dL

## 2022-09-18 LAB — TSH: TSH: <0.01 u[IU]/mL — ABNORMAL LOW (ref 0.350–4.500)

## 2022-09-18 SURGERY — LEFT HEART CATH AND CORONARY ANGIOGRAPHY
Anesthesia: LOCAL

## 2022-09-18 MED ORDER — HEPARIN (PORCINE) IN NACL 1000-0.9 UT/500ML-% IV SOLN
INTRAVENOUS | Status: DC | PRN
  Administered 2022-09-18 (×3): 500 mL

## 2022-09-18 MED ORDER — ONDANSETRON HCL 4 MG/2ML IJ SOLN: 4.0000 mg | Freq: Four times a day (QID) | INTRAMUSCULAR | Status: DC | PRN

## 2022-09-18 MED ORDER — LIDOCAINE HCL (PF) 1 % IJ SOLN
INTRAMUSCULAR | Status: DC | PRN
  Administered 2022-09-18: 15 mL

## 2022-09-18 MED ORDER — METOPROLOL TARTRATE 12.5 MG HALF TABLET: 12.5000 mg | ORAL_TABLET | Freq: Two times a day (BID) | ORAL | Status: DC

## 2022-09-18 MED ORDER — TIROFIBAN HCL IN NACL 5-0.9 MG/100ML-% IV SOLN
INTRAVENOUS | Status: AC
  Filled 2022-09-18: qty 100

## 2022-09-18 MED ORDER — ASPIRIN 81 MG PO CHEW: 81.0000 mg | CHEWABLE_TABLET | Freq: Every day | ORAL | Status: DC

## 2022-09-18 MED ORDER — SODIUM CHLORIDE 0.9 % IV SOLN
INTRAVENOUS | Status: AC | PRN
  Administered 2022-09-18: 10 mL/h via INTRAVENOUS

## 2022-09-18 MED ORDER — NITROGLYCERIN 1 MG/10 ML FOR IR/CATH LAB
INTRA_ARTERIAL | Status: AC
  Filled 2022-09-18: qty 10

## 2022-09-18 MED ORDER — MORPHINE SULFATE (PF) 2 MG/ML IV SOLN
2.0000 mg | INTRAVENOUS | Status: DC | PRN
  Administered 2022-09-18 – 2022-09-19 (×3): 2 mg via INTRAVENOUS
  Filled 2022-09-18 (×3): qty 1

## 2022-09-18 MED ORDER — ASPIRIN 81 MG PO TBEC
81.0000 mg | DELAYED_RELEASE_TABLET | Freq: Every day | ORAL | Status: DC
  Administered 2022-09-19 – 2022-09-20 (×2): 81 mg via ORAL
  Filled 2022-09-18 (×2): qty 1

## 2022-09-18 MED ORDER — TICAGRELOR 90 MG PO TABS
ORAL_TABLET | ORAL | Status: DC | PRN
  Administered 2022-09-18: 180 mg via ORAL

## 2022-09-18 MED ORDER — SODIUM CHLORIDE 0.9 % IV SOLN: 250.0000 mL | INTRAVENOUS | Status: DC | PRN

## 2022-09-18 MED ORDER — ASPIRIN 81 MG PO CHEW: 324.0000 mg | CHEWABLE_TABLET | ORAL | Status: DC

## 2022-09-18 MED ORDER — SODIUM CHLORIDE 0.9% FLUSH
3.0000 mL | Freq: Two times a day (BID) | INTRAVENOUS | Status: DC
  Administered 2022-09-19 – 2022-09-20 (×3): 3 mL via INTRAVENOUS

## 2022-09-18 MED ORDER — TICAGRELOR 90 MG PO TABS
90.0000 mg | ORAL_TABLET | Freq: Two times a day (BID) | ORAL | Status: DC
  Administered 2022-09-19 – 2022-09-20 (×4): 90 mg via ORAL
  Filled 2022-09-18 (×4): qty 1

## 2022-09-18 MED ORDER — LEVALBUTEROL HCL 0.63 MG/3ML IN NEBU: 0.6300 mg | INHALATION_SOLUTION | Freq: Four times a day (QID) | RESPIRATORY_TRACT | Status: DC | PRN

## 2022-09-18 MED ORDER — CARVEDILOL 3.125 MG PO TABS
3.1250 mg | ORAL_TABLET | Freq: Two times a day (BID) | ORAL | Status: DC
  Administered 2022-09-18 – 2022-09-20 (×4): 3.125 mg via ORAL
  Filled 2022-09-18 (×4): qty 1

## 2022-09-18 MED ORDER — ACETAMINOPHEN 325 MG PO TABS: 650.0000 mg | ORAL_TABLET | ORAL | Status: DC | PRN

## 2022-09-18 MED ORDER — NICOTINE 14 MG/24HR TD PT24
14.0000 mg | MEDICATED_PATCH | Freq: Every day | TRANSDERMAL | Status: DC
  Administered 2022-09-19 – 2022-09-20 (×2): 14 mg via TRANSDERMAL
  Filled 2022-09-18 (×2): qty 1

## 2022-09-18 MED ORDER — HEPARIN SODIUM (PORCINE) 1000 UNIT/ML IJ SOLN
INTRAMUSCULAR | Status: DC | PRN
  Administered 2022-09-18: 3000 [IU] via INTRAVENOUS
  Administered 2022-09-18: 6000 [IU] via INTRAVENOUS

## 2022-09-18 MED ORDER — ACETAMINOPHEN 325 MG PO TABS
650.0000 mg | ORAL_TABLET | ORAL | Status: DC | PRN
  Administered 2022-09-19: 650 mg via ORAL
  Filled 2022-09-18: qty 2

## 2022-09-18 MED ORDER — ROSUVASTATIN CALCIUM 20 MG PO TABS
40.0000 mg | ORAL_TABLET | Freq: Every day | ORAL | Status: DC
  Administered 2022-09-18 – 2022-09-20 (×3): 40 mg via ORAL
  Filled 2022-09-18 (×3): qty 2

## 2022-09-18 MED ORDER — LABETALOL HCL 5 MG/ML IV SOLN: 10.0000 mg | INTRAVENOUS | Status: AC | PRN

## 2022-09-18 MED ORDER — ASPIRIN 300 MG RE SUPP: 300.0000 mg | RECTAL | Status: DC

## 2022-09-18 MED ORDER — TICAGRELOR 90 MG PO TABS
ORAL_TABLET | ORAL | Status: AC
  Filled 2022-09-18: qty 2

## 2022-09-18 MED ORDER — SODIUM CHLORIDE 0.9 % IV SOLN: INTRAVENOUS | Status: AC

## 2022-09-18 MED ORDER — IOHEXOL 350 MG/ML SOLN
INTRAVENOUS | Status: DC | PRN
  Administered 2022-09-18: 118 mL

## 2022-09-18 MED ORDER — SODIUM CHLORIDE 0.9% FLUSH: 3.0000 mL | INTRAVENOUS | Status: DC | PRN

## 2022-09-18 MED ORDER — NITROGLYCERIN 0.4 MG SL SUBL: 0.4000 mg | SUBLINGUAL_TABLET | SUBLINGUAL | Status: DC | PRN

## 2022-09-18 MED ORDER — TIROFIBAN HCL IN NACL 5-0.9 MG/100ML-% IV SOLN: 0.1500 ug/kg/min | INTRAVENOUS | Status: AC

## 2022-09-18 MED ORDER — POTASSIUM CHLORIDE CRYS ER 20 MEQ PO TBCR
40.0000 meq | EXTENDED_RELEASE_TABLET | Freq: Once | ORAL | Status: AC
  Administered 2022-09-18: 40 meq via ORAL
  Filled 2022-09-18: qty 2

## 2022-09-18 MED ORDER — TIROFIBAN HCL IN NACL 5-0.9 MG/100ML-% IV SOLN
INTRAVENOUS | Status: DC | PRN
  Administered 2022-09-18: .15 ug/kg/min via INTRAVENOUS

## 2022-09-18 MED ORDER — HEPARIN (PORCINE) IN NACL 1000-0.9 UT/500ML-% IV SOLN
INTRAVENOUS | Status: AC
  Filled 2022-09-18: qty 1000

## 2022-09-18 MED ORDER — HYDRALAZINE HCL 20 MG/ML IJ SOLN: 10.0000 mg | INTRAMUSCULAR | Status: AC | PRN

## 2022-09-18 MED ORDER — LIDOCAINE HCL (PF) 1 % IJ SOLN
INTRAMUSCULAR | Status: AC
  Filled 2022-09-18: qty 30

## 2022-09-18 MED ORDER — HEPARIN SODIUM (PORCINE) 1000 UNIT/ML IJ SOLN
INTRAMUSCULAR | Status: AC
  Filled 2022-09-18: qty 10

## 2022-09-18 MED ORDER — LEVOTHYROXINE SODIUM 75 MCG PO TABS
75.0000 ug | ORAL_TABLET | Freq: Every day | ORAL | Status: DC
  Administered 2022-09-19 – 2022-09-20 (×2): 75 ug via ORAL
  Filled 2022-09-18 (×2): qty 1

## 2022-09-18 MED ORDER — ALPRAZOLAM 0.25 MG PO TABS
0.2500 mg | ORAL_TABLET | Freq: Two times a day (BID) | ORAL | Status: DC | PRN
  Administered 2022-09-19: 0.25 mg via ORAL
  Filled 2022-09-18: qty 1

## 2022-09-18 MED ORDER — TIROFIBAN (AGGRASTAT) BOLUS VIA INFUSION
INTRAVENOUS | Status: DC | PRN
  Administered 2022-09-18: 1300 ug via INTRAVENOUS

## 2022-09-18 MED ORDER — POTASSIUM CHLORIDE CRYS ER 20 MEQ PO TBCR
40.0000 meq | EXTENDED_RELEASE_TABLET | Freq: Once | ORAL | Status: AC
  Administered 2022-09-19: 40 meq via ORAL
  Filled 2022-09-18: qty 2

## 2022-09-18 SURGICAL SUPPLY — 18 items
BALLN EMERGE MR 2.0X12 (BALLOONS) ×1
BALLN EMERGE MR 3.5X15 (BALLOONS) ×1
BALLOON EMERGE MR 2.0X12 (BALLOONS) IMPLANT
BALLOON EMERGE MR 3.5X15 (BALLOONS) IMPLANT
BALLOON TAKERU 1.5X6 (BALLOONS) IMPLANT
CATH INFINITI 5FR MULTPACK ANG (CATHETERS) IMPLANT
CATH VISTA GUIDE 6FR JR4 (CATHETERS) IMPLANT
KIT ENCORE 26 ADVANTAGE (KITS) IMPLANT
KIT HEART LEFT (KITS) ×1 IMPLANT
PACK CARDIAC CATHETERIZATION (CUSTOM PROCEDURE TRAY) ×1 IMPLANT
SHEATH PINNACLE 6F 10CM (SHEATH) IMPLANT
STENT ONYX FRONTIER 2.5X12 (Permanent Stent) IMPLANT
STENT SYNERGY XD 2.50X8 (Permanent Stent) IMPLANT
SYNERGY XD 2.50X8 (Permanent Stent) ×2 IMPLANT
TRANSDUCER W/STOPCOCK (MISCELLANEOUS) ×1 IMPLANT
TUBING CIL FLEX 10 FLL-RA (TUBING) ×1 IMPLANT
WIRE ASAHI PROWATER 180CM (WIRE) IMPLANT
WIRE EMERALD 3MM-J .035X150CM (WIRE) IMPLANT

## 2022-09-18 NOTE — TOC Benefit Eligibility Note (Signed)
Patient Product/process development scientist completed.    The patient is currently admitted and upon discharge could be taking Brilinta 90 mg.  The current 30 day co-pay is $50.00.   The patient is insured through [employer]     Whitney May, CPhT Pharmacy Patient Advocate Specialist [facility] Patient Advocate Team Direct Number: [phone]  Fax: [phone]

## 2022-09-18 NOTE — H&P (Addendum)
Cardiology Admission History and Physical   Patient ID: Whitney May MRN: [id number]; DOB: 20-Mar-1961   Admission date: 09/18/2022  PCP:  Olga Coaster, [place] [facility] Cardiologist:  Kate Sable, MD (Inactive)  Cardiology APP:  Lendon Colonel, NP       Chief Complaint:  chest pain/STEMI  Patient Profile:   Whitney May is a 61 y.o. female with hx of CAD with NSTEMI in 2019, with stent to RCA and then in 2020 NSTEMI with plaque rupture of of RCA in-stent with 2 overlying stents to RCA, hypothyroidism, HLD, tobacco use, ETOH use and ischemic cardiomyopathy EF 35-40% in 2020  who is being seen 09/18/2022 for the evaluation of STEMI.  History of Present Illness:   Whitney May with above hx and no follow up since NSTEMI in 2020.  She also has PAD for moderate left calf claudication due to long occlusion of the left SFA.  She has been treated medically. She was prescribed Pletal but she is having significant dizziness with it -no lifestyle limiting issues in 2020.  + tobacco use, HLD on crestor   Pt presented to ER by EMS after an hour of chest pain.  She had stopped her plavix though we had recommended long term use.- had not been seen in office.  She had left BBB and more severe inf ST segment elevation and taken to cath lab emergently.     EKG:  The ECG that was done ST with LBBB and ST elevation was personally reviewed   Na 138, K+ 3.0 BUN 10 Cr 0.68  hs troponin 21 LDL 69 HDL 38 WBC 12.3 Hgb 13.3 plts 256   Past Medical History:  Diagnosis Date   Alcohol use    CAD (coronary artery disease)    Hyperlipidemia, mixed    Hypothyroidism    Ischemic cardiomyopathy    Tobacco abuse     Past Surgical History:  Procedure Laterality Date   CESAREAN SECTION     CORONARY STENT INTERVENTION N/A 03/07/2019   Procedure: CORONARY STENT INTERVENTION;  Surgeon: Sherren Mocha, MD;  Location: [facility];  Service: Cardiovascular;  Laterality:  N/A;   CORONARY/GRAFT ACUTE MI REVASCULARIZATION N/A 08/02/2018   Procedure: Coronary/Graft Acute MI Revascularization;  Surgeon: Leonie Man, MD;  Location: [facility];  Service: Cardiovascular;  Laterality: N/A;   LEFT HEART CATH AND CORONARY ANGIOGRAPHY N/A 08/02/2018   Procedure: LEFT HEART CATH AND CORONARY ANGIOGRAPHY;  Surgeon: Leonie Man, MD;  Location: [facility];  Service: Cardiovascular;  Laterality: N/A;   LEFT HEART CATH AND CORONARY ANGIOGRAPHY N/A 03/07/2019   Procedure: LEFT HEART CATH AND CORONARY ANGIOGRAPHY;  Surgeon: Jolaine Artist, MD;  Location: [facility];  Service: Cardiovascular;  Laterality: N/A;     Medications Prior to Admission: Prior to Admission medications   Medication Sig Start Date End Date Taking? Authorizing Provider  aspirin 81 MG chewable tablet Chew 1 tablet (81 mg total) by mouth daily. 08/07/18   Cheryln Manly, NP  carvedilol (COREG) 3.125 MG tablet Take 1 tablet by mouth twice daily 04/22/20   Herminio Commons, MD  clopidogrel (PLAVIX) 75 MG tablet Take 1 tablet (75 mg total) by mouth daily. 03/08/19   Almyra Deforest, PA  levothyroxine (SYNTHROID) 75 MCG tablet Take 1 tablet (75 mcg total) by mouth daily. 03/14/19   Herminio Commons, MD  Multiple Vitamin (MULTIVITAMIN WITH MINERALS) TABS tablet  Take 1 tablet by mouth daily. 09/25/15   Hongalgi, Lenis Dickinson, MD  nitroGLYCERIN (NITROSTAT) 0.4 MG SL tablet Place 1 tablet (0.4 mg total) under the tongue every 5 (five) minutes x 3 doses as needed for chest pain. 08/06/18   Cheryln Manly, NP  rosuvastatin (CRESTOR) 40 MG tablet Take 1 tablet (40 mg total) by mouth daily. 03/17/20   Wellington Hampshire, MD     Allergies:    Allergies  Allergen Reactions   Penicillins Other (See Comments)    Fever, vomiting Has patient had a PCN reaction causing immediate rash, facial/tongue/throat swelling, SOB or lightheadedness with hypotension: NO Has patient had a PCN reaction  causing severe rash involving mucus membranes or skin necrosis: NO Has patient had a PCN reaction that required hospitalization: NO Has patient had a PCN reaction occurring within the last 10 years: NO If all of the above answers are "NO", then may proceed Childhood allergy    Social History:   Social History   Socioeconomic History   Marital status: Married    Spouse name: Not on file   Number of children: Not on file   Years of education: Not on file   Highest education level: Not on file  Occupational History   Not on file  Tobacco Use   Smoking status: Former    Packs/day: 1.00    Years: 22.00    Total pack years: 22.00    Types: Cigarettes    Quit date: 08/09/2018    Years since quitting: 4.1   Smokeless tobacco: Never  Substance and Sexual Activity   Alcohol use: Yes    Alcohol/week: 8.0 standard drinks of alcohol    Types: 8 Glasses of wine per week   Drug use: No   Sexual activity: Not on file  Other Topics Concern   Not on file  Social History Narrative   Married   Gets regular exercise   Social Determinants of Health   Financial Resource Strain: Not on file  Food Insecurity: Not on file  Transportation Needs: Not on file  Physical Activity: Not on file  Stress: Not on file  Social Connections: Not on file  Intimate Partner Violence: Not on file    Family History:   The patient's family history includes Coronary artery disease in her maternal grandmother and mother; Diabetes in her father; Hypertension in her father.    ROS:  Please see the history of present illness.  General:no colds or fevers, no weight changes Skin:no rashes or ulcers HEENT:no blurred vision, no congestion CV:see HPI PUL:see HPI GI:no diarrhea constipation or melena, no indigestion GU:no hematuria, no dysuria MS:no joint pain, no claudication Neuro:no syncope, no lightheadedness Endo:no diabetes, + thyroid disease All other ROS reviewed and negative.     Physical  Exam/Data:   Vitals:   09/18/22 1730 09/18/22 1735 09/18/22 1800 09/18/22 1810  BP: (!) 103/59 (!) 93/58 (!) 107/93 114/69  Pulse: 72 88 90 84  Resp: _0 SpO2: 94% 93% 96% 97%  Weight:       No intake or output data in the 24 hours ending 09/18/22 1902    09/18/2022    3:11 PM 03/08/2019    4:35 AM 03/07/2019    1:49 PM  Last 3 Weights  Weight (lbs) 114 lb 10.2 oz 132 lb 6.4 oz 131 lb 8 oz  Weight (kg) 52 kg 60.056 kg 59.648 kg     Body mass index is 20.97 kg/m.  General:  frail female in no acute distress HEENT: normal Neck: no JVD Vascular: No carotid bruits; Distal pulses 2+ bilaterally   Cardiac:  normal S1, S2; RRR; no murmur gallup rub or click Lungs:  no rales but has inspiratory wheezes to auscultation bilaterally, no wheezing, rhonchi or rales  Abd: soft, nontender, no hepatomegaly  Ext: no edema Musculoskeletal:  No deformities, BUE and BLE strength normal and equal Skin: warm and dry  Neuro:  alert and oriented X 3 MAE follows commands, no focal abnormalities noted Psych:  Normal affect     Relevant CV Studies:  Cardiac cath 09/18/22     Ost LM to Dist LM lesion is 50% stenosed.   Prox RCA to Dist RCA lesion is 90% stenosed.   RPDA lesion is 99% stenosed.   RPAV lesion is 95% stenosed.   A drug-eluting stent was successfully placed.   A drug-eluting stent was successfully placed.   Balloon angioplasty was performed.   Post intervention, there is a 0% residual stenosis.   Post intervention, there is a 0% residual stenosis.   Post intervention, there is a 0% residual stenosis.   There is severe left ventricular systolic dysfunction.   LV end diastolic pressure is moderately elevated.   The left ventricular ejection fraction is less than 25% by visual estimate.    Successful stenting of the ostium of the PLA and PDA branches and PTCA of the entire previously stented RCA in the setting of inferior STEMI.  The patient had apparently stopped her  clopidogrel several days prior to admission.  The pain began an hour prior to transfer.  We talked about the importance of smoking cessation.  Her EF is in the 20% range and she will need to be aggressively treated by the advanced heart failure service.  Aggrastat will continue for 6 hours total.  The sheath will be removed once ACT falls below 170 and pressure held.  The patient left lab stable condition.   Quay Burow. MD, [facility]  Diagnostic Dominance: Right  Intervention     Echo 03/20/2019 IMPRESSIONS     1. The left ventricle has moderate-severely reduced systolic function,  with an ejection fraction of 30-35%. The cavity size was normal. Left  ventricular diastolic Doppler parameters are consistent with impaired  relaxation.   2. Anteroseptal, inferoseptal, inferior walls are hypokinetic.   3. The aortic valve is tricuspid. No stenosis of the aortic valve.   4. The aortic root is normal in size and structure.   5. Pulmonary hypertension is indeterminant, inadequate TR jet.   6. The interatrial septum was not well visualized.   FINDINGS   Left Ventricle: The left ventricle has moderate-severely reduced systolic  function, with an ejection fraction of 30-35%. The cavity size was normal.  There is no increase in left ventricular wall thickness. Left ventricular  diastolic Doppler parameters  are consistent with impaired relaxation. Normal left ventricular filling  pressures Anteroseptal, inferoseptal, inferior walls are hypokinetic.     Right Ventricle: The right ventricle has low normal systolic function. The  cavity was normal. There is no increase in right ventricular wall  thickness.   Left Atrium: Left atrial size was normal in size.   Right Atrium: Right atrial size was normal in size. Right atrial pressure  is estimated at 10 mmHg.   Interatrial Septum: The interatrial septum was not well visualized.   Pericardium: There is no evidence of pericardial effusion.    Mitral Valve: The mitral valve  is normal in structure. Mitral valve  regurgitation is mild by color flow Doppler.   Tricuspid Valve: The tricuspid valve is normal in structure. Tricuspid  valve regurgitation is trivial by color flow Doppler.   Aortic Valve: The aortic valve is tricuspid Aortic valve regurgitation was  not visualized by color flow Doppler. There is No stenosis of the aortic  valve.   Pulmonic Valve: The pulmonic valve was not well visualized. Pulmonic valve  regurgitation is not visualized by color flow Doppler. No evidence of  pulmonic stenosis.   Aorta: The aortic root is normal in size and structure.   Pulmonary Artery: Pulmonary hypertension is indeterminant, inadequate TR  jet.   Venous: The inferior vena cava is normal in size with greater than 50%  respiratory variability.   Compared to previous exam: Echocardiogram done 09/23/18 showed an EF of 55%  Laboratory Data:  High Sensitivity Troponin:   Recent Labs  Lab 09/18/22 1513  TROPONINIHS 21*      Chemistry Recent Labs  Lab 09/18/22 1513  NA 138  K 3.0*  CL 105  CO2 24  GLUCOSE 136*  BUN 10  CREATININE 0.68  CALCIUM 8.9  GFRNONAA >60  ANIONGAP 9    Recent Labs  Lab 09/18/22 1513  PROT 6.2*  ALBUMIN 3.0*  AST 18  ALT 16  ALKPHOS 69  BILITOT 0.1*   Lipids  Recent Labs  Lab 09/18/22 1513  CHOL 132  TRIG 124  HDL 38*  LDLCALC 69  CHOLHDL 3.5   Hematology Recent Labs  Lab 09/18/22 1513  WBC 12.3*  RBC 4.57  HGB 13.3  HCT 39.9  MCV 87.3  MCH 29.1  MCHC 33.3  RDW 13.9  PLT 256   Thyroid No results for input(s): "TSH", "FREET4" in the last 168 hours. BNPNo results for input(s): "BNP", "PROBNP" in the last 168 hours.  DDimer No results for input(s): "DDIMER" in the last 168 hours.   Radiology/Studies:  CARDIAC CATHETERIZATION  Result Date: 09/18/2022 Images from the original result were not included.   Ost LM to Dist LM lesion is 50% stenosed.   Prox RCA to  Dist RCA lesion is 90% stenosed.   RPDA lesion is 99% stenosed.   RPAV lesion is 95% stenosed.   A drug-eluting stent was successfully placed.   A drug-eluting stent was successfully placed.   Balloon angioplasty was performed.   Post intervention, there is a 0% residual stenosis.   Post intervention, there is a 0% residual stenosis.   Post intervention, there is a 0% residual stenosis.   There is severe left ventricular systolic dysfunction.   LV end diastolic pressure is moderately elevated.   The left ventricular ejection fraction is less than 25% by visual estimate. Whitney May is a 61 y.o. female  [id number] LOCATION:  FACILITY: [facility] PHYSICIAN: Quay Burow, M.D. 1961-07-22 DATE OF PROCEDURE:  09/18/2022 DATE OF DISCHARGE: CARDIAC CATHETERIZATION / PCI DES RCA History obtained from chart review.  61 year old Caucasian female with history of prior CAD status post remote RCA stenting.  She had restenting of RCA by Dr. Burt Knack in 2020 in the setting of acute stent thrombosis.  EF was in the 30% range.  She continues to smoke.  She apparently ran out of her clopidogrel.  She developed chest pain an hour prior to arrival.  She has low bundle-branch block and had more severe inferior ST segment elevation.  She was brought urgently to the Cath Lab for angiography and intervention. PROCEDURE  DESCRIPTION: The patient was brought to the second floor [facility] cath lab in the postabsorptive state.  She was not premedicated .  Her right groin was prepped and shaved in usual sterile fashion. Xylocaine 1% was used for local anesthesia. A 6 French sheath was inserted into the right common femoral artery using standard Seldinger technique.  A 5 [place] JL 4 diagnostic catheter and a 6 [place] JR4 guide catheter used for selective coronary angiography and left ventriculography expectedly.  Isovue dye is used for the entirety of the case (118 cc of contrast totaled the patient).  Retrograde aortic, ventricular and  pullback pressures were recorded. The patient received 9000 units of heparin IV with an ACT of 323 to begin with and final ACT of 233 after which she received an additional 3000's of heparin.  She received Aggrastat bolus and infusion.  She also received Brilinta 180 mg p.o. I crossed the ostium of the PDA through the previous placed stent struts and diet predilated with a 2 mm balloon.  Then placed a 2.5 x 8 mm Synergy drug-eluting stent in the ostium of the PDA and deployed at 14 atm resulting in reduction of a 99% stenosis to 0% residual.  Following this I wired the PLA branch and was unable to pass a stent and therefore predilated with a 1.5 x 6 mm Terumo balloon and then was able to pass a 2.5 x 8 mm long Synergy drug-eluting stent and deployed at the ostium of the PLA at 14 to 16 atm.  Following this I dilated the entire previously stented mid and distal RCA with a 3.5 x 15 mm noncompliant balloon resulting in reduction of a 95% stenosis to less than 20% residual.  Patient tolerated procedure well.   Successful stenting of the ostium of the PLA and PDA branches and PTCA of the entire previously stented RCA in the setting of inferior STEMI.  The patient had apparently stopped her clopidogrel several days prior to admission.  The pain began an hour prior to transfer.  We talked about the importance of smoking cessation.  Her EF is in the 20% range and she will need to be aggressively treated by the advanced heart failure service.  Aggrastat will continue for 6 hours total.  The sheath will be removed once ACT falls below 170 and pressure held.  The patient left lab stable condition. Quay Burow. MD, [facility] 09/18/2022 4:31 PM      Assessment and Plan:   STEMI of inf wall,Successful stenting of the ostium of the PLA and PDA branches and PTCA of the entire previously stented RCA in the setting of inferior STEMI.  --added Brilinta, aggrastat and BB along with ASA, statin.   2. ICM with decrease to EF 20%  at cath.  Echo ordered.  Will ask AHF to see tomorrow.   Plan for GDMT.  3. Tobacco use discussed need to stop she would like Nicoderm patch  CXR pending  4. HLD with LDL 68 on lipitor 5. PAD with stable claudication  only an issue if she walks long distances.   Risk Assessment/Risk Scores:    TIMI Risk Score for ST  Elevation MI:   The patient's TIMI risk score is 3, which indicates a 4.4% risk of all cause mortality at 30 days.        Severity of Illness: The appropriate patient status for this patient is INPATIENT. Inpatient status is judged to be reasonable and necessary in order to provide the  required intensity of service to ensure the patient's safety. The patient's presenting symptoms, physical exam findings, and initial radiographic and laboratory data in the context of their chronic comorbidities is felt to place them at high risk for further clinical deterioration. Furthermore, it is not anticipated that the patient will be medically stable for discharge from the hospital within 2 midnights of admission.   * I certify that at the point of admission it is my clinical judgment that the patient will require inpatient hospital care spanning beyond 2 midnights from the point of admission due to high intensity of service, high risk for further deterioration and high frequency of surveillance required.*   For questions or updates, please contact [facility] Please consult [email or web address] for contact info under     Signed, Cecilie Kicks, NP  09/18/2022 7:02 PM    Agree with note written by Cecilie Kicks RNP  61 y/o WF s/p RCA stent 2019 with re intervention by Dr Burt Knack 2020. She has ISCM with EF 35% range, HTN, HLD and on going tobacco abuse. She presented with ongoing CP by EMS with LBBB and inferior ST elevation. She was hemodynamically stable and was taken urgently to the cath lab for angio and intervention.   Quay Burow 09/18/2022 8:20 PM

## 2022-09-19 ENCOUNTER — Encounter (HOSPITAL_COMMUNITY): Payer: Self-pay | Admitting: Cardiovascular Disease

## 2022-09-19 ENCOUNTER — Inpatient Hospital Stay (HOSPITAL_COMMUNITY): Payer: BC Managed Care – PPO

## 2022-09-19 ENCOUNTER — Other Ambulatory Visit (HOSPITAL_COMMUNITY): Payer: Self-pay

## 2022-09-19 DIAGNOSIS — I5043 Acute on chronic combined systolic (congestive) and diastolic (congestive) heart failure: Secondary | ICD-10-CM

## 2022-09-19 DIAGNOSIS — I2510 Atherosclerotic heart disease of native coronary artery without angina pectoris: Secondary | ICD-10-CM | POA: Diagnosis not present

## 2022-09-19 LAB — POCT I-STAT, CHEM 8
BUN: 11 mg/dL (ref 6–20)
Calcium, Ion: 1.28 mmol/L (ref 1.15–1.40)
Chloride: 102 mmol/L (ref 98–111)
Creatinine, Ser: 0.6 mg/dL (ref 0.44–1.00)
Glucose, Bld: 135 mg/dL — ABNORMAL HIGH (ref 70–99)
HCT: 44 % (ref 36.0–46.0)
Hemoglobin: 15 g/dL (ref 12.0–15.0)
Potassium: 3.2 mmol/L — ABNORMAL LOW (ref 3.5–5.1)
Sodium: 141 mmol/L (ref 135–145)
TCO2: 24 mmol/L (ref 22–32)

## 2022-09-19 LAB — BASIC METABOLIC PANEL
Anion gap: 5 (ref 5–15)
BUN: 12 mg/dL (ref 6–20)
CO2: 23 mmol/L (ref 22–32)
Calcium: 8.6 mg/dL — ABNORMAL LOW (ref 8.9–10.3)
Chloride: 113 mmol/L — ABNORMAL HIGH (ref 98–111)
Creatinine, Ser: 0.73 mg/dL (ref 0.44–1.00)
GFR, Estimated: >60 mL/min (ref >60)
Glucose, Bld: 98 mg/dL (ref 70–99)
Potassium: 4.2 mmol/L (ref 3.5–5.1)
Sodium: 141 mmol/L (ref 135–145)

## 2022-09-19 LAB — CBC
HCT: 35.2 % — ABNORMAL LOW (ref 36.0–46.0)
Hemoglobin: 11.3 g/dL — ABNORMAL LOW (ref 12.0–15.0)
MCH: 28.9 pg (ref 26.0–34.0)
MCHC: 32.1 g/dL (ref 30.0–36.0)
MCV: 90 fL (ref 80.0–100.0)
Platelets: 233 10*3/uL (ref 150–400)
RBC: 3.91 MIL/uL (ref 3.87–5.11)
RDW: 14.2 % (ref 11.5–15.5)
WBC: 7.6 10*3/uL (ref 4.0–10.5)
nRBC: 0 % (ref 0.0–0.2)

## 2022-09-19 LAB — ECHOCARDIOGRAM COMPLETE
AR max vel: 2.31 cm2
AV Area VTI: 2.62 cm2
AV Area mean vel: 2.61 cm2
AV Mean grad: 3 mmHg
AV Peak grad: 6.4 mmHg
Ao pk vel: 1.26 m/s
Area-P 1/2: 4.89 cm2
S' Lateral: 4.9 cm
Weight: 1830.7 oz

## 2022-09-19 LAB — POCT ACTIVATED CLOTTING TIME
Activated Clotting Time: 137 seconds
Activated Clotting Time: 233 seconds
Activated Clotting Time: 323 seconds

## 2022-09-19 LAB — TSH: TSH: <0.01 u[IU]/mL — ABNORMAL LOW (ref 0.350–4.500)

## 2022-09-19 MED ORDER — CHLORHEXIDINE GLUCONATE CLOTH 2 % EX PADS
6.0000 | MEDICATED_PAD | Freq: Every day | CUTANEOUS | Status: DC
  Administered 2022-09-19 – 2022-09-20 (×2): 6 via TOPICAL

## 2022-09-19 MED ORDER — ATROPINE SULFATE 1 MG/10ML IJ SOSY
PREFILLED_SYRINGE | INTRAMUSCULAR | Status: AC
  Filled 2022-09-19: qty 10

## 2022-09-19 MED ORDER — DAPAGLIFLOZIN PROPANEDIOL 10 MG PO TABS
10.0000 mg | ORAL_TABLET | Freq: Every day | ORAL | Status: DC
  Administered 2022-09-19 – 2022-09-20 (×2): 10 mg via ORAL
  Filled 2022-09-19 (×2): qty 1

## 2022-09-19 MED ORDER — SPIRONOLACTONE 12.5 MG HALF TABLET
12.5000 mg | ORAL_TABLET | Freq: Every day | ORAL | Status: DC
  Administered 2022-09-19: 12.5 mg via ORAL
  Filled 2022-09-19 (×2): qty 1

## 2022-09-19 NOTE — Progress Notes (Signed)
ACT Checked [phone] - [phone] Patient educated on removal process. Atropine at bedside. Arterial Sheath removed 0110 Manual Pressure held for 15 min. Vital Signs stable throughout.  DP pulses monitored by second nurse. Level 0, No hematoma or complications.  Gauze and Tegaderm dressing applied. Pt educated with post removal instructions.  Ruben Gottron RN

## 2022-09-19 NOTE — Consult Note (Signed)
Advanced Heart Failure Team Consult Note   Primary Physician: Olga Coaster, FNP PCP-Cardiologist:  Kate Sable, MD (Inactive)  Reason for Consultation: STEMI  HPI:    Whitney May is seen today for evaluation of STEMI at the request of Dr. Gwenlyn Found, cardiology.   Whitney May is a 61 y.o. female with hx of CAD with NSTEMI in 2019 with stent to RCA and then in 2020 NSTEMI with plaque rupture of RCA in-stent with 2 overlying stents to RCA, hypothyroidism, HLD, PAD, tobacco use, ETOH and ischemic cardiomyopathy EF 30-35% on 5/20 echo who is being seen 09/18/2022 for the evaluation of STEMI with ischemic cardiomyopathy. She has a history of PAD with long occlusion left SFA.   Patient recently lost her job at [employer]  She smokes around 1 ppd.   Presented with chest pain for 1 hr continuously.  She had had on and off chest pain for several days prior.  She had run out of Plavix for several days.  EKG showed LBBB and prominent inferior ST elevation. She had emergent cath for inferior STEMI with 90% ISR mid-distal RCA, 99% stenosis ostial PDA and 95% stenosis ostial PLV. Patient had PTCA to the long mid-distal RCA stented segment and DES to ostial PLV and PDA.  LV-gram showed EF in 20% range.  This morning, she denies chest pain or dyspnea.  Anxious about her heart disease and concerned about loss of insurance.   PMH: 1. Hypothyroidism 2. H/o heavy ETOH 3. Active smoker 4. CAD: NSTEMI 2019 with PCI to RCA.  - NSTEMI 2020 with stents to RCA.  - Inferior STEMI (11/23): 90% ISR mid-distal RCA, 99% stenosis ostial PDA and 95% stenosis ostial PLV. Patient had PTCA to the long mid-distal RCA stented segment and DES to ostial PLV and PDA.   5. Chronic systolic CHF: Ischemic cardiomyopathy.  - Echo (5/20) with EF 30-35%.  - LV-gram 11/23 with EF 20% 6. PAD: Long occlusion left SFA  Home Medications Prior to Admission medications   Medication Sig Start Date End Date Taking? Authorizing Provider   aspirin 81 MG chewable tablet Chew 1 tablet (81 mg total) by mouth daily. 08/07/18  Yes Reino Bellis B, NP  clopidogrel (PLAVIX) 75 MG tablet Take 1 tablet (75 mg total) by mouth daily. 03/08/19  Yes Almyra Deforest, PA  levothyroxine (SYNTHROID) 137 MCG tablet Take 137 mcg by mouth daily before breakfast.   Yes [provider]  Multiple Vitamin (MULTIVITAMIN WITH MINERALS) TABS tablet Take 1 tablet by mouth daily. 09/25/15  Yes Hongalgi, Lenis Dickinson, MD  Omega-3 Fatty Acids (FISH OIL) 1200 MG CAPS Take 1,200 mg by mouth daily.   Yes [provider]  rosuvastatin (CRESTOR) 40 MG tablet Take 1 tablet (40 mg total) by mouth daily. 03/17/20  Yes Wellington Hampshire, MD    Past Surgical History: Past Surgical History:  Procedure Laterality Date   CESAREAN SECTION     CORONARY STENT INTERVENTION N/A 03/07/2019   Procedure: CORONARY STENT INTERVENTION;  Surgeon: Sherren Mocha, MD;  Location: [facility];  Service: Cardiovascular;  Laterality: N/A;   CORONARY/GRAFT ACUTE MI REVASCULARIZATION N/A 08/02/2018   Procedure: Coronary/Graft Acute MI Revascularization;  Surgeon: Leonie Man, MD;  Location: [facility];  Service: Cardiovascular;  Laterality: N/A;   CORONARY/GRAFT ACUTE MI REVASCULARIZATION N/A 09/18/2022   Procedure: Coronary/Graft Acute MI Revascularization;  Surgeon: Lorretta Harp, MD;  Location: [facility];  Service: Cardiovascular;  Laterality: N/A;  LEFT HEART CATH AND CORONARY ANGIOGRAPHY N/A 08/02/2018   Procedure: LEFT HEART CATH AND CORONARY ANGIOGRAPHY;  Surgeon: Harding, David W, MD;  Location: [facility];  Service: Cardiovascular;  Laterality: N/A;   LEFT HEART CATH AND CORONARY ANGIOGRAPHY N/A 03/07/2019   Procedure: LEFT HEART CATH AND CORONARY ANGIOGRAPHY;  Surgeon: Bensimhon, Daniel R, MD;  Location: [facility];  Service: Cardiovascular;  Laterality: N/A;   LEFT HEART CATH AND CORONARY ANGIOGRAPHY N/A 09/18/2022   Procedure:  LEFT HEART CATH AND CORONARY ANGIOGRAPHY;  Surgeon: Berry, Jonathan J, MD;  Location: [facility];  Service: Cardiovascular;  Laterality: N/A;    Family History: Family History  Problem Relation Age of Onset   Coronary artery disease Mother    Coronary artery disease Maternal Grandmother    Hypertension Father    Diabetes Father     Social History: Social History   Socioeconomic History   Marital status: Married    Spouse name: Not on file   Number of children: Not on file   Years of education: Not on file   Highest education level: Not on file  Occupational History   Not on file  Tobacco Use   Smoking status: Former    Packs/day: 1.00    Years: 22.00    Total pack years: 22.00    Types: Cigarettes    Quit date: 08/09/2018    Years since quitting: 4.1   Smokeless tobacco: Never  Substance and Sexual Activity   Alcohol use: Yes    Alcohol/week: 8.0 standard drinks of alcohol    Types: 8 Glasses of wine per week   Drug use: No   Sexual activity: Not on file  Other Topics Concern   Not on file  Social History Narrative   Married   Gets regular exercise   Social Determinants of Health   Financial Resource Strain: Not on file  Food Insecurity: Not on file  Transportation Needs: Not on file  Physical Activity: Not on file  Stress: Not on file  Social Connections: Not on file    Allergies:  Allergies  Allergen Reactions   Penicillins Other (See Comments)    Fever, vomiting Has patient had a PCN reaction causing immediate rash, facial/tongue/throat swelling, SOB or lightheadedness with hypotension: NO Has patient had a PCN reaction causing severe rash involving mucus membranes or skin necrosis: NO Has patient had a PCN reaction that required hospitalization: NO Has patient had a PCN reaction occurring within the last 10 years: NO If all of the above answers are "NO", then may proceed Childhood allergy    Objective:    Vital Signs:   Temp:  [97.7 F  (36.5 C)-98.2 F (36.8 C)] 98.1 F (36.7 C) (11/14 0817) Pulse Rate:  [66-177] 82 (11/14 0817) Resp:  [9-24] 14 (11/14 0817) BP: (79-143)/(47-96) 92/59 (11/14 0800) SpO2:  [92 %-99 %] 96 % (11/14 0817) Weight:  [51.9 kg-52 kg] 51.9 kg (11/14 0500)    Weight change: Filed Weights   09/18/22 1511 09/19/22 0500  Weight: 52 kg 51.9 kg    Intake/Output:   Intake/Output Summary (Last 24 hours) at 09/19/2022 0900 Last data filed at 09/19/2022 0200 Gross per 24 hour  Intake 96.37 ml  Output 700 ml  Net -603.63 ml      Physical Exam    General:  Well appearing. No resp difficulty HEENT: normal Neck: supple. JVP not elevated. Carotids 2+ bilat; no bruits. No lymphadenopathy or thyromegaly appreciated. Cor: PMI   nondisplaced. Regular rate & rhythm. No rubs, gallops or murmurs. Lungs: clear Abdomen: soft, nontender, nondistended. No hepatosplenomegaly. No bruits or masses. Good bowel sounds. Extremities: no cyanosis, clubbing, rash, edema Neuro: alert & orientedx3, cranial nerves grossly intact. moves all 4 extremities w/o difficulty. Affect pleasant   Telemetry   NSR, personally reviewed  EKG    NSR, LBBB 144 msec, inferior STE is decreased compared to yesterday (personally reviewed)  Labs   Basic Metabolic Panel: Recent Labs  Lab 09/18/22 1513 09/18/22 1843 09/19/22 0353  NA 138  --  141  K 3.0*  --  4.2  CL 105  --  113*  CO2 24  --  23  GLUCOSE 136*  --  98  BUN 10  --  12  CREATININE 0.68  --  0.73  CALCIUM 8.9  --  8.6*  MG  --  1.8  --     Liver Function Tests: Recent Labs  Lab 09/18/22 1513  AST 18  ALT 16  ALKPHOS 69  BILITOT 0.1*  PROT 6.2*  ALBUMIN 3.0*   No results for input(s): "LIPASE", "AMYLASE" in the last 168 hours. No results for input(s): "AMMONIA" in the last 168 hours.  CBC: Recent Labs  Lab 09/18/22 1513 09/19/22 0353  WBC 12.3* 7.6  HGB 13.3 11.3*  HCT 39.9 35.2*  MCV 87.3 90.0  PLT 256 233    Cardiac Enzymes: No  results for input(s): "CKTOTAL", "CKMB", "CKMBINDEX", "TROPONINI" in the last 168 hours.  BNP: BNP (last 3 results) Recent Labs    09/18/22 1843  BNP 439.3*    ProBNP (last 3 results) No results for input(s): "PROBNP" in the last 8760 hours.   CBG: No results for input(s): "GLUCAP" in the last 168 hours.  Coagulation Studies: Recent Labs    09/18/22 1513  LABPROT 12.9  INR 1.0     Imaging   Portable chest x-ray 1 view  Result Date: 09/18/2022 CLINICAL DATA:  Myocardial infarction EXAM: PORTABLE CHEST 1 VIEW COMPARISON:  09/24/2015 FINDINGS: 2 frontal views of the chest demonstrate a stable cardiac silhouette. No acute airspace disease, effusion, or pneumothorax. No acute bony abnormalities. IMPRESSION: 1. No acute intrathoracic process. Electronically Signed   By: Randa Ngo M.D.   On: 09/18/2022 20:32   CARDIAC CATHETERIZATION  Result Date: 09/18/2022 Images from the original result were not included.   Ost LM to Dist LM lesion is 50% stenosed.   Prox RCA to Dist RCA lesion is 90% stenosed.   RPDA lesion is 99% stenosed.   RPAV lesion is 95% stenosed.   A drug-eluting stent was successfully placed.   A drug-eluting stent was successfully placed.   Balloon angioplasty was performed.   Post intervention, there is a 0% residual stenosis.   Post intervention, there is a 0% residual stenosis.   Post intervention, there is a 0% residual stenosis.   There is severe left ventricular systolic dysfunction.   LV end diastolic pressure is moderately elevated.   The left ventricular ejection fraction is less than 25% by visual estimate. Whitney May is a 61 y.o. female  [id number] LOCATION:  FACILITY: [facility] PHYSICIAN: Quay Burow, M.D. Jul 27, 1961 DATE OF PROCEDURE:  09/18/2022 DATE OF DISCHARGE: CARDIAC CATHETERIZATION / PCI DES RCA History obtained from chart review.  62 year old Caucasian female with history of prior CAD status post remote RCA stenting.  She had restenting of RCA by  Dr. Burt Knack in 2020 in the setting of acute stent thrombosis.  EF  was in the 30% range.  She continues to smoke.  She apparently ran out of her clopidogrel.  She developed chest pain an hour prior to arrival.  She has low bundle-branch block and had more severe inferior ST segment elevation.  She was brought urgently to the Cath Lab for angiography and intervention. PROCEDURE DESCRIPTION: The patient was brought to the second floor [facility] cath lab in the postabsorptive state.  She was not premedicated .  Her right groin was prepped and shaved in usual sterile fashion. Xylocaine 1% was used for local anesthesia. A 6 French sheath was inserted into the right common femoral artery using standard Seldinger technique.  A 5 French JL 4 diagnostic catheter and a 6 French JR4 guide catheter used for selective coronary angiography and left ventriculography expectedly.  Isovue dye is used for the entirety of the case (118 cc of contrast totaled the patient).  Retrograde aortic, ventricular and pullback pressures were recorded. The patient received 9000 units of heparin IV with an ACT of 323 to begin with and final ACT of 233 after which she received an additional 3000's of heparin.  She received Aggrastat bolus and infusion.  She also received Brilinta 180 mg p.o. I crossed the ostium of the PDA through the previous placed stent struts and diet predilated with a 2 mm balloon.  Then placed a 2.5 x 8 mm Synergy drug-eluting stent in the ostium of the PDA and deployed at 14 atm resulting in reduction of a 99% stenosis to 0% residual.  Following this I wired the PLA branch and was unable to pass a stent and therefore predilated with a 1.5 x 6 mm Terumo balloon and then was able to pass a 2.5 x 8 mm long Synergy drug-eluting stent and deployed at the ostium of the PLA at 14 to 16 atm.  Following this I dilated the entire previously stented mid and distal RCA with a 3.5 x 15 mm noncompliant balloon resulting in reduction  of a 95% stenosis to less than 20% residual.  Patient tolerated procedure well.   Successful stenting of the ostium of the PLA and PDA branches and PTCA of the entire previously stented RCA in the setting of inferior STEMI.  The patient had apparently stopped her clopidogrel several days prior to admission.  The pain began an hour prior to transfer.  We talked about the importance of smoking cessation.  Her EF is in the 20% range and she will need to be aggressively treated by the advanced heart failure service.  Aggrastat will continue for 6 hours total.  The sheath will be removed once ACT falls below 170 and pressure held.  The patient left lab stable condition. Jonathan Berry. MD, FACC 09/18/2022 4:31 PM      Medications:     Current Medications:  aspirin EC  81 mg Oral Daily   carvedilol  3.125 mg Oral BID   Chlorhexidine Gluconate Cloth  6 each Topical Daily   levothyroxine  75 mcg Oral Q0600   nicotine  14 mg Transdermal Daily   rosuvastatin  40 mg Oral Daily   sodium chloride flush  3 mL Intravenous Q12H   ticagrelor  90 mg Oral BID    Infusions:  sodium chloride        Patient Profile   Whitney May is a 61 y.o. female with hx of CAD with NSTEMI in 19' with stent to RCA and then in 20' NSTEMI with plaque rupture of RCA   in-stent with 2 overlying stents to RCA, hypothyroidism, HLD, PAD, tobacco use, ETOH and ischemic cardiomyopathy EF 30-35% in 20' who is being seen 09/18/2022 for the evaluation of STEMI.   Assessment/Plan   1. CAD: H/o NSTEMI involving RCA in 2019 and 2020.  Ran out of Plavix, presented with inferior STEMI.  She had successful stenting of the ostium of the PLV and PDA branches and PTCA of the entire previously stented RCA.  No further chest pain, inferior STE is decreased.  - Continue ASA + ticagrelor, after 1 year ticagrelor will need to transition to long-term Plavix.  - Crestor 40 mg daily.  2. Chronic systolic CHF: Ischemic cardiomyopathy.  Echo in 5/20 with  EF 30-35%. LV-gram at time of cath yesterday showed EF down to 20%. She was not on HF meds at home. On exam todays, she does not look volume overloaded.  - Continue low dose Coreg 3.125 mg bid.  - Add spironolactone 12.5 daily.  - Add Farxiga 10 mg daily.  - Does not appear to need Lasix.  - Will add Entresto if BP remains stable.  - Echo today.  - Has LBBB, may qualify for CRT-D device.  Will need echo 3 months post-PCI to decide on device implantation.  - Will need to consider Lifevest but insurance may be an issue.  3. Hypothyroidism: She is on Levoxyl but TSH < 0.010.  - Resend TSH.  4. Smoking: Has nicotine patches, strongly encouraged to quit.  5. PAD: History of long occlusion left SFA.  Has calf claudication with long distance walking.  6. SDOH: Lost job, will be losing insurance.  Need social work help, will likely need Medicaid.   Length of Stay: 1  Dalton McLean 09/19/2022 9:41 AM  Advanced Heart Failure Team Pager [phone] (M-F; 7a - 5p)  Please contact [facility] for night-coverage after hours (4p -7a ) and weekends on [email or web address]  

## 2022-09-19 NOTE — Progress Notes (Signed)
CARDIAC REHAB PHASE I   PRE:  Rate/Rhythm: 78 SR  BP:  Supine: 102/68 Sitting:   Standing:    SaO2:   MODE:  Ambulation:  ft   POST:  Rate/Rhythem:   BP:  Supine:   Sitting:   Standing:    SaO2:   [id number] Patient eating lunch and getting ready to be transferred, no ambulation at this time. MI/stent education completed with patient including restrictions, Brilinta use, CP, NTG, and calling 911, tobacco cessation, risk factor modification, heart healthy diet, and activity progression. MI book and handouts given. Discussed phase 2 cardiac rehab with patient, and program at [facility] is close than [facility]. Referral sent to [facility]. Patient verbalizes understanding of information given.  Artist Pais, MS, ACSM CEP

## 2022-09-19 NOTE — Progress Notes (Signed)
Report called to RN on 3E, pt alert and oriented and aware of the transfer. VS wnl at time of transfer, pt transported by RN on telemetry, and room air.

## 2022-09-19 NOTE — Progress Notes (Addendum)
Patient with ischemic cardiomyopathy. EF <20%. Will need Lifevest at discharge, orders placed.   Brynda Peon, AGACNP-BC Advanced Heart Failure Team

## 2022-09-19 NOTE — Progress Notes (Incomplete)
Echocardiogram 2D Echocardiogram has been performed.  Whitney May 09/19/2022, 11:56 AM

## 2022-09-19 NOTE — Plan of Care (Signed)
  Problem: Education: Goal: Knowledge of General Education information will improve Description: Including pain rating scale, medication(s)/side effects and non-pharmacologic comfort measures Outcome: Progressing   Problem: Health Behavior/Discharge Planning: Goal: Ability to manage health-related needs will improve Outcome: Progressing   Problem: Clinical Measurements: Goal: Cardiovascular complication will be avoided Outcome: Progressing   Problem: Activity: Goal: Risk for activity intolerance will decrease Outcome: Progressing   Problem: Coping: Goal: Level of anxiety will decrease Outcome: Progressing   

## 2022-09-19 NOTE — TOC CM/SW Note (Signed)
HF TOC CM recevied referral for Life Vest. Faxed referral to [employer]. Contacted [facility] rep, with new referral. Isidoro Donning RN3 CCM, Heart Failure TOC CM [phone]

## 2022-09-19 NOTE — Progress Notes (Signed)
Heart Failure Navigator Progress Note  Assessed for Heart & Vascular TOC clinic readiness.  Patient does not meet criteria due to Advanced Heart Failure Team patient of Dr. McLean.     Whitney May, BSN, RN Heart Failure Nurse Navigator Secure Chat Only   

## 2022-09-19 NOTE — Progress Notes (Signed)
CARDIAC REHAB PHASE I   Came to walk pt. Pt did not want to walk due to feeling tired. Tried to start education w/ pt and pt started to fall asleep. Told pt someone from Cardiac Rehab team will come later to walk and possibly educate. Will continue to follow.  [phone] Joya San, MS, ACSM-CEP 09/19/2022 8:32 AM

## 2022-09-20 ENCOUNTER — Other Ambulatory Visit (HOSPITAL_COMMUNITY): Payer: Self-pay

## 2022-09-20 LAB — BASIC METABOLIC PANEL
Anion gap: 8 (ref 5–15)
BUN: 13 mg/dL (ref 6–20)
CO2: 20 mmol/L — ABNORMAL LOW (ref 22–32)
Calcium: 8.8 mg/dL — ABNORMAL LOW (ref 8.9–10.3)
Chloride: 108 mmol/L (ref 98–111)
Creatinine, Ser: 0.66 mg/dL (ref 0.44–1.00)
GFR, Estimated: >60 mL/min (ref >60)
Glucose, Bld: 104 mg/dL — ABNORMAL HIGH (ref 70–99)
Potassium: 4.1 mmol/L (ref 3.5–5.1)
Sodium: 136 mmol/L (ref 135–145)

## 2022-09-20 LAB — LIPOPROTEIN A (LPA): Lipoprotein (a): 9.1 nmol/L (ref <75.0)

## 2022-09-20 MED ORDER — LOSARTAN POTASSIUM 25 MG PO TABS
12.5000 mg | ORAL_TABLET | Freq: Every day | ORAL | 5 refills | Status: DC
  Filled 2022-09-20: qty 15, 30d supply, fill #0

## 2022-09-20 MED ORDER — LEVOTHYROXINE SODIUM 25 MCG PO TABS
25.0000 ug | ORAL_TABLET | Freq: Every day | ORAL | 1 refills | Status: DC
  Filled 2022-09-20: qty 30, 30d supply, fill #0

## 2022-09-20 MED ORDER — CARVEDILOL 3.125 MG PO TABS
3.1250 mg | ORAL_TABLET | Freq: Two times a day (BID) | ORAL | 0 refills | Status: DC
  Filled 2022-09-20: qty 60, 30d supply, fill #0

## 2022-09-20 MED ORDER — LOSARTAN POTASSIUM 25 MG PO TABS
12.5000 mg | ORAL_TABLET | Freq: Every day | ORAL | Status: DC
  Administered 2022-09-20: 12.5 mg via ORAL
  Filled 2022-09-20: qty 1

## 2022-09-20 MED ORDER — TICAGRELOR 90 MG PO TABS
90.0000 mg | ORAL_TABLET | Freq: Two times a day (BID) | ORAL | 5 refills | Status: DC
  Filled 2022-09-20: qty 60, 30d supply, fill #0

## 2022-09-20 MED ORDER — SPIRONOLACTONE 25 MG PO TABS
25.0000 mg | ORAL_TABLET | Freq: Every day | ORAL | Status: DC
  Administered 2022-09-20: 25 mg via ORAL
  Filled 2022-09-20: qty 1

## 2022-09-20 MED ORDER — SPIRONOLACTONE 25 MG PO TABS
25.0000 mg | ORAL_TABLET | Freq: Every day | ORAL | 5 refills | Status: DC
  Filled 2022-09-20: qty 30, 30d supply, fill #0

## 2022-09-20 MED ORDER — LEVOTHYROXINE SODIUM 25 MCG PO TABS: 25.0000 ug | ORAL_TABLET | Freq: Every day | ORAL | Status: DC

## 2022-09-20 MED ORDER — DAPAGLIFLOZIN PROPANEDIOL 10 MG PO TABS
10.0000 mg | ORAL_TABLET | Freq: Every day | ORAL | 5 refills | Status: DC
  Filled 2022-09-20: qty 30, 30d supply, fill #0

## 2022-09-20 MED ORDER — NICOTINE 14 MG/24HR TD PT24
14.0000 mg | MEDICATED_PATCH | Freq: Every day | TRANSDERMAL | 1 refills | Status: DC
  Filled 2022-09-20: qty 28, 28d supply, fill #0

## 2022-09-20 MED ORDER — NITROGLYCERIN 0.4 MG SL SUBL
0.4000 mg | SUBLINGUAL_TABLET | SUBLINGUAL | 12 refills | Status: DC | PRN
  Filled 2022-09-20: qty 25, 7d supply, fill #0

## 2022-09-20 NOTE — Progress Notes (Addendum)
Advanced Heart Failure Rounding Note  PCP-Cardiologist: None   Subjective:    Echo 11/14, EF 20-25%, mod eccentric LVH, severe hypokinesis of the LV entire lateral wall. Normal RV function and size. Mild MR, trivial TR.   Walked to bathroom, now setup for breakfast. Feels good this morning, denies CP and SOB. Encouraged to ambulate. Denies dizziness.   Objective:   Weight Range: 50.2 kg Body mass index is 20.25 kg/m.   Vital Signs:   Temp:  [97.1 F (36.2 C)-98.1 F (36.7 C)] 97.1 F (36.2 C) (11/15 0400) Pulse Rate:  [66-82] 75 (11/15 0003) Resp:  [14-20] 16 (11/14 2005) BP: (92-97)/(59-67) 94/67 (11/15 0003) SpO2:  [94 %-98 %] 98 % (11/15 0003) Weight:  [50.2 kg] 50.2 kg (11/15 0003) Last BM Date : 09/19/22  Weight change: Filed Weights   09/18/22 1511 09/19/22 0500 09/20/22 0003  Weight: 52 kg 51.9 kg 50.2 kg    Intake/Output:   Intake/Output Summary (Last 24 hours) at 09/20/2022 0753 Last data filed at 09/20/2022 0007 Gross per 24 hour  Intake 1677 ml  Output --  Net 1677 ml     Physical Exam    General:  well appearing.  No respiratory difficulty HEENT: normal Neck: supple. No JVD. Carotids 2+ bilat; no bruits. No lymphadenopathy or thyromegaly appreciated. Cor: PMI nondisplaced. Regular rate & rhythm. No rubs, gallops or murmurs. Lungs: clear Abdomen: soft, nontender, nondistended. No hepatosplenomegaly. No bruits or masses. Good bowel sounds. Extremities: no cyanosis, clubbing, rash, edema  Neuro: alert & oriented x 3, cranial nerves grossly intact. moves all 4 extremities w/o difficulty. Affect pleasant.   Telemetry   NSR 80s (Personally reviewed)    EKG    Sinus rhythm with 1st degree A-V block 77bpm Left axis deviation Left bundle branch block  Labs    CBC Recent Labs    09/18/22 1513 09/19/22 0353  WBC 12.3* 7.6  HGB 13.3 11.3*  HCT 39.9 35.2*  MCV 87.3 90.0  PLT 256 233   Basic Metabolic Panel Recent Labs     09/18/22 1843 09/19/22 0353 09/20/22 0039  NA  --  141 136  K  --  4.2 4.1  CL  --  113* 108  CO2  --  23 20*  GLUCOSE  --  98 104*  BUN  --  12 13  CREATININE  --  0.73 0.66  CALCIUM  --  8.6* 8.8*  MG 1.8  --   --    Liver Function Tests Recent Labs    09/18/22 1513  AST 18  ALT 16  ALKPHOS 69  BILITOT 0.1*  PROT 6.2*  ALBUMIN 3.0*   No results for input(s): "LIPASE", "AMYLASE" in the last 72 hours. Cardiac Enzymes No results for input(s): "CKTOTAL", "CKMB", "CKMBINDEX", "TROPONINI" in the last 72 hours.  BNP: BNP (last 3 results) Recent Labs    09/18/22 1843  BNP 439.3*    ProBNP (last 3 results) No results for input(s): "PROBNP" in the last 8760 hours.   D-Dimer No results for input(s): "DDIMER" in the last 72 hours. Hemoglobin A1C Recent Labs    09/18/22 1513  HGBA1C 5.3   Fasting Lipid Panel Recent Labs    09/18/22 1513  CHOL 132  HDL 38*  LDLCALC 69  TRIG 191  CHOLHDL 3.5   Thyroid Function Tests Recent Labs    09/19/22 0351  TSH <0.010*    Other results:   Imaging    ECHOCARDIOGRAM COMPLETE  Result  Date: 09/19/2022    ECHOCARDIOGRAM REPORT   Patient Name:   SHANESHA BEDNARZ Date of Exam: 09/19/2022 Medical Rec #:  [id number]      Height:       62.0 in Accession #:    [id number]     Weight:       114.4 lb Date of Birth:  19-May-1961      BSA:          1.508 m Patient Age:    61 years       BP:           94/64 mmHg Patient Gender: F              HR:           62 bpm. Exam Location:  Inpatient Procedure: 2D Echo, Color Doppler and Cardiac Doppler Indications:    CAD Native Vessel I25.10  History:        Patient has prior history of Echocardiogram examinations, most                 recent 03/20/2019. Cardiomyopathy, Pericardial Disease, Previous                 Myocardial Infarction and CAD, Signs/Symptoms:Chest Pain and                 Shortness of Breath; Risk Factors:Dyslipidemia and Current                 Smoker.  Sonographer:    Lucendia Herrlich Referring Phys: [phone] JONATHAN J BERRY IMPRESSIONS  1. Left ventricular ejection fraction, by estimation, is 20 to 25%. The left ventricle has severely decreased function. The left ventricle demonstrates regional wall motion abnormalities (see scoring diagram/findings for description). The left ventricular internal cavity size was mildly to moderately dilated. There is moderate eccentric left ventricular hypertrophy. Left ventricular diastolic parameters are indeterminate. There is severe hypokinesis of the left ventricular, entire septal wall,  anterior wall and inferior wall. There is moderate hypokinesis of the left ventricular, entire lateral wall.  2. Right ventricular systolic function is normal. The right ventricular size is normal.  3. The mitral valve is normal in structure. Mild mitral valve regurgitation. No evidence of mitral stenosis.  4. The aortic valve is grossly normal. There is mild calcification of the aortic valve. Aortic valve regurgitation is not visualized. Aortic valve sclerosis is present, with no evidence of aortic valve stenosis.  5. The inferior vena cava is normal in size with greater than 50% respiratory variability, suggesting right atrial pressure of 3 mmHg. Comparison(s): Changes from prior study are noted. EF reduced further compared to prior study. Conclusion(s)/Recommendation(s): Severely reduced LVEF, with prominent dyssynchrony. No LV thrombus seen but echo contrast not used. FINDINGS  Left Ventricle: Left ventricular ejection fraction, by estimation, is 20 to 25%. The left ventricle has severely decreased function. The left ventricle demonstrates regional wall motion abnormalities. Severe hypokinesis of the left ventricular, entire septal wall, anterior wall and inferior wall. Moderate hypokinesis of the left ventricular, entire lateral wall. The left ventricular internal cavity size was mildly to moderately dilated. There is moderate eccentric left ventricular  hypertrophy. Abnormal (paradoxical) septal motion, consistent with left bundle branch block. Left ventricular diastolic parameters are indeterminate. Right Ventricle: The right ventricular size is normal. No increase in right ventricular wall thickness. Right ventricular systolic function is normal. Left Atrium: Left atrial size was normal in size. Right Atrium: Right atrial size was normal in  size. Pericardium: There is no evidence of pericardial effusion. Mitral Valve: The mitral valve is normal in structure. Mild mitral valve regurgitation. No evidence of mitral valve stenosis. Tricuspid Valve: The tricuspid valve is grossly normal. Tricuspid valve regurgitation is trivial. No evidence of tricuspid stenosis. Aortic Valve: The aortic valve is grossly normal. There is mild calcification of the aortic valve. Aortic valve regurgitation is not visualized. Aortic valve sclerosis is present, with no evidence of aortic valve stenosis. Aortic valve mean gradient measures 3.0 mmHg. Aortic valve peak gradient measures 6.4 mmHg. Aortic valve area, by VTI measures 2.62 cm. Pulmonic Valve: The pulmonic valve was not well visualized. Pulmonic valve regurgitation is not visualized. Aorta: The aortic root, ascending aorta, aortic arch and descending aorta are all structurally normal, with no evidence of dilitation or obstruction. Venous: The inferior vena cava is normal in size with greater than 50% respiratory variability, suggesting right atrial pressure of 3 mmHg. IAS/Shunts: The atrial septum is grossly normal.  LEFT VENTRICLE PLAX 2D LVIDd:         5.70 cm   Diastology LVIDs:         4.90 cm   LV e' medial:   4.22 cm/s LV PW:         1.50 cm   LV E/e' medial: 13.8 LV IVS:        1.00 cm LVOT diam:     2.00 cm LV SV:         53 LV SV Index:   35 LVOT Area:     3.14 cm  RIGHT VENTRICLE            IVC RV S prime:     9.64 cm/s  IVC diam: 1.50 cm TAPSE (M-mode): 1.6 cm LEFT ATRIUM             Index        RIGHT ATRIUM           Index LA diam:        2.60 cm 1.72 cm/m   RA Area:     6.99 cm LA Vol (A2C):   15.8 ml 10.48 ml/m  RA Volume:   10.30 ml 6.83 ml/m LA Vol (A4C):   19.5 ml 12.90 ml/m LA Biplane Vol: 22.6 ml 14.99 ml/m  AORTIC VALVE AV Area (Vmax):    2.31 cm AV Area (Vmean):   2.61 cm AV Area (VTI):     2.62 cm AV Vmax:           126.00 cm/s AV Vmean:          68.300 cm/s AV VTI:            0.203 m AV Peak Grad:      6.4 mmHg AV Mean Grad:      3.0 mmHg LVOT Vmax:         92.50 cm/s LVOT Vmean:        56.800 cm/s LVOT VTI:          0.169 m LVOT/AV VTI ratio: 0.83  AORTA Ao Root diam: 3.00 cm Ao Asc diam:  3.20 cm MITRAL VALVE MV Area (PHT): 4.89 cm    SHUNTS MV Decel Time: 155 msec    Systemic VTI:  0.17 m MV E velocity: 58.20 cm/s  Systemic Diam: 2.00 cm MV A velocity: 64.50 cm/s MV E/A ratio:  0.90 Jodelle Red MD Electronically signed by Jodelle Red MD Signature Date/Time: 09/19/2022/2:39:56 PM    Final  Medications:     Scheduled Medications:  aspirin EC  81 mg Oral Daily   carvedilol  3.125 mg Oral BID   Chlorhexidine Gluconate Cloth  6 each Topical Daily   dapagliflozin propanediol  10 mg Oral Daily   levothyroxine  75 mcg Oral Q0600   nicotine  14 mg Transdermal Daily   rosuvastatin  40 mg Oral Daily   sodium chloride flush  3 mL Intravenous Q12H   spironolactone  12.5 mg Oral Daily   ticagrelor  90 mg Oral BID    Infusions:  sodium chloride      PRN Medications: sodium chloride, acetaminophen, ALPRAZolam, levalbuterol, morphine injection, nitroGLYCERIN, ondansetron (ZOFRAN) IV, sodium chloride flush    Patient Profile  Whitney May is a 61 y.o. female with hx of CAD with NSTEMI in 39' with stent to RCA and then in 20' NSTEMI with plaque rupture of RCA in-stent with 2 overlying stents to RCA, hypothyroidism, HLD, PAD, tobacco use, ETOH and ischemic cardiomyopathy EF 30-35% in 20' who is being seen 09/18/2022 for the evaluation of STEMI.   Assessment/Plan   1. CAD:  H/o NSTEMI involving RCA in 2019 and 2020.  Ran out of Plavix, presented with inferior STEMI.  She had successful stenting of the ostium of the PLV and PDA branches and PTCA of the entire previously stented RCA.  No further chest pain, inferior STE is decreased.  - Continue ASA + ticagrelor, after 1 year ticagrelor will need to transition to long-term Plavix.  - Crestor 40 mg daily.  2. Chronic systolic CHF: Ischemic cardiomyopathy.  Echo in 5/20 with EF 30-35%. LV-gram at time of cath yesterday showed EF down to 20%. She was not on HF meds at home. On exam today, she does not look volume overloaded.  - Continue low dose Coreg 3.125 mg bid.  - Increase spironolactone to 25 daily.  - Continue Farxiga 10 mg daily.  - Does not appear to need Lasix - Will add Entresto if BP remains stable, currently too soft => will start low dose losartan 12.5 qhs.  - Echo 11/14, EF 20-25%, mod eccentric LVH, severe hypokinesis of the LV entire lateral wall. Normal RV function and size. Mild MR, trivial TR.  - Has LBBB, may qualify for CRT-D device.  Will need echo 3 months post-PCI to decide on device implantation.  - Will need lifevest at discharge, orders placed.  3. Hypothyroidism: She is on Levoxyl but TSH still < 0.010.  - Decrease Levoxyl to 25 mcg daily, f/u with PCP.  4. Smoking: Has nicotine patches, strongly encouraged to quit.  5. PAD: History of long occlusion left SFA.  Has calf claudication with long distance walking.  6. SDOH: Lost job, will be losing insurance.  Need social work help, will likely need Medicaid.   Can possibly d/c after getting her lifevest, will need meds from TOC.  Length of Stay: 2  Alen Bleacher, AGACNP-BC  09/20/2022, 7:53 AM  Advanced Heart Failure Team Pager [phone] (M-F; 7a - 5p)  Please contact [facility] for night-coverage after hours (5p -7a ) and weekends on [email or web address]  Patient seen with NP, agree with the above note .  No chest pain or dyspnea.  No  lightheadedness, SBP running around 100.  Echo with EF 20-25%, moderate LV enlargement, dyssynchrony, no LV thrombus.   General: NAD, thin.  Neck: No JVD, no thyromegaly or thyroid nodule.  Lungs: Clear to auscultation bilaterally with normal respiratory effort. CV: Nondisplaced PMI.  Heart regular S1/S2, no S3/S4, no murmur.  No peripheral edema.    Abdomen: Soft, nontender, no hepatosplenomegaly, no distention.  Skin: Intact without lesions or rashes.  Neurologic: Alert and oriented x 3.  Psych: Normal affect. Extremities: No clubbing or cyanosis.  HEENT: Normal.   Patient is stable post MI, no further chest pain.  She has an ischemic cardiomyopathy with EF 20-25% but looks euvolemic on exam.   - BP too soft for Entresto, will use losartan 12.5 daily.  - Continue Farxiga 10 daily, spironolactone 25 daily, Coreg 3.125 mg bid.  - She does not need a diuretic.  - Home with Lifevest, repeat echo in 3 months to decide on need for CRT-D (LBBB).   Needs to quit smoking, will send home with nicotine patches.   S/p PCI RCA/PDA/PLV, home with ASA + ticagrelor + statin.   Will need help with meds and will need close CHF clinic followup.  Has lost job, needs to apply for disability and Medicaid.  Will need social work help. Meds for home: ticagrelor 90 bid, ASA 81, Crestor 40, losartan 12.5, spironolactone 25, Farxiga 10, Coreg 3.125 mg bid, nicotine patches.  Decrease Levoxyl to 25 mcg daily and followup with PCP (TSH low).   Marca Ancona 09/20/2022 8:36 AM

## 2022-09-20 NOTE — Progress Notes (Signed)
CARDIAC REHAB PHASE I   PRE:  Rate/Rhythm: 70 SR  BP:  Sitting: 95/62      SaO2: 99 RA   MODE:  Ambulation: 470 ft   POST:  Rate/Rhythm: 82 SR   BP:  Sitting: 104/67      SaO2: 98 RA  Pt ambulated in hall independently. Tolerating well with no sob or cp. Returned to bed with call bell and bedside table in reach. No questions or concerns regarding education provided yesterday. Plan for home tomorrow.    [id number]  Woodroe Chen, RN BSN 09/20/2022 11:40 AM

## 2022-09-20 NOTE — Progress Notes (Signed)
CSW is notified by RN that pt wanting to speak with a Education officer, museum. CSW met with pt bedside. She states she just lost her job and that the doctor here told her she needs to be on disability. CSW agreed to send referral to financial counseling to be screened and explained the applications are processed by DSS and social security. She states she needs a list of follow up doctors she will be seeing; CSW explained list of follow up offices will be on her DC paperwork.   CSW emailed referral to financial counseling for medicaid/disability screening.

## 2022-09-20 NOTE — TOC Transition Note (Signed)
Transition of Care [facility]) - CM/SW Discharge Note   Patient Details  Name: Whitney May MRN: [id number] Date of Birth: 05-Jul-1961  Transition of Care [facility]) CM/SW Contact:  Leone Haven, RN Phone Number: 09/20/2022, 11:44 AM   Clinical Narrative:    Patient is for dc today, Dennis Bast wil be fitting patient , she was approved for the Life Vest.          Patient Goals and CMS Choice        Discharge Placement                       Discharge Plan and Services                                     Social Determinants of Health (SDOH) Interventions     Readmission Risk Interventions     No data to display

## 2022-09-20 NOTE — Research (Signed)
[place] Lung trial Informed Consent   Subject Name: Whitney May  Subject met inclusion and exclusion criteria.  The informed consent form, study requirements and expectations were reviewed with the subject and questions and concerns were addressed prior to the signing of the consent form.  The subject verbalized understanding of the trail requirements.  The subject agreed to participate in the [facility] lung trial trial and signed the informed consent.  The informed consent was obtained prior to performance of any protocol-specific procedures for the subject.  A copy of the signed informed consent was given to the subject and a copy was placed in the subject's medical record.  Berneda Rose 09/20/2022, 4:12 PM  Lung scans complete

## 2022-09-20 NOTE — Discharge Summary (Signed)
Advanced Heart Failure Team  Discharge Summary   Patient ID: KENZLEE FISHBURN MRN: [id number], DOB/AGE: June 21, 1961 61 y.o. Admit date: 09/18/2022 D/C date:     09/20/2022   Primary Discharge Diagnoses:  CAD STEMI  Secondary Discharge Diagnoses:  Chronic systolic CHF, ICM Hypothyroidism Smoking PAD  Hospital Course:  Ms. Faeth is a 61 y.o. female with hx of CAD with NSTEMI in 16' with stent to RCA and then in 20' NSTEMI with plaque rupture of RCA in-stent with 2 overlying stents to RCA, hypothyroidism, HLD, PAD, tobacco use, ETOH and ischemic cardiomyopathy EF 30-35% in 20' who presented 09/18/2022 w/ STEMI w/ ischemic cardiomyopathy.     Ms. Jaster presented to the ED with 1 hr of chest pain. Had run out of plavix for several days. EKG showed LBBB and prominent inferior ST elevation. Was take for an emergent cath for inferior STEMI with 90% ISR mid-distal RCA, 99% stenosis ostial PDA and 95% stenosis ostial PLV. Patient had PTCA to the long mid-distal RCA stented segment and DES to ostial PLV and PDA. LV-gram showed EF in 20% range.   Has LBBB, may qualify for CRT-D device. Will need echo 3 months post-PCI to decide on device implantation. Will go home with lifevest. Will continue ASA and ticagrelor for 1 year with transition to long-term Plavix then. Denies CP and SOB.   Will need medication assistance at follow-up. Insurance being lost soon.  Pt will continue to be followed closely in the AHF clinic. Dr Aundra Dubin evaluated and deemed appropriate for discharge. F/u scheduled.   See below for detailed problem list: 1. CAD: H/o NSTEMI involving RCA in 2019 and 2020.  Ran out of Plavix, presented with inferior STEMI.  She had successful stenting of the ostium of the PLV and PDA branches and PTCA of the entire previously stented RCA. No further chest pain, inferior STE is decreased.  - Continue ASA + ticagrelor, after 1 year ticagrelor will need to transition to long-term Plavix.  - Crestor 40 mg  daily.  2. Chronic systolic CHF: Ischemic cardiomyopathy.  Echo in 5/20 with EF 30-35%. LV-gram at time of cath yesterday showed EF down to 20%. She was not on HF meds at home. Not volume overloaded on exam - Continue low dose Coreg 3.125 mg bid.  - Increase spironolactone to 25 daily.  - Continue Farxiga 10 mg daily.  - Does not appear to need Lasix - Will add Entresto if BP remains stable, currently too soft => will start low dose losartan 12.5 qhs.  - Echo 11/14, EF 20-25%, mod eccentric LVH, severe hypokinesis of the LV entire lateral wall. Normal RV function and size. Mild MR, trivial TR.  - Has LBBB, may qualify for CRT-D device.  Will need echo 3 months post-PCI to decide on device implantation.  - Lifevest at discharge, insurance approved 3. Hypothyroidism: She is on Levoxyl but TSH still < 0.010.  - Decrease Levoxyl to 25 mcg daily, f/u with PCP.  4. Smoking: Has nicotine patches, strongly encouraged to quit.  5. PAD: History of long occlusion left SFA.  Has calf claudication with long distance walking.  6. SDOH: Lost job, will be losing insurance.  Need social work help, will likely need Medicaid.   Discharge Weight Range: 50.2 kg  Discharge Vitals: Blood pressure 95/62, pulse 70, temperature 97.7 F (36.5 C), temperature source Oral, resp. rate 18, weight 50.2 kg, SpO2 99 %.  Labs: Lab Results  Component Value Date   WBC 7.6 09/19/2022  HGB 11.3 (L) 09/19/2022   HCT 35.2 (L) 09/19/2022   MCV 90.0 09/19/2022   PLT 233 09/19/2022    Recent Labs  Lab 09/18/22 1513 09/19/22 0353 09/20/22 0039  NA 138   < > 136  K 3.0*   < > 4.1  CL 105   < > 108  CO2 24   < > 20*  BUN 10   < > 13  CREATININE 0.68   < > 0.66  CALCIUM 8.9   < > 8.8*  PROT 6.2*  --   --   BILITOT 0.1*  --   --   ALKPHOS 69  --   --   ALT 16  --   --   AST 18  --   --   GLUCOSE 136*   < > 104*   < > = values in this interval not displayed.   Lab Results  Component Value Date   CHOL 132  09/18/2022   HDL 38 (L) 09/18/2022   LDLCALC 69 09/18/2022   TRIG 124 09/18/2022   BNP (last 3 results) Recent Labs    09/18/22 1843  BNP 439.3*    ProBNP (last 3 results) No results for input(s): "PROBNP" in the last 8760 hours.   Diagnostic Studies/Procedures   ECHOCARDIOGRAM COMPLETE  Result Date: 09/19/2022    ECHOCARDIOGRAM REPORT   Patient Name:   ALECEA TREGO Date of Exam: 09/19/2022 Medical Rec #:  [id number]      Height:       62.0 in Accession #:    [id number]     Weight:       114.4 lb Date of Birth:  May 13, 1961      BSA:          1.508 m Patient Age:    26 years       BP:           94/64 mmHg Patient Gender: F              HR:           62 bpm. Exam Location:  Inpatient Procedure: 2D Echo, Color Doppler and Cardiac Doppler Indications:    CAD Native Vessel I25.10  History:        Patient has prior history of Echocardiogram examinations, most                 recent 03/20/2019. Cardiomyopathy, Pericardial Disease, Previous                 Myocardial Infarction and CAD, Signs/Symptoms:Chest Pain and                 Shortness of Breath; Risk Factors:Dyslipidemia and Current                 Smoker.  Sonographer:    Ronny Flurry Referring Phys: Snyder  1. Left ventricular ejection fraction, by estimation, is 20 to 25%. The left ventricle has severely decreased function. The left ventricle demonstrates regional wall motion abnormalities (see scoring diagram/findings for description). The left ventricular internal cavity size was mildly to moderately dilated. There is moderate eccentric left ventricular hypertrophy. Left ventricular diastolic parameters are indeterminate. There is severe hypokinesis of the left ventricular, entire septal wall,  anterior wall and inferior wall. There is moderate hypokinesis of the left ventricular, entire lateral wall.  2. Right ventricular systolic function is normal. The right ventricular size is normal.  3. The mitral valve  is normal in structure. Mild mitral valve regurgitation. No evidence of mitral stenosis.  4. The aortic valve is grossly normal. There is mild calcification of the aortic valve. Aortic valve regurgitation is not visualized. Aortic valve sclerosis is present, with no evidence of aortic valve stenosis.  5. The inferior vena cava is normal in size with greater than 50% respiratory variability, suggesting right atrial pressure of 3 mmHg. Comparison(s): Changes from prior study are noted. EF reduced further compared to prior study. Conclusion(s)/Recommendation(s): Severely reduced LVEF, with prominent dyssynchrony. No LV thrombus seen but echo contrast not used. FINDINGS  Left Ventricle: Left ventricular ejection fraction, by estimation, is 20 to 25%. The left ventricle has severely decreased function. The left ventricle demonstrates regional wall motion abnormalities. Severe hypokinesis of the left ventricular, entire septal wall, anterior wall and inferior wall. Moderate hypokinesis of the left ventricular, entire lateral wall. The left ventricular internal cavity size was mildly to moderately dilated. There is moderate eccentric left ventricular hypertrophy. Abnormal (paradoxical) septal motion, consistent with left bundle branch block. Left ventricular diastolic parameters are indeterminate. Right Ventricle: The right ventricular size is normal. No increase in right ventricular wall thickness. Right ventricular systolic function is normal. Left Atrium: Left atrial size was normal in size. Right Atrium: Right atrial size was normal in size. Pericardium: There is no evidence of pericardial effusion. Mitral Valve: The mitral valve is normal in structure. Mild mitral valve regurgitation. No evidence of mitral valve stenosis. Tricuspid Valve: The tricuspid valve is grossly normal. Tricuspid valve regurgitation is trivial. No evidence of tricuspid stenosis. Aortic Valve: The aortic valve is grossly normal. There is mild  calcification of the aortic valve. Aortic valve regurgitation is not visualized. Aortic valve sclerosis is present, with no evidence of aortic valve stenosis. Aortic valve mean gradient measures 3.0 mmHg. Aortic valve peak gradient measures 6.4 mmHg. Aortic valve area, by VTI measures 2.62 cm. Pulmonic Valve: The pulmonic valve was not well visualized. Pulmonic valve regurgitation is not visualized. Aorta: The aortic root, ascending aorta, aortic arch and descending aorta are all structurally normal, with no evidence of dilitation or obstruction. Venous: The inferior vena cava is normal in size with greater than 50% respiratory variability, suggesting right atrial pressure of 3 mmHg. IAS/Shunts: The atrial septum is grossly normal.  LEFT VENTRICLE PLAX 2D LVIDd:         5.70 cm   Diastology LVIDs:         4.90 cm   LV e' medial:   4.22 cm/s LV PW:         1.50 cm   LV E/e' medial: 13.8 LV IVS:        1.00 cm LVOT diam:     2.00 cm LV SV:         53 LV SV Index:   35 LVOT Area:     3.14 cm  RIGHT VENTRICLE            IVC RV S prime:     9.64 cm/s  IVC diam: 1.50 cm TAPSE (M-mode): 1.6 cm LEFT ATRIUM             Index        RIGHT ATRIUM          Index LA diam:        2.60 cm 1.72 cm/m   RA Area:     6.99 cm LA Vol (A2C):   15.8 ml 10.48 ml/m  RA Volume:   10.30 ml  6.83 ml/m LA Vol (A4C):   19.5 ml 12.90 ml/m LA Biplane Vol: 22.6 ml 14.99 ml/m  AORTIC VALVE AV Area (Vmax):    2.31 cm AV Area (Vmean):   2.61 cm AV Area (VTI):     2.62 cm AV Vmax:           126.00 cm/s AV Vmean:          68.300 cm/s AV VTI:            0.203 m AV Peak Grad:      6.4 mmHg AV Mean Grad:      3.0 mmHg LVOT Vmax:         92.50 cm/s LVOT Vmean:        56.800 cm/s LVOT VTI:          0.169 m LVOT/AV VTI ratio: 0.83  AORTA Ao Root diam: 3.00 cm Ao Asc diam:  3.20 cm MITRAL VALVE MV Area (PHT): 4.89 cm    SHUNTS MV Decel Time: 155 msec    Systemic VTI:  0.17 m MV E velocity: 58.20 cm/s  Systemic Diam: 2.00 cm MV A velocity: 64.50 cm/s  MV E/A ratio:  0.90 Buford Dresser MD Electronically signed by Buford Dresser MD Signature Date/Time: 09/19/2022/2:39:56 PM    Final    Portable chest x-ray 1 view  Result Date: 09/18/2022 CLINICAL DATA:  Myocardial infarction EXAM: PORTABLE CHEST 1 VIEW COMPARISON:  09/24/2015 FINDINGS: 2 frontal views of the chest demonstrate a stable cardiac silhouette. No acute airspace disease, effusion, or pneumothorax. No acute bony abnormalities. IMPRESSION: 1. No acute intrathoracic process. Electronically Signed   By: Randa Ngo M.D.   On: 09/18/2022 20:32   CARDIAC CATHETERIZATION  Result Date: 09/18/2022 Images from the original result were not included.   Ost LM to Dist LM lesion is 50% stenosed.   Prox RCA to Dist RCA lesion is 90% stenosed.   RPDA lesion is 99% stenosed.   RPAV lesion is 95% stenosed.   A drug-eluting stent was successfully placed.   A drug-eluting stent was successfully placed.   Balloon angioplasty was performed.   Post intervention, there is a 0% residual stenosis.   Post intervention, there is a 0% residual stenosis.   Post intervention, there is a 0% residual stenosis.   There is severe left ventricular systolic dysfunction.   LV end diastolic pressure is moderately elevated.   The left ventricular ejection fraction is less than 25% by visual estimate. NOLENE ROCKS is a 61 y.o. female  [id number] LOCATION:  FACILITY: [facility] PHYSICIAN: Quay Burow, M.D. May 15, 1961 DATE OF PROCEDURE:  09/18/2022 DATE OF DISCHARGE: CARDIAC CATHETERIZATION / PCI DES RCA History obtained from chart review.  61 year old Caucasian female with history of prior CAD status post remote RCA stenting.  She had restenting of RCA by Dr. Burt Knack in 2020 in the setting of acute stent thrombosis.  EF was in the 30% range.  She continues to smoke.  She apparently ran out of her clopidogrel.  She developed chest pain an hour prior to arrival.  She has low bundle-branch block and had more severe inferior  ST segment elevation.  She was brought urgently to the Cath Lab for angiography and intervention. PROCEDURE DESCRIPTION: The patient was brought to the second floor [facility] cath lab in the postabsorptive state.  She was not premedicated .  Her right groin was prepped and shaved in usual sterile fashion. Xylocaine 1% was used for local anesthesia. A 6  French sheath was inserted into the right common femoral artery using standard Seldinger technique.  A 5 [place] JL 4 diagnostic catheter and a 6 [place] JR4 guide catheter used for selective coronary angiography and left ventriculography expectedly.  Isovue dye is used for the entirety of the case (118 cc of contrast totaled the patient).  Retrograde aortic, ventricular and pullback pressures were recorded. The patient received 9000 units of heparin IV with an ACT of 323 to begin with and final ACT of 233 after which she received an additional 3000's of heparin.  She received Aggrastat bolus and infusion.  She also received Brilinta 180 mg p.o. I crossed the ostium of the PDA through the previous placed stent struts and diet predilated with a 2 mm balloon.  Then placed a 2.5 x 8 mm Synergy drug-eluting stent in the ostium of the PDA and deployed at 14 atm resulting in reduction of a 99% stenosis to 0% residual.  Following this I wired the PLA branch and was unable to pass a stent and therefore predilated with a 1.5 x 6 mm Terumo balloon and then was able to pass a 2.5 x 8 mm long Synergy drug-eluting stent and deployed at the ostium of the PLA at 14 to 16 atm.  Following this I dilated the entire previously stented mid and distal RCA with a 3.5 x 15 mm noncompliant balloon resulting in reduction of a 95% stenosis to less than 20% residual.  Patient tolerated procedure well.   Successful stenting of the ostium of the PLA and PDA branches and PTCA of the entire previously stented RCA in the setting of inferior STEMI.  The patient had apparently stopped her  clopidogrel several days prior to admission.  The pain began an hour prior to transfer.  We talked about the importance of smoking cessation.  Her EF is in the 20% range and she will need to be aggressively treated by the advanced heart failure service.  Aggrastat will continue for 6 hours total.  The sheath will be removed once ACT falls below 170 and pressure held.  The patient left lab stable condition. Quay Burow. MD, [facility] 09/18/2022 4:31 PM     Discharge Medications   Allergies as of 09/20/2022       Reactions   Penicillins Other (See Comments)   Fever, vomiting Has patient had a PCN reaction causing immediate rash, facial/tongue/throat swelling, SOB or lightheadedness with hypotension: NO Has patient had a PCN reaction causing severe rash involving mucus membranes or skin necrosis: NO Has patient had a PCN reaction that required hospitalization: NO Has patient had a PCN reaction occurring within the last 10 years: NO If all of the above answers are "NO", then may proceed Childhood allergy        Medication List     STOP taking these medications    clopidogrel 75 MG tablet Commonly known as: PLAVIX       TAKE these medications    aspirin 81 MG chewable tablet Chew 1 tablet (81 mg total) by mouth daily.   carvedilol 3.125 MG tablet Commonly known as: COREG Take 1 tablet (3.125 mg total) by mouth 2 (two) times daily.   dapagliflozin propanediol 10 MG Tabs tablet Commonly known as: FARXIGA Take 1 tablet (10 mg total) by mouth daily. Start taking on: September 21, 2022   Fish Oil 1200 MG Caps Take 1,200 mg by mouth daily.   levothyroxine 25 MCG tablet Commonly known as: SYNTHROID Take 1 tablet (25  mcg total) by mouth daily at 6 (six) AM. Start taking on: September 21, 2022 What changed:  medication strength how much to take when to take this Another medication with the same name was removed. Continue taking this medication, and follow the directions you see  here.   losartan 25 MG tablet Commonly known as: COZAAR Take 0.5 tablets (12.5 mg total) by mouth daily. Start taking on: September 21, 2022   multivitamin with minerals Tabs tablet Take 1 tablet by mouth daily.   nicotine 14 mg/24hr patch Commonly known as: NICODERM CQ - dosed in mg/24 hours Place 1 patch (14 mg total) onto the skin daily. Start taking on: September 21, 2022   nitroGLYCERIN 0.4 MG SL tablet Commonly known as: NITROSTAT Place 1 tablet (0.4 mg total) under the tongue every 5 (five) minutes x 3 doses as needed for chest pain.   rosuvastatin 40 MG tablet Commonly known as: CRESTOR Take 1 tablet (40 mg total) by mouth daily.   spironolactone 25 MG tablet Commonly known as: ALDACTONE Take 1 tablet (25 mg total) by mouth daily. Start taking on: September 21, 2022   ticagrelor 90 MG Tabs tablet Commonly known as: BRILINTA Take 1 tablet (90 mg total) by mouth 2 (two) times daily.               Durable Medical Equipment  (From admission, onward)           Start     Ordered   09/19/22 0944  For home use only DME Vest life vest  Once       Comments: ZOLL Life Vest   Identify indication. Type yes or no  DCM with an EF < 35%:  ICD Explanation:  Other condition with high risk of VT/VF:   Life Vest Setting  VT 150 bpm VF 200 BPM 150Jx5 Length of need: 3 months  Start Date: 09/20/22   09/19/22 0945            Disposition   The patient will be discharged in stable condition to home. Discharge Instructions     Amb Referral to Cardiac Rehabilitation   Complete by: As directed    Diagnosis:  STEMI Coronary Stents     After initial evaluation and assessments completed: Virtual Based Care may be provided alone or in conjunction with Phase 2 Cardiac Rehab based on patient barriers.: Yes   Intensive Cardiac Rehabilitation (ICR) [place] location only OR Traditional Cardiac Rehabilitation (TCR) *If criteria for ICR are not met will enroll in TCR [facility]  only): Yes       Follow-up Information     [facility] Follow up on 10/06/2022.   Specialty: Cardiology Why: Follow up in the Advanced heart failure clinic 10/06/22 330pm.  Please bring all medications with you, entrance C, free valet Contact information: [address] [id number] [place] [address] [phone]                  Duration of Discharge Encounter: Greater than 35 minutes   Signed, Earnie Larsson AGACNP-BC  09/20/2022, 11:13 AM

## 2022-10-06 ENCOUNTER — Other Ambulatory Visit (HOSPITAL_COMMUNITY): Payer: Self-pay | Admitting: *Deleted

## 2022-10-06 ENCOUNTER — Encounter (HOSPITAL_COMMUNITY): Payer: Self-pay

## 2022-10-06 ENCOUNTER — Other Ambulatory Visit (HOSPITAL_COMMUNITY): Payer: Self-pay

## 2022-10-06 ENCOUNTER — Ambulatory Visit (HOSPITAL_COMMUNITY)
Admit: 2022-10-06 | Discharge: 2022-10-06 | Disposition: A | Discharge status: Home / Self Care | Payer: Self-pay | Attending: Family Medicine | Admitting: Family Medicine

## 2022-10-06 ENCOUNTER — Telehealth (HOSPITAL_COMMUNITY): Payer: Self-pay | Admitting: Pharmacy Technician

## 2022-10-06 VITALS — BP 100/70 | HR 70 | Ht 62.0 in | Wt 114.4 lb

## 2022-10-06 DIAGNOSIS — Z955 Presence of coronary angioplasty implant and graft: Secondary | ICD-10-CM | POA: Insufficient documentation

## 2022-10-06 DIAGNOSIS — Z7902 Long term (current) use of antithrombotics/antiplatelets: Secondary | ICD-10-CM | POA: Insufficient documentation

## 2022-10-06 DIAGNOSIS — I252 Old myocardial infarction: Secondary | ICD-10-CM | POA: Insufficient documentation

## 2022-10-06 DIAGNOSIS — I2510 Atherosclerotic heart disease of native coronary artery without angina pectoris: Secondary | ICD-10-CM | POA: Insufficient documentation

## 2022-10-06 DIAGNOSIS — I739 Peripheral vascular disease, unspecified: Secondary | ICD-10-CM

## 2022-10-06 DIAGNOSIS — Z79899 Other long term (current) drug therapy: Secondary | ICD-10-CM | POA: Insufficient documentation

## 2022-10-06 DIAGNOSIS — I5022 Chronic systolic (congestive) heart failure: Secondary | ICD-10-CM | POA: Insufficient documentation

## 2022-10-06 DIAGNOSIS — E039 Hypothyroidism, unspecified: Secondary | ICD-10-CM | POA: Insufficient documentation

## 2022-10-06 DIAGNOSIS — Z139 Encounter for screening, unspecified: Secondary | ICD-10-CM

## 2022-10-06 DIAGNOSIS — I959 Hypotension, unspecified: Secondary | ICD-10-CM | POA: Insufficient documentation

## 2022-10-06 DIAGNOSIS — Z79890 Hormone replacement therapy (postmenopausal): Secondary | ICD-10-CM | POA: Insufficient documentation

## 2022-10-06 DIAGNOSIS — I447 Left bundle-branch block, unspecified: Secondary | ICD-10-CM | POA: Insufficient documentation

## 2022-10-06 DIAGNOSIS — F17200 Nicotine dependence, unspecified, uncomplicated: Secondary | ICD-10-CM | POA: Insufficient documentation

## 2022-10-06 DIAGNOSIS — I255 Ischemic cardiomyopathy: Secondary | ICD-10-CM | POA: Insufficient documentation

## 2022-10-06 LAB — BASIC METABOLIC PANEL
Anion gap: 9 (ref 5–15)
BUN: 16 mg/dL (ref 6–20)
CO2: 21 mmol/L — ABNORMAL LOW (ref 22–32)
Calcium: 9.2 mg/dL (ref 8.9–10.3)
Chloride: 102 mmol/L (ref 98–111)
Creatinine, Ser: 0.83 mg/dL (ref 0.44–1.00)
GFR, Estimated: >60 mL/min (ref >60)
Glucose, Bld: 90 mg/dL (ref 70–99)
Potassium: 3.8 mmol/L (ref 3.5–5.1)
Sodium: 132 mmol/L — ABNORMAL LOW (ref 135–145)

## 2022-10-06 MED ORDER — ROSUVASTATIN CALCIUM 40 MG PO TABS
40.0000 mg | ORAL_TABLET | Freq: Every day | ORAL | 3 refills | Status: DC
  Filled 2022-10-06: qty 30, 30d supply, fill #0
  Filled 2022-11-07: qty 30, 30d supply, fill #1
  Filled 2022-11-17: qty 30, 30d supply, fill #2

## 2022-10-06 MED ORDER — SPIRONOLACTONE 25 MG PO TABS
25.0000 mg | ORAL_TABLET | Freq: Every day | ORAL | 3 refills | Status: DC
  Filled 2022-10-06: qty 30, 30d supply, fill #0
  Filled 2022-11-07 – 2022-11-08 (×2): qty 30, 30d supply, fill #1
  Filled 2022-12-19: qty 30, 30d supply, fill #0

## 2022-10-06 MED ORDER — LOSARTAN POTASSIUM 25 MG PO TABS
12.5000 mg | ORAL_TABLET | Freq: Every day | ORAL | 3 refills | Status: DC
  Filled 2022-10-06: qty 15, 30d supply, fill #0

## 2022-10-06 MED ORDER — EMPAGLIFLOZIN 10 MG PO TABS
10.0000 mg | ORAL_TABLET | Freq: Every day | ORAL | 6 refills | Status: DC
  Filled 2022-10-06: qty 30, 30d supply, fill #0

## 2022-10-06 MED ORDER — ASPIRIN 81 MG PO CHEW: 81.0000 mg | CHEWABLE_TABLET | Freq: Every day | ORAL | 3 refills | Status: DC

## 2022-10-06 MED ORDER — CARVEDILOL 3.125 MG PO TABS
3.1250 mg | ORAL_TABLET | Freq: Two times a day (BID) | ORAL | 3 refills | Status: DC
  Filled 2022-10-06: qty 60, 30d supply, fill #0
  Filled 2022-11-07: qty 60, 30d supply, fill #1
  Filled 2022-11-17: qty 60, 30d supply, fill #2

## 2022-10-06 NOTE — Patient Instructions (Addendum)
Labs today - will call you if abnormal. Return to Pharmacy Clinic in 3 weeks. Return to Heart Failure APP CLinic in 6 weeks. Return to Heart Failure Clinic and see Dr. Shirlee Latch with echo in 12 weeks. Please call our clinic if you have any questions or concerns prior to your next appointment.  Cardiac medication prescriptions sent to our [facility] and will be paid for by our Patient Fund. Complete your Marcelline Deist (one tab per day). When completed switch to Jardiance 10 mg daily; free 30 day coupon provided to patient. Please present to your local pharmacy.

## 2022-10-06 NOTE — Telephone Encounter (Signed)
Advanced Heart Failure Patient Advocate Encounter  Patient was seen in clinic today and started on Jardiance.   The patient is currently uninsured. Started an application for [facility].   Will fax in once signatures are obtained.

## 2022-10-06 NOTE — Progress Notes (Addendum)
ADVANCED HF CLINIC CONSULT NOTE  PCP: Bucio, Julian Reil, FNP HF Cardiologist: Dr. Shirlee Latch  HPI: Whitney May is a 61 y.o. female with hx of CAD with NSTEMI in 2019 with stent to RCA and then in 2020 NSTEMI with plaque rupture of RCA in-stent with 2 overlying stents to RCA, hypothyroidism, HLD, PAD, tobacco use, ETOH and ischemic cardiomyopathy EF 30-35% on 5/20 echo who is being seen 09/18/2022 for the evaluation of STEMI with ischemic cardiomyopathy. She has a history of PAD with long occlusion left SFA.    Admitted 11/23 with CP, ran out of Plavix for several days. EKG showed LBBB and prominent inferior ST elevation. She had emergent cath for inferior STEMI with 90% ISR mid-distal RCA, 99% stenosis ostial PDA and 95% stenosis ostial PLV. Patient had PTCA to the long mid-distal RCA stented segment and DES to ostial PLV and PDA.  LV-gram showed EF in 20% range. Echo 09/19/22 showed EF 20-25%, mod eccentric LVH, severe hypokinesis of the LV entire lateral wall. Normal RV function and size. Mild MR, trivial TR. GDMT titrated and discharged home with LifeVest, weight 110 lbs.  Today she returns for post hospital HF follow up with her friend. Overall feeling fine. She has dyspnea walking further distances or pushing herself physically. She is anxious and not sleeping well, asking for a nerve pill. Denies palpitations, CP, dizziness, edema, or PND/Orthopnea. Appetite ok. No fever or chills. She does not weigh at home. Taking all medications. She smokes 5 cigs/day. No ETOH/drugs use. She is uninsured, does not have a scale or reliable transportation.  ECG (personally reviewed): NSR, LBBB 73 bpm, QRS 150 msec  Zoll: called Zoll, very little wear time since discharge, but no alarms or treatments (from 09/19/22-10/05/22 she worse for 57 hrs total).  Labs (11/23): K 4.1, creatinine 0.66, hgb 11.3  PMH: 1. Hypothyroidism 2. H/o heavy ETOH 3. Active smoker 4. CAD: NSTEMI 2019 with PCI to RCA.  - NSTEMI 2020  with stents to RCA.  - Inferior STEMI (11/23): 90% ISR mid-distal RCA, 99% stenosis ostial PDA and 95% stenosis ostial PLV. Patient had PTCA to the long mid-distal RCA stented segment and DES to ostial PLV and PDA.   5. Chronic systolic CHF: Ischemic cardiomyopathy.  - Echo (5/20) with EF 30-35%.  - LV-gram 11/23 with EF 20% 6. PAD: Long occlusion left SFA  Review of Systems: [y] = yes, [ ]  = no   General: Weight gain [ ] ; Weight loss [ ] ; Anorexia [ ] ; Fatigue [ ] ; Fever [ ] ; Chills [ ] ; Weakness [ ]   Cardiac: Chest pain/pressure [ ] ; Resting SOB [ ] ; Exertional SOB [ ] ; Orthopnea [ ] ; Pedal Edema [ ] ; Palpitations [ ] ; Syncope [ ] ; Presyncope [ ] ; Paroxysmal nocturnal dyspnea[ ]   Pulmonary: Cough [ ] ; Wheezing[ ] ; Hemoptysis[ ] ; Sputum [ ] ; Snoring [ ]   GI: Vomiting[ ] ; Dysphagia[ ] ; Melena[ ] ; Hematochezia [ ] ; Heartburn[ ] ; Abdominal pain [ ] ; Constipation [ ] ; Diarrhea [ ] ; BRBPR [ ]   GU: Hematuria[ ] ; Dysuria [ ] ; Nocturia[ ]   Vascular: Pain in legs with walking ]; Pain in feet with lying flat [ ] ; Non-healing sores [ ] ; Stroke [ ] ; TIA [ ] ; Slurred speech [ ] ;  Neuro: Headaches[ ] ; Vertigo[ ] ; Seizures[ ] ; Paresthesias[ ] ;Blurred vision [ ] ; Diplopia [ ] ; Vision changes [ ]   Ortho/Skin: Arthritis [ ] ; Joint pain [ ] ; Muscle pain [ ] ; Joint swelling [ ] ; Back Pain [ ] ; Rash [ ]   Psych: Depression[ ] ; Anxiety[ ]   Heme: Bleeding problems [ ] ; Clotting disorders [ ] ; Anemia [ ]   Endocrine: Diabetes [ ] ; Thyroid dysfunction[y ]  Past Medical History:  Diagnosis Date   Alcohol use    CAD (coronary artery disease)    Hyperlipidemia, mixed    Hypothyroidism    Ischemic cardiomyopathy    Tobacco abuse    Current Outpatient Medications  Medication Sig Dispense Refill   aspirin 81 MG chewable tablet Chew 1 tablet (81 mg total) by mouth daily.     carvedilol (COREG) 3.125 MG tablet Take 1 tablet (3.125 mg total) by mouth 2 (two) times daily. 60 tablet 0   dapagliflozin propanediol  (FARXIGA) 10 MG TABS tablet Take 1 tablet (10 mg total) by mouth daily. 30 tablet 5   levothyroxine (SYNTHROID) 25 MCG tablet Take 1 tablet (25 mcg total) by mouth daily at 6 (six) AM. 30 tablet 1   losartan (COZAAR) 25 MG tablet Take 1/2 tablet (12.5 mg total) by mouth daily. 30 tablet 5   Multiple Vitamin (MULTIVITAMIN WITH MINERALS) TABS tablet Take 1 tablet by mouth daily. 30 tablet 0   nicotine (NICODERM CQ - DOSED IN MG/24 HOURS) 14 mg/24hr patch Place 1 patch (14 mg total) onto the skin daily. 28 patch 1   rosuvastatin (CRESTOR) 40 MG tablet Take 1 tablet (40 mg total) by mouth daily. 30 tablet 0   spironolactone (ALDACTONE) 25 MG tablet Take 1 tablet (25 mg total) by mouth daily. 30 tablet 5   ticagrelor (BRILINTA) 90 MG TABS tablet Take 1 tablet (90 mg total) by mouth 2 (two) times daily. 60 tablet 5   nitroGLYCERIN (NITROSTAT) 0.4 MG SL tablet Place 1 tablet (0.4 mg total) under the tongue every 5 (five) minutes x 3 doses as needed for chest pain. (Patient not taking: Reported on 10/06/2022) 25 tablet 12   Omega-3 Fatty Acids (FISH OIL) 1200 MG CAPS Take 1,200 mg by mouth daily. (Patient not taking: Reported on 10/06/2022)     No current facility-administered medications for this encounter.   Allergies  Allergen Reactions   Penicillins Other (See Comments)    Fever, vomiting Has patient had a PCN reaction causing immediate rash, facial/tongue/throat swelling, SOB or lightheadedness with hypotension: NO Has patient had a PCN reaction causing severe rash involving mucus membranes or skin necrosis: NO Has patient had a PCN reaction that required hospitalization: NO Has patient had a PCN reaction occurring within the last 10 years: NO If all of the above answers are "NO", then may proceed Childhood allergy   Social History   Socioeconomic History   Marital status: Married    Spouse name: Not on file   Number of children: Not on file   Years of education: Not on file   Highest  education level: Not on file  Occupational History   Not on file  Tobacco Use   Smoking status: Former    Packs/day: 1.00    Years: 22.00    Total pack years: 22.00    Types: Cigarettes    Quit date: 08/09/2018    Years since quitting: 4.1   Smokeless tobacco: Never  Substance and Sexual Activity   Alcohol use: Yes    Alcohol/week: 8.0 standard drinks of alcohol    Types: 8 Glasses of wine per week   Drug use: No   Sexual activity: Not on file  Other Topics Concern   Not on file  Social History Narrative   Married  Gets regular exercise   Social Determinants of Health   Financial Resource Strain: Not on file  Food Insecurity: Not on file  Transportation Needs: Not on file  Physical Activity: Not on file  Stress: Not on file  Social Connections: Not on file  Intimate Partner Violence: Not on file   Family History  Problem Relation Age of Onset   Coronary artery disease Mother    Coronary artery disease Maternal Grandmother    Hypertension Father    Diabetes Father    BP 100/70   Pulse 70   Ht 5\' 2"  (1.575 m)   Wt 51.9 kg (114 lb 6.4 oz)   SpO2 97%   BMI 20.92 kg/m   Wt Readings from Last 3 Encounters:  10/06/22 51.9 kg (114 lb 6.4 oz)  09/20/22 50.2 kg (110 lb 11.2 oz)  03/08/19 60.1 kg (132 lb 6.4 oz)   PHYSICAL EXAM: General:  NAD. No resp difficulty, walked into clinic, thin HEENT: Normal Neck: Supple. No JVD. Carotids 2+ bilat; no bruits. No lymphadenopathy or thryomegaly appreciated. Cor: PMI nondisplaced. Regular rate & rhythm. No rubs, gallops or murmurs. Lungs: Clear Abdomen: Soft, nontender, nondistended. No hepatosplenomegaly. No bruits or masses. Good bowel sounds. Extremities: No cyanosis, clubbing, rash, edema Neuro: Alert & oriented x 3, cranial nerves grossly intact. Moves all 4 extremities w/o difficulty. Affect pleasant.  ASSESSMENT & PLAN: 1. CAD: H/o NSTEMI involving RCA in 2019 and 2020.  Ran out of Plavix, presented with inferior  STEMI.  She had successful stenting of the ostium of the PLV and PDA branches and PTCA of the entire previously stented RCA. - No further chest pain, inferior ST elevation reduced on ECG today. - Continue ASA + ticagrelor. After a year, transition to long-term Plavix. - Continue Crestor 40 mg daily.  2. Chronic systolic CHF: Ischemic cardiomyopathy.  Echo in 5/20 with EF 30-35%. LV-gram at time of cath 11/23 showed EF down to 20%. She was not on HF meds at home. - Echo 09/19/22, EF 20-25%, mod eccentric LVH, severe hypokinesis of the LV entire lateral wall. Normal RV function and size. Mild MR, trivial TR. On exam today, she does not look volume overloaded. NYHA II. GDMT limited by low  BP. - Continue Coreg 3.125 mg bid.  - Continue losartan 12.5 qhs. No BP room for Entresto yet. - Continue spironolactone 25 mg daily. BMET today. - Stop 09/21/22. Start Jardiance 10 mg daily (per HF fund). - Does not appear to need Lasix. - Has LBBB, may qualify for CRT-D device.  Will need echo 3 months to decide on device implantation.  - Continue LifeVest. Discussed importance of consistently wearing. - Unable to do CR as she is uninsured. 3. Hypothyroidism: She is on Levoxyl but TSH still < 0.010.  - Needs follow up with PCP.  4. Smoking: Has nicotine patches, strongly encouraged to quit.  5. PAD: History of long occlusion left SFA.  Has Left calf claudication with long distance walking.  6. SDOH: Lost job, and no uninsured.  Needs to apply for Medicaid.  - Engage HFSW. - Gifted a scale today. - Meds thru HF fund.  - She has to be denied by Medicaid to receive Brilinta assistance. Given samples today; if unable to get patient assistance for Brilinta, will switch to Effient 5 mg (10 mg tablets are $10 at our pharmacy) and can split pills in half, or Plavix. Pharmacy helping.   Follow up in 3 weeks with PharmD (switch to [facility]  if able), 6 weeks with APP and 12 weeks with Dr. Shirlee Latch + echo.  Prince Rome, FNP-BC 10/06/22  Addendum: Discussed with Dr. Shirlee Latch, he prefers to use Effient (vs Plavix) if we can

## 2022-10-06 NOTE — Addendum Note (Signed)
Encounter addended by: Burna Sis, LCSW on: 10/06/2022 4:38 PM  Actions taken: Flowsheet accepted, Clinical Note Signed

## 2022-10-06 NOTE — Addendum Note (Signed)
Encounter addended by: Jacklynn Ganong, FNP on: 10/06/2022 4:21 PM  Actions taken: Problem List reviewed, Medication List reviewed, Allergies reviewed, Clinical Note Signed

## 2022-10-06 NOTE — Addendum Note (Signed)
Encounter addended by: Jacklynn Ganong, FNP on: 10/06/2022 4:48 PM  Actions taken: Clinical Note Signed

## 2022-10-06 NOTE — Progress Notes (Signed)
Heart and Vascular Care Navigation  10/06/2022  Whitney May 11/02/1961 [id number]  Reason for Referral: lack of insurance, food insecurity, medicaid/disability   Engaged with patient face to face for initial visit for Heart and Vascular Care Coordination.                                                                                                   Assessment:  CSW met with pt and pt friend during clinic appt.  Pt reports that she lost insurance after being fired last month because her car was repossessed so she couldn't get to and from work.  Was not screened during inpatient visit for Medicaid so CSW sent message to them to request screening.  Pt reports she thinks a referral was sent for disability- appears as if she was supposed to be referred to servant center- CSW gave her their number to call and confirm she is on their list to assist with disability.  Pt reports some concerns with food insecurity- encouraged her to apply for food stamps at [facility].  Provided pt with Heart and Vascular food bag and list of [facility].  Referred to [facility] for food stamps assistance.                                    HRT/VAS Care Coordination     Patients Home Cardiology Office Heart Failure Clinic   Outpatient Care Team Social Worker   Social Worker Name: Tammy Sours, [facility], [phone]   Living arrangements for the past 2 months Single Family Home   Lives with: Adult Children   Patient Current Insurance Coverage Self-Pay   Patient Has Concern With [place] Yes   Does Patient Have Prescription Coverage? No   Patient Prescription Assistance Programs Heart Failure Fund; Patient [place] Devices/Equipment None   DME Agency NA   [place]       Social History:                                                                             SDOH Screenings   Tobacco Use: Medium Risk (10/06/2022)    SDOH  Interventions: Financial Resources:    Only household income is dtr  Haematologist Insecurity:  Concerns with financial state so unsure about long term food security  Housing Insecurity:  None- owns house  Transportation:   No car in the family    Follow-up plan:    Pt to follow up with pantries/food stamps to assist with food insecurity.  Financial counseling to screen pt for potential medicaid.  Jorge Ny, LCSW Clinical Social Worker Advanced Heart Failure Clinic Desk#: [phone] Cell#: [phone]

## 2022-10-09 NOTE — Addendum Note (Signed)
Encounter addended by: Jacklynn Ganong, FNP on: 10/09/2022 9:43 AM  Actions taken: Clinical Note Signed

## 2022-10-11 NOTE — Telephone Encounter (Signed)
Sent in application via fax.  Will follow up.  

## 2022-10-17 NOTE — Telephone Encounter (Signed)
Advanced Heart Failure Patient Advocate Encounter  Contacted [employer], clarification needed. MD/RX form updated and refaxed on 10/17/22.  Burnell Blanks, CPhT Rx Patient Advocate Phone: [phone]

## 2022-10-18 NOTE — Telephone Encounter (Signed)
Advanced Heart Failure Patient Advocate Encounter   Patient was approved to receive Jardiance from [employer]  Effective dates: 10/18/22 through 10/18/23  Archer Asa, CPhT

## 2022-10-20 ENCOUNTER — Other Ambulatory Visit (HOSPITAL_COMMUNITY): Payer: Self-pay

## 2022-10-26 ENCOUNTER — Other Ambulatory Visit (HOSPITAL_COMMUNITY): Payer: Self-pay

## 2022-10-26 NOTE — Progress Notes (Signed)
Advanced Heart Failure Clinic Note   PCP: Bucio, Julian Reil, FNP HF Cardiologist: Dr. Shirlee Latch   HPI:  [name] is a 61 y.o. female with hx of CAD with NSTEMI in 2019 with stent to RCA and then in 2020 NSTEMI with plaque rupture of RCA in-stent with 2 overlying stents to RCA, hypothyroidism, HLD, PAD, tobacco use, ETOH and ischemic cardiomyopathy EF 30-35% on 03/2019 echo who is being seen 09/18/2022 for the evaluation of STEMI with ischemic cardiomyopathy. She has a history of PAD with long occlusion left SFA.    Admitted 09/2022 with CP, ran out of Plavix for several days. EKG showed LBBB and prominent inferior ST elevation. She had emergent cath for inferior STEMI with 90% ISR mid-distal RCA, 99% stenosis ostial PDA and 95% stenosis ostial PLV. Patient had PTCA to the long mid-distal RCA stented segment and DES to ostial PLV and PDA.  LV-gram showed EF in 20% range. Echo 09/19/22 showed EF 20-25%, mod eccentric LVH, severe hypokinesis of the LV entire lateral wall. Normal RV function and size. Mild MR, trivial TR. GDMT titrated and discharged home with LifeVest, weight 110 lbs.   Returned to [facility] for post hospital HF follow up with her friend 10/06/22. Overall was feeling fine. She had dyspnea walking further distances or pushing herself physically. She was anxious and not sleeping well, asked for a "nerve pill". Denied palpitations, CP, dizziness, edema, or PND/Orthopnea. Appetite was ok. No fever or chills. She reported that she does not weigh at home. Reported taking all medications. She smokes 5 cigs/day. No ETOH/drugs use. She is uninsured, does not have a scale or reliable transportation.  Today she returns to HF clinic for pharmacist medication titration. At last visit with APP, Marcelline Deist was discontinued and Jardiance 10 mg daily was started due to access issues. She has since been approved for [employer] assistance for both [place]. Overall she is feeling ok today. Notes  occasional dizziness when standing. These episodes are mild and last for a few seconds. No CP. Has not needed any SL NTG tablets. No palpitations. No SOB/DOE on flat ground but notes SOB when going up hills or stairs. Has not been weighing herself at home but does have a scale. Weight is up 10 lbs from last visit. No LEE, PND or orthopnea; however, she notes abdominal fullness/bloating and states she thinks she is carrying extra fluid. Does not have reliable transportation to clinic visits. She is uninsured.   HF Medications: Carvedilol 3.125 mg BID Losartan 12.5 mg daily Spironolactone 25 mg daily Jardiance 10 mg daily  Has the patient been experiencing any side effects to the medications prescribed?  no  Does the patient have any problems obtaining medications due to transportation or finances?   Uninsured. She has patient assistance for [place]. Uses HF fund for other HF medications. Set her up to fill through [facility] community pharmacy so medications can be mailed to her as she does not have reliable transportation.   Understanding of regimen: good Understanding of indications: good Potential of compliance: good Patient understands to avoid NSAIDs. Patient understands to avoid decongestants.    Pertinent Lab Values: 10/06/22: Serum creatinine 0.83, BUN 16, Potassium 3.8, Sodium 132  Vital Signs: Weight: 124.2 lbs (last clinic weight: 114.4 lbs) Blood pressure: 110/68  Heart rate: 67   Assessment/Plan: 1. CAD: H/o NSTEMI involving RCA in 2019 and 2020.  Ran out of Plavix, presented with inferior STEMI.  She had successful stenting  of the ostium of the PLV and PDA branches and PTCA of the entire previously stented RCA. - No further chest pain - Continue ASA + ticagrelor. After a year, transition to long-term Plavix. - Continue Crestor 40 mg daily.  2. Chronic systolic CHF: Ischemic cardiomyopathy.  Echo in 03/2019 with EF 30-35%. LV-gram at time of cath 09/2022 showed EF  down to 20%. She was not on HF meds at home. - Echo 09/19/22, EF 20-25%, mod eccentric LVH, severe hypokinesis of the LV entire lateral wall. Normal RV function and size. Mild MR, trivial TR.  - NYHA II. Mildly volume overloaded on exam. Weight up 10 lbs and +abdominal bloating.  - Start Lasix 20 mg PRN. She will take a dose today and tomorrow.  - Continue carvedilol 3.125 mg BID.  - Increase losartan to 25 mg daily. No BP room for Entresto yet. - Continue spironolactone 25 mg daily. - Continue Jardiance 10 mg daily. Receives through [facility] P[facility]. - Has LBBB, may qualify for CRT-D device.  Will need echo 3 months to decide on device implantation.  - Continue LifeVest.  - Unable to do CR as she is uninsured. 3. Hypothyroidism: She is on Levoxyl but TSH still < 0.010.  - Needs follow up with PCP.  4. Smoking: Has nicotine patches, strongly encouraged to quit.  5. PAD: History of long occlusion left SFA.  Has Left calf claudication with long distance walking.  6. SDOH: Lost job, and uninsured.  Needs to apply for Medicaid.  - Engage HFSW. - Gifted a scale at last visit. - Meds thru HF fund.      Follow up 2 weeks with APP Clinic.    Karle Plumber, PharmD, BCPS, BCCP, CPP Heart Failure Clinic Pharmacist [phone]

## 2022-11-01 ENCOUNTER — Other Ambulatory Visit (HOSPITAL_COMMUNITY): Payer: Self-pay | Admitting: *Deleted

## 2022-11-01 ENCOUNTER — Telehealth (HOSPITAL_COMMUNITY): Payer: Self-pay | Admitting: *Deleted

## 2022-11-01 ENCOUNTER — Other Ambulatory Visit (HOSPITAL_COMMUNITY): Payer: Self-pay

## 2022-11-01 MED ORDER — TICAGRELOR 90 MG PO TABS
90.0000 mg | ORAL_TABLET | Freq: Two times a day (BID) | ORAL | 5 refills | Status: DC
  Filled 2022-11-01: qty 60, 30d supply, fill #0

## 2022-11-01 NOTE — Telephone Encounter (Signed)
Pt called stating she is almost out of brilinta pts next appy is 11/07/22. Pt can not afford brilinta and she does not drive. Pt said we provided her samples but its not enough to last her to her appointment.   Routing to social work to see if we can provide transportation for pt to come get samples

## 2022-11-02 ENCOUNTER — Telehealth (HOSPITAL_COMMUNITY): Payer: Self-pay | Admitting: Licensed Clinical Social Worker

## 2022-11-02 ENCOUNTER — Telehealth (HOSPITAL_COMMUNITY): Payer: Self-pay

## 2022-11-02 NOTE — Telephone Encounter (Signed)
CSW consulted to help figure out getting pt brilinta as she has no insurance and cannot afford to pay for medication out of pocket.  Had been provided samples before but ran out- has limited transportation.  CSW called pt to discuss.  She will have access to a car today so can pick up samples this afternoon.  CSW messaged pharmacy advocate to help with obtaining samples and discuss long term patient assistance options with patient.  Burna Sis, LCSW Clinical Social Worker Advanced Heart Failure Clinic Desk#: [phone] Cell#: [phone]

## 2022-11-02 NOTE — Telephone Encounter (Signed)
Advanced Heart Failure Patient Advocate Encounter  Returned patient call regarding affordability of Brilinta. Patient has been successfully enrolled in the [employer] program from 11/02/22 to 11/03/23, and samples were provided until the first shipment can process.  Medication Samples have been left at registration desk for patient pick up. Drug name: Brilinta 43 Qty: 3x 8ct packages LOT: [id number] Exp.: 05/05/25 SIG: Take 1 tablet by mouth twice daily   The patient has been instructed regarding the correct time, dose, and frequency of taking this medication, including desired effects and most common side effects.   Burnell Blanks, CPhT Rx Patient Advocate Phone: [phone]

## 2022-11-07 ENCOUNTER — Other Ambulatory Visit (HOSPITAL_COMMUNITY): Payer: Self-pay

## 2022-11-07 ENCOUNTER — Ambulatory Visit (HOSPITAL_COMMUNITY)
Admission: RE | Admit: 2022-11-07 | Discharge: 2022-11-07 | Disposition: A | Discharge status: Home / Self Care | Payer: Self-pay | Source: Ambulatory Visit | Attending: Cardiology | Admitting: Cardiology

## 2022-11-07 ENCOUNTER — Other Ambulatory Visit: Payer: Self-pay

## 2022-11-07 DIAGNOSIS — E8770 Fluid overload, unspecified: Secondary | ICD-10-CM | POA: Insufficient documentation

## 2022-11-07 DIAGNOSIS — I5022 Chronic systolic (congestive) heart failure: Secondary | ICD-10-CM

## 2022-11-07 DIAGNOSIS — I2510 Atherosclerotic heart disease of native coronary artery without angina pectoris: Secondary | ICD-10-CM

## 2022-11-07 DIAGNOSIS — I252 Old myocardial infarction: Secondary | ICD-10-CM | POA: Insufficient documentation

## 2022-11-07 DIAGNOSIS — I255 Ischemic cardiomyopathy: Secondary | ICD-10-CM | POA: Insufficient documentation

## 2022-11-07 MED ORDER — NITROGLYCERIN 0.4 MG SL SUBL
0.4000 mg | SUBLINGUAL_TABLET | SUBLINGUAL | 12 refills | Status: DC | PRN
  Filled 2022-11-07 – 2023-01-30 (×2): qty 25, 8d supply, fill #0
  Filled 2023-02-13: qty 25, 8d supply, fill #1
  Filled 2023-02-26: qty 25, 8d supply, fill #2
  Filled 2023-03-14: qty 25, 8d supply, fill #3
  Filled 2023-03-22: qty 25, 8d supply, fill #4
  Filled 2023-04-12: qty 25, 8d supply, fill #5

## 2022-11-07 MED ORDER — FUROSEMIDE 20 MG PO TABS
20.0000 mg | ORAL_TABLET | Freq: Every day | ORAL | 5 refills | Status: DC | PRN
  Filled 2022-11-07: qty 30, 30d supply, fill #0

## 2022-11-07 MED ORDER — LOSARTAN POTASSIUM 25 MG PO TABS
25.0000 mg | ORAL_TABLET | Freq: Every day | ORAL | 5 refills | Status: DC
  Filled 2022-11-07: qty 30, 30d supply, fill #0
  Filled 2022-12-11: qty 30, 30d supply, fill #1
  Filled 2022-12-19: qty 30, 30d supply, fill #0

## 2022-11-07 NOTE — Patient Instructions (Addendum)
It was a pleasure seeing you today!  MEDICATIONS: -We are changing your medications today -Increase losartan to 25 mg (1 tablet) every night. - Take Lasix (furosemide) 20 mg (1 tablet) as needed for extra fluid.  - Call [employer] (Brilinta manufacturer) at [phone] to check on Brilinta shipment - Call if you have questions about your medications.  NEXT APPOINTMENT: Return to clinic in 1 week with APP Clinic.  In general, to take care of your heart failure: -Limit your fluid intake to 2 Liters (half-gallon) per day.   -Limit your salt intake to ideally 2-3 grams (2000-3000 mg) per day. -Weigh yourself daily and record, and bring that "weight diary" to your next appointment.  (Weight gain of 2-3 pounds in 1 day typically means fluid weight.) -The medications for your heart are to help your heart and help you live longer.   -Please contact us before stopping any of your heart medications.  Call the clinic at [phone] with questions or to reschedule future appointments.

## 2022-11-08 ENCOUNTER — Other Ambulatory Visit: Payer: Self-pay

## 2022-11-08 ENCOUNTER — Other Ambulatory Visit (HOSPITAL_COMMUNITY): Payer: Self-pay

## 2022-11-08 MED ORDER — TICAGRELOR 90 MG PO TABS: 90.0000 mg | ORAL_TABLET | Freq: Two times a day (BID) | ORAL | 3 refills | Status: DC

## 2022-11-15 NOTE — Progress Notes (Signed)
ADVANCED HF CLINIC  NOTE  PCP: Shelby Dubin, FNP HF Cardiologist: Dr. Shirlee Latch  HPI: Whitney May is a 62 y.o. female with hx of CAD with NSTEMI in 2019 with stent to RCA and then in 2020 NSTEMI with plaque rupture of RCA in-stent with 2 overlying stents to RCA, hypothyroidism, HLD, PAD, tobacco use, ETOH and ischemic cardiomyopathy EF 30-35% on 5/20 echo who is being seen 09/18/2022 for the evaluation of STEMI with ischemic cardiomyopathy. She has a history of PAD with long occlusion left SFA.    Admitted 11/23 with CP, ran out of Plavix for several days. EKG showed LBBB and prominent inferior ST elevation. She had emergent cath for inferior STEMI with 90% ISR mid-distal RCA, 99% stenosis ostial PDA and 95% stenosis ostial PLV. Patient had PTCA to the long mid-distal RCA stented segment and DES to ostial PLV and PDA.  LV-gram showed EF in 20% range. Echo 09/19/22 showed EF 20-25%, mod eccentric LVH, severe hypokinesis of the LV entire lateral wall. Normal RV function and size. Mild MR, trivial TR. GDMT titrated and discharged home with LifeVest, weight 110 lbs.  Last seen in the pharmacy clinic 11/07/22. Lasix 20 mg prn added  and losartan increased to 25 mg daily.   Today she returns for HF follow up. Overall feeling fine. Says has occasional upper abdominal discomfort when she bends over but it goes away when she sits back up. No chest pain. SOB with steps and inclines. Denies  PND/Orthopnea. Appetite ok. No fever or chills. Weight at home  has been stable. Taking all medications but she has been forgetting to take evening dose of coreg.  She has been out of coreg for 2 days. She has only had lasix once a day. She has not been wearing Life Vest at home because its uncomfortable. She does not want to wear Life Vest.  Smokes 10 cigarettes per day. Lives with her daughter and her boyfriend. Not currently working. She has trouble with transportation. She does not have a car.   Labs (11/23): K 4.1,  creatinine 0.66, hgb 11.3 Labs (10/06/22): K 3.8 Creatinine 0.8  PMH: 1. Hypothyroidism 2. H/o heavy ETOH 3. Active smoker 4. CAD: NSTEMI 2019 with PCI to RCA.  - NSTEMI 2020 with stents to RCA.  - Inferior STEMI (11/23): 90% ISR mid-distal RCA, 99% stenosis ostial PDA and 95% stenosis ostial PLV. Patient had PTCA to the long mid-distal RCA stented segment and DES to ostial PLV and PDA.   5. Chronic systolic CHF: Ischemic cardiomyopathy.  - Echo (5/20) with EF 30-35%.  - LV-gram 11/23 with EF 20% 6. PAD: Long occlusion left SFA  Past Medical History:  Diagnosis Date   Alcohol use    CAD (coronary artery disease)    Hyperlipidemia, mixed    Hypothyroidism    Ischemic cardiomyopathy    Tobacco abuse    Current Outpatient Medications  Medication Sig Dispense Refill   aspirin 81 MG chewable tablet Chew 1 tablet (81 mg total) by mouth daily. 30 tablet 3   carvedilol (COREG) 3.125 MG tablet Take 1 tablet (3.125 mg total) by mouth 2 (two) times daily. 60 tablet 3   empagliflozin (JARDIANCE) 10 MG TABS tablet Take 1 tablet (10 mg total) by mouth daily before breakfast. 30 tablet 6   furosemide (LASIX) 20 MG tablet Take 1 tablet (20 mg total) by mouth daily as needed for fluid. 30 tablet 5   levothyroxine (SYNTHROID) 25 MCG tablet Take 1 tablet (  25 mcg total) by mouth daily at 6 (six) AM. 30 tablet 1   losartan (COZAAR) 25 MG tablet Take 1 tablet (25 mg total) by mouth daily. 30 tablet 5   Multiple Vitamin (MULTIVITAMIN WITH MINERALS) TABS tablet Take 1 tablet by mouth daily. 30 tablet 0   nitroGLYCERIN (NITROSTAT) 0.4 MG SL tablet Place 1 tablet (0.4 mg total) under the tongue every 5 (five) minutes x 3 doses as needed for chest pain. 25 tablet 12   Omega-3 Fatty Acids (FISH OIL) 1200 MG CAPS Take 1,200 mg by mouth daily.     rosuvastatin (CRESTOR) 40 MG tablet Take 1 tablet (40 mg total) by mouth daily. 30 tablet 3   spironolactone (ALDACTONE) 25 MG tablet Take 1 tablet (25 mg total) by  mouth daily. 30 tablet 3   ticagrelor (BRILINTA) 90 MG TABS tablet Take 1 tablet (90 mg total) by mouth 2 (two) times daily. 180 tablet 3   No current facility-administered medications for this encounter.   Allergies  Allergen Reactions   Penicillins Other (See Comments)    Fever, vomiting Has patient had a PCN reaction causing immediate rash, facial/tongue/throat swelling, SOB or lightheadedness with hypotension: NO Has patient had a PCN reaction causing severe rash involving mucus membranes or skin necrosis: NO Has patient had a PCN reaction that required hospitalization: NO Has patient had a PCN reaction occurring within the last 10 years: NO If all of the above answers are "NO", then may proceed Childhood allergy   Social History   Socioeconomic History   Marital status: Married    Spouse name: Not on file   Number of children: Not on file   Years of education: Not on file   Highest education level: Not on file  Occupational History   Not on file  Tobacco Use   Smoking status: Former    Packs/day: 1.00    Years: 22.00    Total pack years: 22.00    Types: Cigarettes    Quit date: 08/09/2018    Years since quitting: 4.2   Smokeless tobacco: Never  Substance and Sexual Activity   Alcohol use: Yes    Alcohol/week: 8.0 standard drinks of alcohol    Types: 8 Glasses of wine per week   Drug use: No   Sexual activity: Not on file  Other Topics Concern   Not on file  Social History Narrative   Married   Gets regular exercise   Social Determinants of Health   Financial Resource Strain: Not on file  Food Insecurity: Not on file  Transportation Needs: Not on file  Physical Activity: Not on file  Stress: Not on file  Social Connections: Not on file  Intimate Partner Violence: Not on file   Family History  Problem Relation Age of Onset   Coronary artery disease Mother    Coronary artery disease Maternal Grandmother    Hypertension Father    Diabetes Father    BP  (!) 140/80   Pulse 70   Wt 58.1 kg (128 lb)   SpO2 96%   BMI 23.41 kg/m   Wt Readings from Last 3 Encounters:  11/17/22 58.1 kg (128 lb)  11/07/22 56.3 kg (124 lb 3.2 oz)  10/06/22 51.9 kg (114 lb 6.4 oz)   PHYSICAL EXAM: General:  Walked in the clinic.  No resp difficulty HEENT: normal Neck: supple. no JVD. Carotids 2+ bilat; no bruits. No lymphadenopathy or thryomegaly appreciated. Cor: PMI nondisplaced. Regular rate & rhythm. No rubs,  gallops or murmurs. Lungs: clear Abdomen: soft, nontender, nondistended. No hepatosplenomegaly. No bruits or masses. Good bowel sounds. Extremities: no cyanosis, clubbing, rash, edema Neuro: alert & orientedx3, cranial nerves grossly intact. moves all 4 extremities w/o difficulty. Affect pleasant  ASSESSMENT & PLAN: 1. CAD: H/o NSTEMI involving RCA in 2019 and 2020.  Ran out of Plavix, presented with inferior STEMI.  She had successful stenting of the ostium of the PLV and PDA branches and PTCA of the entire previously stented RCA. - No chest pain  - Continue ASA + ticagrelor. After a year, transition to long-term effient.  - Continue Crestor 40 mg daily. Refill crestor today.  2. Chronic systolic CHF: Ischemic cardiomyopathy.  Echo in 5/20 with EF 30-35%. LV-gram at time of cath 11/23 showed EF down to 20%. She was not on HF meds at home. - Echo 09/19/22, EF 20-25%, mod eccentric LVH, severe hypokinesis of the LV entire lateral wall. Normal RV function and size. Mild MR, trivial TR.  - Restart Coreg 3.125 mg bid. Refill sent to pharmacy  - Continue losartan 12.5 qhs. No BP room for Entresto yet. - Continue spironolactone 25 mg daily. BMET today. -Continue Jardiance 10 mg daily - Has LBBB, may qualify for CRT-D device.   - Continue LifeVest. Discussed importance of Life Vest. She is not wearing it today.  3. Hypothyroidism: She is on Levoxyl but TSH still < 0.010.  - Needs follow up with PCP.   4. Smoking: Discussed smoking cessation.  5. PAD:  History of long occlusion left SFA.  Has Left calf claudication with long distance walking.  6. SDOH:  - Difficulty with transportation. Unemployed and uninsured. Will ask SW to follow up.   Receives brilinta assistance.   Provided with [facility]. We went over diet and medications.    Follow up with Dr. Aundra Dubin + echo in March .  Whitney Grinder NP-C  2:48 PM  11/17/22

## 2022-11-17 ENCOUNTER — Other Ambulatory Visit (HOSPITAL_COMMUNITY): Payer: Self-pay

## 2022-11-17 ENCOUNTER — Other Ambulatory Visit: Payer: Self-pay

## 2022-11-17 ENCOUNTER — Ambulatory Visit (HOSPITAL_COMMUNITY)
Admission: RE | Admit: 2022-11-17 | Discharge: 2022-11-17 | Disposition: A | Discharge status: Home / Self Care | Payer: Self-pay | Source: Ambulatory Visit | Attending: Family Medicine | Admitting: Family Medicine

## 2022-11-17 VITALS — BP 140/80 | HR 70 | Wt 128.0 lb

## 2022-11-17 DIAGNOSIS — Z79899 Other long term (current) drug therapy: Secondary | ICD-10-CM | POA: Insufficient documentation

## 2022-11-17 DIAGNOSIS — I739 Peripheral vascular disease, unspecified: Secondary | ICD-10-CM | POA: Insufficient documentation

## 2022-11-17 DIAGNOSIS — I255 Ischemic cardiomyopathy: Secondary | ICD-10-CM | POA: Insufficient documentation

## 2022-11-17 DIAGNOSIS — I252 Old myocardial infarction: Secondary | ICD-10-CM | POA: Insufficient documentation

## 2022-11-17 DIAGNOSIS — E039 Hypothyroidism, unspecified: Secondary | ICD-10-CM | POA: Insufficient documentation

## 2022-11-17 DIAGNOSIS — I2510 Atherosclerotic heart disease of native coronary artery without angina pectoris: Secondary | ICD-10-CM | POA: Insufficient documentation

## 2022-11-17 DIAGNOSIS — Z8249 Family history of ischemic heart disease and other diseases of the circulatory system: Secondary | ICD-10-CM | POA: Insufficient documentation

## 2022-11-17 DIAGNOSIS — I5022 Chronic systolic (congestive) heart failure: Secondary | ICD-10-CM | POA: Insufficient documentation

## 2022-11-17 DIAGNOSIS — Z87891 Personal history of nicotine dependence: Secondary | ICD-10-CM | POA: Insufficient documentation

## 2022-11-17 DIAGNOSIS — Z955 Presence of coronary angioplasty implant and graft: Secondary | ICD-10-CM | POA: Insufficient documentation

## 2022-11-17 DIAGNOSIS — Z560 Unemployment, unspecified: Secondary | ICD-10-CM | POA: Insufficient documentation

## 2022-11-17 DIAGNOSIS — I447 Left bundle-branch block, unspecified: Secondary | ICD-10-CM | POA: Insufficient documentation

## 2022-11-17 DIAGNOSIS — Z597 Insufficient social insurance and welfare support: Secondary | ICD-10-CM | POA: Insufficient documentation

## 2022-11-17 DIAGNOSIS — Z79890 Hormone replacement therapy (postmenopausal): Secondary | ICD-10-CM | POA: Insufficient documentation

## 2022-11-17 DIAGNOSIS — F17200 Nicotine dependence, unspecified, uncomplicated: Secondary | ICD-10-CM

## 2022-11-17 LAB — BASIC METABOLIC PANEL
Anion gap: 10 (ref 5–15)
BUN: 8 mg/dL (ref 6–20)
CO2: 25 mmol/L (ref 22–32)
Calcium: 9.5 mg/dL (ref 8.9–10.3)
Chloride: 101 mmol/L (ref 98–111)
Creatinine, Ser: 0.83 mg/dL (ref 0.44–1.00)
GFR, Estimated: >60 mL/min (ref >60)
Glucose, Bld: 84 mg/dL (ref 70–99)
Potassium: 3.7 mmol/L (ref 3.5–5.1)
Sodium: 136 mmol/L (ref 135–145)

## 2022-11-17 NOTE — Patient Instructions (Signed)
It was great to see you today! No medication changes are needed at this time.  Labs today We will only contact you if something comes back abnormal or we need to make some changes. Otherwise no news is good news!  Keep cardiology follow up as scheduled   Do the following things EVERYDAY: Weigh yourself in the morning before breakfast. Write it down and keep it in a log. Take your medicines as prescribed Eat low salt foods--Limit salt (sodium) to 2000 mg per day.  Stay as active as you can everyday Limit all fluids for the day to less than 2 liters  At the [facility], you and your health needs are our priority. As part of our continuing mission to provide you with exceptional heart care, we have created designated Provider Care Teams. These Care Teams include your primary Cardiologist (physician) and Advanced Practice Providers (APPs- Physician Assistants and Nurse Practitioners) who all work together to provide you with the care you need, when you need it.   You may see any of the following providers on your designated Care Team at your next follow up: Dr Glori Bickers Dr Loralie Champagne Dr. Roxana Hires, NP Lyda Jester, [place] [facility], [place] Forestine Na, NP Audry Riles, PharmD   Please be sure to bring in all your medications bottles to every appointment.

## 2022-11-20 ENCOUNTER — Telehealth (HOSPITAL_COMMUNITY): Payer: Self-pay | Admitting: Licensed Clinical Social Worker

## 2022-11-20 NOTE — Telephone Encounter (Signed)
H&V Care Navigation CSW Progress Note  Clinical Social Worker reached out to pt to discuss SDOH concerns.  Pt reports that disability and medicaid apps are coming by mail from local DHHS- reports she will receive help applying.  CSW discussed CAFA as way to help with current outstanding [facility] bills- she reports she has been working on it since last time we met in December- is working on Immunologist required documents.  Pt admits to issues with transportation- has a relative who takes her to appts and usually available on Mondays or Tuesdays- does not have any upcoming appts until March so has not immediate concerns.  Referred her to [facility] for in county transport if she needs and informed her if she does not have a ride to March appt that she should call and we would assist.  SDOH Screenings   Tobacco Use: Medium Risk (10/06/2022)   Jorge Ny, [facility] Desk#: [phone] Cell#: [phone]

## 2022-12-11 ENCOUNTER — Other Ambulatory Visit (HOSPITAL_COMMUNITY): Payer: Self-pay

## 2022-12-19 ENCOUNTER — Other Ambulatory Visit (HOSPITAL_COMMUNITY): Payer: Self-pay

## 2022-12-19 ENCOUNTER — Other Ambulatory Visit: Payer: Self-pay

## 2023-01-08 ENCOUNTER — Ambulatory Visit (HOSPITAL_COMMUNITY)
Admission: RE | Admit: 2023-01-08 | Discharge: 2023-01-08 | Disposition: A | Discharge status: Home / Self Care | Payer: Medicaid Other | Source: Ambulatory Visit | Attending: Cardiology | Admitting: Cardiology

## 2023-01-08 ENCOUNTER — Encounter (HOSPITAL_COMMUNITY): Payer: Self-pay | Admitting: Cardiology

## 2023-01-08 ENCOUNTER — Ambulatory Visit (HOSPITAL_BASED_OUTPATIENT_CLINIC_OR_DEPARTMENT_OTHER)
Admission: RE | Admit: 2023-01-08 | Discharge: 2023-01-08 | Disposition: A | Discharge status: Home / Self Care | Payer: Medicaid Other | Source: Ambulatory Visit | Attending: Family Medicine | Admitting: Family Medicine

## 2023-01-08 ENCOUNTER — Other Ambulatory Visit (HOSPITAL_COMMUNITY): Payer: Self-pay

## 2023-01-08 VITALS — BP 92/58 | HR 74 | Ht 62.0 in | Wt 124.6 lb

## 2023-01-08 DIAGNOSIS — I447 Left bundle-branch block, unspecified: Secondary | ICD-10-CM | POA: Diagnosis not present

## 2023-01-08 DIAGNOSIS — Z7984 Long term (current) use of oral hypoglycemic drugs: Secondary | ICD-10-CM | POA: Diagnosis not present

## 2023-01-08 DIAGNOSIS — Z955 Presence of coronary angioplasty implant and graft: Secondary | ICD-10-CM | POA: Diagnosis not present

## 2023-01-08 DIAGNOSIS — E039 Hypothyroidism, unspecified: Secondary | ICD-10-CM | POA: Insufficient documentation

## 2023-01-08 DIAGNOSIS — I252 Old myocardial infarction: Secondary | ICD-10-CM | POA: Diagnosis not present

## 2023-01-08 DIAGNOSIS — I5022 Chronic systolic (congestive) heart failure: Secondary | ICD-10-CM | POA: Diagnosis not present

## 2023-01-08 DIAGNOSIS — I2510 Atherosclerotic heart disease of native coronary artery without angina pectoris: Secondary | ICD-10-CM

## 2023-01-08 DIAGNOSIS — I255 Ischemic cardiomyopathy: Secondary | ICD-10-CM | POA: Diagnosis not present

## 2023-01-08 DIAGNOSIS — Z79899 Other long term (current) drug therapy: Secondary | ICD-10-CM | POA: Insufficient documentation

## 2023-01-08 DIAGNOSIS — E785 Hyperlipidemia, unspecified: Secondary | ICD-10-CM | POA: Insufficient documentation

## 2023-01-08 DIAGNOSIS — F17210 Nicotine dependence, cigarettes, uncomplicated: Secondary | ICD-10-CM | POA: Insufficient documentation

## 2023-01-08 DIAGNOSIS — Z7902 Long term (current) use of antithrombotics/antiplatelets: Secondary | ICD-10-CM | POA: Diagnosis not present

## 2023-01-08 LAB — BRAIN NATRIURETIC PEPTIDE: B Natriuretic Peptide: 166.5 pg/mL — ABNORMAL HIGH (ref 0.0–100.0)

## 2023-01-08 LAB — LIPID PANEL
Cholesterol: 83 mg/dL (ref 0–200)
HDL: 31 mg/dL — ABNORMAL LOW (ref >40)
LDL Cholesterol: 16 mg/dL (ref 0–99)
Total CHOL/HDL Ratio: 2.7 RATIO
Triglycerides: 179 mg/dL — ABNORMAL HIGH (ref <150)
VLDL: 36 mg/dL (ref 0–40)

## 2023-01-08 LAB — BASIC METABOLIC PANEL
Anion gap: 10 (ref 5–15)
BUN: 13 mg/dL (ref 6–20)
CO2: 20 mmol/L — ABNORMAL LOW (ref 22–32)
Calcium: 9.1 mg/dL (ref 8.9–10.3)
Chloride: 104 mmol/L (ref 98–111)
Creatinine, Ser: 0.85 mg/dL (ref 0.44–1.00)
GFR, Estimated: >60 mL/min (ref >60)
Glucose, Bld: 95 mg/dL (ref 70–99)
Potassium: 3 mmol/L — ABNORMAL LOW (ref 3.5–5.1)
Sodium: 134 mmol/L — ABNORMAL LOW (ref 135–145)

## 2023-01-08 LAB — ECHOCARDIOGRAM COMPLETE
Area-P 1/2: 1.58 cm2
Calc EF: 33.1 %
S' Lateral: 4.4 cm
Single Plane A2C EF: 32.7 %
Single Plane A4C EF: 29.7 %

## 2023-01-08 MED ORDER — LOSARTAN POTASSIUM 25 MG PO TABS
25.0000 mg | ORAL_TABLET | Freq: Every day | ORAL | 3 refills | Status: DC
  Filled 2023-01-08: qty 90, 90d supply, fill #0
  Filled 2023-01-30: qty 30, 30d supply, fill #0
  Filled 2023-02-20: qty 30, 30d supply, fill #1
  Filled 2023-03-30 (×2): qty 30, 30d supply, fill #2
  Filled 2023-07-02: qty 30, 30d supply, fill #3

## 2023-01-08 MED ORDER — CARVEDILOL 3.125 MG PO TABS
3.1250 mg | ORAL_TABLET | Freq: Two times a day (BID) | ORAL | 3 refills | Status: DC
  Filled 2023-01-08: qty 60, 30d supply, fill #0
  Filled 2023-01-08: qty 180, 90d supply, fill #0
  Filled 2023-01-30: qty 60, 30d supply, fill #0
  Filled 2023-02-20: qty 60, 30d supply, fill #1
  Filled 2023-03-30 (×2): qty 60, 30d supply, fill #2

## 2023-01-08 MED ORDER — ROSUVASTATIN CALCIUM 40 MG PO TABS
40.0000 mg | ORAL_TABLET | Freq: Every day | ORAL | 3 refills | Status: DC
  Filled 2023-01-08: qty 30, 30d supply, fill #0
  Filled 2023-01-08: qty 90, 90d supply, fill #0
  Filled 2023-02-13: qty 30, 30d supply, fill #0
  Filled 2023-03-14: qty 30, 30d supply, fill #1
  Filled 2023-04-12: qty 30, 30d supply, fill #2
  Filled 2023-05-25: qty 30, 30d supply, fill #3

## 2023-01-08 MED ORDER — SPIRONOLACTONE 25 MG PO TABS
25.0000 mg | ORAL_TABLET | Freq: Every day | ORAL | 3 refills | Status: DC
  Filled 2023-01-08: qty 90, 90d supply, fill #0
  Filled 2023-01-30: qty 30, 30d supply, fill #0
  Filled 2023-02-20: qty 30, 30d supply, fill #1
  Filled 2023-03-30 (×2): qty 30, 30d supply, fill #2

## 2023-01-08 NOTE — Progress Notes (Signed)
Echocardiogram 2D Echocardiogram has been performed.  Whitney May 01/08/2023, 1:51 PM

## 2023-01-08 NOTE — Patient Instructions (Signed)
Change Spironolactone and Losartan to Nightly.  Labs done today, your results will be available in MyChart, we will contact you for abnormal readings.  Your physician recommends that you schedule a follow-up appointment in: 3 months  If you have any questions or concerns before your next appointment please send [place] a message through [employer] or call our office at [phone].    TO LEAVE A MESSAGE FOR THE NURSE SELECT OPTION 2, PLEASE LEAVE A MESSAGE INCLUDING: YOUR NAME DATE OF BIRTH CALL BACK NUMBER REASON FOR CALL**this is important as we prioritize the call backs  YOU WILL RECEIVE A CALL BACK THE SAME DAY AS LONG AS YOU CALL BEFORE 4:00 PM  At the [facility], you and your health needs are our priority. As part of our continuing mission to provide you with exceptional heart care, we have created designated Provider Care Teams. These Care Teams include your primary Cardiologist (physician) and Advanced Practice Providers (APPs- Physician Assistants and Nurse Practitioners) who all work together to provide you with the care you need, when you need it.   You may see any of the following providers on your designated Care Team at your next follow up: Dr Glori Bickers Dr Loralie Champagne Dr. Roxana Hires, NP Lyda Jester, [place] [facility] [place], [place] Forestine Na, NP Audry Riles, PharmD   Please be sure to bring in all your medications bottles to every appointment.    Thank you for choosing [facility]

## 2023-01-08 NOTE — Progress Notes (Signed)
H&V Care Navigation CSW Progress Note  Clinical Social Worker met with pt in clinic to check in regarding Medicaid.  Pt reports she still has $20k in her IRA account so will be over reserve at this time.  Regardless she was contacted by [facility] and says she sent back in paperwork so CSW messaged their team through [facility] to inquire about status.  Pt also thinks she started disability- will plan to look at home for proof of where she started application so CSW can assist her further.  SDOH Screenings   Tobacco Use: Medium Risk (01/08/2023)    Jorge Ny, LCSW Clinical Social Worker [facility] Desk#: [phone] Cell#: [phone]

## 2023-01-08 NOTE — Progress Notes (Signed)
ADVANCED HF CLINIC  NOTE  PCP: Olga Coaster, FNP HF Cardiologist: Dr. Aundra Dubin  HPI: Whitney May is a 62 y.o. female with hx of CAD with NSTEMI in 2019 with stent to RCA and then in 2020 NSTEMI with plaque rupture of RCA in-stent with 2 overlying stents to RCA, hypothyroidism, HLD, PAD, tobacco use, ETOH and ischemic cardiomyopathy EF 30-35% on 5/20 echo who is being seen 09/18/2022 for the evaluation of STEMI with ischemic cardiomyopathy. She has a history of PAD with long occlusion left SFA.    Admitted 11/23 with CP, ran out of Plavix for several days. EKG showed LBBB and prominent inferior ST elevation. She had emergent cath for inferior STEMI with 90% ISR mid-distal RCA, 99% stenosis ostial PDA and 95% stenosis ostial PLV. Patient had PTCA to the long mid-distal RCA stented segment and DES to ostial PLV and PDA.  LV-gram showed EF in 20% range. Echo 09/19/22 showed EF 20-25%, mod eccentric LVH, severe hypokinesis of the LV entire lateral wall. Normal RV function and size. Mild MR, trivial TR. GDMT titrated and discharged home with LifeVest, weight 110 lbs.  Echo was done today and reviewed, EF 25-30% with septal-lateral dyssynchrony, mild RV dysfunction.   Today she returns for HF follow up. She is no longer wearing the Lifevest.  She is still smoking.  No longer working and wants to get disability.  She is short of breath walking up a hill, no dyspnea walking on flat ground.  No palpitations.  She gets lightheaded at times while standing, no syncope or falls.  No orthopnea/PND.  Fatigues easily.   Labs (11/23): K 4.1, creatinine 0.66, hgb 11.3, Lp(a) 9.1, LDL 69 Labs (12/23): K 3.8, Creatinine 0.8 Labs (1/24): K 3.7, creatinine 0.83  ECG (personally reviewed): NSR, LBBB 156 msec  PMH: 1. Hypothyroidism 2. H/o heavy ETOH 3. Active smoker 4. CAD: NSTEMI 2019 with PCI to RCA.  - NSTEMI 2020 with stents to RCA.  - Inferior STEMI (11/23): 90% ISR mid-distal RCA, 99% stenosis ostial PDA and  95% stenosis ostial PLV. Patient had PTCA to the long mid-distal RCA stented segment and DES to ostial PLV and PDA.   5. Chronic systolic CHF: Ischemic cardiomyopathy.  - Echo (5/20) with EF 30-35%.  - LV-gram 11/23 with EF 20% - Echo (3/24): EF 25-30%, septal-lateral dyssynchrony, mildly decreased RV systolic function.  6. PAD: Long occlusion left SFA 7. LBBB   Current Outpatient Medications  Medication Sig Dispense Refill   aspirin 81 MG chewable tablet Chew 1 tablet (81 mg total) by mouth daily. 30 tablet 3   empagliflozin (JARDIANCE) 10 MG TABS tablet Take 1 tablet (10 mg total) by mouth daily before breakfast. 30 tablet 6   furosemide (LASIX) 20 MG tablet Take 1 tablet (20 mg total) by mouth daily as needed for fluid. 30 tablet 5   levothyroxine (SYNTHROID) 25 MCG tablet Take 1 tablet (25 mcg total) by mouth daily at 6 (six) AM. 30 tablet 1   Multiple Vitamin (MULTIVITAMIN WITH MINERALS) TABS tablet Take 1 tablet by mouth daily. 30 tablet 0   nitroGLYCERIN (NITROSTAT) 0.4 MG SL tablet Place 1 tablet (0.4 mg total) under the tongue every 5 (five) minutes x 3 doses as needed for chest pain. 25 tablet 12   Omega-3 Fatty Acids (FISH OIL) 1200 MG CAPS Take 1,200 mg by mouth daily.     ticagrelor (BRILINTA) 90 MG TABS tablet Take 1 tablet (90 mg total) by mouth 2 (two) times  daily. 180 tablet 3   carvedilol (COREG) 3.125 MG tablet Take 1 tablet (3.125 mg total) by mouth 2 (two) times daily. 180 tablet 3   losartan (COZAAR) 25 MG tablet Take 1 tablet (25 mg total) by mouth daily. 90 tablet 3   rosuvastatin (CRESTOR) 40 MG tablet Take 1 tablet (40 mg total) by mouth daily. 90 tablet 3   spironolactone (ALDACTONE) 25 MG tablet Take 1 tablet (25 mg total) by mouth daily. 90 tablet 3   No current facility-administered medications for this encounter.   Allergies  Allergen Reactions   Penicillins Other (See Comments)    Fever, vomiting Has patient had a PCN reaction causing immediate rash,  facial/tongue/throat swelling, SOB or lightheadedness with hypotension: NO Has patient had a PCN reaction causing severe rash involving mucus membranes or skin necrosis: NO Has patient had a PCN reaction that required hospitalization: NO Has patient had a PCN reaction occurring within the last 10 years: NO If all of the above answers are "NO", then may proceed Childhood allergy   Social History   Socioeconomic History   Marital status: Married    Spouse name: Not on file   Number of children: Not on file   Years of education: Not on file   Highest education level: Not on file  Occupational History   Not on file  Tobacco Use   Smoking status: Former    Packs/day: 1.00    Years: 22.00    Total pack years: 22.00    Types: Cigarettes    Quit date: 08/09/2018    Years since quitting: 4.4   Smokeless tobacco: Never  Substance and Sexual Activity   Alcohol use: Yes    Alcohol/week: 8.0 standard drinks of alcohol    Types: 8 Glasses of wine per week   Drug use: No   Sexual activity: Not on file  Other Topics Concern   Not on file  Social History Narrative   Married   Gets regular exercise   Social Determinants of Health   Financial Resource Strain: Not on file  Food Insecurity: Not on file  Transportation Needs: Not on file  Physical Activity: Not on file  Stress: Not on file  Social Connections: Not on file  Intimate Partner Violence: Not on file   Family History  Problem Relation Age of Onset   Coronary artery disease Mother    Coronary artery disease Maternal Grandmother    Hypertension Father    Diabetes Father    BP (!) 92/58   Pulse 74   Ht '5\' 2"'$  (1.575 m)   Wt 56.5 kg (124 lb 9.6 oz)   SpO2 98%   BMI 22.79 kg/m   Wt Readings from Last 3 Encounters:  01/08/23 56.5 kg (124 lb 9.6 oz)  11/17/22 58.1 kg (128 lb)  11/07/22 56.3 kg (124 lb 3.2 oz)   PHYSICAL EXAM: General: NAD Neck: No JVD, no thyromegaly or thyroid nodule.  Lungs: Clear to auscultation  bilaterally with normal respiratory effort. CV: Nondisplaced PMI.  Heart regular S1/S2, no S3/S4, no murmur.  No peripheral edema.  No carotid bruit.  Difficult to palpate pedal pulses.  Abdomen: Soft, nontender, no hepatosplenomegaly, no distention.  Skin: Intact without lesions or rashes.  Neurologic: Alert and oriented x 3.  Psych: Normal affect. Extremities: No clubbing or cyanosis.  HEENT: Normal.   ASSESSMENT & PLAN: 1. CAD: H/o NSTEMI involving RCA in 2019 and 2020.  Ran out of Plavix, presented with inferior STEMI in  11/23.  She had successful stenting of the ostium of the PLV and PDA branches and PTCA of the entire previously stented RCA.  No chest pain.  - Continue ASA + ticagrelor. After a year, transition to long-term Plavix.   - Continue Crestor 40 mg daily. Check lipids today.  2. Chronic systolic CHF: Ischemic cardiomyopathy.  Echo in 5/20 with EF 30-35%. LV-gram at time of cath 11/23 showed EF down to 20%. She was not on HF meds at home. Echo 09/19/22 showed EF 20-25%, mod eccentric LVH, severe hypokinesis of the LV entire lateral wall. Normal RV function and size. Mild MR, trivial TR. Echo today showed EF 25-30% with septal-lateral dyssynchrony, mild RV dysfunction.  NYHA class II, not volume overloaded on exam.  GDMT has been limited by soft BP.  - Continue Coreg 3.125 mg bid.  - Continue losartan 25 mg qhs. No BP room for Entresto yet. - Continue spironolactone 25 daily, move to qhs. BMET/BNP today.  - Continue Jardiance 10 mg daily - She does not appear to need regular Lasix.  - Has LBBB and persistently low EF from ischemic cardiomyopathy.  She qualifies for CRT-D placement but needs insurance coverage.  Social work is trying to help her get on Medicaid, then will need CRT-D device.  - She refuses to wear Lifevest any longer.  3. Hypothyroidism: Per PCP.   4. Smoking: Discussed smoking cessation.  - She has nicotine patches at home and will use.  5. PAD: History of long  occlusion left SFA.  Has Left calf claudication with long distance walking.  No rest pain or pedal ulcerations.   Followup 3 months with APP.  Needs social work assistance for [place] so she can get coverage for CRT-D device placement.   Loralie Champagne  01/08/2023

## 2023-01-09 ENCOUNTER — Telehealth (HOSPITAL_COMMUNITY): Payer: Self-pay

## 2023-01-09 DIAGNOSIS — I5022 Chronic systolic (congestive) heart failure: Secondary | ICD-10-CM

## 2023-01-09 MED ORDER — POTASSIUM CHLORIDE CRYS ER 20 MEQ PO TBCR: 20.0000 meq | EXTENDED_RELEASE_TABLET | Freq: Every day | ORAL | 11 refills | Status: DC

## 2023-01-09 NOTE — Telephone Encounter (Signed)
Patient's med has been sent to pharmacy and has been up dated. In addition, pt's lab has been placed and her appointment has been scheduled.  Pt aware, agreeable, and verbalized understanding.

## 2023-01-23 ENCOUNTER — Other Ambulatory Visit (HOSPITAL_COMMUNITY): Payer: Self-pay

## 2023-01-24 ENCOUNTER — Other Ambulatory Visit (HOSPITAL_COMMUNITY): Payer: Self-pay

## 2023-01-30 ENCOUNTER — Other Ambulatory Visit: Payer: Self-pay

## 2023-01-30 ENCOUNTER — Other Ambulatory Visit (HOSPITAL_COMMUNITY): Payer: Self-pay

## 2023-02-13 ENCOUNTER — Other Ambulatory Visit (HOSPITAL_COMMUNITY): Payer: Self-pay

## 2023-02-13 ENCOUNTER — Other Ambulatory Visit: Payer: Self-pay

## 2023-02-20 ENCOUNTER — Other Ambulatory Visit (HOSPITAL_COMMUNITY): Payer: Self-pay

## 2023-02-20 ENCOUNTER — Other Ambulatory Visit: Payer: Self-pay

## 2023-02-26 ENCOUNTER — Other Ambulatory Visit (HOSPITAL_COMMUNITY): Payer: Self-pay

## 2023-03-14 ENCOUNTER — Other Ambulatory Visit (HOSPITAL_COMMUNITY): Payer: Self-pay

## 2023-03-22 ENCOUNTER — Other Ambulatory Visit (HOSPITAL_COMMUNITY): Payer: Self-pay

## 2023-03-30 ENCOUNTER — Other Ambulatory Visit (HOSPITAL_COMMUNITY): Payer: Self-pay

## 2023-04-03 ENCOUNTER — Other Ambulatory Visit (HOSPITAL_COMMUNITY): Payer: Self-pay

## 2023-04-09 ENCOUNTER — Telehealth (HOSPITAL_COMMUNITY): Payer: Self-pay

## 2023-04-09 NOTE — Telephone Encounter (Signed)
Called to confirm/remind patient of their appointment at the [facility] on 04/10/23. However, patient needed to cancel the appointment and will give our office a call to reschedule.

## 2023-04-10 ENCOUNTER — Encounter (HOSPITAL_COMMUNITY): Payer: Self-pay

## 2023-04-12 ENCOUNTER — Inpatient Hospital Stay (HOSPITAL_COMMUNITY)
Admission: EM | Admit: 2023-04-12 | Discharge: 2023-04-17 | DRG: 280 | Disposition: A | Discharge status: Home / Self Care | Payer: Medicaid Other | Attending: Internal Medicine | Admitting: Internal Medicine

## 2023-04-12 ENCOUNTER — Other Ambulatory Visit: Payer: Self-pay

## 2023-04-12 ENCOUNTER — Emergency Department (HOSPITAL_COMMUNITY): Payer: Medicaid Other

## 2023-04-12 ENCOUNTER — Other Ambulatory Visit (HOSPITAL_COMMUNITY): Payer: Self-pay

## 2023-04-12 ENCOUNTER — Encounter (HOSPITAL_COMMUNITY): Payer: Self-pay | Admitting: Emergency Medicine

## 2023-04-12 DIAGNOSIS — Z8249 Family history of ischemic heart disease and other diseases of the circulatory system: Secondary | ICD-10-CM | POA: Diagnosis not present

## 2023-04-12 DIAGNOSIS — I25110 Atherosclerotic heart disease of native coronary artery with unstable angina pectoris: Secondary | ICD-10-CM | POA: Diagnosis not present

## 2023-04-12 DIAGNOSIS — J440 Chronic obstructive pulmonary disease with acute lower respiratory infection: Secondary | ICD-10-CM | POA: Diagnosis present

## 2023-04-12 DIAGNOSIS — I9589 Other hypotension: Secondary | ICD-10-CM | POA: Diagnosis present

## 2023-04-12 DIAGNOSIS — Z880 Allergy status to penicillin: Secondary | ICD-10-CM | POA: Diagnosis not present

## 2023-04-12 DIAGNOSIS — I21A9 Other myocardial infarction type: Secondary | ICD-10-CM | POA: Diagnosis present

## 2023-04-12 DIAGNOSIS — Z7902 Long term (current) use of antithrombotics/antiplatelets: Secondary | ICD-10-CM

## 2023-04-12 DIAGNOSIS — J441 Chronic obstructive pulmonary disease with (acute) exacerbation: Secondary | ICD-10-CM | POA: Diagnosis present

## 2023-04-12 DIAGNOSIS — R0902 Hypoxemia: Secondary | ICD-10-CM | POA: Diagnosis present

## 2023-04-12 DIAGNOSIS — E876 Hypokalemia: Secondary | ICD-10-CM | POA: Diagnosis present

## 2023-04-12 DIAGNOSIS — I3139 Other pericardial effusion (noninflammatory): Secondary | ICD-10-CM | POA: Diagnosis present

## 2023-04-12 DIAGNOSIS — I952 Hypotension due to drugs: Secondary | ICD-10-CM | POA: Diagnosis present

## 2023-04-12 DIAGNOSIS — Z79890 Hormone replacement therapy (postmenopausal): Secondary | ICD-10-CM

## 2023-04-12 DIAGNOSIS — Y92009 Unspecified place in unspecified non-institutional (private) residence as the place of occurrence of the external cause: Secondary | ICD-10-CM

## 2023-04-12 DIAGNOSIS — Y831 Surgical operation with implant of artificial internal device as the cause of abnormal reaction of the patient, or of later complication, without mention of misadventure at the time of the procedure: Secondary | ICD-10-CM | POA: Diagnosis present

## 2023-04-12 DIAGNOSIS — R0789 Other chest pain: Secondary | ICD-10-CM | POA: Diagnosis not present

## 2023-04-12 DIAGNOSIS — R079 Chest pain, unspecified: Principal | ICD-10-CM

## 2023-04-12 DIAGNOSIS — I2510 Atherosclerotic heart disease of native coronary artery without angina pectoris: Secondary | ICD-10-CM | POA: Diagnosis present

## 2023-04-12 DIAGNOSIS — E782 Mixed hyperlipidemia: Secondary | ICD-10-CM | POA: Diagnosis present

## 2023-04-12 DIAGNOSIS — F419 Anxiety disorder, unspecified: Secondary | ICD-10-CM | POA: Diagnosis present

## 2023-04-12 DIAGNOSIS — F17210 Nicotine dependence, cigarettes, uncomplicated: Secondary | ICD-10-CM | POA: Diagnosis present

## 2023-04-12 DIAGNOSIS — Z7982 Long term (current) use of aspirin: Secondary | ICD-10-CM | POA: Diagnosis not present

## 2023-04-12 DIAGNOSIS — E872 Acidosis, unspecified: Secondary | ICD-10-CM | POA: Diagnosis present

## 2023-04-12 DIAGNOSIS — I5022 Chronic systolic (congestive) heart failure: Secondary | ICD-10-CM | POA: Diagnosis not present

## 2023-04-12 DIAGNOSIS — I5042 Chronic combined systolic (congestive) and diastolic (congestive) heart failure: Secondary | ICD-10-CM | POA: Diagnosis present

## 2023-04-12 DIAGNOSIS — I255 Ischemic cardiomyopathy: Secondary | ICD-10-CM | POA: Diagnosis present

## 2023-04-12 DIAGNOSIS — T82855A Stenosis of coronary artery stent, initial encounter: Secondary | ICD-10-CM | POA: Diagnosis not present

## 2023-04-12 DIAGNOSIS — Z79899 Other long term (current) drug therapy: Secondary | ICD-10-CM

## 2023-04-12 DIAGNOSIS — Z955 Presence of coronary angioplasty implant and graft: Secondary | ICD-10-CM

## 2023-04-12 DIAGNOSIS — I25119 Atherosclerotic heart disease of native coronary artery with unspecified angina pectoris: Secondary | ICD-10-CM | POA: Diagnosis present

## 2023-04-12 DIAGNOSIS — E7800 Pure hypercholesterolemia, unspecified: Secondary | ICD-10-CM | POA: Diagnosis not present

## 2023-04-12 DIAGNOSIS — I447 Left bundle-branch block, unspecified: Secondary | ICD-10-CM | POA: Diagnosis present

## 2023-04-12 DIAGNOSIS — Z7984 Long term (current) use of oral hypoglycemic drugs: Secondary | ICD-10-CM

## 2023-04-12 DIAGNOSIS — R609 Edema, unspecified: Secondary | ICD-10-CM | POA: Diagnosis not present

## 2023-04-12 DIAGNOSIS — J449 Chronic obstructive pulmonary disease, unspecified: Secondary | ICD-10-CM | POA: Diagnosis not present

## 2023-04-12 DIAGNOSIS — Z720 Tobacco use: Secondary | ICD-10-CM

## 2023-04-12 DIAGNOSIS — J189 Pneumonia, unspecified organism: Secondary | ICD-10-CM | POA: Diagnosis present

## 2023-04-12 DIAGNOSIS — I959 Hypotension, unspecified: Secondary | ICD-10-CM | POA: Diagnosis not present

## 2023-04-12 DIAGNOSIS — E039 Hypothyroidism, unspecified: Secondary | ICD-10-CM | POA: Diagnosis present

## 2023-04-12 DIAGNOSIS — I252 Old myocardial infarction: Secondary | ICD-10-CM

## 2023-04-12 DIAGNOSIS — Z833 Family history of diabetes mellitus: Secondary | ICD-10-CM

## 2023-04-12 DIAGNOSIS — T463X1A Poisoning by coronary vasodilators, accidental (unintentional), initial encounter: Secondary | ICD-10-CM | POA: Diagnosis present

## 2023-04-12 DIAGNOSIS — E875 Hyperkalemia: Secondary | ICD-10-CM | POA: Diagnosis present

## 2023-04-12 DIAGNOSIS — I214 Non-ST elevation (NSTEMI) myocardial infarction: Secondary | ICD-10-CM | POA: Diagnosis present

## 2023-04-12 DIAGNOSIS — E785 Hyperlipidemia, unspecified: Secondary | ICD-10-CM | POA: Diagnosis not present

## 2023-04-12 LAB — COMPREHENSIVE METABOLIC PANEL
ALT: 28 U/L (ref 0–44)
AST: 24 U/L (ref 15–41)
Albumin: 3 g/dL — ABNORMAL LOW (ref 3.5–5.0)
Alkaline Phosphatase: 71 U/L (ref 38–126)
Anion gap: 7 (ref 5–15)
BUN: 11 mg/dL (ref 6–20)
CO2: 25 mmol/L (ref 22–32)
Calcium: 8.8 mg/dL — ABNORMAL LOW (ref 8.9–10.3)
Chloride: 105 mmol/L (ref 98–111)
Creatinine, Ser: 0.88 mg/dL (ref 0.44–1.00)
GFR, Estimated: >60 mL/min (ref >60)
Glucose, Bld: 109 mg/dL — ABNORMAL HIGH (ref 70–99)
Potassium: 3 mmol/L — ABNORMAL LOW (ref 3.5–5.1)
Sodium: 137 mmol/L (ref 135–145)
Total Bilirubin: 0.5 mg/dL (ref 0.3–1.2)
Total Protein: 6.4 g/dL — ABNORMAL LOW (ref 6.5–8.1)

## 2023-04-12 LAB — TROPONIN I (HIGH SENSITIVITY)
Troponin I (High Sensitivity): 10 ng/L (ref <18)
Troponin I (High Sensitivity): 14 ng/L (ref <18)

## 2023-04-12 LAB — CBC
HCT: 40.2 % (ref 36.0–46.0)
Hemoglobin: 13.2 g/dL (ref 12.0–15.0)
MCH: 28.6 pg (ref 26.0–34.0)
MCHC: 32.8 g/dL (ref 30.0–36.0)
MCV: 87 fL (ref 80.0–100.0)
Platelets: 316 10*3/uL (ref 150–400)
RBC: 4.62 MIL/uL (ref 3.87–5.11)
RDW: 15 % (ref 11.5–15.5)
WBC: 12.9 10*3/uL — ABNORMAL HIGH (ref 4.0–10.5)
nRBC: 0 % (ref 0.0–0.2)

## 2023-04-12 LAB — MAGNESIUM: Magnesium: 1.9 mg/dL (ref 1.7–2.4)

## 2023-04-12 MED ORDER — LORAZEPAM 2 MG/ML IJ SOLN
0.5000 mg | Freq: Once | INTRAMUSCULAR | Status: AC
  Administered 2023-04-12: 0.5 mg via INTRAVENOUS
  Filled 2023-04-12: qty 1

## 2023-04-12 MED ORDER — ASPIRIN 81 MG PO TBEC
270.0000 mg | DELAYED_RELEASE_TABLET | Freq: Once | ORAL | Status: DC
  Filled 2023-04-12: qty 3

## 2023-04-12 MED ORDER — NITROGLYCERIN 0.4 MG SL SUBL
SUBLINGUAL_TABLET | SUBLINGUAL | Status: AC
  Filled 2023-04-12: qty 1

## 2023-04-12 MED ORDER — IOHEXOL 350 MG/ML SOLN
100.0000 mL | Freq: Once | INTRAVENOUS | Status: AC | PRN
  Administered 2023-04-12: 100 mL via INTRAVENOUS

## 2023-04-12 MED ORDER — NOREPINEPHRINE 4 MG/250ML-% IV SOLN
0.0000 ug/min | INTRAVENOUS | Status: DC
  Administered 2023-04-12: 2 ug/min via INTRAVENOUS
  Administered 2023-04-13: 4 ug/min via INTRAVENOUS
  Filled 2023-04-12 (×2): qty 250

## 2023-04-12 MED ORDER — FENTANYL CITRATE PF 50 MCG/ML IJ SOSY
50.0000 ug | PREFILLED_SYRINGE | Freq: Once | INTRAMUSCULAR | Status: AC
  Administered 2023-04-12: 50 ug via INTRAVENOUS
  Filled 2023-04-12: qty 1

## 2023-04-12 MED ORDER — FENTANYL CITRATE PF 50 MCG/ML IJ SOSY
PREFILLED_SYRINGE | INTRAMUSCULAR | Status: AC
  Administered 2023-04-12: 50 ug
  Filled 2023-04-12: qty 1

## 2023-04-12 NOTE — ED Notes (Signed)
Patient transported to X-ray 

## 2023-04-12 NOTE — ED Notes (Signed)
Pt placed on 2L [place] d/t O2 saturation dropping to 89% RA.

## 2023-04-12 NOTE — ED Notes (Signed)
Pt called RN back to room c/o CP rated 9/10.  Pt very anxious.  EDP at beside.

## 2023-04-12 NOTE — ED Triage Notes (Signed)
Pt c/o chest pain x 30 minutes. Ems gave nitro and pt took asa at home. Pt states chest pain is better since nitro.

## 2023-04-12 NOTE — ED Notes (Signed)
Patient transported to CT 

## 2023-04-12 NOTE — ED Provider Notes (Signed)
[place] EMERGENCY DEPARTMENT AT [facility] Provider Note   CSN: [id number] Arrival date & time: 04/12/23  1937     History  Chief Complaint  Patient presents with   Chest Pain    Whitney May is a 62 y.o. female with CAD w/ h/o NSTEMI 2019/2020 and STEMI 2023, acute systolic HF (EF 25-30%), hypothyroidism, HLD, h/o pericardial effusion, h/o PAD w/ long occlusion L SFA, h/o heavy EtOH use who presents with CP.   Patient states acute onset squeezing chest pain radiating into her left arm associated with diaphoresis and shortness of breath that began 30 minutes prior to arrival while she was laying in bed watching TV.  She had this chest pain last night and this morning as well but it is worsening.  She took 1 sublingual nitroglycerin last night for chest pain similar to this that woke her from sleep, which relieved her pain.  She also took 2 nitros this morning and then subsequently ran out.  She called EMS who gave her an additional sublingual nitro, which again relieved her pain.  She states now she is just sore in her chest.  She states the pain is also radiating into her back.  She states this is similar pain that she had when she had her prior MI last year. Pt last saw cardiology 01/08/23, still +tobacco use.  Additionally she was hypotensive with EMS into the 80s systolic and received 500 cc fluid bolus.  On arrival to the ED she is hypotensive into the 70s systolic.   Chest Pain      Home Medications Prior to Admission medications   Medication Sig Start Date End Date Taking? Authorizing Provider  aspirin 81 MG chewable tablet Chew 1 tablet (81 mg total) by mouth daily. 10/06/22  Yes Milford, Anderson Malta, FNP  benzonatate (TESSALON) 100 MG capsule Take 100 mg by mouth every 8 (eight) hours.   Yes [provider]  carvedilol (COREG) 3.125 MG tablet Take 1 tablet (3.125 mg total) by mouth 2 (two) times daily. 01/08/23  Yes Laurey Morale, MD  empagliflozin  (JARDIANCE) 10 MG TABS tablet Take 1 tablet (10 mg total) by mouth daily before breakfast. 10/06/22  Yes Milford, Anderson Malta, FNP  furosemide (LASIX) 20 MG tablet Take 1 tablet (20 mg total) by mouth daily as needed for fluid. 11/07/22  Yes Laurey Morale, MD  levothyroxine (SYNTHROID) 137 MCG tablet Take 137 mcg by mouth every morning.   Yes [provider]  losartan (COZAAR) 25 MG tablet Take 1 tablet (25 mg total) by mouth daily. 01/08/23  Yes Laurey Morale, MD  Multiple Vitamin (MULTIVITAMIN WITH MINERALS) TABS tablet Take 1 tablet by mouth daily. 09/25/15  Yes Hongalgi, Maximino Greenland, MD  nitrofurantoin, macrocrystal-monohydrate, (MACROBID) 100 MG capsule Take 100 mg by mouth 2 (two) times daily. 7 days supply 04/08/23  Yes [provider]  nitroGLYCERIN (NITROSTAT) 0.4 MG SL tablet Place 1 tablet (0.4 mg total) under the tongue every 5 (five) minutes x 3 doses as needed for chest pain. 11/07/22  Yes Laurey Morale, MD  Omega-3 Fatty Acids (FISH OIL) 1200 MG CAPS Take 1,200 mg by mouth daily.   Yes [provider]  potassium chloride SA (KLOR-CON M) 20 MEQ tablet Take 1 tablet (20 mEq total) by mouth daily. Patient taking differently: Take 20 mEq by mouth 2 (two) times daily. For 5 days 01/09/23  Yes Laurey Morale, MD  rosuvastatin (CRESTOR) 40 MG tablet  Take 1 tablet (40 mg total) by mouth daily. 01/08/23  Yes Laurey Morale, MD  spironolactone (ALDACTONE) 25 MG tablet Take 1 tablet (25 mg total) by mouth daily. 01/08/23  Yes Laurey Morale, MD  ticagrelor (BRILINTA) 90 MG TABS tablet Take 1 tablet (90 mg total) by mouth 2 (two) times daily. 11/08/22  Yes Milford, Anderson Malta, FNP  azithromycin (ZITHROMAX) 250 MG tablet TAKE 2 TABLETS BY MOUTH ON DAY 1, THEN TAKE 1 TABLET DAILY ON DAYS 2-5 Patient not taking: Reported on 04/12/2023 03/31/23   [provider]      Allergies    Penicillins    Review of Systems   Review of Systems  Cardiovascular:  Positive for chest  pain.   Review of systems Negative for f/c, cough, recent illnesses.  A 10 point review of systems was performed and is negative unless otherwise reported in HPI.  Physical Exam Updated Vital Signs BP (!) 100/48   Pulse 64   Temp 97.7 F (36.5 C) (Oral)   Resp 18   Ht 5\' 2"  (1.575 m)   Wt 56 kg   SpO2 99%   BMI 22.58 kg/m  Physical Exam General: Extremely uncomfortable appearing female, lying in bed. Diaphoretic. HEENT: Sclera anicteric, MMM, trachea midline.  Cardiology: RRR, no murmurs/rubs/gallops. BL radial and DP pulses equal bilaterally.  Resp: Normal respiratory rate and effort. CTAB, no wheezes, rhonchi, crackles.  Abd: Soft, non-tender, non-distended. No rebound tenderness or guarding.  GU: Deferred. MSK: No peripheral edema or signs of trauma. Extremities without deformity or TTP. No cyanosis or clubbing. Skin: warm, dry.  Neuro: A&Ox4, CNs II-XII grossly intact. MAEs. Sensation grossly intact.  Psych: Normal mood and affect.   ED Results / Procedures / Treatments   Labs (all labs ordered are listed, but only abnormal results are displayed) Labs Reviewed  CBC - Abnormal; Notable for the following components:      Result Value   WBC 12.9 (*)    All other components within normal limits  COMPREHENSIVE METABOLIC PANEL - Abnormal; Notable for the following components:   Potassium 3.0 (*)    Glucose, Bld 109 (*)    Calcium 8.8 (*)    Total Protein 6.4 (*)    Albumin 3.0 (*)    All other components within normal limits  MAGNESIUM  TROPONIN I (HIGH SENSITIVITY)  TROPONIN I (HIGH SENSITIVITY)    EKG EKG Interpretation  Date/Time:  Thursday April 12 2023 19:46:46 EDT Ventricular Rate:  87 PR Interval:  224 QRS Duration: 156 QT Interval:  442 QTC Calculation: 532 R Axis:   -37 Text Interpretation: Sinus rhythm Prolonged PR interval Left bundle branch block Confirmed by Vivi Barrack [phone]) on 04/12/2023 8:33:21 PM  Radiology CT Angio Chest/Abd/Pel for  Dissection W and/or Wo Contrast  Result Date: 04/12/2023 CLINICAL DATA:  Chest pain for 1 hour EXAM: CT ANGIOGRAPHY CHEST, ABDOMEN AND PELVIS TECHNIQUE: Non-contrast CT of the chest was initially obtained. Multidetector CT imaging through the chest, abdomen and pelvis was performed using the standard protocol during bolus administration of intravenous contrast. Multiplanar reconstructed images and MIPs were obtained and reviewed to evaluate the vascular anatomy. RADIATION DOSE REDUCTION: This exam was performed according to the departmental dose-optimization program which includes automated exposure control, adjustment of the mA and/or kV according to patient size and/or use of iterative reconstruction technique. CONTRAST:  OMNIPAQUE IOHEXOL 350 MG/ML SOLN COMPARISON:  Plain film from earlier in the same day. FINDINGS: CTA CHEST FINDINGS Cardiovascular:  Initial precontrast images show no hyperdense crescent to suggest acute aortic injury. The thoracic aorta demonstrates atherosclerotic calcifications. No aneurysmal dilatation or dissection is noted. No cardiac enlargement is seen. The pulmonary artery is within normal limits although not timed for embolus evaluation. No significant coronary calcifications are noted. Mediastinum/Nodes: Thoracic inlet is within normal limits. No hilar or mediastinal adenopathy is noted. The esophagus is within normal limits. Lungs/Pleura: Mild emphysematous changes are noted. The lungs are otherwise well aerated bilaterally. No focal infiltrate or effusion is seen. No parenchymal nodules are noted. Musculoskeletal: No chest wall abnormality. No acute or significant osseous findings. Review of the MIP images confirms the above findings. CTA ABDOMEN AND PELVIS FINDINGS VASCULAR Aorta: Atherosclerotic calcifications are noted without aneurysmal dilatation or dissection. Celiac: Patent without evidence of aneurysm, dissection, vasculitis or significant stenosis. SMA: Patent without  evidence of aneurysm, dissection, vasculitis or significant stenosis. Renals: Both renal arteries are patent without evidence of aneurysm, dissection, vasculitis, fibromuscular dysplasia or significant stenosis. IMA: Patent without evidence of aneurysm, dissection, vasculitis or significant stenosis. Inflow: Iliacs demonstrate atherosclerotic calcifications without aneurysmal dilatation or dissection. Veins: No specific venous abnormality is noted. Review of the MIP images confirms the above findings. NON-VASCULAR Hepatobiliary: No focal liver abnormality is seen. No gallstones, gallbladder wall thickening, or biliary dilatation. Pancreas: Unremarkable. No pancreatic ductal dilatation or surrounding inflammatory changes. Spleen: Normal in size without focal abnormality. Adrenals/Urinary Tract: Adrenal glands are within normal limits. Kidneys demonstrate a normal enhancement pattern bilaterally. No renal calculi or obstructive changes are seen. The bladder is within normal limits. Stomach/Bowel: The appendix is not discretely visualized. No inflammatory changes to suggest appendicitis are noted. No obstructive or inflammatory changes of the colon are seen. Stomach and small bowel are within normal limits. Lymphatic: No significant lymphadenopathy is noted. Reproductive: Uterus and bilateral adnexa are unremarkable. Other: No abdominal wall hernia or abnormality. No abdominopelvic ascites. Musculoskeletal: No acute or significant osseous findings. Review of the MIP images confirms the above findings. IMPRESSION: CTA of the chest: No evidence of aortic abnormality. No pulmonary emboli are seen. CTA of the abdomen and pelvis: No aortic abnormality is noted. No acute abnormality is seen. Aortic Atherosclerosis (ICD10-I70.0) and Emphysema (ICD10-J43.9). Electronically Signed   By: Alcide Clever M.D.   On: 04/12/2023 21:07   DG Chest 2 View  Result Date: 04/12/2023 CLINICAL DATA:  Chest pain EXAM: CHEST - 2 VIEW  COMPARISON:  04/07/2023 FINDINGS: Cardiac shadow is stable. The lungs are well aerated bilaterally. No focal infiltrate or effusion is seen. No bony abnormality is noted. IMPRESSION: No active cardiopulmonary disease. Electronically Signed   By: Alcide Clever M.D.   On: 04/12/2023 20:38    Procedures .Critical Care  Performed by: Loetta Rough, MD Authorized by: Loetta Rough, MD   Critical care provider statement:    Critical care time (minutes):  60   Critical care was necessary to treat or prevent imminent or life-threatening deterioration of the following conditions:  Shock   Critical care was time spent personally by me on the following activities:  Development of treatment plan with patient or surrogate, discussions with consultants, evaluation of patient's response to treatment, examination of patient, ordering and review of laboratory studies, ordering and review of radiographic studies, ordering and performing treatments and interventions, pulse oximetry, re-evaluation of patient's condition, review of old charts and obtaining history from patient or surrogate   Care discussed with: accepting provider at another facility  EMERGENCY DEPARTMENT [place] CARDIAC EXAM "Study: Limited Ultrasound of the Heart and Pericardium"  INDICATIONS:Abnormal vital signs, Chest pain, and Dyspnea Multiple views of the heart and pericardium were obtained in real-time with a multi-frequency probe.  PERFORMED ZO:XWRUEA IMAGES ARCHIVED?: No LIMITATIONS:  Emergent procedure VIEWS USED: Subcostal 4 chamber, Parasternal long axis, Parasternal short axis, Apical 4 chamber , and Inferior Vena Cava INTERPRETATION: Cardiac activity present, Pericardial effusioin absent, Cardiac tamponade absent, Decreased contractility, and non-collapsible IVC with sniff    EMERGENCY DEPARTMENT ULTRASOUND  Study: Limited Retroperitoneal Ultrasound of the Abdominal Aorta.  INDICATIONS:Abnormal vital signs, Back pain,  Age>55, and chest pain Multiple views of the abdominal aorta were obtained in real-time from the diaphragmatic hiatus to the aortic bifurcation in transverse planes with a multi-frequency probe.  PERFORMED BY: Myself IMAGES ARCHIVED?: No LIMITATIONS:  None INTERPRETATION:  No abdominal aortic aneurysm and no abdominal aortic dissection noted on single suprarenal view of abdominal aorta    Medications Ordered in ED Medications  norepinephrine (LEVOPHED) 4mg  in (0.016 mg/mL) premix infusion (6 mcg/min Intravenous Rate/Dose Change 04/12/23 2234)  aspirin EC tablet 243 mg (243 mg Oral Patient Refused/Not Given 04/12/23 2140)  nitroGLYCERIN (NITROSTAT) 0.4 MG SL tablet (  Not Given 04/12/23 2156)  iohexol (OMNIPAQUE) 350 MG/ML injection 100 mL (100 mLs Intravenous Contrast Given 04/12/23 2050)  fentaNYL (SUBLIMAZE) injection 50 mcg (50 mcg Intravenous Given 04/12/23 2118)  fentaNYL (SUBLIMAZE) 50 MCG/ML injection (50 mcg  Given 04/12/23 2131)  LORazepam (ATIVAN) injection 0.5 mg (0.5 mg Intravenous Given 04/12/23 2137)    ED Course/ Medical Decision Making/ A&P                          Medical Decision Making Amount and/or Complexity of Data Reviewed Labs: ordered. Decision-making details documented in ED Course. Radiology: ordered. Decision-making details documented in ED Course.  Risk OTC drugs. Prescription drug management. Decision regarding hospitalization.    This patient presents to the ED for concern of CP, hypotension; this involves an extensive number of treatment options, and is a complaint that carries with it a high risk of complications and morbidity.  I considered the following differential and admission for this acute, potentially life threatening condition.   MDM:    DDX for chest pain includes but is not limited to:  ACS/arrhythmia, especially concerning given patient's history of extensive CAD and stating that this feels like her similar, also relieved with  nitroglycerin.  Given the severity of her chest pain with hypotension and radiation to her back and left arm must also consider dissection, and will obtain a CT dissection protocol.  She has no hypoxia or hemoptysis to indicate a PE, however she does report shortness of breath with the chest pain, will also evaluate on the CTA.  Consider other causes of chest pain that could be relieved by nitro including esophageal spasm or coronary vasospasm. No abdominal pain and no c/f biliary disease.  Her EKG shows her known left bundle branch block that does not meet Sgarbossa criteria.  For her hypotension, she has been taking nitroglycerin over the last 2 days and it is possible that this is just a side effect of that.  Must also consider acute cardiogenic shock, whether from acute ischemia or acute heart failure.  Must also consider obstructive shock such as from PE or tamponade. Bedside ultrasound performed by me demonstrates poor overall ejection fraction which was already known to be 25 to 30%.  No  elevated right ventricular pressures or McConnell sign to indicate acute PE.  Her thoracic aortic outlet is of normal diameter and does not demonstrate any obvious dissection.  She has no pericardial effusion to indicate tamponade.  She has a plump IVC that does not collapse with sniff inspiration and so therefore I do not believe patient is volume down.  Given her significantly decreased ejection fraction also observed on the ultrasound here live I do not believe she would benefit from further fluid resuscitation for her hypotension and will have low threshold to start pressors for her.  I also did not see any abdominal aortic aneurysm or dissection noted on a single suprarenal view of her abdominal aorta.  Clinical Course as of 04/13/23 0005  Thu Apr 12, 2023  2114 Pt now with acute onset 9.5/10 chest pain and diaphoresis. Nurse hadn't yet started norepi drip and BP now 122/85 likely d/t pain. Patient requesting  another nitroglycerin tablet and I am hesitant d/t blood pressure concerns. Will give 50 mcg fentanyl IV and reassess, if repeat blood pressure still high enough will give, have paged cards [HN]  2131 Troponin I (High Sensitivity): 10 [HN]  2131 D/w Dr. Daphine Deutscher cardiology who after listening to history and looking at previous cath results and trop 10 today, questions whether she is having unstable angina. Patient given 50 mcg fentanyl which didn't help. Will give additional fentanyl as well as small dose IV ativan for anxiety. 2nd trop just drawn.  [HN]  2132 CT Angio Chest/Abd/Pel for Dissection W and/or Wo Contrast CTA of the chest: No evidence of aortic abnormality. No pulmonary emboli are seen.  CTA of the abdomen and pelvis: No aortic abnormality is noted. No acute abnormality is seen.   [HN]  2152 Troponin I (High Sensitivity): 14 Flat trop, severe chest pain still, now hypotensive, will consult to ICU on norepi [HN]    Clinical Course User Index [HN] Loetta Rough, MD    After IV analgesia and small dose of IV ativan, patient's pain improved.  She became hypoxic to 89% and was placed on 2 L nasal cannula.  Her CT chest abdomen pelvis is negative and her second troponin is flat.  I did not give her any additional nitroglycerin and after her pain became controlled she became hypotensive again into the 60s systolic.  I have started nitroglycerin, and titrated to MAP greater than 65 mmHg, which her ultimate dose she required was 5 mcg/min.  Due to her persistent hypotension requiring IV pressors which could be due to the nitroglycerin, also could consider acute heart failure or cardiogenic shock, patient is consulted to critical care and admitted to the ICU with Dr. Merrily Pew.     Labs: I Ordered, and personally interpreted labs.  The pertinent results include:  those listed above  Imaging Studies ordered: I ordered imaging studies including CXR, CT dissection I independently visualized  and interpreted imaging. I agree with the radiologist interpretation  Additional history obtained from chart review.    Cardiac Monitoring: The patient was maintained on a cardiac monitor.  I personally viewed and interpreted the cardiac monitored which showed an underlying rhythm of: NSR  Reevaluation: After the interventions noted above, I reevaluated the patient and found that they have :improved  Social Determinants of Health: Patient lives independently   Disposition:  [facility]  Co morbidities that complicate the patient evaluation  Past Medical History:  Diagnosis Date   Alcohol use    CAD (coronary artery  disease)    Hyperlipidemia, mixed    Hypothyroidism    Ischemic cardiomyopathy    Tobacco abuse      Medicines Meds ordered this encounter  Medications   iohexol (OMNIPAQUE) 350 MG/ML injection 100 mL   norepinephrine (LEVOPHED) 4mg  in (0.016 mg/mL) premix infusion   fentaNYL (SUBLIMAZE) injection 50 mcg   aspirin EC tablet 243 mg   nitroGLYCERIN (NITROSTAT) 0.4 MG SL tablet    Melynda Keller R: cabinet override   fentaNYL (SUBLIMAZE) 50 MCG/ML injection    Melynda Keller R: cabinet override   LORazepam (ATIVAN) injection 0.5 mg    I have reviewed the patients home medicines and have made adjustments as needed  Problem List / ED Course: Problem List Items Addressed This Visit       Cardiovascular and Mediastinum   * (Principal) Hypotension   Relevant Medications   norepinephrine (LEVOPHED) 4mg  in (0.016 mg/mL) premix infusion   aspirin EC tablet 243 mg   nitroGLYCERIN (NITROSTAT) 0.4 MG SL tablet     Other   CHEST PAIN-UNSPECIFIED - Primary                This note was created using dictation software, which may contain spelling or grammatical errors.    Loetta Rough, MD 04/13/23 [phone]

## 2023-04-13 ENCOUNTER — Other Ambulatory Visit (HOSPITAL_COMMUNITY): Payer: Self-pay

## 2023-04-13 ENCOUNTER — Inpatient Hospital Stay (HOSPITAL_COMMUNITY): Payer: Medicaid Other

## 2023-04-13 DIAGNOSIS — J449 Chronic obstructive pulmonary disease, unspecified: Secondary | ICD-10-CM | POA: Diagnosis not present

## 2023-04-13 DIAGNOSIS — I5022 Chronic systolic (congestive) heart failure: Secondary | ICD-10-CM

## 2023-04-13 DIAGNOSIS — I25110 Atherosclerotic heart disease of native coronary artery with unstable angina pectoris: Secondary | ICD-10-CM | POA: Diagnosis not present

## 2023-04-13 DIAGNOSIS — R609 Edema, unspecified: Secondary | ICD-10-CM | POA: Diagnosis not present

## 2023-04-13 DIAGNOSIS — E7800 Pure hypercholesterolemia, unspecified: Secondary | ICD-10-CM | POA: Diagnosis not present

## 2023-04-13 DIAGNOSIS — I9589 Other hypotension: Secondary | ICD-10-CM | POA: Diagnosis present

## 2023-04-13 DIAGNOSIS — R079 Chest pain, unspecified: Secondary | ICD-10-CM | POA: Diagnosis not present

## 2023-04-13 LAB — BASIC METABOLIC PANEL
Anion gap: 12 (ref 5–15)
BUN: 11 mg/dL (ref 6–20)
CO2: 22 mmol/L (ref 22–32)
Calcium: 9.2 mg/dL (ref 8.9–10.3)
Chloride: 103 mmol/L (ref 98–111)
Creatinine, Ser: 0.86 mg/dL (ref 0.44–1.00)
GFR, Estimated: >60 mL/min (ref >60)
Glucose, Bld: 100 mg/dL — ABNORMAL HIGH (ref 70–99)
Potassium: 3.7 mmol/L (ref 3.5–5.1)
Sodium: 137 mmol/L (ref 135–145)

## 2023-04-13 LAB — URINALYSIS, ROUTINE W REFLEX MICROSCOPIC
Bilirubin Urine: NEGATIVE
Glucose, UA: 150 mg/dL — AB
Ketones, ur: NEGATIVE mg/dL
Nitrite: NEGATIVE
Protein, ur: NEGATIVE mg/dL
Specific Gravity, Urine: 1.015 (ref 1.005–1.030)
pH: 5 (ref 5.0–8.0)

## 2023-04-13 LAB — PROCALCITONIN: Procalcitonin: <0.1 ng/mL

## 2023-04-13 LAB — CBC WITH DIFFERENTIAL/PLATELET
Abs Immature Granulocytes: 0.06 10*3/uL (ref 0.00–0.07)
Basophils Absolute: 0.2 10*3/uL — ABNORMAL HIGH (ref 0.0–0.1)
Basophils Relative: 1 %
Eosinophils Absolute: 0.4 10*3/uL (ref 0.0–0.5)
Eosinophils Relative: 3 %
HCT: 39.5 % (ref 36.0–46.0)
Hemoglobin: 12.8 g/dL (ref 12.0–15.0)
Immature Granulocytes: 0 %
Lymphocytes Relative: 15 %
Lymphs Abs: 2.2 10*3/uL (ref 0.7–4.0)
MCH: 27.8 pg (ref 26.0–34.0)
MCHC: 32.4 g/dL (ref 30.0–36.0)
MCV: 85.7 fL (ref 80.0–100.0)
Monocytes Absolute: 1.9 10*3/uL — ABNORMAL HIGH (ref 0.1–1.0)
Monocytes Relative: 13 %
Neutro Abs: 10 10*3/uL — ABNORMAL HIGH (ref 1.7–7.7)
Neutrophils Relative %: 68 %
Platelets: 383 10*3/uL (ref 150–400)
RBC: 4.61 MIL/uL (ref 3.87–5.11)
RDW: 15.2 % (ref 11.5–15.5)
WBC: 14.8 10*3/uL — ABNORMAL HIGH (ref 4.0–10.5)
nRBC: 0 % (ref 0.0–0.2)

## 2023-04-13 LAB — CBC
HCT: 41.6 % (ref 36.0–46.0)
Hemoglobin: 13.8 g/dL (ref 12.0–15.0)
MCH: 28.6 pg (ref 26.0–34.0)
MCHC: 33.2 g/dL (ref 30.0–36.0)
MCV: 86.1 fL (ref 80.0–100.0)
Platelets: 384 10*3/uL (ref 150–400)
RBC: 4.83 MIL/uL (ref 3.87–5.11)
RDW: 15.1 % (ref 11.5–15.5)
WBC: 17 10*3/uL — ABNORMAL HIGH (ref 4.0–10.5)
nRBC: 0 % (ref 0.0–0.2)

## 2023-04-13 LAB — TROPONIN I (HIGH SENSITIVITY): Troponin I (High Sensitivity): 41 ng/L — ABNORMAL HIGH (ref <18)

## 2023-04-13 LAB — EXPECTORATED SPUTUM ASSESSMENT W GRAM STAIN, RFLX TO RESP C

## 2023-04-13 LAB — ECHOCARDIOGRAM LIMITED
Height: 62 in
S' Lateral: 5.2 cm
Weight: 2045.87 oz

## 2023-04-13 LAB — PHOSPHORUS: Phosphorus: 3.7 mg/dL (ref 2.5–4.6)

## 2023-04-13 LAB — MRSA NEXT GEN BY PCR, NASAL: MRSA by PCR Next Gen: NOT DETECTED

## 2023-04-13 LAB — MAGNESIUM: Magnesium: 1.8 mg/dL (ref 1.7–2.4)

## 2023-04-13 MED ORDER — DOXYCYCLINE HYCLATE 100 MG PO TABS
100.0000 mg | ORAL_TABLET | Freq: Two times a day (BID) | ORAL | Status: DC
  Administered 2023-04-13 – 2023-04-17 (×9): 100 mg via ORAL
  Filled 2023-04-13 (×9): qty 1

## 2023-04-13 MED ORDER — LACTATED RINGERS IV BOLUS
500.0000 mL | Freq: Once | INTRAVENOUS | Status: AC
  Administered 2023-04-13: 500 mL via INTRAVENOUS

## 2023-04-13 MED ORDER — REVEFENACIN 175 MCG/3ML IN SOLN
175.0000 ug | Freq: Every day | RESPIRATORY_TRACT | Status: DC
  Administered 2023-04-13 – 2023-04-17 (×4): 175 ug via RESPIRATORY_TRACT
  Filled 2023-04-13 (×4): qty 3

## 2023-04-13 MED ORDER — ASPIRIN 81 MG PO CHEW
81.0000 mg | CHEWABLE_TABLET | Freq: Every day | ORAL | Status: DC
  Administered 2023-04-13 – 2023-04-17 (×5): 81 mg via ORAL
  Filled 2023-04-13 (×5): qty 1

## 2023-04-13 MED ORDER — ENOXAPARIN SODIUM 40 MG/0.4ML IJ SOSY
40.0000 mg | PREFILLED_SYRINGE | INTRAMUSCULAR | Status: DC
  Administered 2023-04-13 – 2023-04-15 (×3): 40 mg via SUBCUTANEOUS
  Filled 2023-04-13 (×3): qty 0.4

## 2023-04-13 MED ORDER — POTASSIUM CHLORIDE CRYS ER 20 MEQ PO TBCR
40.0000 meq | EXTENDED_RELEASE_TABLET | Freq: Three times a day (TID) | ORAL | Status: AC
  Administered 2023-04-13 (×2): 40 meq via ORAL
  Filled 2023-04-13 (×2): qty 2

## 2023-04-13 MED ORDER — PREDNISONE 20 MG PO TABS
40.0000 mg | ORAL_TABLET | Freq: Every day | ORAL | Status: DC
  Administered 2023-04-14 – 2023-04-17 (×4): 40 mg via ORAL
  Filled 2023-04-13 (×4): qty 2

## 2023-04-13 MED ORDER — BUDESONIDE 0.5 MG/2ML IN SUSP
0.5000 mg | Freq: Two times a day (BID) | RESPIRATORY_TRACT | Status: DC
  Administered 2023-04-13 – 2023-04-17 (×8): 0.5 mg via RESPIRATORY_TRACT
  Filled 2023-04-13 (×8): qty 2

## 2023-04-13 MED ORDER — FUROSEMIDE 10 MG/ML IJ SOLN
20.0000 mg | Freq: Once | INTRAMUSCULAR | Status: AC
  Administered 2023-04-13: 20 mg via INTRAVENOUS
  Filled 2023-04-13: qty 2

## 2023-04-13 MED ORDER — ACETAMINOPHEN 325 MG PO TABS
650.0000 mg | ORAL_TABLET | Freq: Four times a day (QID) | ORAL | Status: DC | PRN
  Administered 2023-04-13 – 2023-04-14 (×2): 650 mg via ORAL
  Filled 2023-04-13 (×2): qty 2

## 2023-04-13 MED ORDER — ARFORMOTEROL TARTRATE 15 MCG/2ML IN NEBU
15.0000 ug | INHALATION_SOLUTION | Freq: Two times a day (BID) | RESPIRATORY_TRACT | Status: DC
  Administered 2023-04-13 – 2023-04-17 (×8): 15 ug via RESPIRATORY_TRACT
  Filled 2023-04-13 (×8): qty 2

## 2023-04-13 MED ORDER — ROSUVASTATIN CALCIUM 20 MG PO TABS
40.0000 mg | ORAL_TABLET | Freq: Every day | ORAL | Status: DC
  Administered 2023-04-13 – 2023-04-17 (×5): 40 mg via ORAL
  Filled 2023-04-13 (×5): qty 2

## 2023-04-13 MED ORDER — POLYETHYLENE GLYCOL 3350 17 G PO PACK: 17.0000 g | PACK | Freq: Every day | ORAL | Status: DC | PRN

## 2023-04-13 MED ORDER — DOCUSATE SODIUM 100 MG PO CAPS: 100.0000 mg | ORAL_CAPSULE | Freq: Two times a day (BID) | ORAL | Status: DC | PRN

## 2023-04-13 MED ORDER — MAGNESIUM SULFATE 2 GM/50ML IV SOLN
2.0000 g | Freq: Once | INTRAVENOUS | Status: AC
  Administered 2023-04-13: 2 g via INTRAVENOUS
  Filled 2023-04-13: qty 50

## 2023-04-13 MED ORDER — TICAGRELOR 90 MG PO TABS
90.0000 mg | ORAL_TABLET | Freq: Two times a day (BID) | ORAL | Status: DC
  Administered 2023-04-13 – 2023-04-17 (×11): 90 mg via ORAL
  Filled 2023-04-13 (×11): qty 1

## 2023-04-13 MED ORDER — CHLORHEXIDINE GLUCONATE CLOTH 2 % EX PADS
6.0000 | MEDICATED_PAD | Freq: Every day | CUTANEOUS | Status: DC
  Administered 2023-04-14 – 2023-04-17 (×4): 6 via TOPICAL

## 2023-04-13 NOTE — Progress Notes (Signed)
Lower ext venous duplex study  has been completed. Refer to [facility] under chart review to view preliminary results.   04/13/2023  11:02 AM Whitney May, Whitney May

## 2023-04-13 NOTE — ED Provider Notes (Incomplete)
[place] EMERGENCY DEPARTMENT AT [facility] Provider Note   CSN: [id number] Arrival date & time: 04/12/23  1937     History {Add pertinent medical, surgical, social history, OB history to HPI:1} Chief Complaint  Patient presents with  . Chest Pain    Whitney May is a 62 y.o. female with CAD w/ h/o NSTEMI 2019/2020 and STEMI 2023, acute systolic HF (EF 25-30%), hypothyroidism, HLD, h/o pericardial effusion, h/o PAD w/ long occlusion L SFA, h/o heavy EtOH use who presents with CP.   Pt c/o chest pain x 30 minutes. Ems gave nitro and pt took asa at home. Pt states chest pain is better since nitro.   Pt last saw cardiology 01/08/23, still +tobacco use,    Chest Pain      Home Medications Prior to Admission medications   Medication Sig Start Date End Date Taking? Authorizing Provider  aspirin 81 MG chewable tablet Chew 1 tablet (81 mg total) by mouth daily. 10/06/22   Milford, Anderson Malta, FNP  carvedilol (COREG) 3.125 MG tablet Take 1 tablet (3.125 mg total) by mouth 2 (two) times daily. 01/08/23   Laurey Morale, MD  empagliflozin (JARDIANCE) 10 MG TABS tablet Take 1 tablet (10 mg total) by mouth daily before breakfast. 10/06/22   Milford, Anderson Malta, FNP  furosemide (LASIX) 20 MG tablet Take 1 tablet (20 mg total) by mouth daily as needed for fluid. 11/07/22   Laurey Morale, MD  levothyroxine (SYNTHROID) 25 MCG tablet Take 1 tablet (25 mcg total) by mouth daily at 6 (six) AM. 09/21/22   Alen Bleacher, NP  losartan (COZAAR) 25 MG tablet Take 1 tablet (25 mg total) by mouth daily. 01/08/23   Laurey Morale, MD  Multiple Vitamin (MULTIVITAMIN WITH MINERALS) TABS tablet Take 1 tablet by mouth daily. 09/25/15   Hongalgi, Maximino Greenland, MD  nitroGLYCERIN (NITROSTAT) 0.4 MG SL tablet Place 1 tablet (0.4 mg total) under the tongue every 5 (five) minutes x 3 doses as needed for chest pain. 11/07/22   Laurey Morale, MD  Omega-3 Fatty Acids (FISH OIL) 1200 MG CAPS Take 1,200 mg by mouth  daily.    [provider]  potassium chloride SA (KLOR-CON M) 20 MEQ tablet Take 1 tablet (20 mEq total) by mouth daily. 01/09/23   Laurey Morale, MD  rosuvastatin (CRESTOR) 40 MG tablet Take 1 tablet (40 mg total) by mouth daily. 01/08/23   Laurey Morale, MD  spironolactone (ALDACTONE) 25 MG tablet Take 1 tablet (25 mg total) by mouth daily. 01/08/23   Laurey Morale, MD  ticagrelor (BRILINTA) 90 MG TABS tablet Take 1 tablet (90 mg total) by mouth 2 (two) times daily. 11/08/22   Jacklynn Ganong, FNP      Allergies    Penicillins    Review of Systems   Review of Systems  Cardiovascular:  Positive for chest pain.   Review of systems {pos/neg:18640::"Negative","Positive"} for ***.  A 10 point review of systems was performed and is negative unless otherwise reported in HPI.  Physical Exam Updated Vital Signs Ht 5\' 2"  (1.575 m)   Wt 56 kg   BMI 22.58 kg/m  Physical Exam General: Normal appearing {Desc; female/female:11659}, lying in bed.  HEENT: PERRLA, Sclera anicteric, MMM, trachea midline.  Cardiology: RRR, no murmurs/rubs/gallops. BL radial and DP pulses equal bilaterally.  Resp: Normal respiratory rate and effort. CTAB, no wheezes, rhonchi, crackles.  Abd: Soft, non-tender, non-distended. No rebound tenderness or guarding.  GU: Deferred. MSK: No peripheral edema or signs of trauma. Extremities without deformity or TTP. No cyanosis or clubbing. Skin: warm, dry. No rashes or lesions. Back: No CVA tenderness Neuro: A&Ox4, CNs II-XII grossly intact. MAEs. Sensation grossly intact.  Psych: Normal mood and affect.   ED Results / Procedures / Treatments   Labs (all labs ordered are listed, but only abnormal results are displayed) Labs Reviewed - No data to display  EKG None  Radiology No results found.  Procedures Procedures  {Document cardiac monitor, telemetry assessment procedure when appropriate:1}  Medications Ordered in ED Medications - No data to  display  ED Course/ Medical Decision Making/ A&P                          Medical Decision Making Amount and/or Complexity of Data Reviewed Labs: ordered. Decision-making details documented in ED Course. Radiology: ordered. Decision-making details documented in ED Course.  Risk OTC drugs. Prescription drug management. Decision regarding hospitalization.    This patient presents to the ED for concern of ***, this involves an extensive number of treatment options, and is a complaint that carries with it a high risk of complications and morbidity.  I considered the following differential and admission for this acute, potentially life threatening condition.   MDM:    ***  Clinical Course as of 04/13/23 0003  Thu Apr 12, 2023  2114 Pt now with acute onset 9.5/10 chest pain and diaphoresis. Nurse hadn't yet started norepi drip and BP now 122/85 likely d/t pain. Patient requesting another nitroglycerin tablet and I am hesitant d/t blood pressure concerns. Will give 50 mcg fentanyl IV and reassess, if repeat blood pressure still high enough will give, have paged cards [HN]  2131 Troponin I (High Sensitivity): 10 [HN]  2131 D/w Dr. Daphine Deutscher cardiology who after listening to history and looking at previous cath results and trop 10 today, questions whether she is having unstable angina. Patient given 50 mcg fentanyl which didn't help. Will give additional fentanyl as well as small dose IV ativan for anxiety. 2nd trop just drawn.  [HN]  2132 CT Angio Chest/Abd/Pel for Dissection W and/or Wo Contrast CTA of the chest: No evidence of aortic abnormality. No pulmonary emboli are seen.  CTA of the abdomen and pelvis: No aortic abnormality is noted. No acute abnormality is seen.   [HN]  2152 Troponin I (High Sensitivity): 14 Flat trop, severe chest pain still, now hypotensive, will consult to ICU on norepi [HN]    Clinical Course User Index [HN] Loetta Rough, MD    Labs: I Ordered, and  personally interpreted labs.  The pertinent results include:  ***  Imaging Studies ordered: I ordered imaging studies including *** I independently visualized and interpreted imaging. I agree with the radiologist interpretation  Additional history obtained from ***.  External records from outside source obtained and reviewed including ***  Cardiac Monitoring: .The patient was maintained on a cardiac monitor.  I personally viewed and interpreted the cardiac monitored which showed an underlying rhythm of: ***  Reevaluation: After the interventions noted above, I reevaluated the patient and found that they have :{resolved/improved/worsened:23923::"improved"}  Social Determinants of Health: .***  Disposition:  ***  Co morbidities that complicate the patient evaluation . Past Medical History:  Diagnosis Date  . Alcohol use   . CAD (coronary artery disease)   . Hyperlipidemia, mixed   . Hypothyroidism   . Ischemic cardiomyopathy   . Tobacco  abuse      Medicines No orders of the defined types were placed in this encounter.   I have reviewed the patients home medicines and have made adjustments as needed  Problem List / ED Course: Problem List Items Addressed This Visit   None        {Document critical care time when appropriate:1} {Document review of labs and clinical decision tools ie heart score, Chads2Vasc2 etc:1}  {Document your independent review of radiology images, and any outside records:1} {Document your discussion with family members, caretakers, and with consultants:1} {Document social determinants of health affecting pt's care:1} {Document your decision making why or why not admission, treatments were needed:1}  This note was created using dictation software, which may contain spelling or grammatical errors.

## 2023-04-13 NOTE — H&P (Signed)
NAME:  Whitney May, MRN:  [id number], DOB:  Jun 15, 1961, LOS: 1 ADMISSION DATE:  04/12/2023, CONSULTATION DATE:  04/13/2023 REFERRING MD:  Loetta Rough, MD , CHIEF COMPLAINT: Chest pain  History of Present Illness:  62 year old female with coronary artery disease s/p NSTEMI in 2020 and STEMI in 2023 on dual antiplatelet agents, hyperlipidemia, chronic biventricular HFrEF and active tobacco dependence who was brought into the emergency department at [facility] with complaint of chest pain which started earlier, she took her meds at that time and took nitroglycerin x 3, started feeling dizzy and with continued chest pain she got scared and came to the emergency department. In the ED she was noted to be hypotensive with systolic blood pressure in the 80s, was started on Levophed.  EKG was done which was negative for acute findings showing LBBB which is chronic, serum troponin remained negative.  CTA chest dissection protocol was done which was negative as patient continued to complain of chest pain.  Cardiology was consulted, they recommended no intervention. As patient continued to require vasopressor support with Levophed, PCCM was consulted for help evaluation and transfer to [facility], ICU.  On further history patient has MMRC 1 dyspnea, increased cough with green sputum, wheezing.  Ongoing smoking but she has cut back.  Coughing does awaken her from sleep and seems that this preceded her chest pains above.  Pertinent  Medical History   Past Medical History:  Diagnosis Date   Alcohol use    CAD (coronary artery disease)    Hyperlipidemia, mixed    Hypothyroidism    Ischemic cardiomyopathy    Tobacco abuse      Significant Hospital Events: Including procedures, antibiotic start and stop dates in addition to other pertinent events     Interim History / Subjective:  Consulted  Objective   Blood pressure (!) 104/58, pulse 67, temperature 98 F (36.7 C), temperature source Oral,  resp. rate 18, height 5\' 2"  (1.575 m), weight 58 kg, SpO2 95 %.        Intake/Output Summary (Last 24 hours) at 04/13/2023 0110 Last data filed at 04/13/2023 0054 Gross per 24 hour  Intake 70.48 ml  Output --  Net 70.48 ml   Filed Weights   04/12/23 1942 04/13/23 0044  Weight: 56 kg 58 kg    Examination: General: no distress HENT: MMM, trachea midline Lungs: congested upper airway sounds, no accessory muscle use Cardiovascular: RRR, ext warm Abdomen: soft, +BS Extremities: No edema Neuro: Moves to command Skin: no rashes  CXR clear Leukocytosis  Resolved Hospital Problem list     Assessment & Plan:  Chest pain, likely muscular skeletal in origin Medication related hypotension Coronary artery disease status post stent Chronic biventricular HFrEF Tobacco dependence Hypokalemia Leukocytosis: do not have a good cause for this Acute on chronic hypotension/shock state- worsened by taking too many PTA BP meds  Hx and symptoms more suggestive of bronchitis/AECOPD leading to pleurisy  - Bronchodilators, steroids, doxycycline - LE duplex, limited echo - 500 cc bolus - Check differential, UA, blood cultures - Smoking cessation counseling - Needs PFTs and OP pulm f/u - Levophed for MAP 65  Best Practice (right click and "Reselect all SmartList Selections" daily)   Diet/type: Regular consistency (see orders) DVT prophylaxis: LMWH GI prophylaxis: N/A Lines: N/A Foley:  N/A Code Status:  full code Last date of multidisciplinary goals of care discussion [6/7: Patient was updated at bedside, she decided to continue full scope of care]  Labs  CBC: Recent Labs  Lab 04/12/23 1954  WBC 12.9*  HGB 13.2  HCT 40.2  MCV 87.0  PLT 316    Basic Metabolic Panel: Recent Labs  Lab 04/12/23 1954  NA 137  K 3.0*  CL 105  CO2 25  GLUCOSE 109*  BUN 11  CREATININE 0.88  CALCIUM 8.8*  MG 1.9   GFR: Estimated Creatinine Clearance: 53.8 mL/min (by C-G formula based on  SCr of 0.88 mg/dL). Recent Labs  Lab 04/12/23 1954  WBC 12.9*    Liver Function Tests: Recent Labs  Lab 04/12/23 1954  AST 24  ALT 28  ALKPHOS 71  BILITOT 0.5  PROT 6.4*  ALBUMIN 3.0*   No results for input(s): "LIPASE", "AMYLASE" in the last 168 hours. No results for input(s): "AMMONIA" in the last 168 hours.  ABG    Component Value Date/Time   TCO2 24 09/18/2022 1507     Coagulation Profile: No results for input(s): "INR", "PROTIME" in the last 168 hours.  Cardiac Enzymes: No results for input(s): "CKTOTAL", "CKMB", "CKMBINDEX", "TROPONINI" in the last 168 hours.  HbA1C: Hgb A1c MFr Bld  Date/Time Value Ref Range Status  09/18/2022 03:13 PM 5.3 4.8 - 5.6 % Final    Comment:    (NOTE) Pre diabetes:          5.7%-6.4%  Diabetes:              >6.4%  Glycemic control for   <7.0% adults with diabetes   08/02/2018 05:33 PM 5.5 4.8 - 5.6 % Final    Comment:    (NOTE) Pre diabetes:          5.7%-6.4% Diabetes:              >6.4% Glycemic control for   <7.0% adults with diabetes     CBG: No results for input(s): "GLUCAP" in the last 168 hours.  Review of Systems:   12 point review of systems significant for complaint mentioned HPI, rest negative  Past Medical History:  She,  has a past medical history of Alcohol use, CAD (coronary artery disease), Hyperlipidemia, mixed, Hypothyroidism, Ischemic cardiomyopathy, and Tobacco abuse.   Surgical History:   Past Surgical History:  Procedure Laterality Date   CESAREAN SECTION     CORONARY STENT INTERVENTION N/A 03/07/2019   Procedure: CORONARY STENT INTERVENTION;  Surgeon: Tonny Bollman, MD;  Location: [facility] INVASIVE CV LAB;  Service: Cardiovascular;  Laterality: N/A;   CORONARY/GRAFT ACUTE MI REVASCULARIZATION N/A 08/02/2018   Procedure: Coronary/Graft Acute MI Revascularization;  Surgeon: Marykay Lex, MD;  Location: [facility] INVASIVE CV LAB;  Service: Cardiovascular;  Laterality: N/A;   CORONARY/GRAFT ACUTE MI  REVASCULARIZATION N/A 09/18/2022   Procedure: Coronary/Graft Acute MI Revascularization;  Surgeon: Runell Gess, MD;  Location: [facility] INVASIVE CV LAB;  Service: Cardiovascular;  Laterality: N/A;   LEFT HEART CATH AND CORONARY ANGIOGRAPHY N/A 08/02/2018   Procedure: LEFT HEART CATH AND CORONARY ANGIOGRAPHY;  Surgeon: Marykay Lex, MD;  Location: [facility] INVASIVE CV LAB;  Service: Cardiovascular;  Laterality: N/A;   LEFT HEART CATH AND CORONARY ANGIOGRAPHY N/A 03/07/2019   Procedure: LEFT HEART CATH AND CORONARY ANGIOGRAPHY;  Surgeon: Dolores Patty, MD;  Location: [facility] INVASIVE CV LAB;  Service: Cardiovascular;  Laterality: N/A;   LEFT HEART CATH AND CORONARY ANGIOGRAPHY N/A 09/18/2022   Procedure: LEFT HEART CATH AND CORONARY ANGIOGRAPHY;  Surgeon: Runell Gess, MD;  Location: [facility] INVASIVE CV LAB;  Service: Cardiovascular;  Laterality: N/A;  Social History:   reports that she quit smoking about 4 years ago. Her smoking use included cigarettes. She has a 22.00 pack-year smoking history. She has never used smokeless tobacco. She reports current alcohol use of about 8.0 standard drinks of alcohol per week. She reports that she does not use drugs.   Family History:  Her family history includes Coronary artery disease in her maternal grandmother and mother; Diabetes in her father; Hypertension in her father.   Allergies Allergies  Allergen Reactions   Penicillins Other (See Comments)    Fever, vomiting Has patient had a PCN reaction causing immediate rash, facial/tongue/throat swelling, SOB or lightheadedness with hypotension: NO Has patient had a PCN reaction causing severe rash involving mucus membranes or skin necrosis: NO Has patient had a PCN reaction that required hospitalization: NO Has patient had a PCN reaction occurring within the last 10 years: NO If all of the above answers are "NO", then may proceed Childhood allergy     Home Medications  Prior to Admission medications    Medication Sig Start Date End Date Taking? Authorizing Provider  aspirin 81 MG chewable tablet Chew 1 tablet (81 mg total) by mouth daily. 10/06/22  Yes Milford, Anderson Malta, FNP  benzonatate (TESSALON) 100 MG capsule Take 100 mg by mouth every 8 (eight) hours.   Yes [provider]  carvedilol (COREG) 3.125 MG tablet Take 1 tablet (3.125 mg total) by mouth 2 (two) times daily. 01/08/23  Yes Laurey Morale, MD  empagliflozin (JARDIANCE) 10 MG TABS tablet Take 1 tablet (10 mg total) by mouth daily before breakfast. 10/06/22  Yes Milford, Anderson Malta, FNP  furosemide (LASIX) 20 MG tablet Take 1 tablet (20 mg total) by mouth daily as needed for fluid. 11/07/22  Yes Laurey Morale, MD  levothyroxine (SYNTHROID) 137 MCG tablet Take 137 mcg by mouth every morning.   Yes [provider]  losartan (COZAAR) 25 MG tablet Take 1 tablet (25 mg total) by mouth daily. 01/08/23  Yes Laurey Morale, MD  Multiple Vitamin (MULTIVITAMIN WITH MINERALS) TABS tablet Take 1 tablet by mouth daily. 09/25/15  Yes Hongalgi, Maximino Greenland, MD  nitrofurantoin, macrocrystal-monohydrate, (MACROBID) 100 MG capsule Take 100 mg by mouth 2 (two) times daily. 7 days supply 04/08/23  Yes [provider]  nitroGLYCERIN (NITROSTAT) 0.4 MG SL tablet Place 1 tablet (0.4 mg total) under the tongue every 5 (five) minutes x 3 doses as needed for chest pain. 11/07/22  Yes Laurey Morale, MD  Omega-3 Fatty Acids (FISH OIL) 1200 MG CAPS Take 1,200 mg by mouth daily.   Yes [provider]  potassium chloride SA (KLOR-CON M) 20 MEQ tablet Take 1 tablet (20 mEq total) by mouth daily. Patient taking differently: Take 20 mEq by mouth 2 (two) times daily. For 5 days 01/09/23  Yes Laurey Morale, MD  rosuvastatin (CRESTOR) 40 MG tablet Take 1 tablet (40 mg total) by mouth daily. 01/08/23  Yes Laurey Morale, MD  spironolactone (ALDACTONE) 25 MG tablet Take 1 tablet (25 mg total) by mouth daily. 01/08/23  Yes Laurey Morale, MD   ticagrelor (BRILINTA) 90 MG TABS tablet Take 1 tablet (90 mg total) by mouth 2 (two) times daily. 11/08/22  Yes Milford, Anderson Malta, FNP  azithromycin (ZITHROMAX) 250 MG tablet TAKE 2 TABLETS BY MOUTH ON DAY 1, THEN TAKE 1 TABLET DAILY ON DAYS 2-5 Patient not taking: Reported on 04/12/2023 03/31/23   [provider]  Critical care time: 33 mins

## 2023-04-13 NOTE — Progress Notes (Signed)
eLink Physician-Brief Progress Note Patient Name: Whitney May DOB: 08/19/1961 MRN: [id number]   Date of Service  04/13/2023  HPI/Events of Note  Whitney May is a 62 y.o. female with CAD w/ h/o NSTEMI 2019/2020 and STEMI 2023, acute systolic HF (EF 25-30%), hypothyroidism, HLD, h/o pericardial effusion, h/o PAD w/ long occlusion L SFA, h/o heavy EtOH use who presents with chest pain. Patient had persistent chest pain and in the setting of her history and a left bundle branch block without Sgarbossa criteria, she was referred to cardiology.  She has been taking nitroglycerin repeatedly in believe that she is in acute cardiogenic shock and referred to critical care for further evaluation.  Initially presented tachypneic and hypotensive saturating 92% on room air and eventually started on norepinephrine.  Initial labs show some electrolyte disturbances and CBC shows mild leukocytosis.  CT angiography of the chest/abdomen/pelvis without acute findings.  ECGs with left bundle branch block.  Ground team evaluation consistent possible costochondritis.  eICU Interventions  Maintaining dual antiplatelet therapy with Brilinta and aspirin  Receiving prophylactic enoxaparin for DVTs  Analgesia with as needed Tylenol, previously received intermittent fentanyl with good effect.  If Tylenol is ineffective, could consider Robaxin.  GI prophylaxis not indicated.     Intervention Category Evaluation Type: New Patient Evaluation  Whitney May 04/13/2023, 1:26 AM

## 2023-04-13 NOTE — Consult Note (Addendum)
Cardiology Consultation   Patient ID: FRONNIE SCHEULEN MRN: [id number]; DOB: [birth year]  Admit date: 04/12/2023 Date of Consult: 04/13/2023  PCP:  Shelby Dubin, FNP   [facility] Providers Cardiologist:  Dr. Shirlee Latch  Patient Profile:   Whitney May is a 62 y.o. female with a history of CAD (NSTEMI in 07/2018 s/p PCI with DES to RCA, NSTEMI in 03/2019 due to in-stent restenosis of RCA with overlapping DES x2, and STEMI in 09/2022 s/p DES to ostial PLA/ PDA branches and PTCA of the entire previously stented RCA), ischemic cardiomyopathy/ chronic HFrEF of 25-30% with global hypokinesis, PAD with long occlusion of left SFA, hyperlipidemia, hypothyroidism, and tobacco abuse   who is being seen 04/13/2023 for the evaluation of chest pain at the request of Dr. Katrinka Blazing.  History of Present Illness:   Whitney May is a 62 year old female with the above history who is followed by Dr. Shirlee Latch. Patient has history of CAD. She presented in 07/2018 with NSTEMI and was found to have an 100% mid RCA thrombotic occlusion which was treated with DES. Echo at that time showed LVEF of 35-40% with akinesis of the inferolateral and inferior myocardium, grade 2 diastolic dysfunction, and mild to moderate MR. GDMT limited due to soft BP. EF later improved to 50-55% on repeat Echo in 09/2018. She was readmitted in 03/2019 with NSTEMI and found to have severe in-stent restenosis/ plaque rupture of previously placed RCA stent and underwent successful PCI with overlapping DES x2. Outpatient Echo shortly after this admission showed LVEF of 30-35% with hypokinesis of the anteroseptal, inferoseptal, and inferior walls. She was admitted again in 09/2022 for inferior STEMI after the patient had stopped her Plavix several days prior. LHC at that time showed diffuse 90% in-stent restenosis of the entire previously placed RCA stent, 99% stenosis of RPDA and 95% stenosis of RPAV as well as 50% stenosis of ostial to distal left main. She  underwent successful PCI with DES to the PLA and PDA branches and PTCA of the entire previously stented RCA. Echo showed LVEF of 20-25% with severe hypokinesis of the entire anterior and inferior walls as well as moderate hypokinesis of the entire lateral wall, normal RV, and mild MR. The Advanced CHF team was consulted and she was started on GDMT and discharged with a LifeVest. Repeat Echo in 01/2023 showed LVEF of 25-30% with global hypokinesis with septal-lateral dyssynchrony consistent with LBBB. She was last seen by Dr. Shirlee Latch in 01/2023 she reported dyspnea when walking up an incline but no dyspnea when walking on a flat surface. She also reported lightheadedness with standing. She was no longer wearing the LifeVest and she was still smoking. BP was soft at that visit at 92/58. She was advised to start taking Spironolactone and Losartan at night. Given persistently low EF, she qualified for CRT-D placement but did not have any insurance coverage. Social work was going to help her get Medicaid and then plan was for CRT-D.  Patient presented to [facility] ED on 04/12/2023 for further evaluation of chest pain.  Patient reports intermittent chest pain for the last several weeks occurs at rest.  She states it starts as a tingling sensation in her upper back between her 2 shoulders and radiates down her left arm and then she has sharp left-sided chest pain.  It resolves after 1 dose of Nitroglycerin.  However, sometimes she has multiple episodes a day.  She was reportedly diagnosed with bronchitis on 525/24  and states symptoms did get worse after this but she is clear that she was having the symptoms before that.  These episodes will sometimes wake her up from sleep.  They are sometimes worse with her coughing episodes.  She states woke up from sleep yesterday with this pain but then states she woke up coughing.  It is sometimes difficult to follow her history.  However, she took a dose of Nitroglycerin and it  went away.  She then had a another episode of chest pain later in the day but she had run out of her nitroglycerin.  Therefore, she called EMS who gave her another dose of sublingual Nitroglycerin.  She has some chronic dyspnea with exertion when she was out walking up an incline and she states it has been a little worse lately.  However, she denies any dyspnea when walking on a flat surface.  She states she does seem to do better when she is sitting or sleeping on an incline but unclear whether this is due to true orthopnea or whether her cough from bronchitis is just worse when she lays down.  She also reports waking up more feeling very short of breath recently.  Over, as suspect this is again more due to her bronchitis that is true PND from heart failure.  She denies any edema or weight gain.  She denies any palpitations.  She states she does frequently feel lightheaded and dizzy but no syncope.  Other than her bronchitis, she denies any recent fevers or illnesses.  She has some chronic diarrhea but this is not new for her.  No other GI symptoms.  No abnormal bleeding. She continues to smoke 1/2 pack per day.  Upon arrival to the ED, she was hypotensive with systolic BP in the 70s. EKG showed normal sinus rhythm with known LBBB. High-sensitivity troponin 10 >> 14 >> 41. Chest x-ray showed no acute findings. CTA negative for PE or aortic dissection. WBC 12.9, Hgb 13.2, Plts 316. Na 137, K 3.0, Glucose 109, BUN 11, Cr 0.88. Albumin 3.0. LFTs normal. She had persistent severe chest pain with associated diaphoresis in the ED and was requesting additional nitroglycerin.  However, ED provider was understandably hesitant to do this given her hypotension.  She was given IV fentanyl but had no significant improvement with this. She was also given IV Ativan.,  Then she became hypoxic with O2 sats of 89% was placed on 2 L of O2 via nasal cannula. She was also persistently hypotensive in the ED with systolic BP as low as  the 60s at some point. There is mention of a Nitroglycerin drip being started at 38mcg/min but I don't see this listed in the [facility] so unclear. The hypotension was felt to be due to multiple doses of Nitroglycerin. She has been taken her sublingual Nitroglycerin frequently over the last couple of weeks (sometimes 3 per day) and already has soft BP to begin with.  Past Medical History:  Diagnosis Date   Alcohol use    CAD (coronary artery disease)    Hyperlipidemia, mixed    Hypothyroidism    Ischemic cardiomyopathy    Tobacco abuse     Past Surgical History:  Procedure Laterality Date   CESAREAN SECTION     CORONARY STENT INTERVENTION N/A 03/07/2019   Procedure: CORONARY STENT INTERVENTION;  Surgeon: Tonny Bollman, MD;  Location: [facility] INVASIVE CV LAB;  Service: Cardiovascular;  Laterality: N/A;   CORONARY/GRAFT ACUTE MI REVASCULARIZATION N/A 08/02/2018   Procedure: Coronary/Graft  Acute MI Revascularization;  Surgeon: Marykay Lex, MD;  Location: [facility] INVASIVE CV LAB;  Service: Cardiovascular;  Laterality: N/A;   CORONARY/GRAFT ACUTE MI REVASCULARIZATION N/A 09/18/2022   Procedure: Coronary/Graft Acute MI Revascularization;  Surgeon: Runell Gess, MD;  Location: [facility] INVASIVE CV LAB;  Service: Cardiovascular;  Laterality: N/A;   LEFT HEART CATH AND CORONARY ANGIOGRAPHY N/A 08/02/2018   Procedure: LEFT HEART CATH AND CORONARY ANGIOGRAPHY;  Surgeon: Marykay Lex, MD;  Location: [facility] INVASIVE CV LAB;  Service: Cardiovascular;  Laterality: N/A;   LEFT HEART CATH AND CORONARY ANGIOGRAPHY N/A 03/07/2019   Procedure: LEFT HEART CATH AND CORONARY ANGIOGRAPHY;  Surgeon: Dolores Patty, MD;  Location: [facility] INVASIVE CV LAB;  Service: Cardiovascular;  Laterality: N/A;   LEFT HEART CATH AND CORONARY ANGIOGRAPHY N/A 09/18/2022   Procedure: LEFT HEART CATH AND CORONARY ANGIOGRAPHY;  Surgeon: Runell Gess, MD;  Location: [facility] INVASIVE CV LAB;  Service: Cardiovascular;  Laterality: N/A;     Home  Medications:  Prior to Admission medications   Medication Sig Start Date End Date Taking? Authorizing Provider  aspirin 81 MG chewable tablet Chew 1 tablet (81 mg total) by mouth daily. 10/06/22  Yes Milford, Anderson Malta, FNP  benzonatate (TESSALON) 100 MG capsule Take 100 mg by mouth every 8 (eight) hours.   Yes [provider]  carvedilol (COREG) 3.125 MG tablet Take 1 tablet (3.125 mg total) by mouth 2 (two) times daily. 01/08/23  Yes Laurey Morale, MD  empagliflozin (JARDIANCE) 10 MG TABS tablet Take 1 tablet (10 mg total) by mouth daily before breakfast. 10/06/22  Yes Milford, Anderson Malta, FNP  furosemide (LASIX) 20 MG tablet Take 1 tablet (20 mg total) by mouth daily as needed for fluid. 11/07/22  Yes Laurey Morale, MD  levothyroxine (SYNTHROID) 137 MCG tablet Take 137 mcg by mouth every morning.   Yes [provider]  losartan (COZAAR) 25 MG tablet Take 1 tablet (25 mg total) by mouth daily. 01/08/23  Yes Laurey Morale, MD  Multiple Vitamin (MULTIVITAMIN WITH MINERALS) TABS tablet Take 1 tablet by mouth daily. 09/25/15  Yes Hongalgi, Maximino Greenland, MD  nitrofurantoin, macrocrystal-monohydrate, (MACROBID) 100 MG capsule Take 100 mg by mouth 2 (two) times daily. 7 days supply 04/08/23  Yes [provider]  nitroGLYCERIN (NITROSTAT) 0.4 MG SL tablet Place 1 tablet (0.4 mg total) under the tongue every 5 (five) minutes x 3 doses as needed for chest pain. 11/07/22  Yes Laurey Morale, MD  Omega-3 Fatty Acids (FISH OIL) 1200 MG CAPS Take 1,200 mg by mouth daily.   Yes [provider]  potassium chloride SA (KLOR-CON M) 20 MEQ tablet Take 1 tablet (20 mEq total) by mouth daily. Patient taking differently: Take 20 mEq by mouth 2 (two) times daily. For 5 days 01/09/23  Yes Laurey Morale, MD  rosuvastatin (CRESTOR) 40 MG tablet Take 1 tablet (40 mg total) by mouth daily. 01/08/23  Yes Laurey Morale, MD  spironolactone (ALDACTONE) 25 MG tablet Take 1 tablet (25 mg total) by  mouth daily. 01/08/23  Yes Laurey Morale, MD  ticagrelor (BRILINTA) 90 MG TABS tablet Take 1 tablet (90 mg total) by mouth 2 (two) times daily. 11/08/22  Yes Milford, Anderson Malta, FNP  azithromycin (ZITHROMAX) 250 MG tablet TAKE 2 TABLETS BY MOUTH ON DAY 1, THEN TAKE 1 TABLET DAILY ON DAYS 2-5 Patient not taking: Reported on 04/12/2023 03/31/23   [provider]    Inpatient Medications:  Scheduled Meds:  arformoterol  15 mcg Nebulization BID   aspirin  81 mg Oral Daily   budesonide (PULMICORT) nebulizer solution  0.5 mg Nebulization BID   Chlorhexidine Gluconate Cloth  6 each Topical Daily   doxycycline  100 mg Oral Q12H   enoxaparin (LOVENOX) injection  40 mg Subcutaneous Q24H   [START ON 04/14/2023] predniSONE  40 mg Oral Q breakfast   revefenacin  175 mcg Nebulization Daily   rosuvastatin  40 mg Oral Daily   ticagrelor  90 mg Oral BID   Continuous Infusions:  norepinephrine (LEVOPHED) Adult infusion 4 mcg/min (04/13/23 1600)   PRN Meds: acetaminophen, docusate sodium, polyethylene glycol  Allergies:    Allergies  Allergen Reactions   Penicillins Other (See Comments)    Fever, vomiting Has patient had a PCN reaction causing immediate rash, facial/tongue/throat swelling, SOB or lightheadedness with hypotension: NO Has patient had a PCN reaction causing severe rash involving mucus membranes or skin necrosis: NO Has patient had a PCN reaction that required hospitalization: NO Has patient had a PCN reaction occurring within the last 10 years: NO If all of the above answers are "NO", then may proceed Childhood allergy    Social History:   Social History   Socioeconomic History   Marital status: Married    Spouse name: Not on file   Number of children: Not on file   Years of education: Not on file   Highest education level: Not on file  Occupational History   Not on file  Tobacco Use   Smoking status: Former    Packs/day: 1.00    Years: 22.00    Additional pack  years: 0.00    Total pack years: 22.00    Types: Cigarettes    Quit date: 08/09/2018    Years since quitting: 4.6   Smokeless tobacco: Never  Substance and Sexual Activity   Alcohol use: Yes    Alcohol/week: 8.0 standard drinks of alcohol    Types: 8 Glasses of wine per week   Drug use: No   Sexual activity: Not on file  Other Topics Concern   Not on file  Social History Narrative   Married   Gets regular exercise   Social Determinants of Health   Financial Resource Strain: Not on file  Food Insecurity: No Food Insecurity (04/13/2023)   Hunger Vital Sign    Worried About Running Out of Food in the Last Year: Never true    Ran Out of Food in the Last Year: Never true  Transportation Needs: No Transportation Needs (04/13/2023)   PRAPARE - Administrator, Civil Service (Medical): No    Lack of Transportation (Non-Medical): No  Physical Activity: Not on file  Stress: Not on file  Social Connections: Not on file  Intimate Partner Violence: Not At Risk (04/13/2023)   Humiliation, Afraid, Rape, and Kick questionnaire    Fear of Current or Ex-Partner: No    Emotionally Abused: No    Physically Abused: No    Sexually Abused: No    Family History:   Family History  Problem Relation Age of Onset   Coronary artery disease Mother    Coronary artery disease Maternal Grandmother    Hypertension Father    Diabetes Father      ROS:  Please see the history of present illness.  Review of Systems  Constitutional:  Positive for malaise/fatigue. Negative for chills and fever.  HENT:  Negative for congestion.   Respiratory:  Positive  for cough and shortness of breath.   Cardiovascular:  Positive for chest pain. Negative for palpitations and leg swelling.  Gastrointestinal:  Positive for diarrhea. Negative for blood in stool, melena, nausea and vomiting.  Genitourinary:  Negative for hematuria.  Musculoskeletal:  Negative for myalgias.  Neurological:  Positive for dizziness.  Negative for loss of consciousness.  Endo/Heme/Allergies:  Does not bruise/bleed easily.  Psychiatric/Behavioral:  Positive for substance abuse.       Physical Exam/Data:   Vitals:   04/13/23 1345 04/13/23 1400 04/13/23 1500 04/13/23 1600  BP: 90/60 (!) 87/59 92/61 112/61  Pulse:    64  Resp: 20 (!) 21 17 15   Temp:    98.5 F (36.9 C)  TempSrc:    Oral  SpO2:    97%  Weight:      Height:        Intake/Output Summary (Last 24 hours) at 04/13/2023 1634 Last data filed at 04/13/2023 1600 Gross per 24 hour  Intake 1480.26 ml  Output 2200 ml  Net -719.74 ml      04/13/2023    1:50 AM 04/13/2023   12:44 AM 04/12/2023    7:42 PM  Last 3 Weights  Weight (lbs) 127 lb 13.9 oz 127 lb 13.9 oz 123 lb 7.3 oz  Weight (kg) 58 kg 58 kg 56 kg     Body mass index is 23.39 kg/m.  General: 62 y.o. thin Caucasian female resting comfortably in no acute distress. HEENT: Normocephalic and atraumatic. Sclera clear.  Neck: Supple.  No JVD. Heart: RRR. Distinct S1 and S2. No murmurs, gallops, or rubs.  Lungs: No increased work of breathing. Clear to ausculation bilaterally. No wheezes, rhonchi, or rales.  Abdomen: Soft, non-distended, and non-tender to palpation.  Extremities: No lower extremity edema.    Skin: Warm and dry. Neuro: Alert and oriented x3. No focal deficits. Psych: Normal affect. Responds appropriately.  EKG:  The EKG was personally reviewed and demonstrates:  Normal sinus rhythm with LBBB. Telemetry:  Telemetry was personally reviewed and demonstrates:  Normal sinus rhythm with rates in the 60s to 80s.  Relevant CV Studies:  Left Cardiac Catheterization 09/18/2022:   Ost LM to Dist LM lesion is 50% stenosed.   Prox RCA to Dist RCA lesion is 90% stenosed.   RPDA lesion is 99% stenosed.   RPAV lesion is 95% stenosed.   A drug-eluting stent was successfully placed.   A drug-eluting stent was successfully placed.   Balloon angioplasty was performed.   Post intervention, there is a  0% residual stenosis.   Post intervention, there is a 0% residual stenosis.   Post intervention, there is a 0% residual stenosis.   There is severe left ventricular systolic dysfunction.   LV end diastolic pressure is moderately elevated.   The left ventricular ejection fraction is less than 25% by visual estimate.  Impressions: Successful stenting of the ostium of the PLA and PDA branches and PTCA of the entire previously stented RCA in the setting of inferior STEMI. The patient had apparently stopped her clopidogrel several days prior to admission. The pain began an hour prior to transfer. We talked about the importance of smoking cessation. Her EF is in the 20% range and she will need to be aggressively treated by the advanced heart failure service. Aggrastat will continue for 6 hours total. The sheath will be removed once ACT falls below 170 and pressure held. The patient left lab stable condition.   Diagnostic Dominance: Right  Intervention  Laboratory Data:  High Sensitivity Troponin:   Recent Labs  Lab 04/12/23 1954 04/12/23 2122 04/13/23 0714  TROPONINIHS 10 14 41*     Chemistry Recent Labs  Lab 04/12/23 1954 04/13/23 0714  NA 137 137  K 3.0* 3.7  CL 105 103  CO2 25 22  GLUCOSE 109* 100*  BUN 11 11  CREATININE 0.88 0.86  CALCIUM 8.8* 9.2  MG 1.9 1.8  GFRNONAA >60 >60  ANIONGAP 7 12    Recent Labs  Lab 04/12/23 1954  PROT 6.4*  ALBUMIN 3.0*  AST 24  ALT 28  ALKPHOS 71  BILITOT 0.5   Lipids No results for input(s): "CHOL", "TRIG", "HDL", "LABVLDL", "LDLCALC", "CHOLHDL" in the last 168 hours.  Hematology Recent Labs  Lab 04/12/23 1954 04/13/23 0714 04/13/23 1339  WBC 12.9* 17.0* 14.8*  RBC 4.62 4.83 4.61  HGB 13.2 13.8 12.8  HCT 40.2 41.6 39.5  MCV 87.0 86.1 85.7  MCH 28.6 28.6 27.8  MCHC 32.8 33.2 32.4  RDW 15.0 15.1 15.2  PLT 316 384 383   Thyroid No results for input(s): "TSH", "FREET4" in the last 168 hours.  BNPNo results for  input(s): "BNP", "PROBNP" in the last 168 hours.  DDimer No results for input(s): "DDIMER" in the last 168 hours.   Radiology/Studies:  ECHOCARDIOGRAM LIMITED  Result Date: 04/13/2023    ECHOCARDIOGRAM LIMITED REPORT   Patient Name:   Whitney May Date of Exam: 04/13/2023 Medical Rec #:  [id number]      Height:       62.0 in Accession #:    [id number]     Weight:       127.9 lb Date of Birth:  03/02/1961      BSA:          1.581 m Patient Age:    62 years       BP:           94/51 mmHg Patient Gender: F              HR:           65 bpm. Exam Location:  Inpatient Procedure: Limited Echo Indications:    Nstemi  History:        Patient has prior history of Echocardiogram examinations, most                 recent 01/08/2023. Cardiomyopathy; Risk Factors:Current Smoker.  Sonographer:    Darlys Gales Referring Phys: [id number] Vinnie Level SMITH IMPRESSIONS  1. Left ventricular ejection fraction, by estimation, is 20 to 25%. The left ventricle has severely decreased function. The left ventricle has no regional wall motion abnormalities. The left ventricular internal cavity size was mildly dilated.  2. Right ventricular systolic function is mildly reduced. The right ventricular size is normal.  3. The mitral valve was not well visualized. No evidence of mitral valve regurgitation. No evidence of mitral stenosis.  4. The aortic valve was not well visualized. Aortic valve regurgitation is not visualized. No aortic stenosis is present.  5. Limited echo. Color Doppler not performed. FINDINGS  Left Ventricle: Left ventricular ejection fraction, by estimation, is 20 to 25%. The left ventricle has severely decreased function. The left ventricle has no regional wall motion abnormalities. The left ventricular internal cavity size was mildly dilated. There is no left ventricular hypertrophy. Right Ventricle: The right ventricular size is normal. No increase in right ventricular wall thickness. Right ventricular systolic function is  mildly reduced. Left Atrium:  Left atrial size was normal in size. Right Atrium: Right atrial size was normal in size. Pericardium: There is no evidence of pericardial effusion. Mitral Valve: The mitral valve was not well visualized. No evidence of mitral valve stenosis. Tricuspid Valve: The tricuspid valve is not well visualized. Tricuspid valve regurgitation is not demonstrated. No evidence of tricuspid stenosis. Aortic Valve: The aortic valve was not well visualized. Aortic valve regurgitation is not visualized. No aortic stenosis is present. Pulmonic Valve: The pulmonic valve was not assessed. Pulmonic valve regurgitation is not visualized. No evidence of pulmonic stenosis. Aorta: Aortic root could not be assessed. Venous: The inferior vena cava was not well visualized. IAS/Shunts: No atrial level shunt detected by color flow Doppler. Additional Comments: A device lead is visualized.  LEFT VENTRICLE PLAX 2D LVIDd:         5.70 cm LVIDs:         5.20 cm LV PW:         1.10 cm LV IVS:        0.70 cm LVOT diam:     1.80 cm LVOT Area:     2.54 cm  IVC IVC diam: 1.60 cm LEFT ATRIUM             Index        RIGHT ATRIUM          Index LA Vol (A2C):   47.8 ml 30.24 ml/m  RA Area:     9.98 cm LA Vol (A4C):   25.2 ml 15.94 ml/m  RA Volume:   20.50 ml 12.97 ml/m LA Biplane Vol: 36.0 ml 22.77 ml/m   AORTA Ao Root diam: 3.20 cm  SHUNTS Systemic Diam: 1.80 cm Arvilla Meres MD Electronically signed by Arvilla Meres MD Signature Date/Time: 04/13/2023/4:12:06 PM    Final    VAS [place] LOWER EXTREMITY VENOUS (DVT)  Result Date: 04/13/2023  Lower Venous DVT Study Patient Name:  Whitney May  Date of Exam:   04/13/2023 Medical Rec #: [id number]       Accession #:    [id number] Date of Birth: 1961/08/20       Patient Gender: F Patient Age:   28 years Exam Location:  [facility] Procedure:      VAS [place] LOWER EXTREMITY VENOUS (DVT) Referring Phys: Levon Hedger  --------------------------------------------------------------------------------  Indications: Swelling.  Comparison Study: No priors. Performing Technologist: Marilynne Halsted RDMS, RVT  Examination Guidelines: A complete evaluation includes B-mode imaging, spectral Doppler, color Doppler, and power Doppler as needed of all accessible portions of each vessel. Bilateral testing is considered an integral part of a complete examination. Limited examinations for reoccurring indications may be performed as noted. The reflux portion of the exam is performed with the patient in reverse Trendelenburg.  +---------+---------------+---------+-----------+----------+--------------+ RIGHT    CompressibilityPhasicitySpontaneityPropertiesThrombus Aging +---------+---------------+---------+-----------+----------+--------------+ CFV      Partial        Yes      Yes                                 +---------+---------------+---------+-----------+----------+--------------+ SFJ      Full                                                        +---------+---------------+---------+-----------+----------+--------------+  FV Prox  Full                                                        +---------+---------------+---------+-----------+----------+--------------+ FV Mid   Full                                                        +---------+---------------+---------+-----------+----------+--------------+ FV DistalFull                                                        +---------+---------------+---------+-----------+----------+--------------+ PFV      Full                                                        +---------+---------------+---------+-----------+----------+--------------+ POP      Full           Yes      Yes                                 +---------+---------------+---------+-----------+----------+--------------+ PTV      Full                                                         +---------+---------------+---------+-----------+----------+--------------+ PERO     Full                                                        +---------+---------------+---------+-----------+----------+--------------+   +---------+---------------+---------+-----------+----------+--------------+ LEFT     CompressibilityPhasicitySpontaneityPropertiesThrombus Aging +---------+---------------+---------+-----------+----------+--------------+ CFV      Full           Yes      Yes                                 +---------+---------------+---------+-----------+----------+--------------+ SFJ      Full                                                        +---------+---------------+---------+-----------+----------+--------------+ FV Prox  Full                                                        +---------+---------------+---------+-----------+----------+--------------+  FV Mid   Full                                                        +---------+---------------+---------+-----------+----------+--------------+ FV DistalFull                                                        +---------+---------------+---------+-----------+----------+--------------+ PFV      Full                                                        +---------+---------------+---------+-----------+----------+--------------+ POP      Full           Yes      Yes                                 +---------+---------------+---------+-----------+----------+--------------+ PTV      Full                                                        +---------+---------------+---------+-----------+----------+--------------+ PERO     Full                                                        +---------+---------------+---------+-----------+----------+--------------+     Summary: BILATERAL: - No evidence of deep vein thrombosis seen in the lower extremities,  bilaterally. -No evidence of popliteal cyst, bilaterally.   *See table(s) above for measurements and observations.    Preliminary    CT Angio Chest/Abd/Pel for Dissection W and/or Wo Contrast  Result Date: 04/12/2023 CLINICAL DATA:  Chest pain for 1 hour EXAM: CT ANGIOGRAPHY CHEST, ABDOMEN AND PELVIS TECHNIQUE: Non-contrast CT of the chest was initially obtained. Multidetector CT imaging through the chest, abdomen and pelvis was performed using the standard protocol during bolus administration of intravenous contrast. Multiplanar reconstructed images and MIPs were obtained and reviewed to evaluate the vascular anatomy. RADIATION DOSE REDUCTION: This exam was performed according to the departmental dose-optimization program which includes automated exposure control, adjustment of the mA and/or kV according to patient size and/or use of iterative reconstruction technique. CONTRAST:  OMNIPAQUE IOHEXOL 350 MG/ML SOLN COMPARISON:  Plain film from earlier in the same day. FINDINGS: CTA CHEST FINDINGS Cardiovascular: Initial precontrast images show no hyperdense crescent to suggest acute aortic injury. The thoracic aorta demonstrates atherosclerotic calcifications. No aneurysmal dilatation or dissection is noted. No cardiac enlargement is seen. The pulmonary artery is within normal limits although not timed for embolus evaluation. No significant coronary calcifications are noted. Mediastinum/Nodes: Thoracic inlet is within normal limits. No hilar or mediastinal adenopathy is noted. The esophagus  is within normal limits. Lungs/Pleura: Mild emphysematous changes are noted. The lungs are otherwise well aerated bilaterally. No focal infiltrate or effusion is seen. No parenchymal nodules are noted. Musculoskeletal: No chest wall abnormality. No acute or significant osseous findings. Review of the MIP images confirms the above findings. CTA ABDOMEN AND PELVIS FINDINGS VASCULAR Aorta: Atherosclerotic calcifications are  noted without aneurysmal dilatation or dissection. Celiac: Patent without evidence of aneurysm, dissection, vasculitis or significant stenosis. SMA: Patent without evidence of aneurysm, dissection, vasculitis or significant stenosis. Renals: Both renal arteries are patent without evidence of aneurysm, dissection, vasculitis, fibromuscular dysplasia or significant stenosis. IMA: Patent without evidence of aneurysm, dissection, vasculitis or significant stenosis. Inflow: Iliacs demonstrate atherosclerotic calcifications without aneurysmal dilatation or dissection. Veins: No specific venous abnormality is noted. Review of the MIP images confirms the above findings. NON-VASCULAR Hepatobiliary: No focal liver abnormality is seen. No gallstones, gallbladder wall thickening, or biliary dilatation. Pancreas: Unremarkable. No pancreatic ductal dilatation or surrounding inflammatory changes. Spleen: Normal in size without focal abnormality. Adrenals/Urinary Tract: Adrenal glands are within normal limits. Kidneys demonstrate a normal enhancement pattern bilaterally. No renal calculi or obstructive changes are seen. The bladder is within normal limits. Stomach/Bowel: The appendix is not discretely visualized. No inflammatory changes to suggest appendicitis are noted. No obstructive or inflammatory changes of the colon are seen. Stomach and small bowel are within normal limits. Lymphatic: No significant lymphadenopathy is noted. Reproductive: Uterus and bilateral adnexa are unremarkable. Other: No abdominal wall hernia or abnormality. No abdominopelvic ascites. Musculoskeletal: No acute or significant osseous findings. Review of the MIP images confirms the above findings. IMPRESSION: CTA of the chest: No evidence of aortic abnormality. No pulmonary emboli are seen. CTA of the abdomen and pelvis: No aortic abnormality is noted. No acute abnormality is seen. Aortic Atherosclerosis (ICD10-I70.0) and Emphysema (ICD10-J43.9).  Electronically Signed   By: Alcide Clever M.D.   On: 04/12/2023 21:07   DG Chest 2 View  Result Date: 04/12/2023 CLINICAL DATA:  Chest pain EXAM: CHEST - 2 VIEW COMPARISON:  04/07/2023 FINDINGS: Cardiac shadow is stable. The lungs are well aerated bilaterally. No focal infiltrate or effusion is seen. No bony abnormality is noted. IMPRESSION: No active cardiopulmonary disease. Electronically Signed   By: Alcide Clever M.D.   On: 04/12/2023 20:38     Assessment and Plan:   Chest Pain Concerning for Unstable Angina CAD Patient has a long history of CAD with multiple prior interventions to RCA due to recurrent in-stent restenosis. Most recently admitted in 09/2022 with STEMI. LHC showed  diffuse 90% in-stent restenosis of the entire previously placed RCA stent, 99% stenosis of RPDA and 95% stenosis of RPAV as well as 50% stenosis of ostial to distal left main. She underwent successful PCI with DES to the PLA and PDA branches and PTCA of the entire previously stented RCA. She now presents with intermittent chest pain that feels like prior cardiac symptoms for the last couple of weeks requiring her to take frequent doses of sublingual Nitro to the point that she ran out of her prescription. Pain improves with Nitro. EKG showed normal sinus rhythm with known LBBB. High-sensitivity troponin 10 >> 14 >> 41. Pain was unresponsive to IV Fentanyl in the ED but we were unable to give additional Nitroglycerin due to hypotension. - Currently chest pain free. - Continue to hold home beta-blocker in the setting from persistent hypotension (likely due to amount of Nitro that she has been taking) requiring pressors. Will not add any antianginals  right now for this reason as well. - Continue DAPT with Aspirin and Brilinta.  - Continue high-intensity statin. - Patient chest pain sounds concerning for unstable angina. Patient is still on Levophed. We will let patient's BP stabilize over the weekend and then plan for cardiac  catheterization on Monday.   Ischemic Cardiomyopathy Chronic HFrEF Last Echo in 01/2023 showed LVEF of 25-30% with global hypokinesis with septal-lateral dyssynchrony consistent with LBBB. Plan was referral to EP for CRT-D after she obtained insurance. She received 1 dose of IV Lasix in the ED but looks euvolemic and was hypotensive on arrival. - Euvolemic on exam. - No additional diuresis needed. - Continue to hold home GDMT (Losartan, Coreg, Spironolactone, and Jardiance) for now given persistent hypotension requiring pressors. - Continue to monitor volume status closely. - She states she now has Medicaid so we can refer her to EP as an outpatient for CRT-D.   Hypotension Patient has soft BP at baseline. However, presented with systolic BP in the 70s and had persistent hypotension in the ED requiring Levophed. WBC elevated but blood cultures pending and procalcitonin normal. Hypotension felt to be due to multiple doses of Nitroglycerin. Don't think this is cardiogenic shock. - She is still hypotensive.  - Continue to hold GDMT.  - Continue Levophed per PCCM.  Hyperlipidemia - Continue home Crestor 40mg  daily.  Otherwise, per primary team: - Bronchitis - COPD exacerbation - Leukocytosis - Hypothyroidism   Risk Assessment/Risk Scores:   TIMI Risk Score for Unstable Angina or Non-ST Elevation MI:   The patient's TIMI risk score is 5, which indicates a 26% risk of all cause mortality, new or recurrent myocardial infarction or need for urgent revascularization in the next 14 days.{   New York Heart Association (NYHA) Functional Class NYHA Class II   For questions or updates, please contact [facility] Please consult [email or web address] for contact info under    Signed, Smitty Knudsen  04/13/2023 4:34 PM  Patient seen and examined and agree with Marjie Skiff, PA-C as detailed above.   In brief, the patient is a 63 y.o. female with a history of CAD (NSTEMI in  07/2018 s/p PCI with DES to RCA, NSTEMI in 03/2019 due to in-stent restenosis of RCA with overlapping DES x2, and STEMI in 09/2022 s/p DES to ostial PLA/ PDA branches and PTCA of the entire previously stented RCA), ischemic cardiomyopathy/ chronic HFrEF of 25-30% with global hypokinesis, PAD with long occlusion of left SFA, hyperlipidemia, hypothyroidism, and tobacco abuse who presented to the ER for chest pain for which Cardiology was consulted.   Patient with extensive CAD history as detailed above. She is now presenting with atypical chest pain that is described as a sharp left sided pain that radiated down her left arm with a tingling sensation in her back and upper shoulders. She has been taking SL NTG at home frequently due to these episodes which resolves the pain. The pain is not exertional and mainly occurs at night. She states had another episode this morning but had run out of her SL NTG prompting her to come to the ER.  In the ER, the patient was hypotensive on arrival with SBP 70s. ECG showed NSR with LBBB. Trop 10>14>41. CTA negative for PE. She had persistent severe chest pain in the ER but BP was too soft to give nitro and she was given fentanyl/ativan, became hypoxic and was placed on nasal cannula with improvement. She ultimately required transfer to ICU for levophed.  TTE today with LVEF 20-25%, mild LV dilation, mild RV dysfunction.  Currently, the patient is comfortable and chest pain free. Remains on levophed for BP support. Overall, symptoms are atypical and very possibly driven by bronchitis/COPD leading to pleurisy, however, she states they are the same as those she experience with her prior stents. Given her history, similar anginal symptoms in the past, and improvement in pain with SL NTG, plan for cath on Monday to further evaluation to ensure no interval change.  GEN: No acute distress.   Neck: No JVD Cardiac: RRR, no murmurs, rubs, or gallops.  Respiratory: Rhonchorous with  scattered wheezing GI: Soft, nontender, non-distended  MS: No edema; No deformity. Neuro:  Nonfocal  Psych: Normal affect    Plan: -Plan for LHC on Monday given similar anginal equivalent in the past, history, and improvement with NTG -Continue ASA 81mg  daily, brilinta 90mg  BID -Continue crestor 40mg  daily -Continue levophed for BP support -Cannot tolerate GDMT due to hypotension; will add as able -Now has medicaid so can be referred for CRT-D as outpatient -Management of COPD per primary team  Laurance Flatten, MD  Laurance Flatten, MD

## 2023-04-14 ENCOUNTER — Telehealth: Payer: Self-pay | Admitting: Internal Medicine

## 2023-04-14 DIAGNOSIS — R079 Chest pain, unspecified: Secondary | ICD-10-CM | POA: Diagnosis not present

## 2023-04-14 DIAGNOSIS — R0789 Other chest pain: Secondary | ICD-10-CM | POA: Diagnosis not present

## 2023-04-14 LAB — CBC
HCT: 40.7 % (ref 36.0–46.0)
Hemoglobin: 13.1 g/dL (ref 12.0–15.0)
MCH: 28.1 pg (ref 26.0–34.0)
MCHC: 32.2 g/dL (ref 30.0–36.0)
MCV: 87.3 fL (ref 80.0–100.0)
Platelets: 324 10*3/uL (ref 150–400)
RBC: 4.66 MIL/uL (ref 3.87–5.11)
RDW: 15.3 % (ref 11.5–15.5)
WBC: 13.1 10*3/uL — ABNORMAL HIGH (ref 4.0–10.5)
nRBC: 0 % (ref 0.0–0.2)

## 2023-04-14 LAB — GLUCOSE, CAPILLARY: Glucose-Capillary: 219 mg/dL — ABNORMAL HIGH (ref 70–99)

## 2023-04-14 LAB — CULTURE, RESPIRATORY W GRAM STAIN

## 2023-04-14 LAB — BASIC METABOLIC PANEL
Anion gap: 12 (ref 5–15)
BUN: 13 mg/dL (ref 6–20)
CO2: 17 mmol/L — ABNORMAL LOW (ref 22–32)
Calcium: 8.9 mg/dL (ref 8.9–10.3)
Chloride: 105 mmol/L (ref 98–111)
Creatinine, Ser: 0.96 mg/dL (ref 0.44–1.00)
GFR, Estimated: >60 mL/min (ref >60)
Glucose, Bld: 114 mg/dL — ABNORMAL HIGH (ref 70–99)
Potassium: 4.5 mmol/L (ref 3.5–5.1)
Sodium: 134 mmol/L — ABNORMAL LOW (ref 135–145)

## 2023-04-14 MED ORDER — OXYCODONE-ACETAMINOPHEN 5-325 MG PO TABS
1.0000 | ORAL_TABLET | ORAL | Status: DC | PRN
  Administered 2023-04-14 – 2023-04-17 (×5): 1 via ORAL
  Filled 2023-04-14 (×5): qty 1

## 2023-04-14 MED ORDER — SODIUM CHLORIDE 0.9 % IV SOLN
2.0000 g | INTRAVENOUS | Status: DC
  Administered 2023-04-14 – 2023-04-17 (×4): 2 g via INTRAVENOUS
  Filled 2023-04-14 (×4): qty 20

## 2023-04-14 MED ORDER — GUAIFENESIN-DM 100-10 MG/5ML PO SYRP
5.0000 mL | ORAL_SOLUTION | ORAL | Status: DC | PRN
  Administered 2023-04-14: 5 mL via ORAL
  Filled 2023-04-14: qty 5

## 2023-04-14 NOTE — Progress Notes (Signed)
Pt to be transferred to [facility] 4E. Report given to [place], Charity fundraiser.

## 2023-04-14 NOTE — Progress Notes (Signed)
eLink Physician-Brief Progress Note Patient Name: Whitney May DOB: Oct 14, 1961 MRN: [id number]   Date of Service  04/14/2023  HPI/Events of Note  Has a cough  eICU Interventions  Add Robitussin     Intervention Category Minor Interventions: Routine modifications to care plan (e.g. PRN medications for pain, fever)  Kiyana Vazguez 04/14/2023, 12:25 AM

## 2023-04-14 NOTE — Progress Notes (Signed)
Rounding Note    Patient Name: Whitney May Date of Encounter: 04/14/2023  [facility] Cardiologist: Jearld Pies  Subjective   Ongoing left sided chest pain overnight, lasting few hours at a time.   Inpatient Medications    Scheduled Meds:  arformoterol  15 mcg Nebulization BID   aspirin  81 mg Oral Daily   budesonide (PULMICORT) nebulizer solution  0.5 mg Nebulization BID   Chlorhexidine Gluconate Cloth  6 each Topical Daily   doxycycline  100 mg Oral Q12H   enoxaparin (LOVENOX) injection  40 mg Subcutaneous Q24H   predniSONE  40 mg Oral Q breakfast   revefenacin  175 mcg Nebulization Daily   rosuvastatin  40 mg Oral Daily   ticagrelor  90 mg Oral BID   Continuous Infusions:  norepinephrine (LEVOPHED) Adult infusion Stopped (04/14/23 0201)   PRN Meds: acetaminophen, docusate sodium, guaiFENesin-dextromethorphan, polyethylene glycol   Vital Signs    Vitals:   04/14/23 0615 04/14/23 0630 04/14/23 0645 04/14/23 0700  BP: 96/72 109/73 (!) 84/66 (!) 85/71  Pulse: 90 86 74 98  Resp:      Temp:      TempSrc:      SpO2: 96% 95% 97% 98%  Weight:      Height:        Intake/Output Summary (Last 24 hours) at 04/14/2023 0728 Last data filed at 04/14/2023 0400 Gross per 24 hour  Intake 1822.68 ml  Output 2100 ml  Net -277.32 ml      04/14/2023    3:06 AM 04/13/2023    1:50 AM 04/13/2023   12:44 AM  Last 3 Weights  Weight (lbs) 126 lb 8.7 oz 127 lb 13.9 oz 127 lb 13.9 oz  Weight (kg) 57.4 kg 58 kg 58 kg      Telemetry    NSR - Personally Reviewed  ECG    N/a - Personally Reviewed  Physical Exam   GEN: No acute distress.   Neck: No JVD Cardiac: RRR, no murmurs, rubs, or gallops.  Respiratory: Clear to auscultation bilaterally. GI: Soft, nontender, non-distended  MS: No edema; No deformity. Neuro:  Nonfocal  Psych: Normal affect   Labs    High Sensitivity Troponin:   Recent Labs  Lab 04/12/23 1954 04/12/23 2122 04/13/23 0714  TROPONINIHS 10  14 41*     Chemistry Recent Labs  Lab 04/12/23 1954 04/13/23 0714 04/14/23 0230  NA 137 137 134*  K 3.0* 3.7 4.5  CL 105 103 105  CO2 25 22 17*  GLUCOSE 109* 100* 114*  BUN 11 11 13   CREATININE 0.88 0.86 0.96  CALCIUM 8.8* 9.2 8.9  MG 1.9 1.8  --   PROT 6.4*  --   --   ALBUMIN 3.0*  --   --   AST 24  --   --   ALT 28  --   --   ALKPHOS 71  --   --   BILITOT 0.5  --   --   GFRNONAA >60 >60 >60  ANIONGAP 7 12 12     Lipids No results for input(s): "CHOL", "TRIG", "HDL", "LABVLDL", "LDLCALC", "CHOLHDL" in the last 168 hours.  Hematology Recent Labs  Lab 04/13/23 0714 04/13/23 1339 04/14/23 0230  WBC 17.0* 14.8* 13.1*  RBC 4.83 4.61 4.66  HGB 13.8 12.8 13.1  HCT 41.6 39.5 40.7  MCV 86.1 85.7 87.3  MCH 28.6 27.8 28.1  MCHC 33.2 32.4 32.2  RDW 15.1 15.2 15.3  PLT 384 383  324   Thyroid No results for input(s): "TSH", "FREET4" in the last 168 hours.  BNPNo results for input(s): "BNP", "PROBNP" in the last 168 hours.  DDimer No results for input(s): "DDIMER" in the last 168 hours.   Radiology    ECHOCARDIOGRAM LIMITED  Result Date: 04/13/2023    ECHOCARDIOGRAM LIMITED REPORT   Patient Name:   Whitney May Date of Exam: 04/13/2023 Medical Rec #:  [id number]      Height:       62.0 in Accession #:    [id number]     Weight:       127.9 lb Date of Birth:  Apr 21, 1961      BSA:          1.581 m Patient Age:    62 years       BP:           94/51 mmHg Patient Gender: F              HR:           65 bpm. Exam Location:  Inpatient Procedure: Limited Echo Indications:    Nstemi  History:        Patient has prior history of Echocardiogram examinations, most                 recent 01/08/2023. Cardiomyopathy; Risk Factors:Current Smoker.  Sonographer:    Darlys Gales Referring Phys: [id number] Vinnie Level SMITH IMPRESSIONS  1. Left ventricular ejection fraction, by estimation, is 20 to 25%. The left ventricle has severely decreased function. The left ventricle has no regional wall motion  abnormalities. The left ventricular internal cavity size was mildly dilated.  2. Right ventricular systolic function is mildly reduced. The right ventricular size is normal.  3. The mitral valve was not well visualized. No evidence of mitral valve regurgitation. No evidence of mitral stenosis.  4. The aortic valve was not well visualized. Aortic valve regurgitation is not visualized. No aortic stenosis is present.  5. Limited echo. Color Doppler not performed. FINDINGS  Left Ventricle: Left ventricular ejection fraction, by estimation, is 20 to 25%. The left ventricle has severely decreased function. The left ventricle has no regional wall motion abnormalities. The left ventricular internal cavity size was mildly dilated. There is no left ventricular hypertrophy. Right Ventricle: The right ventricular size is normal. No increase in right ventricular wall thickness. Right ventricular systolic function is mildly reduced. Left Atrium: Left atrial size was normal in size. Right Atrium: Right atrial size was normal in size. Pericardium: There is no evidence of pericardial effusion. Mitral Valve: The mitral valve was not well visualized. No evidence of mitral valve stenosis. Tricuspid Valve: The tricuspid valve is not well visualized. Tricuspid valve regurgitation is not demonstrated. No evidence of tricuspid stenosis. Aortic Valve: The aortic valve was not well visualized. Aortic valve regurgitation is not visualized. No aortic stenosis is present. Pulmonic Valve: The pulmonic valve was not assessed. Pulmonic valve regurgitation is not visualized. No evidence of pulmonic stenosis. Aorta: Aortic root could not be assessed. Venous: The inferior vena cava was not well visualized. IAS/Shunts: No atrial level shunt detected by color flow Doppler. Additional Comments: A device lead is visualized.  LEFT VENTRICLE PLAX 2D LVIDd:         5.70 cm LVIDs:         5.20 cm LV PW:         1.10 cm LV IVS:        0.70  cm LVOT diam:      1.80 cm LVOT Area:     2.54 cm  IVC IVC diam: 1.60 cm LEFT ATRIUM             Index        RIGHT ATRIUM          Index LA Vol (A2C):   47.8 ml 30.24 ml/m  RA Area:     9.98 cm LA Vol (A4C):   25.2 ml 15.94 ml/m  RA Volume:   20.50 ml 12.97 ml/m LA Biplane Vol: 36.0 ml 22.77 ml/m   AORTA Ao Root diam: 3.20 cm  SHUNTS Systemic Diam: 1.80 cm Arvilla Meres MD Electronically signed by Arvilla Meres MD Signature Date/Time: 04/13/2023/4:12:06 PM    Final    VAS [place] LOWER EXTREMITY VENOUS (DVT)  Result Date: 04/13/2023  Lower Venous DVT Study Patient Name:  Whitney May  Date of Exam:   04/13/2023 Medical Rec #: [id number]       Accession #:    [id number] Date of Birth: August 07, 1961       Patient Gender: F Patient Age:   59 years Exam Location:  [facility] Procedure:      VAS [place] LOWER EXTREMITY VENOUS (DVT) Referring Phys: Levon Hedger --------------------------------------------------------------------------------  Indications: Swelling.  Comparison Study: No priors. Performing Technologist: Marilynne Halsted RDMS, RVT  Examination Guidelines: A complete evaluation includes B-mode imaging, spectral Doppler, color Doppler, and power Doppler as needed of all accessible portions of each vessel. Bilateral testing is considered an integral part of a complete examination. Limited examinations for reoccurring indications may be performed as noted. The reflux portion of the exam is performed with the patient in reverse Trendelenburg.  +---------+---------------+---------+-----------+----------+--------------+ RIGHT    CompressibilityPhasicitySpontaneityPropertiesThrombus Aging +---------+---------------+---------+-----------+----------+--------------+ CFV      Partial        Yes      Yes                                 +---------+---------------+---------+-----------+----------+--------------+ SFJ      Full                                                         +---------+---------------+---------+-----------+----------+--------------+ FV Prox  Full                                                        +---------+---------------+---------+-----------+----------+--------------+ FV Mid   Full                                                        +---------+---------------+---------+-----------+----------+--------------+ FV DistalFull                                                        +---------+---------------+---------+-----------+----------+--------------+  PFV      Full                                                        +---------+---------------+---------+-----------+----------+--------------+ POP      Full           Yes      Yes                                 +---------+---------------+---------+-----------+----------+--------------+ PTV      Full                                                        +---------+---------------+---------+-----------+----------+--------------+ PERO     Full                                                        +---------+---------------+---------+-----------+----------+--------------+   +---------+---------------+---------+-----------+----------+--------------+ LEFT     CompressibilityPhasicitySpontaneityPropertiesThrombus Aging +---------+---------------+---------+-----------+----------+--------------+ CFV      Full           Yes      Yes                                 +---------+---------------+---------+-----------+----------+--------------+ SFJ      Full                                                        +---------+---------------+---------+-----------+----------+--------------+ FV Prox  Full                                                        +---------+---------------+---------+-----------+----------+--------------+ FV Mid   Full                                                         +---------+---------------+---------+-----------+----------+--------------+ FV DistalFull                                                        +---------+---------------+---------+-----------+----------+--------------+ PFV      Full                                                        +---------+---------------+---------+-----------+----------+--------------+  POP      Full           Yes      Yes                                 +---------+---------------+---------+-----------+----------+--------------+ PTV      Full                                                        +---------+---------------+---------+-----------+----------+--------------+ PERO     Full                                                        +---------+---------------+---------+-----------+----------+--------------+     Summary: BILATERAL: - No evidence of deep vein thrombosis seen in the lower extremities, bilaterally. -No evidence of popliteal cyst, bilaterally.   *See table(s) above for measurements and observations.    Preliminary    CT Angio Chest/Abd/Pel for Dissection W and/or Wo Contrast  Result Date: 04/12/2023 CLINICAL DATA:  Chest pain for 1 hour EXAM: CT ANGIOGRAPHY CHEST, ABDOMEN AND PELVIS TECHNIQUE: Non-contrast CT of the chest was initially obtained. Multidetector CT imaging through the chest, abdomen and pelvis was performed using the standard protocol during bolus administration of intravenous contrast. Multiplanar reconstructed images and MIPs were obtained and reviewed to evaluate the vascular anatomy. RADIATION DOSE REDUCTION: This exam was performed according to the departmental dose-optimization program which includes automated exposure control, adjustment of the mA and/or kV according to patient size and/or use of iterative reconstruction technique. CONTRAST:  OMNIPAQUE IOHEXOL 350 MG/ML SOLN COMPARISON:  Plain film from earlier in the same day. FINDINGS: CTA CHEST FINDINGS  Cardiovascular: Initial precontrast images show no hyperdense crescent to suggest acute aortic injury. The thoracic aorta demonstrates atherosclerotic calcifications. No aneurysmal dilatation or dissection is noted. No cardiac enlargement is seen. The pulmonary artery is within normal limits although not timed for embolus evaluation. No significant coronary calcifications are noted. Mediastinum/Nodes: Thoracic inlet is within normal limits. No hilar or mediastinal adenopathy is noted. The esophagus is within normal limits. Lungs/Pleura: Mild emphysematous changes are noted. The lungs are otherwise well aerated bilaterally. No focal infiltrate or effusion is seen. No parenchymal nodules are noted. Musculoskeletal: No chest wall abnormality. No acute or significant osseous findings. Review of the MIP images confirms the above findings. CTA ABDOMEN AND PELVIS FINDINGS VASCULAR Aorta: Atherosclerotic calcifications are noted without aneurysmal dilatation or dissection. Celiac: Patent without evidence of aneurysm, dissection, vasculitis or significant stenosis. SMA: Patent without evidence of aneurysm, dissection, vasculitis or significant stenosis. Renals: Both renal arteries are patent without evidence of aneurysm, dissection, vasculitis, fibromuscular dysplasia or significant stenosis. IMA: Patent without evidence of aneurysm, dissection, vasculitis or significant stenosis. Inflow: Iliacs demonstrate atherosclerotic calcifications without aneurysmal dilatation or dissection. Veins: No specific venous abnormality is noted. Review of the MIP images confirms the above findings. NON-VASCULAR Hepatobiliary: No focal liver abnormality is seen. No gallstones, gallbladder wall thickening, or biliary dilatation. Pancreas: Unremarkable. No pancreatic ductal dilatation or surrounding inflammatory changes. Spleen: Normal in size without focal abnormality. Adrenals/Urinary Tract: Adrenal glands are within normal limits.  Kidneys  demonstrate a normal enhancement pattern bilaterally. No renal calculi or obstructive changes are seen. The bladder is within normal limits. Stomach/Bowel: The appendix is not discretely visualized. No inflammatory changes to suggest appendicitis are noted. No obstructive or inflammatory changes of the colon are seen. Stomach and small bowel are within normal limits. Lymphatic: No significant lymphadenopathy is noted. Reproductive: Uterus and bilateral adnexa are unremarkable. Other: No abdominal wall hernia or abnormality. No abdominopelvic ascites. Musculoskeletal: No acute or significant osseous findings. Review of the MIP images confirms the above findings. IMPRESSION: CTA of the chest: No evidence of aortic abnormality. No pulmonary emboli are seen. CTA of the abdomen and pelvis: No aortic abnormality is noted. No acute abnormality is seen. Aortic Atherosclerosis (ICD10-I70.0) and Emphysema (ICD10-J43.9). Electronically Signed   By: Alcide Clever M.D.   On: 04/12/2023 21:07   DG Chest 2 View  Result Date: 04/12/2023 CLINICAL DATA:  Chest pain EXAM: CHEST - 2 VIEW COMPARISON:  04/07/2023 FINDINGS: Cardiac shadow is stable. The lungs are well aerated bilaterally. No focal infiltrate or effusion is seen. No bony abnormality is noted. IMPRESSION: No active cardiopulmonary disease. Electronically Signed   By: Alcide Clever M.D.   On: 04/12/2023 20:38    Cardiac Studies    Patient Profile     Whitney May is a 62 y.o. female with a history of CAD (NSTEMI in 07/2018 s/p PCI with DES to RCA, NSTEMI in 03/2019 due to in-stent restenosis of RCA with overlapping DES x2, and STEMI in 09/2022 s/p DES to ostial PLA/ PDA branches and PTCA of the entire previously stented RCA), ischemic cardiomyopathy/ chronic HFrEF of 25-30% with global hypokinesis, PAD with long occlusion of left SFA, hyperlipidemia, hypothyroidism, and tobacco abuse   who is being seen 04/13/2023 for the evaluation of chest pain at the request of Dr.  Katrinka Blazing.   Assessment & Plan    1.CAD/chest pain - NSTEMI in 07/2018, DES to RCA - NSTEMI 03/2019 due to ISR, received DES to RCA x2 - STEMI 09/2022 DES to PLA/PDA and PTCA of RCA stents  - presented with chest pain - trop 10-->14-->41.  EKG chronic LBBB - echo 04/2023 LVEF 20-25% (01/2023 ehco LVEF 25-30%) - atypical symptoms though similar to prior symptoms at times of prior stents. Plan is for cath Monday.  - medical therapy with ASA 81, crestor 40, brillinta 90mg  bid. Other meds limited by soft bp's, transiently on levophed on admission.     2.Chronic HFrEF  echo 04/2023 LVEF 20-25% (01/2023 ehco LVEF 25-30%) - CXR no acute process, BNP pending - medical therapy limited by low bp's, home regimen on hold.    3. Hypotension - soft bp's at baseline, presented with SBP 70s  - from notes thought to be due to mulitiple doses of NG taken at home - was on low dose levophed initially, able to be weaned off.   4.COPD  - per primary team - from pulm note suspect possible bronchitis/COPD with perhaps some pleuritic associated pain.    For questions or updates, please contact [facility] Please consult [email or web address] for contact info under        Signed, Dina Rich, MD  04/14/2023, 7:28 AM

## 2023-04-14 NOTE — Progress Notes (Addendum)
NAME:  Whitney May, MRN:  [id number], DOB:  [birth year], LOS: 2 ADMISSION DATE:  04/12/2023, CONSULTATION DATE:  04/13/2023 REFERRING MD:  Loetta Rough, MD , CHIEF COMPLAINT: Chest pain  History of Present Illness:  62 year old female with coronary artery disease s/p NSTEMI in 2020 and STEMI in 2023 on dual antiplatelet agents, hyperlipidemia, chronic biventricular HFrEF and active tobacco dependence who was brought into the emergency department at [facility] with complaint of chest pain which started earlier, she took her meds at that time and took nitroglycerin x 3, started feeling dizzy and with continued chest pain she got scared and came to the emergency department. In the ED she was noted to be hypotensive with systolic blood pressure in the 80s, was started on Levophed.  EKG was done which was negative for acute findings showing LBBB which is chronic, serum troponin remained negative.  CTA chest dissection protocol was done which was negative as patient continued to complain of chest pain.  Cardiology was consulted, they recommended no intervention. As patient continued to require vasopressor support with Levophed, PCCM was consulted for help evaluation and transfer to [facility], ICU.  On further history patient has MMRC 1 dyspnea, increased cough with green sputum, wheezing.  Ongoing smoking but she has cut back.  Coughing does awaken her from sleep and seems that this preceded her chest pains above.  Pertinent  Medical History   Past Medical History:  Diagnosis Date   Alcohol use    CAD (coronary artery disease)    Hyperlipidemia, mixed    Hypothyroidism    Ischemic cardiomyopathy    Tobacco abuse      Significant Hospital Events: Including procedures, antibiotic start and stop dates in addition to other pertinent events     Interim History / Subjective:  Off pressors. Still has cough with green sputum.  Objective   Blood pressure (!) 85/71, pulse 98, temperature 98.2 F  (36.8 C), temperature source Oral, resp. rate 18, height 5\' 2"  (1.575 m), weight 57.4 kg, SpO2 98 %.        Intake/Output Summary (Last 24 hours) at 04/14/2023 0750 Last data filed at 04/14/2023 0400 Gross per 24 hour  Intake 1822.68 ml  Output 2100 ml  Net -277.32 ml    Filed Weights   04/13/23 0044 04/13/23 0150 04/14/23 0306  Weight: 58 kg 58 kg 57.4 kg    Examination: No distress Less congestion on auscultation Abd soft Moves to command Aox3, in good spirits  MRSA PCR neg BMP ok except some metabolic acidemia unclear cause WBC slightly improved Eos 400 Differential pretty normal UA neg  Resolved Hospital Problem list     Assessment & Plan:  Chest pain, likely muscular skeletal in origin  Medication related hypotension Coronary artery disease status post stent Chronic biventricular HFrEF Tobacco dependence Hypokalemia Leukocytosis: going to call this infectious bronchitis I guess  Hx and symptoms more suggestive of bronchitis/AECOPD leading to pleurisy but has hx of atypical angina so going to get [facility] Monday  - Bronchodilators, steroids, doxycycline; add ceftriaxone, f/u sputum culture - LE duplex (neg), limited echo (stable reduced EF) - Smoking cessation counseling - Would send home on anoro or stiolto - Needs PFTs and OP pulm f/u: will send message to desk  Best Practice (right click and "Reselect all SmartList Selections" daily)   Diet/type: Regular consistency (see orders) DVT prophylaxis: LMWH GI prophylaxis: N/A Lines: N/A Foley:  N/A Code Status:  full code Last date of multidisciplinary  goals of care discussion [6/8 updated at bedside]  Stable for transfer to tele: remaining issues (A) LHC (B) GDMT (C) bronchitis improvement  Myrla Halsted MD PCCM

## 2023-04-14 NOTE — Telephone Encounter (Signed)
3-4 week f/u with any doc to establish care for COPD

## 2023-04-15 DIAGNOSIS — R0789 Other chest pain: Secondary | ICD-10-CM | POA: Diagnosis not present

## 2023-04-15 DIAGNOSIS — I25119 Atherosclerotic heart disease of native coronary artery with unspecified angina pectoris: Secondary | ICD-10-CM | POA: Diagnosis present

## 2023-04-15 LAB — CBC
HCT: 38.5 % (ref 36.0–46.0)
Hemoglobin: 12.5 g/dL (ref 12.0–15.0)
MCH: 28.5 pg (ref 26.0–34.0)
MCHC: 32.5 g/dL (ref 30.0–36.0)
MCV: 87.9 fL (ref 80.0–100.0)
Platelets: 336 10*3/uL (ref 150–400)
RBC: 4.38 MIL/uL (ref 3.87–5.11)
RDW: 15.3 % (ref 11.5–15.5)
WBC: 18.9 10*3/uL — ABNORMAL HIGH (ref 4.0–10.5)
nRBC: 0 % (ref 0.0–0.2)

## 2023-04-15 LAB — BASIC METABOLIC PANEL
Anion gap: 13 (ref 5–15)
BUN: 16 mg/dL (ref 6–20)
CO2: 21 mmol/L — ABNORMAL LOW (ref 22–32)
Calcium: 9 mg/dL (ref 8.9–10.3)
Chloride: 102 mmol/L (ref 98–111)
Creatinine, Ser: 0.96 mg/dL (ref 0.44–1.00)
GFR, Estimated: >60 mL/min (ref >60)
Glucose, Bld: 109 mg/dL — ABNORMAL HIGH (ref 70–99)
Potassium: 4.4 mmol/L (ref 3.5–5.1)
Sodium: 136 mmol/L (ref 135–145)

## 2023-04-15 LAB — TROPONIN I (HIGH SENSITIVITY)
Troponin I (High Sensitivity): 100 ng/L (ref <18)
Troponin I (High Sensitivity): 111 ng/L (ref <18)

## 2023-04-15 LAB — CULTURE, BLOOD (ROUTINE X 2)

## 2023-04-15 LAB — BRAIN NATRIURETIC PEPTIDE: B Natriuretic Peptide: 469.7 pg/mL — ABNORMAL HIGH (ref 0.0–100.0)

## 2023-04-15 LAB — MAGNESIUM: Magnesium: 1.9 mg/dL (ref 1.7–2.4)

## 2023-04-15 MED ORDER — LEVOTHYROXINE SODIUM 25 MCG PO TABS
137.0000 ug | ORAL_TABLET | Freq: Every day | ORAL | Status: DC
  Administered 2023-04-16: 137 ug via ORAL
  Filled 2023-04-15 (×2): qty 1

## 2023-04-15 MED ORDER — SODIUM CHLORIDE 0.9% FLUSH: 3.0000 mL | INTRAVENOUS | Status: DC | PRN

## 2023-04-15 MED ORDER — SODIUM CHLORIDE 0.9 % IV SOLN: INTRAVENOUS | Status: DC

## 2023-04-15 MED ORDER — ASPIRIN 81 MG PO CHEW
81.0000 mg | CHEWABLE_TABLET | ORAL | Status: AC
  Administered 2023-04-16: 81 mg via ORAL
  Filled 2023-04-15: qty 1

## 2023-04-15 MED ORDER — NITROGLYCERIN 0.4 MG SL SUBL
SUBLINGUAL_TABLET | SUBLINGUAL | Status: AC
  Filled 2023-04-15: qty 1

## 2023-04-15 MED ORDER — MORPHINE SULFATE (PF) 2 MG/ML IV SOLN
2.0000 mg | INTRAVENOUS | Status: DC | PRN
  Administered 2023-04-16 – 2023-04-17 (×4): 2 mg via INTRAVENOUS
  Filled 2023-04-15 (×4): qty 1

## 2023-04-15 MED ORDER — LEVOTHYROXINE SODIUM 25 MCG PO TABS
137.0000 ug | ORAL_TABLET | Freq: Once | ORAL | Status: AC
  Administered 2023-04-15: 137 ug via ORAL
  Filled 2023-04-15: qty 1

## 2023-04-15 MED ORDER — NITROGLYCERIN 0.4 MG SL SUBL
0.4000 mg | SUBLINGUAL_TABLET | SUBLINGUAL | Status: DC | PRN
  Administered 2023-04-15 – 2023-04-16 (×3): 0.4 mg via SUBLINGUAL
  Filled 2023-04-15 (×2): qty 1

## 2023-04-15 MED ORDER — SODIUM CHLORIDE 0.9 % IV SOLN: 250.0000 mL | INTRAVENOUS | Status: DC | PRN

## 2023-04-15 MED ORDER — SODIUM CHLORIDE 0.9% FLUSH
3.0000 mL | Freq: Two times a day (BID) | INTRAVENOUS | Status: DC
  Administered 2023-04-15 – 2023-04-17 (×4): 3 mL via INTRAVENOUS

## 2023-04-15 NOTE — Progress Notes (Signed)
PROGRESS NOTE    Whitney May  ZOX:[id number] DOB: May 02, 1961 DOA: 04/12/2023 PCP: Shelby Dubin, FNP     Brief Narrative:  62 year old female with CAD s/p NSTEMI in 2020 and STEMI in 2023 on DAPT, hyperlipidemia, HFrEF, and tobacco abuse admitted to on 6/6 6 for continued chest pain despite taking nitroglycerin 3 times.  She had been dizzy and noted to be hypotensive, started on Levophed.  She was transferred to: ICU due to pressor requirements.  Blood pressure improved and she was transferred to the floor in 04/14/2023.   New events last 24 hours / Subjective: Had an episode of chest pain earlier in the morning.  She took a nitroglycerin and it resolved.  No dizziness recently.  No current chest pain or shortness of breath.  She has a left heart catheterization scheduled with the cardiology team tomorrow.  Assessment & Plan:   Principal Problem:   Hypotension  Currently resolved  Active Problems:   Coronary artery disease involving native coronary artery with angina pectoris [facility])  Appreciate cardiology, LHC tomorrow morning  Brilinta 90 mg daily, aspirin 81 mg daily  Crestor 40 mg daily  N.p.o. at midnight  Nitroglycerin/morphine as needed    CAP  Doxycycline 100 mg twice daily  Rocephin 2 g daily  Nebulizers as needed  Robitussin DM as needed  Prednisone 40 mg daily    Hypothyroidism  Levothyroxine 137 mcg daily    Hyperlipidemia, mixed  Crestor    DVT prophylaxis: Lovenox Code Status: Full Family Communication: None at bedside Coming From: Home  Disposition Plan: Home Barriers to Discharge: Waiting on procedure  Consultants:  Cardiology PCCM  Antimicrobials:  Anti-infectives (From admission, onward)    Start     Dose/Rate Route Frequency Ordered Stop   04/14/23 0900  cefTRIAXone (ROCEPHIN) 2 g in sodium chloride 0.9 % 100 mL IVPB        2 g 200 mL/hr over 30 Minutes Intravenous Every 24 hours 04/14/23 0754 04/20/23 0859   04/13/23 1000  doxycycline  (VIBRA-TABS) tablet 100 mg        100 mg Oral Every 12 hours 04/13/23 0823 04/20/23 0959        Objective: Vitals:   04/15/23 0600 04/15/23 0736 04/15/23 0803 04/15/23 1100  BP: 110/78 100/69  (!) 90/50  Pulse: 85 83    Resp: 20 19    Temp:  98.2 F (36.8 C)    TempSrc:  Oral    SpO2: 94% 96% 95%   Weight:      Height:        Intake/Output Summary (Last 24 hours) at 04/15/2023 1126 Last data filed at 04/14/2023 2012 Gross per 24 hour  Intake 720 ml  Output --  Net 720 ml   Filed Weights   04/13/23 0150 04/14/23 0306 04/15/23 0500  Weight: 58 kg 57.4 kg 57 kg    Examination:  General exam: Appears calm and comfortable  Respiratory system: Clear to auscultation. Respiratory effort normal. No respiratory distress. No conversational dyspnea.  Cardiovascular system: S1 & S2 heard, RRR. No murmurs. No pedal edema. Central nervous system: Alert and oriented. No focal neurological deficits. Speech clear.  Extremities: Symmetric in appearance  Skin: No rashes, lesions or ulcers on exposed skin  Psychiatry: Judgement and insight appear normal. Mood & affect appropriate.   Data Reviewed: I have personally reviewed following labs and imaging studies  CBC: Recent Labs  Lab 04/12/23 1954 04/13/23 0714 04/13/23 1339 04/14/23 0230 04/15/23 0202  WBC 12.9* 17.0* 14.8* 13.1* 18.9*  NEUTROABS  --   --  10.0*  --   --   HGB 13.2 13.8 12.8 13.1 12.5  HCT 40.2 41.6 39.5 40.7 38.5  MCV 87.0 86.1 85.7 87.3 87.9  PLT 316 384 383 324 336   Basic Metabolic Panel: Recent Labs  Lab 04/12/23 1954 04/13/23 0714 04/14/23 0230 04/15/23 0202  NA 137 137 134* 136  K 3.0* 3.7 4.5 4.4  CL 105 103 105 102  CO2 25 22 17* 21*  GLUCOSE 109* 100* 114* 109*  BUN 11 11 13 16   CREATININE 0.88 0.86 0.96 0.96  CALCIUM 8.8* 9.2 8.9 9.0  MG 1.9 1.8  --  1.9  PHOS  --  3.7  --   --    GFR: Estimated Creatinine Clearance: 49.3 mL/min (by C-G formula based on SCr of 0.96 mg/dL).  Liver  Function Tests: Recent Labs  Lab 04/12/23 1954  AST 24  ALT 28  ALKPHOS 71  BILITOT 0.5  PROT 6.4*  ALBUMIN 3.0*   CBG: Recent Labs  Lab 04/14/23 1838  GLUCAP 219*   Sepsis Labs: Recent Labs  Lab 04/13/23 1339  PROCALCITON <0.10    Recent Results (from the past 240 hour(s))  MRSA Next Gen by PCR, Nasal     Status: None   Collection Time: 04/13/23 12:40 AM   Specimen: Nasal Mucosa; Nasal Swab  Result Value Ref Range Status   MRSA by PCR Next Gen NOT DETECTED NOT DETECTED Final    Comment: (NOTE) The GeneXpert MRSA Assay (FDA approved for NASAL specimens only), is one component of a comprehensive MRSA colonization surveillance program. It is not intended to diagnose MRSA infection nor to guide or monitor treatment for MRSA infections. Test performance is not FDA approved in patients less than 73 years old. Performed at [facility], [address], [place], [place] [address]   Expectorated Sputum Assessment w Gram Stain, Rflx to Resp Cult     Status: None   Collection Time: 04/13/23 10:36 AM   Specimen: Sputum  Result Value Ref Range Status   Specimen Description SPUTUM  Final   Special Requests NONE  Final   Sputum evaluation   Final    THIS SPECIMEN IS ACCEPTABLE FOR SPUTUM CULTURE Performed at [facility], [address] [place], [place] [address]    Report Status 04/13/2023 FINAL  Final  Culture, Respiratory w Gram Stain     Status: None (Preliminary result)   Collection Time: 04/13/23 10:36 AM   Specimen: SPU  Result Value Ref Range Status   Specimen Description SPUTUM  Final   Special Requests NONE Reflexed from [id number]  Final   Gram Stain   Final    FEW SQUAMOUS EPITHELIAL CELLS PRESENT ABUNDANT GRAM NEGATIVE RODS MODERATE GRAM POSITIVE COCCI FEW WBC PRESENT,BOTH PMN AND MONONUCLEAR    Culture   Final    MODERATE HAEMOPHILUS INFLUENZAE BETA LACTAMASE NEGATIVE Performed at [facility], [address], [place], [place]  [address]    Report Status PENDING  Incomplete  Culture, blood (Routine X 2) w Reflex to ID Panel     Status: None (Preliminary result)   Collection Time: 04/13/23  1:41 PM   Specimen: BLOOD LEFT HAND  Result Value Ref Range Status   Specimen Description BLOOD LEFT HAND  Final   Special Requests   Final    BOTTLES DRAWN AEROBIC AND ANAEROBIC Blood Culture adequate volume  Culture   Final    NO GROWTH 2 DAYS Performed at [facility], [address] [place], [place] [address]    Report Status PENDING  Incomplete  Culture, blood (Routine X 2) w Reflex to ID Panel     Status: None (Preliminary result)   Collection Time: 04/13/23  1:41 PM   Specimen: BLOOD RIGHT HAND  Result Value Ref Range Status   Specimen Description BLOOD RIGHT HAND  Final   Special Requests   Final    BOTTLES DRAWN AEROBIC AND ANAEROBIC Blood Culture adequate volume   Culture   Final    NO GROWTH 2 DAYS Performed at [facility], [address], [place], [place] [address]    Report Status PENDING  Incomplete      Radiology Studies: ECHOCARDIOGRAM LIMITED  Result Date: 04/13/2023    ECHOCARDIOGRAM LIMITED REPORT   Patient Name:   RELLA MISEK Date of Exam: 04/13/2023 Medical Rec #:  [id number]      Height:       62.0 in Accession #:    [id number]     Weight:       127.9 lb Date of Birth:  [birth year]      BSA:          1.581 m Patient Age:    62 years       BP:           94/51 mmHg Patient Gender: F              HR:           65 bpm. Exam Location:  Inpatient Procedure: Limited Echo Indications:    Nstemi  History:        Patient has prior history of Echocardiogram examinations, most                 recent 01/08/2023. Cardiomyopathy; Risk Factors:Current Smoker.  Sonographer:    Darlys Gales Referring Phys: [id number] Vinnie Level SMITH IMPRESSIONS  1. Left ventricular ejection fraction, by estimation, is 20 to 25%. The left ventricle has severely decreased function. The left ventricle has no regional wall motion  abnormalities. The left ventricular internal cavity size was mildly dilated.  2. Right ventricular systolic function is mildly reduced. The right ventricular size is normal.  3. The mitral valve was not well visualized. No evidence of mitral valve regurgitation. No evidence of mitral stenosis.  4. The aortic valve was not well visualized. Aortic valve regurgitation is not visualized. No aortic stenosis is present.  5. Limited echo. Color Doppler not performed. FINDINGS  Left Ventricle: Left ventricular ejection fraction, by estimation, is 20 to 25%. The left ventricle has severely decreased function. The left ventricle has no regional wall motion abnormalities. The left ventricular internal cavity size was mildly dilated. There is no left ventricular hypertrophy. Right Ventricle: The right ventricular size is normal. No increase in right ventricular wall thickness. Right ventricular systolic function is mildly reduced. Left Atrium: Left atrial size was normal in size. Right Atrium: Right atrial size was normal in size. Pericardium: There is no evidence of pericardial effusion. Mitral Valve: The mitral valve was not well visualized. No evidence of mitral valve stenosis. Tricuspid Valve: The tricuspid valve is not well visualized. Tricuspid valve regurgitation is not demonstrated. No evidence of tricuspid stenosis. Aortic Valve: The aortic valve was not well visualized. Aortic valve regurgitation is not visualized. No aortic stenosis is present. Pulmonic Valve: The pulmonic valve was  not assessed. Pulmonic valve regurgitation is not visualized. No evidence of pulmonic stenosis. Aorta: Aortic root could not be assessed. Venous: The inferior vena cava was not well visualized. IAS/Shunts: No atrial level shunt detected by color flow Doppler. Additional Comments: A device lead is visualized.  LEFT VENTRICLE PLAX 2D LVIDd:         5.70 cm LVIDs:         5.20 cm LV PW:         1.10 cm LV IVS:        0.70 cm LVOT diam:      1.80 cm LVOT Area:     2.54 cm  IVC IVC diam: 1.60 cm LEFT ATRIUM             Index        RIGHT ATRIUM          Index LA Vol (A2C):   47.8 ml 30.24 ml/m  RA Area:     9.98 cm LA Vol (A4C):   25.2 ml 15.94 ml/m  RA Volume:   20.50 ml 12.97 ml/m LA Biplane Vol: 36.0 ml 22.77 ml/m   AORTA Ao Root diam: 3.20 cm  SHUNTS Systemic Diam: 1.80 cm Arvilla Meres MD Electronically signed by Arvilla Meres MD Signature Date/Time: 04/13/2023/4:12:06 PM    Final      Scheduled Meds:  arformoterol  15 mcg Nebulization BID   aspirin  81 mg Oral Daily   budesonide (PULMICORT) nebulizer solution  0.5 mg Nebulization BID   Chlorhexidine Gluconate Cloth  6 each Topical Daily   doxycycline  100 mg Oral Q12H   enoxaparin (LOVENOX) injection  40 mg Subcutaneous Q24H   [START ON 04/16/2023] levothyroxine  137 mcg Oral Q0600   levothyroxine  137 mcg Oral Once   predniSONE  40 mg Oral Q breakfast   revefenacin  175 mcg Nebulization Daily   rosuvastatin  40 mg Oral Daily   ticagrelor  90 mg Oral BID   Continuous Infusions:  cefTRIAXone (ROCEPHIN)  IV 2 g (04/15/23 1017)   norepinephrine (LEVOPHED) Adult infusion Stopped (04/14/23 0201)     LOS: 3 days    Time spent: 30 minutes   Sharlene Dory, DO [facility] 04/15/2023, 11:26 AM   Available via Epic secure chat 7am-7pm After these hours, please refer to coverage provider listed on [email or web address]

## 2023-04-15 NOTE — Progress Notes (Addendum)
Pt c/o of 10/10 chest pain radiating to left arm ST 110's, BP 155/108, EKG obtained, 1 sublingual nitro given with pain recheck 1/10. Hr now 88 with BP 110/78 Cardiology paged awaiting call back.  Dr Paulino Rily called back and was notified of above occurrence   Acknowledged. ECG stable from prior. Morphine prn added if pain escalates or unresponsive to NTG.  Marylene Land, MD MPH

## 2023-04-15 NOTE — Progress Notes (Signed)
PA Meng aware critical troponin of 100. No new orders at this time. Patient denies pain or discomfort, will continue to monitor.

## 2023-04-15 NOTE — H&P (View-Only) (Signed)
 Rounding Note    Patient Name: Whitney May Date of Encounter: 04/15/2023  [facility] Cardiologist: McClean  Subjective   Episode of chest pain early AM, resolved after SL NG  Inpatient Medications    Scheduled Meds:  arformoterol  15 mcg Nebulization BID   aspirin  81 mg Oral Daily   budesonide (PULMICORT) nebulizer solution  0.5 mg Nebulization BID   Chlorhexidine Gluconate Cloth  6 each Topical Daily   doxycycline  100 mg Oral Q12H   enoxaparin (LOVENOX) injection  40 mg Subcutaneous Q24H   nitroGLYCERIN       predniSONE  40 mg Oral Q breakfast   revefenacin  175 mcg Nebulization Daily   rosuvastatin  40 mg Oral Daily   ticagrelor  90 mg Oral BID   Continuous Infusions:  cefTRIAXone (ROCEPHIN)  IV Stopped (04/14/23 0958)   norepinephrine (LEVOPHED) Adult infusion Stopped (04/14/23 0201)   PRN Meds: acetaminophen, docusate sodium, guaiFENesin-dextromethorphan, morphine injection, nitroGLYCERIN, nitroGLYCERIN, oxyCODONE-acetaminophen, polyethylene glycol   Vital Signs    Vitals:   04/15/23 0500 04/15/23 0545 04/15/23 0600 04/15/23 0736  BP:  (!) 155/108 110/78 100/69  Pulse:  (!) 103 85 83  Resp:  18 20 19  Temp:    98.2 F (36.8 C)  TempSrc:    Oral  SpO2:  97% 94% 96%  Weight: 57 kg     Height:        Intake/Output Summary (Last 24 hours) at 04/15/2023 0753 Last data filed at 04/14/2023 2012 Gross per 24 hour  Intake 820 ml  Output 600 ml  Net 220 ml      04/15/2023    5:00 AM 04/14/2023    3:06 AM 04/13/2023    1:50 AM  Last 3 Weights  Weight (lbs) 125 lb 10.6 oz 126 lb 8.7 oz 127 lb 13.9 oz  Weight (kg) 57 kg 57.4 kg 58 kg      Telemetry    NSR - Personally Reviewed  ECG    N/a - Personally Reviewed  Physical Exam   GEN: No acute distress.   Neck: No JVD Cardiac: RRR, no murmurs, rubs, or gallops.  Respiratory: Clear to auscultation bilaterally. GI: Soft, nontender, non-distended  MS: No edema; No deformity. Neuro:  Nonfocal   Psych: Normal affect   Labs    High Sensitivity Troponin:   Recent Labs  Lab 04/12/23 1954 04/12/23 2122 04/13/23 0714  TROPONINIHS 10 14 41*     Chemistry Recent Labs  Lab 04/12/23 1954 04/13/23 0714 04/14/23 0230 04/15/23 0202  NA 137 137 134* 136  K 3.0* 3.7 4.5 4.4  CL 105 103 105 102  CO2 25 22 17* 21*  GLUCOSE 109* 100* 114* 109*  BUN 11 11 13 16  CREATININE 0.88 0.86 0.96 0.96  CALCIUM 8.8* 9.2 8.9 9.0  MG 1.9 1.8  --  1.9  PROT 6.4*  --   --   --   ALBUMIN 3.0*  --   --   --   AST 24  --   --   --   ALT 28  --   --   --   ALKPHOS 71  --   --   --   BILITOT 0.5  --   --   --   GFRNONAA >60 >60 >60 >60  ANIONGAP 7 12 12 13    Lipids No results for input(s): "CHOL", "TRIG", "HDL", "LABVLDL", "LDLCALC", "CHOLHDL" in the last 168 hours.  Hematology   Recent Labs  Lab 04/13/23 1339 04/14/23 0230 04/15/23 0202  WBC 14.8* 13.1* 18.9*  RBC 4.61 4.66 4.38  HGB 12.8 13.1 12.5  HCT 39.5 40.7 38.5  MCV 85.7 87.3 87.9  MCH 27.8 28.1 28.5  MCHC 32.4 32.2 32.5  RDW 15.2 15.3 15.3  PLT 383 324 336   Thyroid No results for input(s): "TSH", "FREET4" in the last 168 hours.  BNP Recent Labs  Lab 04/15/23 0202  BNP 469.7*    DDimer No results for input(s): "DDIMER" in the last 168 hours.   Radiology    VAS US LOWER EXTREMITY VENOUS (DVT)  Result Date: 04/14/2023  Lower Venous DVT Study Patient Name:  Altair T Tatar  Date of Exam:   04/13/2023 Medical Rec #: [id number]       Accession #:    [id number] Date of Birth: 11/28/1960       Patient Gender: F Patient Age:   62 years Exam Location:  [facility] Procedure:      VAS US LOWER EXTREMITY VENOUS (DVT) Referring Phys: DANIEL SMITH --------------------------------------------------------------------------------  Indications: Swelling.  Comparison Study: No priors. Performing Technologist: Rita Sturdivant RDMS, RVT  Examination Guidelines: A complete evaluation includes B-mode imaging, spectral Doppler, color  Doppler, and power Doppler as needed of all accessible portions of each vessel. Bilateral testing is considered an integral part of a complete examination. Limited examinations for reoccurring indications may be performed as noted. The reflux portion of the exam is performed with the patient in reverse Trendelenburg.  +---------+---------------+---------+-----------+----------+--------------+ RIGHT    CompressibilityPhasicitySpontaneityPropertiesThrombus Aging +---------+---------------+---------+-----------+----------+--------------+ CFV      Partial        Yes      Yes                                 +---------+---------------+---------+-----------+----------+--------------+ SFJ      Full                                                        +---------+---------------+---------+-----------+----------+--------------+ FV Prox  Full                                                        +---------+---------------+---------+-----------+----------+--------------+ FV Mid   Full                                                        +---------+---------------+---------+-----------+----------+--------------+ FV DistalFull                                                        +---------+---------------+---------+-----------+----------+--------------+ PFV      Full                                                        +---------+---------------+---------+-----------+----------+--------------+   POP      Full           Yes      Yes                                 +---------+---------------+---------+-----------+----------+--------------+ PTV      Full                                                        +---------+---------------+---------+-----------+----------+--------------+ PERO     Full                                                        +---------+---------------+---------+-----------+----------+--------------+    +---------+---------------+---------+-----------+----------+--------------+ LEFT     CompressibilityPhasicitySpontaneityPropertiesThrombus Aging +---------+---------------+---------+-----------+----------+--------------+ CFV      Full           Yes      Yes                                 +---------+---------------+---------+-----------+----------+--------------+ SFJ      Full                                                        +---------+---------------+---------+-----------+----------+--------------+ FV Prox  Full                                                        +---------+---------------+---------+-----------+----------+--------------+ FV Mid   Full                                                        +---------+---------------+---------+-----------+----------+--------------+ FV DistalFull                                                        +---------+---------------+---------+-----------+----------+--------------+ PFV      Full                                                        +---------+---------------+---------+-----------+----------+--------------+ POP      Full           Yes      Yes                                 +---------+---------------+---------+-----------+----------+--------------+   PTV      Full                                                        +---------+---------------+---------+-----------+----------+--------------+ PERO     Full                                                        +---------+---------------+---------+-----------+----------+--------------+     Summary: BILATERAL: - No evidence of deep vein thrombosis seen in the lower extremities, bilaterally. -No evidence of popliteal cyst, bilaterally.   *See table(s) above for measurements and observations. Electronically signed by Joshua Robins on 04/14/2023 at 9:35:32 AM.    Final    ECHOCARDIOGRAM LIMITED  Result Date: 04/13/2023     ECHOCARDIOGRAM LIMITED REPORT   Patient Name:   Dezarae T Pavon Date of Exam: 04/13/2023 Medical Rec #:  [id number]      Height:       62.0 in Accession #:    [id number]     Weight:       127.9 lb Date of Birth:  12/07/1960      BSA:          1.581 m Patient Age:    62 years       BP:           94/51 mmHg Patient Gender: F              HR:           65 bpm. Exam Location:  Inpatient Procedure: Limited Echo Indications:    Nstemi  History:        Patient has prior history of Echocardiogram examinations, most                 recent 01/08/2023. Cardiomyopathy; Risk Factors:Current Smoker.  Sonographer:    Samuel Clark Referring Phys: [id number] DANIEL C SMITH IMPRESSIONS  1. Left ventricular ejection fraction, by estimation, is 20 to 25%. The left ventricle has severely decreased function. The left ventricle has no regional wall motion abnormalities. The left ventricular internal cavity size was mildly dilated.  2. Right ventricular systolic function is mildly reduced. The right ventricular size is normal.  3. The mitral valve was not well visualized. No evidence of mitral valve regurgitation. No evidence of mitral stenosis.  4. The aortic valve was not well visualized. Aortic valve regurgitation is not visualized. No aortic stenosis is present.  5. Limited echo. Color Doppler not performed. FINDINGS  Left Ventricle: Left ventricular ejection fraction, by estimation, is 20 to 25%. The left ventricle has severely decreased function. The left ventricle has no regional wall motion abnormalities. The left ventricular internal cavity size was mildly dilated. There is no left ventricular hypertrophy. Right Ventricle: The right ventricular size is normal. No increase in right ventricular wall thickness. Right ventricular systolic function is mildly reduced. Left Atrium: Left atrial size was normal in size. Right Atrium: Right atrial size was normal in size. Pericardium: There is no evidence of pericardial effusion. Mitral Valve: The  mitral valve was not well visualized. No evidence of mitral valve stenosis. Tricuspid Valve: The tricuspid valve is not well visualized.   Tricuspid valve regurgitation is not demonstrated. No evidence of tricuspid stenosis. Aortic Valve: The aortic valve was not well visualized. Aortic valve regurgitation is not visualized. No aortic stenosis is present. Pulmonic Valve: The pulmonic valve was not assessed. Pulmonic valve regurgitation is not visualized. No evidence of pulmonic stenosis. Aorta: Aortic root could not be assessed. Venous: The inferior vena cava was not well visualized. IAS/Shunts: No atrial level shunt detected by color flow Doppler. Additional Comments: A device lead is visualized.  LEFT VENTRICLE PLAX 2D LVIDd:         5.70 cm LVIDs:         5.20 cm LV PW:         1.10 cm LV IVS:        0.70 cm LVOT diam:     1.80 cm LVOT Area:     2.54 cm  IVC IVC diam: 1.60 cm LEFT ATRIUM             Index        RIGHT ATRIUM          Index LA Vol (A2C):   47.8 ml 30.24 ml/m  RA Area:     9.98 cm LA Vol (A4C):   25.2 ml 15.94 ml/m  RA Volume:   20.50 ml 12.97 ml/m LA Biplane Vol: 36.0 ml 22.77 ml/m   AORTA Ao Root diam: 3.20 cm  SHUNTS Systemic Diam: 1.80 cm Daniel Bensimhon MD Electronically signed by Daniel Bensimhon MD Signature Date/Time: 04/13/2023/4:12:06 PM    Final     Cardiac Studies     Patient Profile     Maribel T Bessey is a 62 y.o. female with a history of CAD (NSTEMI in 07/2018 s/p PCI with DES to RCA, NSTEMI in 03/2019 due to in-stent restenosis of RCA with overlapping DES x2, and STEMI in 09/2022 s/p DES to ostial PLA/ PDA branches and PTCA of the entire previously stented RCA), ischemic cardiomyopathy/ chronic HFrEF of 25-30% with global hypokinesis, PAD with long occlusion of left SFA, hyperlipidemia, hypothyroidism, and tobacco abuse who is being seen 04/13/2023 for the evaluation of chest pain at the request of Dr. Smith.   Assessment & Plan    1.CAD/chest pain - NSTEMI in 07/2018, DES  to RCA - NSTEMI 03/2019 due to ISR, received DES to RCA x2 - STEMI 09/2022 DES to PLA/PDA and PTCA of RCA stents   - presented with chest pain - CTA no acute aortic pathology - trop 10-->14-->41.  EKG chronic LBBB - echo 04/2023 LVEF 20-25% (01/2023 ehco LVEF 25-30%) - atypical symptoms though similar to prior symptoms at times of prior stents. Plan is for cath Monday.  - medical therapy with ASA 81, crestor 40, brillinta 90mg bid.   - some recurrent pain this AM, EKG this AM chronic LBBB. F/u trops to reassess trend. Plan for cath tomorrow AM - managed for COPD exacerbation, bronchitis by primary team. If not cardiac chest pain may be pleuritic pain.    Shared Decision Making/Informed Consent The risks [stroke (1 in 1000), death (1 in 1000), kidney failure [usually temporary] (1 in 500), bleeding (1 in 200), allergic reaction [possibly serious] (1 in 200)], benefits (diagnostic support and management of coronary artery disease) and alternatives of a cardiac catheterization were discussed in detail with Ms. Guerrier and she is willing to proceed.        2.Chronic HFrEF  echo 04/2023 LVEF 20-25% (01/2023 ehco LVEF 25-30%) - CXR no acute process, BNP pending -   medical therapy limited by low bp's, home regimen on hold. Off levophed, bp's somewhat labile, continue to hold coreg, losartan, aldactone today     3. Hypotension - soft bp's at baseline, presented with SBP 70s  - from notes thought to be due to mulitiple doses of NG taken at home - was on low dose levophed initially, able to be weaned off.    4.COPD  - per primary team - from pulm note suspect possible bronchitis/COPD with perhaps some pleuritic associated pain.   5. Hypothyroidism - she had mentioned had not been receiving her home levothyroxine, restarted today and ordered a TSH for tomorrow  For questions or updates, please contact [facility] Please consult [email or web address] for contact info under         Signed, Eryc Bodey, MD  04/15/2023, 7:53 AM    

## 2023-04-15 NOTE — Progress Notes (Signed)
Rounding Note    Patient Name: Whitney May Date of Encounter: 04/15/2023  [facility] Cardiologist: Jearld Pies  Subjective   Episode of chest pain early AM, resolved after SL NG  Inpatient Medications    Scheduled Meds:  arformoterol  15 mcg Nebulization BID   aspirin  81 mg Oral Daily   budesonide (PULMICORT) nebulizer solution  0.5 mg Nebulization BID   Chlorhexidine Gluconate Cloth  6 each Topical Daily   doxycycline  100 mg Oral Q12H   enoxaparin (LOVENOX) injection  40 mg Subcutaneous Q24H   nitroGLYCERIN       predniSONE  40 mg Oral Q breakfast   revefenacin  175 mcg Nebulization Daily   rosuvastatin  40 mg Oral Daily   ticagrelor  90 mg Oral BID   Continuous Infusions:  cefTRIAXone (ROCEPHIN)  IV Stopped (04/14/23 0958)   norepinephrine (LEVOPHED) Adult infusion Stopped (04/14/23 0201)   PRN Meds: acetaminophen, docusate sodium, guaiFENesin-dextromethorphan, morphine injection, nitroGLYCERIN, nitroGLYCERIN, oxyCODONE-acetaminophen, polyethylene glycol   Vital Signs    Vitals:   04/15/23 0500 04/15/23 0545 04/15/23 0600 04/15/23 0736  BP:  (!) 155/108 110/78 100/69  Pulse:  (!) 103 85 83  Resp:  18 20 19   Temp:    98.2 F (36.8 C)  TempSrc:    Oral  SpO2:  97% 94% 96%  Weight: 57 kg     Height:        Intake/Output Summary (Last 24 hours) at 04/15/2023 0753 Last data filed at 04/14/2023 2012 Gross per 24 hour  Intake 820 ml  Output 600 ml  Net 220 ml      04/15/2023    5:00 AM 04/14/2023    3:06 AM 04/13/2023    1:50 AM  Last 3 Weights  Weight (lbs) 125 lb 10.6 oz 126 lb 8.7 oz 127 lb 13.9 oz  Weight (kg) 57 kg 57.4 kg 58 kg      Telemetry    NSR - Personally Reviewed  ECG    N/a - Personally Reviewed  Physical Exam   GEN: No acute distress.   Neck: No JVD Cardiac: RRR, no murmurs, rubs, or gallops.  Respiratory: Clear to auscultation bilaterally. GI: Soft, nontender, non-distended  MS: No edema; No deformity. Neuro:  Nonfocal   Psych: Normal affect   Labs    High Sensitivity Troponin:   Recent Labs  Lab 04/12/23 1954 04/12/23 2122 04/13/23 0714  TROPONINIHS 10 14 41*     Chemistry Recent Labs  Lab 04/12/23 1954 04/13/23 0714 04/14/23 0230 04/15/23 0202  NA 137 137 134* 136  K 3.0* 3.7 4.5 4.4  CL 105 103 105 102  CO2 25 22 17* 21*  GLUCOSE 109* 100* 114* 109*  BUN 11 11 13 16   CREATININE 0.88 0.86 0.96 0.96  CALCIUM 8.8* 9.2 8.9 9.0  MG 1.9 1.8  --  1.9  PROT 6.4*  --   --   --   ALBUMIN 3.0*  --   --   --   AST 24  --   --   --   ALT 28  --   --   --   ALKPHOS 71  --   --   --   BILITOT 0.5  --   --   --   GFRNONAA >60 >60 >60 >60  ANIONGAP 7 12 12 13     Lipids No results for input(s): "CHOL", "TRIG", "HDL", "LABVLDL", "LDLCALC", "CHOLHDL" in the last 168 hours.  Hematology  Recent Labs  Lab 04/13/23 1339 04/14/23 0230 04/15/23 0202  WBC 14.8* 13.1* 18.9*  RBC 4.61 4.66 4.38  HGB 12.8 13.1 12.5  HCT 39.5 40.7 38.5  MCV 85.7 87.3 87.9  MCH 27.8 28.1 28.5  MCHC 32.4 32.2 32.5  RDW 15.2 15.3 15.3  PLT 383 324 336   Thyroid No results for input(s): "TSH", "FREET4" in the last 168 hours.  BNP Recent Labs  Lab 04/15/23 0202  BNP 469.7*    DDimer No results for input(s): "DDIMER" in the last 168 hours.   Radiology    VAS [place] LOWER EXTREMITY VENOUS (DVT)  Result Date: 04/14/2023  Lower Venous DVT Study Patient Name:  Whitney May  Date of Exam:   04/13/2023 Medical Rec #: [id number]       Accession #:    [id number] Date of Birth: 08/10/1961       Patient Gender: F Patient Age:   62 years Exam Location:  [facility] Procedure:      VAS [place] LOWER EXTREMITY VENOUS (DVT) Referring Phys: Levon Hedger --------------------------------------------------------------------------------  Indications: Swelling.  Comparison Study: No priors. Performing Technologist: Marilynne Halsted RDMS, RVT  Examination Guidelines: A complete evaluation includes B-mode imaging, spectral Doppler, color  Doppler, and power Doppler as needed of all accessible portions of each vessel. Bilateral testing is considered an integral part of a complete examination. Limited examinations for reoccurring indications may be performed as noted. The reflux portion of the exam is performed with the patient in reverse Trendelenburg.  +---------+---------------+---------+-----------+----------+--------------+ RIGHT    CompressibilityPhasicitySpontaneityPropertiesThrombus Aging +---------+---------------+---------+-----------+----------+--------------+ CFV      Partial        Yes      Yes                                 +---------+---------------+---------+-----------+----------+--------------+ SFJ      Full                                                        +---------+---------------+---------+-----------+----------+--------------+ FV Prox  Full                                                        +---------+---------------+---------+-----------+----------+--------------+ FV Mid   Full                                                        +---------+---------------+---------+-----------+----------+--------------+ FV DistalFull                                                        +---------+---------------+---------+-----------+----------+--------------+ PFV      Full                                                        +---------+---------------+---------+-----------+----------+--------------+  POP      Full           Yes      Yes                                 +---------+---------------+---------+-----------+----------+--------------+ PTV      Full                                                        +---------+---------------+---------+-----------+----------+--------------+ PERO     Full                                                        +---------+---------------+---------+-----------+----------+--------------+    +---------+---------------+---------+-----------+----------+--------------+ LEFT     CompressibilityPhasicitySpontaneityPropertiesThrombus Aging +---------+---------------+---------+-----------+----------+--------------+ CFV      Full           Yes      Yes                                 +---------+---------------+---------+-----------+----------+--------------+ SFJ      Full                                                        +---------+---------------+---------+-----------+----------+--------------+ FV Prox  Full                                                        +---------+---------------+---------+-----------+----------+--------------+ FV Mid   Full                                                        +---------+---------------+---------+-----------+----------+--------------+ FV DistalFull                                                        +---------+---------------+---------+-----------+----------+--------------+ PFV      Full                                                        +---------+---------------+---------+-----------+----------+--------------+ POP      Full           Yes      Yes                                 +---------+---------------+---------+-----------+----------+--------------+  PTV      Full                                                        +---------+---------------+---------+-----------+----------+--------------+ PERO     Full                                                        +---------+---------------+---------+-----------+----------+--------------+     Summary: BILATERAL: - No evidence of deep vein thrombosis seen in the lower extremities, bilaterally. -No evidence of popliteal cyst, bilaterally.   *See table(s) above for measurements and observations. Electronically signed by Gerarda Fraction on 04/14/2023 at 9:35:32 AM.    Final    ECHOCARDIOGRAM LIMITED  Result Date: 04/13/2023     ECHOCARDIOGRAM LIMITED REPORT   Patient Name:   Whitney May Date of Exam: 04/13/2023 Medical Rec #:  [id number]      Height:       62.0 in Accession #:    [id number]     Weight:       127.9 lb Date of Birth:  10-29-1961      BSA:          1.581 m Patient Age:    62 years       BP:           94/51 mmHg Patient Gender: F              HR:           65 bpm. Exam Location:  Inpatient Procedure: Limited Echo Indications:    Nstemi  History:        Patient has prior history of Echocardiogram examinations, most                 recent 01/08/2023. Cardiomyopathy; Risk Factors:Current Smoker.  Sonographer:    Darlys Gales Referring Phys: [id number] Vinnie Level SMITH IMPRESSIONS  1. Left ventricular ejection fraction, by estimation, is 20 to 25%. The left ventricle has severely decreased function. The left ventricle has no regional wall motion abnormalities. The left ventricular internal cavity size was mildly dilated.  2. Right ventricular systolic function is mildly reduced. The right ventricular size is normal.  3. The mitral valve was not well visualized. No evidence of mitral valve regurgitation. No evidence of mitral stenosis.  4. The aortic valve was not well visualized. Aortic valve regurgitation is not visualized. No aortic stenosis is present.  5. Limited echo. Color Doppler not performed. FINDINGS  Left Ventricle: Left ventricular ejection fraction, by estimation, is 20 to 25%. The left ventricle has severely decreased function. The left ventricle has no regional wall motion abnormalities. The left ventricular internal cavity size was mildly dilated. There is no left ventricular hypertrophy. Right Ventricle: The right ventricular size is normal. No increase in right ventricular wall thickness. Right ventricular systolic function is mildly reduced. Left Atrium: Left atrial size was normal in size. Right Atrium: Right atrial size was normal in size. Pericardium: There is no evidence of pericardial effusion. Mitral Valve: The  mitral valve was not well visualized. No evidence of mitral valve stenosis. Tricuspid Valve: The tricuspid valve is not well visualized.  Tricuspid valve regurgitation is not demonstrated. No evidence of tricuspid stenosis. Aortic Valve: The aortic valve was not well visualized. Aortic valve regurgitation is not visualized. No aortic stenosis is present. Pulmonic Valve: The pulmonic valve was not assessed. Pulmonic valve regurgitation is not visualized. No evidence of pulmonic stenosis. Aorta: Aortic root could not be assessed. Venous: The inferior vena cava was not well visualized. IAS/Shunts: No atrial level shunt detected by color flow Doppler. Additional Comments: A device lead is visualized.  LEFT VENTRICLE PLAX 2D LVIDd:         5.70 cm LVIDs:         5.20 cm LV PW:         1.10 cm LV IVS:        0.70 cm LVOT diam:     1.80 cm LVOT Area:     2.54 cm  IVC IVC diam: 1.60 cm LEFT ATRIUM             Index        RIGHT ATRIUM          Index LA Vol (A2C):   47.8 ml 30.24 ml/m  RA Area:     9.98 cm LA Vol (A4C):   25.2 ml 15.94 ml/m  RA Volume:   20.50 ml 12.97 ml/m LA Biplane Vol: 36.0 ml 22.77 ml/m   AORTA Ao Root diam: 3.20 cm  SHUNTS Systemic Diam: 1.80 cm Arvilla Meres MD Electronically signed by Arvilla Meres MD Signature Date/Time: 04/13/2023/4:12:06 PM    Final     Cardiac Studies     Patient Profile     Whitney May is a 62 y.o. female with a history of CAD (NSTEMI in 07/2018 s/p PCI with DES to RCA, NSTEMI in 03/2019 due to in-stent restenosis of RCA with overlapping DES x2, and STEMI in 09/2022 s/p DES to ostial PLA/ PDA branches and PTCA of the entire previously stented RCA), ischemic cardiomyopathy/ chronic HFrEF of 25-30% with global hypokinesis, PAD with long occlusion of left SFA, hyperlipidemia, hypothyroidism, and tobacco abuse who is being seen 04/13/2023 for the evaluation of chest pain at the request of Dr. Katrinka Blazing.   Assessment & Plan    1.CAD/chest pain - NSTEMI in 07/2018, DES  to RCA - NSTEMI 03/2019 due to ISR, received DES to RCA x2 - STEMI 09/2022 DES to PLA/PDA and PTCA of RCA stents   - presented with chest pain - CTA no acute aortic pathology - trop 10-->14-->41.  EKG chronic LBBB - echo 04/2023 LVEF 20-25% (01/2023 ehco LVEF 25-30%) - atypical symptoms though similar to prior symptoms at times of prior stents. Plan is for cath Monday.  - medical therapy with ASA 81, crestor 40, brillinta 90mg  bid.   - some recurrent pain this AM, EKG this AM chronic LBBB. F/u trops to reassess trend. Plan for cath tomorrow AM - managed for COPD exacerbation, bronchitis by primary team. If not cardiac chest pain may be pleuritic pain.    Shared Decision Making/Informed Consent The risks [stroke (1 in 1000), death (1 in 1000), kidney failure [usually temporary] (1 in 500), bleeding (1 in 200), allergic reaction [possibly serious] (1 in 200)], benefits (diagnostic support and management of coronary artery disease) and alternatives of a cardiac catheterization were discussed in detail with Ms. Biesecker and she is willing to proceed.        2.Chronic HFrEF  echo 04/2023 LVEF 20-25% (01/2023 ehco LVEF 25-30%) - CXR no acute process, BNP pending -  medical therapy limited by low bp's, home regimen on hold. Off levophed, bp's somewhat labile, continue to hold coreg, losartan, aldactone today     3. Hypotension - soft bp's at baseline, presented with SBP 70s  - from notes thought to be due to mulitiple doses of NG taken at home - was on low dose levophed initially, able to be weaned off.    4.COPD  - per primary team - from pulm note suspect possible bronchitis/COPD with perhaps some pleuritic associated pain.   5. Hypothyroidism - she had mentioned had not been receiving her home levothyroxine, restarted today and ordered a TSH for tomorrow  For questions or updates, please contact [facility] Please consult [email or web address] for contact info under         Signed, Dina Rich, MD  04/15/2023, 7:53 AM

## 2023-04-16 ENCOUNTER — Inpatient Hospital Stay (HOSPITAL_COMMUNITY)
Admission: EM | Disposition: A | Discharge status: Home / Self Care | Payer: Self-pay | Source: Home / Self Care | Attending: Internal Medicine

## 2023-04-16 DIAGNOSIS — I214 Non-ST elevation (NSTEMI) myocardial infarction: Secondary | ICD-10-CM

## 2023-04-16 DIAGNOSIS — I5042 Chronic combined systolic (congestive) and diastolic (congestive) heart failure: Secondary | ICD-10-CM

## 2023-04-16 DIAGNOSIS — I25110 Atherosclerotic heart disease of native coronary artery with unstable angina pectoris: Secondary | ICD-10-CM | POA: Diagnosis not present

## 2023-04-16 DIAGNOSIS — I959 Hypotension, unspecified: Secondary | ICD-10-CM

## 2023-04-16 DIAGNOSIS — E785 Hyperlipidemia, unspecified: Secondary | ICD-10-CM | POA: Diagnosis not present

## 2023-04-16 DIAGNOSIS — Z720 Tobacco use: Secondary | ICD-10-CM

## 2023-04-16 HISTORY — PX: LEFT HEART CATH AND CORONARY ANGIOGRAPHY: CATH118249

## 2023-04-16 LAB — CBC
HCT: 35.1 % — ABNORMAL LOW (ref 36.0–46.0)
Hemoglobin: 11.3 g/dL — ABNORMAL LOW (ref 12.0–15.0)
MCH: 28.8 pg (ref 26.0–34.0)
MCHC: 32.2 g/dL (ref 30.0–36.0)
MCV: 89.3 fL (ref 80.0–100.0)
Platelets: 322 10*3/uL (ref 150–400)
RBC: 3.93 MIL/uL (ref 3.87–5.11)
RDW: 15.8 % — ABNORMAL HIGH (ref 11.5–15.5)
WBC: 15.9 10*3/uL — ABNORMAL HIGH (ref 4.0–10.5)
nRBC: 0 % (ref 0.0–0.2)

## 2023-04-16 LAB — BASIC METABOLIC PANEL
Anion gap: 8 (ref 5–15)
BUN: 17 mg/dL (ref 6–20)
CO2: 23 mmol/L (ref 22–32)
Calcium: 8.8 mg/dL — ABNORMAL LOW (ref 8.9–10.3)
Chloride: 104 mmol/L (ref 98–111)
Creatinine, Ser: 0.78 mg/dL (ref 0.44–1.00)
GFR, Estimated: >60 mL/min (ref >60)
Glucose, Bld: 93 mg/dL (ref 70–99)
Potassium: 4.6 mmol/L (ref 3.5–5.1)
Sodium: 135 mmol/L (ref 135–145)

## 2023-04-16 LAB — CULTURE, BLOOD (ROUTINE X 2)
Special Requests: ADEQUATE
Special Requests: ADEQUATE

## 2023-04-16 LAB — TSH: TSH: <0.01 u[IU]/mL — ABNORMAL LOW (ref 0.350–4.500)

## 2023-04-16 LAB — CULTURE, RESPIRATORY W GRAM STAIN

## 2023-04-16 LAB — T4, FREE: Free T4: 1.52 ng/dL — ABNORMAL HIGH (ref 0.61–1.12)

## 2023-04-16 LAB — MAGNESIUM: Magnesium: 2.1 mg/dL (ref 1.7–2.4)

## 2023-04-16 SURGERY — LEFT HEART CATH AND CORONARY ANGIOGRAPHY
Anesthesia: LOCAL

## 2023-04-16 MED ORDER — SODIUM CHLORIDE 0.9% FLUSH
3.0000 mL | Freq: Two times a day (BID) | INTRAVENOUS | Status: DC
  Administered 2023-04-16 – 2023-04-17 (×2): 3 mL via INTRAVENOUS

## 2023-04-16 MED ORDER — LIDOCAINE HCL (PF) 1 % IJ SOLN
INTRAMUSCULAR | Status: AC
  Filled 2023-04-16: qty 30

## 2023-04-16 MED ORDER — MIDAZOLAM HCL 2 MG/2ML IJ SOLN
INTRAMUSCULAR | Status: AC
  Filled 2023-04-16: qty 2

## 2023-04-16 MED ORDER — LIDOCAINE HCL (PF) 1 % IJ SOLN
INTRAMUSCULAR | Status: DC | PRN
  Administered 2023-04-16: 10 mL

## 2023-04-16 MED ORDER — RANOLAZINE ER 500 MG PO TB12
500.0000 mg | ORAL_TABLET | Freq: Two times a day (BID) | ORAL | Status: DC
  Administered 2023-04-16 – 2023-04-17 (×4): 500 mg via ORAL
  Filled 2023-04-16 (×4): qty 1

## 2023-04-16 MED ORDER — HEPARIN (PORCINE) IN NACL 1000-0.9 UT/500ML-% IV SOLN
INTRAVENOUS | Status: DC | PRN
  Administered 2023-04-16 (×2): 500 mL

## 2023-04-16 MED ORDER — IOHEXOL 350 MG/ML SOLN
INTRAVENOUS | Status: DC | PRN
  Administered 2023-04-16: 55 mL

## 2023-04-16 MED ORDER — EMPAGLIFLOZIN 10 MG PO TABS
10.0000 mg | ORAL_TABLET | Freq: Every day | ORAL | Status: DC
  Administered 2023-04-17: 10 mg via ORAL
  Filled 2023-04-16: qty 1

## 2023-04-16 MED ORDER — VERAPAMIL HCL 2.5 MG/ML IV SOLN
INTRAVENOUS | Status: AC
  Filled 2023-04-16: qty 2

## 2023-04-16 MED ORDER — FENTANYL CITRATE (PF) 100 MCG/2ML IJ SOLN
INTRAMUSCULAR | Status: AC
  Filled 2023-04-16: qty 2

## 2023-04-16 MED ORDER — SODIUM CHLORIDE 0.9 % IV BOLUS
250.0000 mL | Freq: Once | INTRAVENOUS | Status: AC
  Administered 2023-04-16: 250 mL via INTRAVENOUS

## 2023-04-16 MED ORDER — FENTANYL CITRATE (PF) 100 MCG/2ML IJ SOLN
INTRAMUSCULAR | Status: DC | PRN
  Administered 2023-04-16: 25 ug via INTRAVENOUS

## 2023-04-16 MED ORDER — MIDAZOLAM HCL 2 MG/2ML IJ SOLN
INTRAMUSCULAR | Status: DC | PRN
  Administered 2023-04-16: 1 mg via INTRAVENOUS

## 2023-04-16 MED ORDER — HEPARIN SODIUM (PORCINE) 1000 UNIT/ML IJ SOLN
INTRAMUSCULAR | Status: AC
  Filled 2023-04-16: qty 10

## 2023-04-16 MED ORDER — SODIUM CHLORIDE 0.9 % IV SOLN: INTRAVENOUS | Status: AC

## 2023-04-16 MED ORDER — LABETALOL HCL 5 MG/ML IV SOLN: 10.0000 mg | INTRAVENOUS | Status: AC | PRN

## 2023-04-16 MED ORDER — ENOXAPARIN SODIUM 40 MG/0.4ML IJ SOSY
40.0000 mg | PREFILLED_SYRINGE | INTRAMUSCULAR | Status: DC
  Administered 2023-04-17: 40 mg via SUBCUTANEOUS
  Filled 2023-04-16: qty 0.4

## 2023-04-16 MED ORDER — TICAGRELOR 90 MG PO TABS: 90.0000 mg | ORAL_TABLET | Freq: Two times a day (BID) | ORAL | Status: DC

## 2023-04-16 MED ORDER — LEVOTHYROXINE SODIUM 100 MCG PO TABS
100.0000 ug | ORAL_TABLET | Freq: Every day | ORAL | Status: DC
  Administered 2023-04-17: 100 ug via ORAL
  Filled 2023-04-16: qty 1

## 2023-04-16 MED ORDER — SODIUM CHLORIDE 0.9 % IV SOLN: 250.0000 mL | INTRAVENOUS | Status: DC | PRN

## 2023-04-16 MED ORDER — SODIUM CHLORIDE 0.9% FLUSH: 3.0000 mL | INTRAVENOUS | Status: DC | PRN

## 2023-04-16 MED ORDER — ROSUVASTATIN CALCIUM 20 MG PO TABS: 40.0000 mg | ORAL_TABLET | Freq: Every day | ORAL | Status: DC

## 2023-04-16 MED ORDER — HYDRALAZINE HCL 20 MG/ML IJ SOLN: 10.0000 mg | INTRAMUSCULAR | Status: AC | PRN

## 2023-04-16 MED ORDER — ONDANSETRON HCL 4 MG/2ML IJ SOLN: 4.0000 mg | Freq: Four times a day (QID) | INTRAMUSCULAR | Status: DC | PRN

## 2023-04-16 SURGICAL SUPPLY — 13 items
CATH INFINITI 5FR MULTPACK ANG (CATHETERS) IMPLANT
CLOSURE MYNX CONTROL 5F (Vascular Products) IMPLANT
GLIDESHEATH SLEND SS 6F .021 (SHEATH) IMPLANT
GUIDEWIRE INQWIRE 1.5J.035X260 (WIRE) IMPLANT
INQWIRE 1.5J .035X260CM (WIRE) ×1
KIT HEART LEFT (KITS) ×1 IMPLANT
KIT MICROPUNCTURE NIT STIFF (SHEATH) IMPLANT
PACK CARDIAC CATHETERIZATION (CUSTOM PROCEDURE TRAY) ×1 IMPLANT
SHEATH PINNACLE 5F 10CM (SHEATH) IMPLANT
SHEATH PROBE COVER 6X72 (BAG) IMPLANT
TRANSDUCER W/STOPCOCK (MISCELLANEOUS) ×1 IMPLANT
TUBING CIL FLEX 10 FLL-RA (TUBING) ×1 IMPLANT
WIRE EMERALD 3MM-J .035X150CM (WIRE) IMPLANT

## 2023-04-16 NOTE — Progress Notes (Addendum)
Heart Failure Navigator Progress Note  Assessed for Heart & Vascular TOC clinic readiness.  Patient is already established with advanced heart failure clinic.    Messaged our scheduler to get post hospital follow up appt. Scheduled for 6/18 @ 2pm.  Sharen Hones, PharmD, BCPS Heart Failure Stewardship Pharmacist Phone [phone]

## 2023-04-16 NOTE — Progress Notes (Signed)
PROGRESS NOTE Whitney May  WUJ:[id number] DOB: 09/09/1961 DOA: 04/12/2023 PCP: Shelby Dubin, FNP  Brief Narrative/Hospital Course: 62 year old female with CAD s/p NSTEMI in 2020 and STEMI in 2023 on DAPT, hyperlipidemia, HFrEF, and tobacco abuse admitted to on 6/6 6 for continued chest pain despite taking nitroglycerin 3 times. She had been dizzy and noted to be hypotensive, started on Levophed. She was transferred to: ICU due to pressor requirements. Blood pressure improved and she was transferred to the floor in 04/14/2023. S/o cardiac cath 6//10     Subjective: Seen and examined Just back from cath this am Seem somewhat upset and not much communicative   Assessment and Plan: Principal Problem:   Hypotension Active Problems:   Hypothyroidism   Hyperlipidemia, mixed   Hypotension, iatrogenic   Coronary artery disease involving native coronary artery with angina pectoris (HCC)  NSTEMI Chest pain Hx of CAD w. Prior PCI: [facility] 6/10> ISR in distal RCA and new stenosis in pRCA-medical therapy advised, cardio on board, cont asa, brilinta, coreg. Ranexa started 6/1-, resuming coreg tonight, losarta and aldactone on hold  Chronic, mild heart failure ICM: Need for CRTD. Cont to monitor daily I/O,weight, electrolytes and net balance as below.Keep on  salt/fluid restricted diet and monitor in tele.Net IO Since Admission: -786.06 mL [04/16/23 1146]  Filed Weights   04/14/23 0306 04/15/23 0500 04/16/23 0544  Weight: 57.4 kg 57 kg 57 kg    Community-acquired pneumonia continue ceftriaxone doxycycline bronchodilators antitussives, prednisone x 7 days  Hypothyroidism: ON Levothyroxine 137 mcg daily-TSH suppressed less than 0.01 Free T4 elevated 1.5, will decrease the dose of levothyroxine   Hyperlipidemia, mixed: cont Crestor  Tobacco abuse cessation advise prior to d/c  DVT prophylaxis: enoxaparin (LOVENOX) injection 40 mg Start: 04/17/23 0800 Code Status:   Code Status: Full Code Family  Communication: plan of care discussed with patient at bedside. Patient status is: Inpatient because of chronic CHF, need for CRT-D Level of care: Telemetry Cardiac   Dispo: The patient is from: home            Anticipated disposition: home once cleared by cardiology1-2 days Objective: Vitals last 24 hrs: Vitals:   04/16/23 0441 04/16/23 0544 04/16/23 0733 04/16/23 0849  BP: 99/71 124/72  (!) 99/49  Pulse: 72 73  62  Resp: 12 16  19   Temp: 97.9 F (36.6 C) 97.8 F (36.6 C)  98.5 F (36.9 C)  TempSrc: Oral Oral  Oral  SpO2: 94% 97% 98% 97%  Weight:  57 kg    Height:       Weight change: 0 kg  Physical Examination: General exam: alert awake, older than stated age HEENT:Oral mucosa moist, Ear/Nose WNL grossly Respiratory system: bilaterally clear BS, no use of accessory muscle Cardiovascular system: S1 & S2 +, No JVD. Gastrointestinal system: Abdomen soft,NT,ND, BS+ Nervous System:Alert, awake, moving extremities. Extremities: LE edema neg,distal peripheral pulses palpable.  Skin: No rashes,no icterus. MSK: Normal muscle bulk,tone, power  Medications reviewed:  Scheduled Meds:  arformoterol  15 mcg Nebulization BID   aspirin  81 mg Oral Daily   budesonide (PULMICORT) nebulizer solution  0.5 mg Nebulization BID   Chlorhexidine Gluconate Cloth  6 each Topical Daily   doxycycline  100 mg Oral Q12H   [START ON 04/17/2023] empagliflozin  10 mg Oral QAC breakfast   [START ON 04/17/2023] enoxaparin (LOVENOX) injection  40 mg Subcutaneous Q24H   levothyroxine  137 mcg Oral Q0600   predniSONE  40 mg Oral Q breakfast  ranolazine  500 mg Oral BID   revefenacin  175 mcg Nebulization Daily   rosuvastatin  40 mg Oral Daily   sodium chloride flush  3 mL Intravenous Q12H   sodium chloride flush  3 mL Intravenous Q12H   ticagrelor  90 mg Oral BID   Continuous Infusions:  sodium chloride     sodium chloride     cefTRIAXone (ROCEPHIN)  IV 2 g (04/16/23 8657)      Diet Order              Diet Heart Room service appropriate? Yes; Fluid consistency: Thin  Diet effective now                  Intake/Output Summary (Last 24 hours) at 04/16/2023 1005 Last data filed at 04/16/2023 0600 Gross per 24 hour  Intake 244.2 ml  Output --  Net 244.2 ml   Net IO Since Admission: -786.06 mL [04/16/23 1005]  Wt Readings from Last 3 Encounters:  04/16/23 57 kg  01/08/23 56.5 kg  11/17/22 58.1 kg     Unresulted Labs (From admission, onward)     Start     Ordered   04/23/23 0500  Creatinine, serum  (enoxaparin (LOVENOX)    CrCl >/= 30 ml/min)  Weekly,   R     Comments: while on enoxaparin therapy   Question:  Specimen collection method  Answer:  Lab=Lab collect   04/16/23 0855   04/16/23 0623  T4, free  Add-on,   AD       Question:  Specimen collection method  Answer:  Lab=Lab collect   04/16/23 0622   04/15/23 0500  Magnesium  Daily,   R     Question:  Specimen collection method  Answer:  Lab=Lab collect   04/14/23 0758   04/14/23 0500  CBC  Daily,   R     Question:  Specimen collection method  Answer:  Lab=Lab collect   04/13/23 0826   04/14/23 0500  Basic metabolic panel  Daily,   R     Question:  Specimen collection method  Answer:  Lab=Lab collect   04/13/23 0926          Data Reviewed: I have personally reviewed following labs and imaging studies CBC: Recent Labs  Lab 04/13/23 0714 04/13/23 1339 04/14/23 0230 04/15/23 0202 04/16/23 0148  WBC 17.0* 14.8* 13.1* 18.9* 15.9*  NEUTROABS  --  10.0*  --   --   --   HGB 13.8 12.8 13.1 12.5 11.3*  HCT 41.6 39.5 40.7 38.5 35.1*  MCV 86.1 85.7 87.3 87.9 89.3  PLT 384 383 324 336 322   Basic Metabolic Panel: Recent Labs  Lab 04/12/23 1954 04/13/23 0714 04/14/23 0230 04/15/23 0202 04/16/23 0148  NA 137 137 134* 136 135  K 3.0* 3.7 4.5 4.4 4.6  CL 105 103 105 102 104  CO2 25 22 17* 21* 23  GLUCOSE 109* 100* 114* 109* 93  BUN 11 11 13 16 17   CREATININE 0.88 0.86 0.96 0.96 0.78  CALCIUM 8.8* 9.2  8.9 9.0 8.8*  MG 1.9 1.8  --  1.9 2.1  PHOS  --  3.7  --   --   --    Recent Labs  Lab 04/12/23 1954  AST 24  ALT 28  ALKPHOS 71  BILITOT 0.5  PROT 6.4*  ALBUMIN 3.0*   CBG: Recent Labs  Lab 04/14/23 1838  GLUCAP 219*   Recent Labs  04/16/23 0148  TSH <0.010*   Sepsis Labs: Recent Labs  Lab 04/13/23 1339  PROCALCITON <0.10    Recent Results (from the past 240 hour(s))  MRSA Next Gen by PCR, Nasal     Status: None   Collection Time: 04/13/23 12:40 AM   Specimen: Nasal Mucosa; Nasal Swab  Result Value Ref Range Status   MRSA by PCR Next Gen NOT DETECTED NOT DETECTED Final    Comment: (NOTE) The GeneXpert MRSA Assay (FDA approved for NASAL specimens only), is one component of a comprehensive MRSA colonization surveillance program. It is not intended to diagnose MRSA infection nor to guide or monitor treatment for MRSA infections. Test performance is not FDA approved in patients less than 76 years old. Performed at [facility], [address], [place], [place] [address]   Expectorated Sputum Assessment w Gram Stain, Rflx to Resp Cult     Status: None   Collection Time: 04/13/23 10:36 AM   Specimen: Sputum  Result Value Ref Range Status   Specimen Description SPUTUM  Final   Special Requests NONE  Final   Sputum evaluation   Final    THIS SPECIMEN IS ACCEPTABLE FOR SPUTUM CULTURE Performed at [facility], [address], [place], [place] [address]    Report Status 04/13/2023 FINAL  Final  Culture, Respiratory w Gram Stain     Status: None (Preliminary result)   Collection Time: 04/13/23 10:36 AM   Specimen: SPU  Result Value Ref Range Status   Specimen Description SPUTUM  Final   Special Requests NONE Reflexed from [id number]  Final   Gram Stain   Final    FEW SQUAMOUS EPITHELIAL CELLS PRESENT ABUNDANT GRAM NEGATIVE RODS MODERATE GRAM POSITIVE COCCI FEW WBC PRESENT,BOTH PMN AND MONONUCLEAR    Culture   Final    MODERATE HAEMOPHILUS  INFLUENZAE BETA LACTAMASE NEGATIVE Performed at [facility], [address], [place], [place] [address]    Report Status PENDING  Incomplete  Culture, blood (Routine X 2) w Reflex to ID Panel     Status: None (Preliminary result)   Collection Time: 04/13/23  1:41 PM   Specimen: BLOOD LEFT HAND  Result Value Ref Range Status   Specimen Description BLOOD LEFT HAND  Final   Special Requests   Final    BOTTLES DRAWN AEROBIC AND ANAEROBIC Blood Culture adequate volume   Culture   Final    NO GROWTH 3 DAYS Performed at [facility], [address] [place], [place] [address]    Report Status PENDING  Incomplete  Culture, blood (Routine X 2) w Reflex to ID Panel     Status: None (Preliminary result)   Collection Time: 04/13/23  1:41 PM   Specimen: BLOOD RIGHT HAND  Result Value Ref Range Status   Specimen Description BLOOD RIGHT HAND  Final   Special Requests   Final    BOTTLES DRAWN AEROBIC AND ANAEROBIC Blood Culture adequate volume   Culture   Final    NO GROWTH 3 DAYS Performed at [facility], [address], [place], [place] [address]    Report Status PENDING  Incomplete    Antimicrobials: Anti-infectives (From admission, onward)    Start     Dose/Rate Route Frequency Ordered Stop   04/14/23 0900  cefTRIAXone (ROCEPHIN) 2 g in sodium chloride 0.9 % 100 mL IVPB        2 g 200 mL/hr over 30 Minutes Intravenous Every  24 hours 04/14/23 0754 04/20/23 0859   04/13/23 1000  doxycycline (VIBRA-TABS) tablet 100 mg        100 mg Oral Every 12 hours 04/13/23 0823 04/20/23 0959      Culture/Microbiology    Component Value Date/Time   SDES BLOOD LEFT HAND 04/13/2023 1341   SDES BLOOD RIGHT HAND 04/13/2023 1341   SPECREQUEST  04/13/2023 1341    BOTTLES DRAWN AEROBIC AND ANAEROBIC Blood Culture adequate volume   SPECREQUEST  04/13/2023 1341    BOTTLES DRAWN AEROBIC AND ANAEROBIC Blood Culture adequate volume   CULT  04/13/2023 1341    NO GROWTH 3 DAYS Performed  at [facility], [address], [place], [place] [address]    CULT  04/13/2023 1341    NO GROWTH 3 DAYS Performed at [facility], [address], [place], [place] [address]    REPTSTATUS PENDING 04/13/2023 1341   REPTSTATUS PENDING 04/13/2023 1341  Radiology Studies: CARDIAC CATHETERIZATION  Result Date: 04/16/2023   Mid LM to Dist LM lesion is 50% stenosed.   Ost RCA to Prox RCA lesion is 70% stenosed. -Denovo disease   The previously placed overlapping stents in the mid to distal RCA is 100% stenosed with 100% stenosed side branch in RPAV.  The RPL system fills via L-R collaterals   RPDA stent is 100% stenosed. The PDA fills via L-R collaterals   LV end diastolic pressure is normal.   There is no aortic valve stenosis.   Would continue Brilinta for now for the short-term to see how she does on 8 anginal medications. SUMMARY Culprit: 100% In Stent Restenosis of overlapping DES in RCA with denovo severe diffuse 70% stenosis of the proximal RCA prio to the stented segment. Collaterals fill the RPL & RPDA retrograde almost to the distal stents. Stable distal LM ~45-50% stenosis with mild diffuse LAD disease. LVEDP 10-12 mmHg RECOMMENDATIONS Images reviewed with Dr. [place].  Based on the fact that the occlusion site is in the overlapping stented segment with extensive disease upstream, recommendation for now would be medical therapy to treat angina.  If unable to control with medications, we could attempt to reopen the distal vessel and stent the proximal vessel, however this would potentially lead to reocclusion. I have added Ranexa 500 mg twice daily based on the patient's low blood pressure.  Not sure if she will be able to tolerate more aggressive therapy. As BP improves, would try to restart at a minimum carvedilol and potentially add Imdur Optimize volume status as increased LV filling pressures would lead to angina from microvascular disease Patient may benefit from CRT-D Bryan Lemma,  MD    LOS: 4 days   Lanae Boast, MD [facility]  04/16/2023, 10:05 AM

## 2023-04-16 NOTE — Interval H&P Note (Signed)
History and Physical Interval Note:  04/16/2023 7:34 AM  Whitney May  has presented today for surgery, with the diagnosis of unstable angina.  The various methods of treatment have been discussed with the patient and family. After consideration of risks, benefits and other options for treatment, the patient has consented to  Procedure(s): LEFT HEART CATH AND CORONARY ANGIOGRAPHY (N/A) as a surgical intervention.  The patient's history has been reviewed, patient examined, no change in status, stable for surgery.  I have reviewed the patient's chart and labs.  Questions were answered to the patient's satisfaction.    Cath Lab Visit (complete for each Cath Lab visit)  Clinical Evaluation Leading to the Procedure:   ACS: Yes.    Non-ACS:    Anginal Classification: CCS IV  Anti-ischemic medical therapy: Minimal Therapy (1 class of medications)  Non-Invasive Test Results: No non-invasive testing performed  Prior CABG: No previous CABG     Bryan Lemma

## 2023-04-16 NOTE — Progress Notes (Addendum)
Rounding Note    Patient Name: Whitney May Date of Encounter: 04/16/2023  [facility] Cardiologist: Marca Ancona, MD   Subjective   Seen just back from cath, she is upset at the results  Inpatient Medications    Scheduled Meds:  arformoterol  15 mcg Nebulization BID   aspirin  81 mg Oral Daily   budesonide (PULMICORT) nebulizer solution  0.5 mg Nebulization BID   Chlorhexidine Gluconate Cloth  6 each Topical Daily   doxycycline  100 mg Oral Q12H   [START ON 04/17/2023] empagliflozin  10 mg Oral QAC breakfast   [START ON 04/17/2023] enoxaparin (LOVENOX) injection  40 mg Subcutaneous Q24H   levothyroxine  137 mcg Oral Q0600   predniSONE  40 mg Oral Q breakfast   ranolazine  500 mg Oral BID   revefenacin  175 mcg Nebulization Daily   rosuvastatin  40 mg Oral Daily   sodium chloride flush  3 mL Intravenous Q12H   sodium chloride flush  3 mL Intravenous Q12H   ticagrelor  90 mg Oral BID   Continuous Infusions:  sodium chloride     sodium chloride     cefTRIAXone (ROCEPHIN)  IV 2 g (04/16/23 0903)   PRN Meds: sodium chloride, acetaminophen, docusate sodium, guaiFENesin-dextromethorphan, hydrALAZINE, labetalol, morphine injection, nitroGLYCERIN, ondansetron (ZOFRAN) IV, oxyCODONE-acetaminophen, polyethylene glycol, sodium chloride flush   Vital Signs    Vitals:   04/16/23 0421 04/16/23 0441 04/16/23 0544 04/16/23 0733  BP: 115/68 99/71 124/72   Pulse:  72 73   Resp: 15 12 16    Temp:  97.9 F (36.6 C) 97.8 F (36.6 C)   TempSrc:  Oral Oral   SpO2: 96% 94% 97% 98%  Weight:   57 kg   Height:        Intake/Output Summary (Last 24 hours) at 04/16/2023 0926 Last data filed at 04/16/2023 0600 Gross per 24 hour  Intake 244.2 ml  Output --  Net 244.2 ml      04/16/2023    5:44 AM 04/15/2023    5:00 AM 04/14/2023    3:06 AM  Last 3 Weights  Weight (lbs) 125 lb 10.6 oz 125 lb 10.6 oz 126 lb 8.7 oz  Weight (kg) 57 kg 57 kg 57.4 kg      Telemetry     Sinus rhythm with HR 70s - Personally Reviewed  ECG    SR HR 76, first degree HB, LBBB, left axis deviation - Personally Reviewed  Physical Exam   GEN: No acute distress.   Neck: No JVD Cardiac: RRR, no murmurs, rubs, or gallops.  Respiratory: Clear to auscultation bilaterally. GI: Soft, nontender, non-distended  MS: No edema; No deformity. Neuro:  Nonfocal  Psych: Normal affect  Groin site  Labs    High Sensitivity Troponin:   Recent Labs  Lab 04/12/23 1954 04/12/23 2122 04/13/23 0714 04/15/23 0845 04/15/23 1042  TROPONINIHS 10 14 41* 100* 111*     Chemistry Recent Labs  Lab 04/12/23 1954 04/13/23 0714 04/14/23 0230 04/15/23 0202 04/16/23 0148  NA 137 137 134* 136 135  K 3.0* 3.7 4.5 4.4 4.6  CL 105 103 105 102 104  CO2 25 22 17* 21* 23  GLUCOSE 109* 100* 114* 109* 93  BUN 11 11 13 16 17   CREATININE 0.88 0.86 0.96 0.96 0.78  CALCIUM 8.8* 9.2 8.9 9.0 8.8*  MG 1.9 1.8  --  1.9 2.1  PROT 6.4*  --   --   --   --  ALBUMIN 3.0*  --   --   --   --   AST 24  --   --   --   --   ALT 28  --   --   --   --   ALKPHOS 71  --   --   --   --   BILITOT 0.5  --   --   --   --   GFRNONAA >60 >60 >60 >60 >60  ANIONGAP 7 12 12 13 8     Lipids No results for input(s): "CHOL", "TRIG", "HDL", "LABVLDL", "LDLCALC", "CHOLHDL" in the last 168 hours.  Hematology Recent Labs  Lab 04/14/23 0230 04/15/23 0202 04/16/23 0148  WBC 13.1* 18.9* 15.9*  RBC 4.66 4.38 3.93  HGB 13.1 12.5 11.3*  HCT 40.7 38.5 35.1*  MCV 87.3 87.9 89.3  MCH 28.1 28.5 28.8  MCHC 32.2 32.5 32.2  RDW 15.3 15.3 15.8*  PLT 324 336 322   Thyroid  Recent Labs  Lab 04/16/23 0148  TSH <0.010*    BNP Recent Labs  Lab 04/15/23 0202  BNP 469.7*    DDimer No results for input(s): "DDIMER" in the last 168 hours.   Radiology    CARDIAC CATHETERIZATION  Result Date: 04/16/2023   Mid LM to Dist LM lesion is 50% stenosed.   Ost RCA to Prox RCA lesion is 70% stenosed. -Denovo disease   The  previously placed overlapping stents in the mid to distal RCA is 100% stenosed with 100% stenosed side branch in RPAV.  The RPL system fills via L-R collaterals   RPDA stent is 100% stenosed. The PDA fills via L-R collaterals   LV end diastolic pressure is normal.   There is no aortic valve stenosis.   Would continue Brilinta for now for the short-term to see how she does on 8 anginal medications. SUMMARY Culprit: 100% In Stent Restenosis of overlapping DES in RCA with denovo severe diffuse 70% stenosis of the proximal RCA prio to the stented segment. Collaterals fill the RPL & RPDA retrograde almost to the distal stents. Stable distal LM ~45-50% stenosis with mild diffuse LAD disease. LVEDP 10-12 mmHg RECOMMENDATIONS Images reviewed with Dr. [place].  Based on the fact that the occlusion site is in the overlapping stented segment with extensive disease upstream, recommendation for now would be medical therapy to treat angina.  If unable to control with medications, we could attempt to reopen the distal vessel and stent the proximal vessel, however this would potentially lead to reocclusion. I have added Ranexa 500 mg twice daily based on the patient's low blood pressure.  Not sure if she will be able to tolerate more aggressive therapy. As BP improves, would try to restart at a minimum carvedilol and potentially add Imdur Optimize volume status as increased LV filling pressures would lead to angina from microvascular disease Patient may benefit from CRT-D Bryan Lemma, MD   Cardiac Studies   Left heart cath 04/16/23:   Mid LM to Dist LM lesion is 50% stenosed.   Ost RCA to Prox RCA lesion is 70% stenosed. -Denovo disease   The previously placed overlapping stents in the mid to distal RCA is 100% stenosed with 100% stenosed side branch in RPAV.  The RPL system fills via L-R collaterals   RPDA stent is 100% stenosed. The PDA fills via L-R collaterals   LV end diastolic pressure is normal.   There is no  aortic valve stenosis.   Would continue Brilinta for  now for the short-term to see how she does on 8 anginal medications.     SUMMARY Culprit: 100% In Stent Restenosis of overlapping DES in RCA with denovo severe diffuse 70% stenosis of the proximal RCA prio to the stented segment. Collaterals fill the RPL & RPDA retrograde almost to the distal stents. Stable distal LM ~45-50% stenosis with mild diffuse LAD disease. LVEDP 10-12 mmHg      RECOMMENDATIONS Images reviewed with Dr. [place].  Based on the fact that the occlusion site is in the overlapping stented segment with extensive disease upstream, recommendation for now would be medical therapy to treat angina.  If unable to control with medications, we could attempt to reopen the distal vessel and stent the proximal vessel, however this would potentially lead to reocclusion. I have added Ranexa 500 mg twice daily based on the patient's low blood pressure.  Not sure if she will be able to tolerate more aggressive therapy. As BP improves, would try to restart at a minimum carvedilol and potentially add Imdur Optimize volume status as increased LV filling pressures would lead to angina from microvascular disease Patient may benefit from CRT-D    Echo 04/13/23: 1. Left ventricular ejection fraction, by estimation, is 20 to 25%. The  left ventricle has severely decreased function. The left ventricle has no  regional wall motion abnormalities. The left ventricular internal cavity  size was mildly dilated.   2. Right ventricular systolic function is mildly reduced. The right  ventricular size is normal.   3. The mitral valve was not well visualized. No evidence of mitral valve  regurgitation. No evidence of mitral stenosis.   4. The aortic valve was not well visualized. Aortic valve regurgitation  is not visualized. No aortic stenosis is present.   5. Limited echo. Color Doppler not performed.    Patient Profile     62 y.o. female with a  history of CAD (NSTEMI in 07/2018 s/p PCI with DES to RCA, NSTEMI in 03/2019 due to in-stent restenosis of RCA with overlapping DES x2, and STEMI in 09/2022 s/p DES to ostial PLA/ PDA branches and PTCA of the entire previously stented RCA), ischemic cardiomyopathy/ chronic HFrEF of 25-30% with global hypokinesis, PAD with long occlusion of left SFA, hyperlipidemia, hypothyroidism, and tobacco abuse who is being seen 04/13/2023 for the evaluation of chest pain   Assessment & Plan    Chest pain Hx of CAD with prior PCI NSTEMI 07/2018 DES-RCA NSTEMI 03/2019 ISR tx with DESx2-RCA STEMI 09/2022 tx DES-PLA/PDA, PTCA to RCA stents NSTEMI 04/2023 with ISR in distal RCA and new stenosis in proximal RCA - medical therapy recommended  - started on 500 mg ranexa BID - continue ASA and brilinta - resume coreg tonight - losartan and spiro no hold   Chronic combined heart failure Ischemic cardiomyopathy Echo this admission with LVEF 20-25%, down from echo 01/2023 read as LVEF 25-30% GDMT difficult due to hypotension 2/2 nitro use Will also need to manage in the setting of anti-anginal regimen - add back coreg  3.125 mg tonight, BP too soft currently - continue jardiance - spiro and losartan on hold   Need for CRT-D - given EF and CAD, pt previously wore a lifevest, no longer wearing - planned for CRT-D - plan referral to EP for CRT-D   Hypotension Likely due to nitro prior to arrival - will add back BB tonight, BP too soft now   Hyperlipidemia with LDL goal < 70 On crestor 40 mg Lpa  9.1 LDL was 16   Current smoker Strongly encouraged cessation     For questions or updates, please contact [facility] Please consult [email or web address] for contact info under        Signed, Marcelino Duster, PA  04/16/2023, 9:26 AM    Patient seen and examined.  Agree with above documentation.  On exam, patient is alert and oriented, regular rate and rhythm, 2/6 systolic murmur, lungs CTAB, no LE  edema.  Cath today showed 100% in-stent restenosis of overlapping DES in RCA with 70% stenosis of proximal RCA, in addition to his stable distal left main 45 to 50% stenosis.  Normal LVEDP.  Medical management recommended, Ranexa added.  Little Ishikawa, MD

## 2023-04-16 NOTE — TOC Initial Note (Addendum)
Transition of Care [facility]) - Initial/Assessment Note    Patient Details  Name: Whitney May MRN: [id number] Date of Birth: September 12, 1961  [facility]) CM/SW Contact:    Elliot Cousin, RN Phone Number: [phone] 04/16/2023, 9:20 AM  Clinical Narrative:   TOC CM spoke to pt and states she lives in home with daughter. She uses Medicaid transportation to appts. No DME in the home. Will continue to follow up for dc needs.                 Expected Discharge Plan: Home/Self Care Barriers to Discharge: Continued Medical Work up   Patient Goals and CMS Choice Patient states their goals for this hospitalization and ongoing recovery are:: wantst to recover          Expected Discharge Plan and Services   Discharge Planning Services: CM Consult   Living arrangements for the past 2 months: Single Family Home                                      Prior Living Arrangements/Services Living arrangements for the past 2 months: Single Family Home Lives with:: Adult Children Patient language and need for interpreter reviewed:: Yes Do you feel safe going back to the place where you live?: Yes      Need for Family Participation in Patient Care: No (Comment) Care giver support system in place?: Yes (comment)   Criminal Activity/Legal Involvement Pertinent to Current Situation/Hospitalization: No - Comment as needed  Activities of Daily Living Home Assistive Devices/Equipment: None ADL Screening (condition at time of admission) Patient's cognitive ability adequate to safely complete daily activities?: Yes Is the patient deaf or have difficulty hearing?: No Does the patient have difficulty seeing, even when wearing glasses/contacts?: No Does the patient have difficulty concentrating, remembering, or making decisions?: No Patient able to express need for assistance with ADLs?: Yes Does the patient have difficulty dressing or bathing?: No Independently performs  ADLs?: Yes (appropriate for developmental age) Does the patient have difficulty walking or climbing stairs?: No Weakness of Legs: None Weakness of Arms/Hands: None  Permission Sought/Granted Permission sought to share information with : Case Manager, Family Supports, PCP Permission granted to share information with : Yes, Verbal Permission Granted  Share Information with NAME: Dub Mikes     Permission granted to share info w Relationship: daughter  Permission granted to share info w Contact Information: [phone]  Emotional Assessment Appearance:: Appears stated age Attitude/Demeanor/Rapport: Engaged Affect (typically observed): Accepting Orientation: : Oriented to Self, Oriented to Place, Oriented to  Time, Oriented to Situation   Psych Involvement: No (comment)  Admission diagnosis:  Hypotension [I95.9] Hypotension, unspecified hypotension type [I95.9] Chest pain, unspecified type [R07.9] Hypotension, iatrogenic [I95.89] Patient Active Problem List   Diagnosis Date Noted   Coronary artery disease involving native coronary artery with angina pectoris (HCC) 04/15/2023   Hypotension, iatrogenic 04/13/2023   Hypotension 04/12/2023   STEMI involving right coronary artery (HCC) 09/18/2022   Ischemic cardiomyopathy 03/08/2019   NSTEMI (non-ST elevated myocardial infarction) (HCC) 03/07/2019   Nodule of upper lobe of left lung 10/21/2018   Hypothyroidism    Hyperlipidemia, mixed    Tobacco abuse    Alcohol use    PCP:  Shelby Dubin, FNP Pharmacy:   [address], [place] - [address] [place] [address] Phone:  [phone] Fax: [phone]     Social Determinants of Health (SDOH) Social History: SDOH Screenings   Food Insecurity: No Food Insecurity (04/13/2023)  Housing: Low Risk  (04/13/2023)  Transportation Needs: No Transportation Needs (04/13/2023)  Utilities: Not At Risk (04/13/2023)  Tobacco Use: Medium Risk (04/12/2023)    SDOH Interventions:     Readmission Risk Interventions     No data to display

## 2023-04-16 NOTE — Progress Notes (Signed)
Patient had an episode of sharp Chest Pain 9/10, Nitro sublingual given, after pain re-assessment patient reports "just dullness".Cardiology notified, we'll continue to monitor.

## 2023-04-16 NOTE — Hospital Course (Signed)
62 year old female with CAD s/p NSTEMI in 2020 and STEMI in 2023 on DAPT, hyperlipidemia, HFrEF, and tobacco abuse admitted to on 6/6 6 for continued chest pain despite taking nitroglycerin 3 times. She had been dizzy and noted to be hypotensive, started on Levophed. She was transferred to: ICU due to pressor requirements. Blood pressure improved and she was transferred to the floor in 04/14/2023. S/o cardiac cath 6//10

## 2023-04-17 ENCOUNTER — Other Ambulatory Visit (HOSPITAL_COMMUNITY): Payer: Self-pay

## 2023-04-17 ENCOUNTER — Encounter (HOSPITAL_COMMUNITY): Payer: Self-pay | Admitting: Cardiology

## 2023-04-17 ENCOUNTER — Other Ambulatory Visit: Payer: Self-pay | Admitting: Physician Assistant

## 2023-04-17 DIAGNOSIS — I5042 Chronic combined systolic (congestive) and diastolic (congestive) heart failure: Secondary | ICD-10-CM | POA: Diagnosis not present

## 2023-04-17 DIAGNOSIS — I214 Non-ST elevation (NSTEMI) myocardial infarction: Secondary | ICD-10-CM | POA: Diagnosis not present

## 2023-04-17 DIAGNOSIS — I255 Ischemic cardiomyopathy: Secondary | ICD-10-CM

## 2023-04-17 LAB — BASIC METABOLIC PANEL
Anion gap: 7 (ref 5–15)
BUN: 17 mg/dL (ref 6–20)
CO2: 22 mmol/L (ref 22–32)
Calcium: 8.5 mg/dL — ABNORMAL LOW (ref 8.9–10.3)
Chloride: 106 mmol/L (ref 98–111)
Creatinine, Ser: 0.8 mg/dL (ref 0.44–1.00)
GFR, Estimated: >60 mL/min (ref >60)
Glucose, Bld: 112 mg/dL — ABNORMAL HIGH (ref 70–99)
Potassium: 5.2 mmol/L — ABNORMAL HIGH (ref 3.5–5.1)
Sodium: 135 mmol/L (ref 135–145)

## 2023-04-17 LAB — CBC
HCT: 33.8 % — ABNORMAL LOW (ref 36.0–46.0)
Hemoglobin: 10.8 g/dL — ABNORMAL LOW (ref 12.0–15.0)
MCH: 28.3 pg (ref 26.0–34.0)
MCHC: 32 g/dL (ref 30.0–36.0)
MCV: 88.7 fL (ref 80.0–100.0)
Platelets: 347 10*3/uL (ref 150–400)
RBC: 3.81 MIL/uL — ABNORMAL LOW (ref 3.87–5.11)
RDW: 16.2 % — ABNORMAL HIGH (ref 11.5–15.5)
WBC: 13.2 10*3/uL — ABNORMAL HIGH (ref 4.0–10.5)
nRBC: 0 % (ref 0.0–0.2)

## 2023-04-17 LAB — MAGNESIUM: Magnesium: 1.9 mg/dL (ref 1.7–2.4)

## 2023-04-17 LAB — CULTURE, BLOOD (ROUTINE X 2)

## 2023-04-17 MED ORDER — LEVOTHYROXINE SODIUM 100 MCG PO TABS
100.0000 ug | ORAL_TABLET | Freq: Every day | ORAL | 0 refills | Status: DC
  Filled 2023-04-17: qty 30, 30d supply, fill #0

## 2023-04-17 MED ORDER — ISOSORBIDE MONONITRATE ER 30 MG PO TB24
15.0000 mg | ORAL_TABLET | Freq: Every day | ORAL | 0 refills | Status: DC
  Filled 2023-04-17: qty 30, 60d supply, fill #0

## 2023-04-17 MED ORDER — CEFDINIR 300 MG PO CAPS
300.0000 mg | ORAL_CAPSULE | Freq: Two times a day (BID) | ORAL | 0 refills | Status: AC
  Filled 2023-04-17: qty 4, 2d supply, fill #0

## 2023-04-17 MED ORDER — ALPRAZOLAM 0.25 MG PO TABS: 0.2500 mg | ORAL_TABLET | Freq: Two times a day (BID) | ORAL | Status: DC | PRN

## 2023-04-17 MED ORDER — RANOLAZINE ER 500 MG PO TB12
500.0000 mg | ORAL_TABLET | Freq: Two times a day (BID) | ORAL | 0 refills | Status: DC
  Filled 2023-04-17: qty 60, 30d supply, fill #0

## 2023-04-17 MED ORDER — DOXYCYCLINE HYCLATE 100 MG PO TABS
100.0000 mg | ORAL_TABLET | Freq: Two times a day (BID) | ORAL | 0 refills | Status: AC
  Filled 2023-04-17: qty 4, 2d supply, fill #0

## 2023-04-17 MED ORDER — PREDNISONE 20 MG PO TABS
40.0000 mg | ORAL_TABLET | Freq: Every day | ORAL | 0 refills | Status: AC
  Filled 2023-04-17: qty 6, 3d supply, fill #0

## 2023-04-17 MED ORDER — ISOSORBIDE MONONITRATE ER 30 MG PO TB24
15.0000 mg | ORAL_TABLET | Freq: Every day | ORAL | Status: DC
  Administered 2023-04-17: 15 mg via ORAL
  Filled 2023-04-17: qty 1

## 2023-04-17 MED FILL — Verapamil HCl IV Soln 2.5 MG/ML: INTRAVENOUS | Qty: 2 | Status: AC

## 2023-04-17 NOTE — Plan of Care (Signed)

## 2023-04-17 NOTE — Progress Notes (Signed)
Referral placed to EP.

## 2023-04-17 NOTE — Progress Notes (Addendum)
Rounding Note    Patient Name: Whitney May Date of Encounter: 04/17/2023  [facility] Cardiologist: Marca Ancona, MD   Subjective   BP 102/56, Cr 0.8, K+ 5.2. Had episode of chest pain this morning.  Currently pain free  Inpatient Medications    Scheduled Meds:  arformoterol  15 mcg Nebulization BID   aspirin  81 mg Oral Daily   budesonide (PULMICORT) nebulizer solution  0.5 mg Nebulization BID   Chlorhexidine Gluconate Cloth  6 each Topical Daily   doxycycline  100 mg Oral Q12H   empagliflozin  10 mg Oral QAC breakfast   enoxaparin (LOVENOX) injection  40 mg Subcutaneous Q24H   levothyroxine  100 mcg Oral Q0600   predniSONE  40 mg Oral Q breakfast   ranolazine  500 mg Oral BID   revefenacin  175 mcg Nebulization Daily   rosuvastatin  40 mg Oral Daily   sodium chloride flush  3 mL Intravenous Q12H   sodium chloride flush  3 mL Intravenous Q12H   ticagrelor  90 mg Oral BID   Continuous Infusions:  sodium chloride     cefTRIAXone (ROCEPHIN)  IV 2 g (04/17/23 0815)   PRN Meds: sodium chloride, acetaminophen, ALPRAZolam, docusate sodium, guaiFENesin-dextromethorphan, morphine injection, nitroGLYCERIN, ondansetron (ZOFRAN) IV, oxyCODONE-acetaminophen, polyethylene glycol, sodium chloride flush   Vital Signs    Vitals:   04/16/23 1939 04/17/23 0003 04/17/23 0433 04/17/23 0800  BP: (!) 86/49 (!) 98/55 123/73 (!) 102/56  Pulse: 85 66 80 69  Resp: 20 18 19 20   Temp: 98.1 F (36.7 C) 98 F (36.7 C) 98.1 F (36.7 C) 98.1 F (36.7 C)  TempSrc: Oral Oral Oral Oral  SpO2: 96% 96% 96% 97%  Weight:      Height:        Intake/Output Summary (Last 24 hours) at 04/17/2023 0934 Last data filed at 04/17/2023 0159 Gross per 24 hour  Intake 1589.17 ml  Output --  Net 1589.17 ml       04/16/2023    5:44 AM 04/15/2023    5:00 AM 04/14/2023    3:06 AM  Last 3 Weights  Weight (lbs) 125 lb 10.6 oz 125 lb 10.6 oz 126 lb 8.7 oz  Weight (kg) 57 kg 57 kg 57.4 kg       Telemetry    Sinus rhythm - Personally Reviewed  ECG    SR HR 76, first degree HB, LBBB, left axis deviation - Personally Reviewed  Physical Exam   GEN: No acute distress.   Neck: No JVD Cardiac: RRR, no murmurs, rubs, or gallops.  Respiratory: Clear to auscultation bilaterally. GI: Soft, nontender, non-distended  MS: No edema; No deformity. Neuro:  Nonfocal  Psych: Normal affect  Groin site  Labs    High Sensitivity Troponin:   Recent Labs  Lab 04/12/23 1954 04/12/23 2122 04/13/23 0714 04/15/23 0845 04/15/23 1042  TROPONINIHS 10 14 41* 100* 111*      Chemistry Recent Labs  Lab 04/12/23 1954 04/13/23 0714 04/15/23 0202 04/16/23 0148 04/17/23 0130  NA 137   < > 136 135 135  K 3.0*   < > 4.4 4.6 5.2*  CL 105   < > 102 104 106  CO2 25   < > 21* 23 22  GLUCOSE 109*   < > 109* 93 112*  BUN 11   < > 16 17 17   CREATININE 0.88   < > 0.96 0.78 0.80  CALCIUM 8.8*   < >  9.0 8.8* 8.5*  MG 1.9   < > 1.9 2.1 1.9  PROT 6.4*  --   --   --   --   ALBUMIN 3.0*  --   --   --   --   AST 24  --   --   --   --   ALT 28  --   --   --   --   ALKPHOS 71  --   --   --   --   BILITOT 0.5  --   --   --   --   GFRNONAA >60   < > >60 >60 >60  ANIONGAP 7   < > 13 8 7    < > = values in this interval not displayed.     Lipids No results for input(s): "CHOL", "TRIG", "HDL", "LABVLDL", "LDLCALC", "CHOLHDL" in the last 168 hours.  Hematology Recent Labs  Lab 04/15/23 0202 04/16/23 0148 04/17/23 0130  WBC 18.9* 15.9* 13.2*  RBC 4.38 3.93 3.81*  HGB 12.5 11.3* 10.8*  HCT 38.5 35.1* 33.8*  MCV 87.9 89.3 88.7  MCH 28.5 28.8 28.3  MCHC 32.5 32.2 32.0  RDW 15.3 15.8* 16.2*  PLT 336 322 347    Thyroid  Recent Labs  Lab 04/16/23 0148  TSH <0.010*  FREET4 1.52*     BNP Recent Labs  Lab 04/15/23 0202  BNP 469.7*     DDimer No results for input(s): "DDIMER" in the last 168 hours.   Radiology    CARDIAC CATHETERIZATION  Result Date: 04/16/2023   Mid LM to Dist  LM lesion is 50% stenosed.   Ost RCA to Prox RCA lesion is 70% stenosed. -Denovo disease   The previously placed overlapping stents in the mid to distal RCA is 100% stenosed with 100% stenosed side branch in RPAV.  The RPL system fills via L-R collaterals   RPDA stent is 100% stenosed. The PDA fills via L-R collaterals   LV end diastolic pressure is normal.   There is no aortic valve stenosis.   Would continue Brilinta for now for the short-term to see how she does on 8 anginal medications. SUMMARY Culprit: 100% In Stent Restenosis of overlapping DES in RCA with denovo severe diffuse 70% stenosis of the proximal RCA prio to the stented segment. Collaterals fill the RPL & RPDA retrograde almost to the distal stents. Stable distal LM ~45-50% stenosis with mild diffuse LAD disease. LVEDP 10-12 mmHg RECOMMENDATIONS Images reviewed with Dr. [place].  Based on the fact that the occlusion site is in the overlapping stented segment with extensive disease upstream, recommendation for now would be medical therapy to treat angina.  If unable to control with medications, we could attempt to reopen the distal vessel and stent the proximal vessel, however this would potentially lead to reocclusion. I have added Ranexa 500 mg twice daily based on the patient's low blood pressure.  Not sure if she will be able to tolerate more aggressive therapy. As BP improves, would try to restart at a minimum carvedilol and potentially add Imdur Optimize volume status as increased LV filling pressures would lead to angina from microvascular disease Patient may benefit from CRT-D Bryan Lemma, MD   Cardiac Studies   Left heart cath 04/16/23:   Mid LM to Dist LM lesion is 50% stenosed.   Ost RCA to Prox RCA lesion is 70% stenosed. -Denovo disease   The previously placed overlapping stents in the mid to distal RCA is  100% stenosed with 100% stenosed side branch in RPAV.  The RPL system fills via L-R collaterals   RPDA stent is 100% stenosed.  The PDA fills via L-R collaterals   LV end diastolic pressure is normal.   There is no aortic valve stenosis.   Would continue Brilinta for now for the short-term to see how she does on 8 anginal medications.     SUMMARY Culprit: 100% In Stent Restenosis of overlapping DES in RCA with denovo severe diffuse 70% stenosis of the proximal RCA prio to the stented segment. Collaterals fill the RPL & RPDA retrograde almost to the distal stents. Stable distal LM ~45-50% stenosis with mild diffuse LAD disease. LVEDP 10-12 mmHg      RECOMMENDATIONS Images reviewed with Dr. [place].  Based on the fact that the occlusion site is in the overlapping stented segment with extensive disease upstream, recommendation for now would be medical therapy to treat angina.  If unable to control with medications, we could attempt to reopen the distal vessel and stent the proximal vessel, however this would potentially lead to reocclusion. I have added Ranexa 500 mg twice daily based on the patient's low blood pressure.  Not sure if she will be able to tolerate more aggressive therapy. As BP improves, would try to restart at a minimum carvedilol and potentially add Imdur Optimize volume status as increased LV filling pressures would lead to angina from microvascular disease Patient may benefit from CRT-D    Echo 04/13/23: 1. Left ventricular ejection fraction, by estimation, is 20 to 25%. The  left ventricle has severely decreased function. The left ventricle has no  regional wall motion abnormalities. The left ventricular internal cavity  size was mildly dilated.   2. Right ventricular systolic function is mildly reduced. The right  ventricular size is normal.   3. The mitral valve was not well visualized. No evidence of mitral valve  regurgitation. No evidence of mitral stenosis.   4. The aortic valve was not well visualized. Aortic valve regurgitation  is not visualized. No aortic stenosis is present.   5.  Limited echo. Color Doppler not performed.    Patient Profile     62 y.o. female with a history of CAD (NSTEMI in 07/2018 s/p PCI with DES to RCA, NSTEMI in 03/2019 due to in-stent restenosis of RCA with overlapping DES x2, and STEMI in 09/2022 s/p DES to ostial PLA/ PDA branches and PTCA of the entire previously stented RCA), ischemic cardiomyopathy/ chronic HFrEF of 25-30% with global hypokinesis, PAD with long occlusion of left SFA, hyperlipidemia, hypothyroidism, and tobacco abuse who is being seen 04/13/2023 for the evaluation of chest pain   Assessment & Plan    Chest pain Hx of CAD with prior PCI NSTEMI 07/2018 DES-RCA NSTEMI 03/2019 ISR tx with DESx2-RCA STEMI 09/2022 tx DES-PLA/PDA, PTCA to RCA stents NSTEMI 04/2023 with ISR in distal RCA and new stenosis in proximal RCA - medical therapy recommended  - started on 500 mg ranexa BID - continue ASA and brilinta - resumed coreg - losartan and spiro on hold - episode of chest pain this AM, will add low dose imdur   Chronic combined heart failure Ischemic cardiomyopathy Echo this admission with LVEF 20-25%, down from echo 01/2023 read as LVEF 25-30% GDMT difficult due to hypotension 2/2 nitro use Will also need to manage in the setting of anti-anginal regimen - added back coreg  3.125 mg - continue jardiance - spiro and losartan on hold due to soft  BP and mild hyperkalemia this morning.  Can add back as outpatient if stable BP, labs   Need for CRT-D - given EF and CAD, pt previously wore a lifevest, no longer wearing - planned for CRT-D - plan referral to EP for CRT-D   Hypotension Likely due to nitro prior to arrival - will add back BB tonight, BP too soft now   Hyperlipidemia with LDL goal < 70 On crestor 40 mg Lpa 9.1 LDL was 16   Current smoker Strongly encouraged cessation  [facility] will sign off.   Medication Recommendations:  ASA 81, ticagrelor 90 mg BID, coreg 3.125 mg BID, jardiance 10 mg daily,  rosuvastatin 40 mg daily, ranexa 500 mg BID, imdur 15 mg daily, as needed SL NTG.  Hold spironolactone and losartan for now Other recommendations (labs, testing, etc):  BMET in 1 week.  EP referral for CRT eval Follow up as an outpatient:  Scheduled for 6/18      For questions or updates, please contact [facility] Please consult [email or web address] for contact info under        Signed, Little Ishikawa, MD  04/17/2023, 9:34 AM

## 2023-04-17 NOTE — Discharge Summary (Signed)
Physician Discharge Summary  Whitney May GNF:[id number] DOB: 1961/04/18 DOA: 04/12/2023  PCP: Shelby Dubin, FNP  Admit date: 04/12/2023 Discharge date: 04/17/2023 Recommendations for Outpatient Follow-up:  Follow up with PCP in 1 weeks-call for appointment Please obtain BMP/CBC in one week  Discharge Dispo: home Discharge Condition: Stable Code Status:   Code Status: Full Code Diet recommendation:  Diet Order             Diet renal with fluid restriction Fluid restriction: 1500 mL Fluid; Room service appropriate? Yes; Fluid consistency: Thin  Diet effective now                    Brief/Interim Summary: 62 year old female with CAD s/p NSTEMI in 2020 and STEMI in 2023 on DAPT, hyperlipidemia, HFrEF, and tobacco abuse admitted to on 6/6 6 for continued chest pain despite taking nitroglycerin 3 times. She had been dizzy and noted to be hypotensive, started on Levophed. She was transferred to: ICU due to pressor requirements. Blood pressure improved and she was transferred to the floor in 04/14/2023. S/P cardiac cath 6//10>with ISR in distal RCA and new stenosis in proximal RCA - medical therapy recommended ,, CRT device advised which will be for outpatient Being followed by cardiology> GDMT adjusted to continue on Coreg, aspirin and Brilinta, Ranexa, Jardiance, Crestor Imdur and as needed sublingual nitroglycerin holding losartan and Aldactone due to mild hyperkalemia.  Informed by cardiology this morning that okay to discharge home with close follow-up with cardiology scheduled for 6/15    Discharge Diagnoses:  Principal Problem:   Non-ST elevation (NSTEMI) myocardial infarction [facility]) Active Problems:   Hypothyroidism   Hyperlipidemia, mixed   Hypotension   Hypotension, iatrogenic   Coronary artery disease involving native coronary artery with angina pectoris (HCC)   Chronic combined systolic and diastolic heart failure (HCC)   NSTEMI Chest pain Hx of CAD w. Prior PCI: [facility]  6/10> ISR in distal RCA and new stenosis in pRCA-medical therapy advised, cardio on board, cont asa, brilinta, coreg, Imdur, Ranexa hold losartan and Aldactone.  Follow-up with cardiology as outpatient.  Chronic, mild heart failure ICM: Need for CRTD which will be outpatient continue GDMT: Losartan Aldactone Lasix Follow-up with PCP and cardiology   Community-acquired pneumonia continue omnicef, doxycycline bronchodilators antitussives, prednisone x to complete the course   Hypothyroidism: ON Levothyroxine 137 mcg daily-TSH suppressed less than 0.01 Free T4 elevated 1.5, decreased the dose of levothyroxine-follow-up with PCP   Hyperlipidemia, mixed: cont Crestor  Tobacco abuse cessation advise prior to d/c.  Follow-up with lung cancer screening protocol  Consults: Cardiology Subjective: Alert awake oriented resting comfortably no new complaints.  Discharge Exam: Vitals:   04/17/23 0433 04/17/23 0800  BP: 123/73 (!) 102/56  Pulse: 80 69  Resp: 19 20  Temp: 98.1 F (36.7 C) 98.1 F (36.7 C)  SpO2: 96% 97%   General: Pt is alert, awake, not in acute distress Cardiovascular: RRR, S1/S2 +, no rubs, no gallops Respiratory: CTA bilaterally, no wheezing, no rhonchi Abdominal: Soft, NT, ND, bowel sounds + Extremities: no edema, no cyanosis  Discharge Instructions  Discharge Instructions     Ambulatory Referral for Lung Cancer Scre   Complete by: As directed    Discharge instructions   Complete by: As directed    Continue low potassium diet.  Follow-up with primary care doctor for repeat BMP in 3 to 4 days, follow-up with cardiology. Please call call MD or return to ER for similar or worsening recurring problem  that brought you to hospital or if any fever,nausea/vomiting,abdominal pain, uncontrolled pain, chest pain,  shortness of breath or any other alarming symptoms.  Please follow-up your doctor as instructed in a week time and call the office for appointment.  Please avoid  alcohol, smoking, or any other illicit substance and maintain healthy habits including taking your regular medications as prescribed.  You were cared for by a hospitalist during your hospital stay. If you have any questions about your discharge medications or the care you received while you were in the hospital after you are discharged, you can call the unit and ask to speak with the hospitalist on call if the hospitalist that took care of you is not available.  Once you are discharged, your primary care physician will handle any further medical issues. Please note that NO REFILLS for any discharge medications will be authorized once you are discharged, as it is imperative that you return to your primary care physician (or establish a relationship with a primary care physician if you do not have one) for your aftercare needs so that they can reassess your need for medications and monitor your lab values   Increase activity slowly   Complete by: As directed       Allergies as of 04/17/2023       Reactions   Penicillins Other (See Comments)   Fever, vomiting Has patient had a PCN reaction causing immediate rash, facial/tongue/throat swelling, SOB or lightheadedness with hypotension: NO Has patient had a PCN reaction causing severe rash involving mucus membranes or skin necrosis: NO Has patient had a PCN reaction that required hospitalization: NO Has patient had a PCN reaction occurring within the last 10 years: NO If all of the above answers are "NO", then may proceed Childhood allergy        Medication List     STOP taking these medications    azithromycin 250 MG tablet Commonly known as: ZITHROMAX   furosemide 20 MG tablet Commonly known as: LASIX   losartan 25 MG tablet Commonly known as: COZAAR   nitrofurantoin (macrocrystal-monohydrate) 100 MG capsule Commonly known as: MACROBID   potassium chloride SA 20 MEQ tablet Commonly known as: KLOR-CON M   spironolactone 25 MG  tablet Commonly known as: ALDACTONE       TAKE these medications    aspirin 81 MG chewable tablet Chew 1 tablet (81 mg total) by mouth daily.   benzonatate 100 MG capsule Commonly known as: TESSALON Take 100 mg by mouth every 8 (eight) hours.   carvedilol 3.125 MG tablet Commonly known as: COREG Take 1 tablet (3.125 mg total) by mouth 2 (two) times daily.   cefdinir 300 MG capsule Commonly known as: OMNICEF Take 1 capsule (300 mg total) by mouth 2 (two) times daily for 2 days.   doxycycline 100 MG tablet Commonly known as: VIBRA-TABS Take 1 tablet (100 mg total) by mouth every 12 (twelve) hours for 2 days.   Fish Oil 1200 MG Caps Take 1,200 mg by mouth daily.   isosorbide mononitrate 30 MG 24 hr tablet Commonly known as: IMDUR Take 0.5 tablets (15 mg total) by mouth daily.   Jardiance 10 MG Tabs tablet Generic drug: empagliflozin Take 1 tablet (10 mg total) by mouth daily before breakfast.   levothyroxine 100 MCG tablet Commonly known as: SYNTHROID Take 1 tablet (100 mcg total) by mouth daily at 6 (six) AM. Start taking on: April 18, 2023 What changed:  medication strength how much to take  when to take this   multivitamin with minerals Tabs tablet Take 1 tablet by mouth daily.   nitroGLYCERIN 0.4 MG SL tablet Commonly known as: NITROSTAT Place 1 tablet (0.4 mg total) under the tongue every 5 (five) minutes x 3 doses as needed for chest pain.   predniSONE 20 MG tablet Commonly known as: DELTASONE Take 2 tablets (40 mg total) by mouth daily with breakfast for 3 days. Start taking on: April 18, 2023   ranolazine 500 MG 12 hr tablet Commonly known as: RANEXA Take 1 tablet (500 mg total) by mouth 2 (two) times daily.   rosuvastatin 40 MG tablet Commonly known as: CRESTOR Take 1 tablet (40 mg total) by mouth daily.   ticagrelor 90 MG Tabs tablet Commonly known as: BRILINTA Take 1 tablet (90 mg total) by mouth 2 (two) times daily.        Follow-up  Information     Bucio, Elsa C, FNP Follow up in 3 day(s).   Specialty: Family Medicine Why: To check BMP Contact information: [address] [place] [address] [phone]                Allergies  Allergen Reactions   Penicillins Other (See Comments)    Fever, vomiting Has patient had a PCN reaction causing immediate rash, facial/tongue/throat swelling, SOB or lightheadedness with hypotension: NO Has patient had a PCN reaction causing severe rash involving mucus membranes or skin necrosis: NO Has patient had a PCN reaction that required hospitalization: NO Has patient had a PCN reaction occurring within the last 10 years: NO If all of the above answers are "NO", then may proceed Childhood allergy    The results of significant diagnostics from this hospitalization (including imaging, microbiology, ancillary and laboratory) are listed below for reference.    Microbiology: Recent Results (from the past 240 hour(s))  MRSA Next Gen by PCR, Nasal     Status: None   Collection Time: 04/13/23 12:40 AM   Specimen: Nasal Mucosa; Nasal Swab  Result Value Ref Range Status   MRSA by PCR Next Gen NOT DETECTED NOT DETECTED Final    Comment: (NOTE) The GeneXpert MRSA Assay (FDA approved for NASAL specimens only), is one component of a comprehensive MRSA colonization surveillance program. It is not intended to diagnose MRSA infection nor to guide or monitor treatment for MRSA infections. Test performance is not FDA approved in patients less than 19 years old. Performed at [facility], [address], [place], [place] [address]   Expectorated Sputum Assessment w Gram Stain, Rflx to Resp Cult     Status: None   Collection Time: 04/13/23 10:36 AM   Specimen: Sputum  Result Value Ref Range Status   Specimen Description SPUTUM  Final   Special Requests NONE  Final   Sputum evaluation   Final    THIS SPECIMEN IS ACCEPTABLE FOR SPUTUM CULTURE Performed at [facility]  Lab, [address], [place], [place] [address]    Report Status 04/13/2023 FINAL  Final  Culture, Respiratory w Gram Stain     Status: None   Collection Time: 04/13/23 10:36 AM   Specimen: SPU  Result Value Ref Range Status   Specimen Description SPUTUM  Final   Special Requests NONE Reflexed from [id number]  Final   Gram Stain   Final    FEW SQUAMOUS EPITHELIAL CELLS PRESENT ABUNDANT GRAM NEGATIVE RODS MODERATE GRAM POSITIVE COCCI FEW WBC PRESENT,BOTH PMN AND MONONUCLEAR  Culture   Final    MODERATE HAEMOPHILUS INFLUENZAE BETA LACTAMASE NEGATIVE Performed at [facility], [address], [place], [place] [address]    Report Status 04/16/2023 FINAL  Final  Culture, blood (Routine X 2) w Reflex to ID Panel     Status: None (Preliminary result)   Collection Time: 04/13/23  1:41 PM   Specimen: BLOOD LEFT HAND  Result Value Ref Range Status   Specimen Description BLOOD LEFT HAND  Final   Special Requests   Final    BOTTLES DRAWN AEROBIC AND ANAEROBIC Blood Culture adequate volume   Culture   Final    NO GROWTH 4 DAYS Performed at [facility], [address], [place], [place] [address]    Report Status PENDING  Incomplete  Culture, blood (Routine X 2) w Reflex to ID Panel     Status: None (Preliminary result)   Collection Time: 04/13/23  1:41 PM   Specimen: BLOOD RIGHT HAND  Result Value Ref Range Status   Specimen Description BLOOD RIGHT HAND  Final   Special Requests   Final    BOTTLES DRAWN AEROBIC AND ANAEROBIC Blood Culture adequate volume   Culture   Final    NO GROWTH 4 DAYS Performed at [facility], [address] [place], [place] [address]    Report Status PENDING  Incomplete    Procedures/Studies: CARDIAC CATHETERIZATION  Result Date: 04/16/2023   Mid LM to Dist LM lesion is 50% stenosed.   Ost RCA to Prox RCA lesion is 70% stenosed. -Denovo disease   The previously placed overlapping stents in the mid to distal RCA is 100% stenosed with 100%  stenosed side branch in RPAV.  The RPL system fills via L-R collaterals   RPDA stent is 100% stenosed. The PDA fills via L-R collaterals   LV end diastolic pressure is normal.   There is no aortic valve stenosis.   Would continue Brilinta for now for the short-term to see how she does on 8 anginal medications. SUMMARY Culprit: 100% In Stent Restenosis of overlapping DES in RCA with denovo severe diffuse 70% stenosis of the proximal RCA prio to the stented segment. Collaterals fill the RPL & RPDA retrograde almost to the distal stents. Stable distal LM ~45-50% stenosis with mild diffuse LAD disease. LVEDP 10-12 mmHg RECOMMENDATIONS Images reviewed with Dr. [place].  Based on the fact that the occlusion site is in the overlapping stented segment with extensive disease upstream, recommendation for now would be medical therapy to treat angina.  If unable to control with medications, we could attempt to reopen the distal vessel and stent the proximal vessel, however this would potentially lead to reocclusion. I have added Ranexa 500 mg twice daily based on the patient's low blood pressure.  Not sure if she will be able to tolerate more aggressive therapy. As BP improves, would try to restart at a minimum carvedilol and potentially add Imdur Optimize volume status as increased LV filling pressures would lead to angina from microvascular disease Patient may benefit from CRT-D Bryan Lemma, MD  VAS [place] LOWER EXTREMITY VENOUS (DVT)  Result Date: 04/14/2023  Lower Venous DVT Study Patient Name:  Whitney May  Date of Exam:   04/13/2023 Medical Rec #: [id number]       Accession #:    [id number] Date of Birth: [birth year]       Patient Gender: F Patient Age:   89 years Exam Location:  [facility]  Procedure:      VAS [place] LOWER EXTREMITY VENOUS (DVT) Referring Phys: Levon Hedger --------------------------------------------------------------------------------  Indications: Swelling.  Comparison Study: No priors. Performing  Technologist: Marilynne Halsted RDMS, RVT  Examination Guidelines: A complete evaluation includes B-mode imaging, spectral Doppler, color Doppler, and power Doppler as needed of all accessible portions of each vessel. Bilateral testing is considered an integral part of a complete examination. Limited examinations for reoccurring indications may be performed as noted. The reflux portion of the exam is performed with the patient in reverse Trendelenburg.  +---------+---------------+---------+-----------+----------+--------------+ RIGHT    CompressibilityPhasicitySpontaneityPropertiesThrombus Aging +---------+---------------+---------+-----------+----------+--------------+ CFV      Partial        Yes      Yes                                 +---------+---------------+---------+-----------+----------+--------------+ SFJ      Full                                                        +---------+---------------+---------+-----------+----------+--------------+ FV Prox  Full                                                        +---------+---------------+---------+-----------+----------+--------------+ FV Mid   Full                                                        +---------+---------------+---------+-----------+----------+--------------+ FV DistalFull                                                        +---------+---------------+---------+-----------+----------+--------------+ PFV      Full                                                        +---------+---------------+---------+-----------+----------+--------------+ POP      Full           Yes      Yes                                 +---------+---------------+---------+-----------+----------+--------------+ PTV      Full                                                        +---------+---------------+---------+-----------+----------+--------------+ PERO     Full                                                         +---------+---------------+---------+-----------+----------+--------------+   +---------+---------------+---------+-----------+----------+--------------+  LEFT     CompressibilityPhasicitySpontaneityPropertiesThrombus Aging +---------+---------------+---------+-----------+----------+--------------+ CFV      Full           Yes      Yes                                 +---------+---------------+---------+-----------+----------+--------------+ SFJ      Full                                                        +---------+---------------+---------+-----------+----------+--------------+ FV Prox  Full                                                        +---------+---------------+---------+-----------+----------+--------------+ FV Mid   Full                                                        +---------+---------------+---------+-----------+----------+--------------+ FV DistalFull                                                        +---------+---------------+---------+-----------+----------+--------------+ PFV      Full                                                        +---------+---------------+---------+-----------+----------+--------------+ POP      Full           Yes      Yes                                 +---------+---------------+---------+-----------+----------+--------------+ PTV      Full                                                        +---------+---------------+---------+-----------+----------+--------------+ PERO     Full                                                        +---------+---------------+---------+-----------+----------+--------------+     Summary: BILATERAL: - No evidence of deep vein thrombosis seen in the lower extremities, bilaterally. -No evidence of popliteal cyst, bilaterally.   *See table(s) above for measurements and observations. Electronically signed by Gerarda Fraction on  04/14/2023 at 9:35:32 AM.  Final    ECHOCARDIOGRAM LIMITED  Result Date: 04/13/2023    ECHOCARDIOGRAM LIMITED REPORT   Patient Name:   Whitney May Date of Exam: 04/13/2023 Medical Rec #:  [id number]      Height:       62.0 in Accession #:    [id number]     Weight:       127.9 lb Date of Birth:  06-15-1961      BSA:          1.581 m Patient Age:    62 years       BP:           94/51 mmHg Patient Gender: F              HR:           65 bpm. Exam Location:  Inpatient Procedure: Limited Echo Indications:    Nstemi  History:        Patient has prior history of Echocardiogram examinations, most                 recent 01/08/2023. Cardiomyopathy; Risk Factors:Current Smoker.  Sonographer:    Darlys Gales Referring Phys: [id number] Vinnie Level SMITH IMPRESSIONS  1. Left ventricular ejection fraction, by estimation, is 20 to 25%. The left ventricle has severely decreased function. The left ventricle has no regional wall motion abnormalities. The left ventricular internal cavity size was mildly dilated.  2. Right ventricular systolic function is mildly reduced. The right ventricular size is normal.  3. The mitral valve was not well visualized. No evidence of mitral valve regurgitation. No evidence of mitral stenosis.  4. The aortic valve was not well visualized. Aortic valve regurgitation is not visualized. No aortic stenosis is present.  5. Limited echo. Color Doppler not performed. FINDINGS  Left Ventricle: Left ventricular ejection fraction, by estimation, is 20 to 25%. The left ventricle has severely decreased function. The left ventricle has no regional wall motion abnormalities. The left ventricular internal cavity size was mildly dilated. There is no left ventricular hypertrophy. Right Ventricle: The right ventricular size is normal. No increase in right ventricular wall thickness. Right ventricular systolic function is mildly reduced. Left Atrium: Left atrial size was normal in size. Right Atrium: Right atrial size was  normal in size. Pericardium: There is no evidence of pericardial effusion. Mitral Valve: The mitral valve was not well visualized. No evidence of mitral valve stenosis. Tricuspid Valve: The tricuspid valve is not well visualized. Tricuspid valve regurgitation is not demonstrated. No evidence of tricuspid stenosis. Aortic Valve: The aortic valve was not well visualized. Aortic valve regurgitation is not visualized. No aortic stenosis is present. Pulmonic Valve: The pulmonic valve was not assessed. Pulmonic valve regurgitation is not visualized. No evidence of pulmonic stenosis. Aorta: Aortic root could not be assessed. Venous: The inferior vena cava was not well visualized. IAS/Shunts: No atrial level shunt detected by color flow Doppler. Additional Comments: A device lead is visualized.  LEFT VENTRICLE PLAX 2D LVIDd:         5.70 cm LVIDs:         5.20 cm LV PW:         1.10 cm LV IVS:        0.70 cm LVOT diam:     1.80 cm LVOT Area:     2.54 cm  IVC IVC diam: 1.60 cm LEFT ATRIUM             Index  RIGHT ATRIUM          Index LA Vol (A2C):   47.8 ml 30.24 ml/m  RA Area:     9.98 cm LA Vol (A4C):   25.2 ml 15.94 ml/m  RA Volume:   20.50 ml 12.97 ml/m LA Biplane Vol: 36.0 ml 22.77 ml/m   AORTA Ao Root diam: 3.20 cm  SHUNTS Systemic Diam: 1.80 cm Arvilla Meres MD Electronically signed by Arvilla Meres MD Signature Date/Time: 04/13/2023/4:12:06 PM    Final    CT Angio Chest/Abd/Pel for Dissection W and/or Wo Contrast  Result Date: 04/12/2023 CLINICAL DATA:  Chest pain for 1 hour EXAM: CT ANGIOGRAPHY CHEST, ABDOMEN AND PELVIS TECHNIQUE: Non-contrast CT of the chest was initially obtained. Multidetector CT imaging through the chest, abdomen and pelvis was performed using the standard protocol during bolus administration of intravenous contrast. Multiplanar reconstructed images and MIPs were obtained and reviewed to evaluate the vascular anatomy. RADIATION DOSE REDUCTION: This exam was performed  according to the departmental dose-optimization program which includes automated exposure control, adjustment of the mA and/or kV according to patient size and/or use of iterative reconstruction technique. CONTRAST:  OMNIPAQUE IOHEXOL 350 MG/ML SOLN COMPARISON:  Plain film from earlier in the same day. FINDINGS: CTA CHEST FINDINGS Cardiovascular: Initial precontrast images show no hyperdense crescent to suggest acute aortic injury. The thoracic aorta demonstrates atherosclerotic calcifications. No aneurysmal dilatation or dissection is noted. No cardiac enlargement is seen. The pulmonary artery is within normal limits although not timed for embolus evaluation. No significant coronary calcifications are noted. Mediastinum/Nodes: Thoracic inlet is within normal limits. No hilar or mediastinal adenopathy is noted. The esophagus is within normal limits. Lungs/Pleura: Mild emphysematous changes are noted. The lungs are otherwise well aerated bilaterally. No focal infiltrate or effusion is seen. No parenchymal nodules are noted. Musculoskeletal: No chest wall abnormality. No acute or significant osseous findings. Review of the MIP images confirms the above findings. CTA ABDOMEN AND PELVIS FINDINGS VASCULAR Aorta: Atherosclerotic calcifications are noted without aneurysmal dilatation or dissection. Celiac: Patent without evidence of aneurysm, dissection, vasculitis or significant stenosis. SMA: Patent without evidence of aneurysm, dissection, vasculitis or significant stenosis. Renals: Both renal arteries are patent without evidence of aneurysm, dissection, vasculitis, fibromuscular dysplasia or significant stenosis. IMA: Patent without evidence of aneurysm, dissection, vasculitis or significant stenosis. Inflow: Iliacs demonstrate atherosclerotic calcifications without aneurysmal dilatation or dissection. Veins: No specific venous abnormality is noted. Review of the MIP images confirms the above findings.  NON-VASCULAR Hepatobiliary: No focal liver abnormality is seen. No gallstones, gallbladder wall thickening, or biliary dilatation. Pancreas: Unremarkable. No pancreatic ductal dilatation or surrounding inflammatory changes. Spleen: Normal in size without focal abnormality. Adrenals/Urinary Tract: Adrenal glands are within normal limits. Kidneys demonstrate a normal enhancement pattern bilaterally. No renal calculi or obstructive changes are seen. The bladder is within normal limits. Stomach/Bowel: The appendix is not discretely visualized. No inflammatory changes to suggest appendicitis are noted. No obstructive or inflammatory changes of the colon are seen. Stomach and small bowel are within normal limits. Lymphatic: No significant lymphadenopathy is noted. Reproductive: Uterus and bilateral adnexa are unremarkable. Other: No abdominal wall hernia or abnormality. No abdominopelvic ascites. Musculoskeletal: No acute or significant osseous findings. Review of the MIP images confirms the above findings. IMPRESSION: CTA of the chest: No evidence of aortic abnormality. No pulmonary emboli are seen. CTA of the abdomen and pelvis: No aortic abnormality is noted. No acute abnormality is seen. Aortic Atherosclerosis (ICD10-I70.0) and Emphysema (ICD10-J43.9). Electronically Signed  By: Alcide Clever M.D.   On: 04/12/2023 21:07   DG Chest 2 View  Result Date: 04/12/2023 CLINICAL DATA:  Chest pain EXAM: CHEST - 2 VIEW COMPARISON:  04/07/2023 FINDINGS: Cardiac shadow is stable. The lungs are well aerated bilaterally. No focal infiltrate or effusion is seen. No bony abnormality is noted. IMPRESSION: No active cardiopulmonary disease. Electronically Signed   By: Alcide Clever M.D.   On: 04/12/2023 20:38    Labs: BNP (last 3 results) Recent Labs    09/18/22 1843 01/08/23 1411 04/15/23 0202  BNP 439.3* 166.5* 469.7*   Basic Metabolic Panel: Recent Labs  Lab 04/12/23 1954 04/13/23 0714 04/14/23 0230 04/15/23 0202  04/16/23 0148 04/17/23 0130  NA 137 137 134* 136 135 135  K 3.0* 3.7 4.5 4.4 4.6 5.2*  CL 105 103 105 102 104 106  CO2 25 22 17* 21* 23 22  GLUCOSE 109* 100* 114* 109* 93 112*  BUN 11 11 13 16 17 17   CREATININE 0.88 0.86 0.96 0.96 0.78 0.80  CALCIUM 8.8* 9.2 8.9 9.0 8.8* 8.5*  MG 1.9 1.8  --  1.9 2.1 1.9  PHOS  --  3.7  --   --   --   --    Liver Function Tests: Recent Labs  Lab 04/12/23 1954  AST 24  ALT 28  ALKPHOS 71  BILITOT 0.5  PROT 6.4*  ALBUMIN 3.0*   No results for input(s): "LIPASE", "AMYLASE" in the last 168 hours. No results for input(s): "AMMONIA" in the last 168 hours. CBC: Recent Labs  Lab 04/13/23 1339 04/14/23 0230 04/15/23 0202 04/16/23 0148 04/17/23 0130  WBC 14.8* 13.1* 18.9* 15.9* 13.2*  NEUTROABS 10.0*  --   --   --   --   HGB 12.8 13.1 12.5 11.3* 10.8*  HCT 39.5 40.7 38.5 35.1* 33.8*  MCV 85.7 87.3 87.9 89.3 88.7  PLT 383 324 336 322 347   Cardiac Enzymes: No results for input(s): "CKTOTAL", "CKMB", "CKMBINDEX", "TROPONINI" in the last 168 hours. BNP: Invalid input(s): "POCBNP" CBG: Recent Labs  Lab 04/14/23 1838  GLUCAP 219*   D-Dimer No results for input(s): "DDIMER" in the last 72 hours. Hgb A1c No results for input(s): "HGBA1C" in the last 72 hours. Lipid Profile No results for input(s): "CHOL", "HDL", "LDLCALC", "TRIG", "CHOLHDL", "LDLDIRECT" in the last 72 hours. Thyroid function studies Recent Labs    04/16/23 0148  TSH <0.010*   Anemia work up No results for input(s): "VITAMINB12", "FOLATE", "FERRITIN", "TIBC", "IRON", "RETICCTPCT" in the last 72 hours. Urinalysis    Component Value Date/Time   COLORURINE YELLOW 04/13/2023 1036   APPEARANCEUR CLEAR 04/13/2023 1036   LABSPEC 1.015 04/13/2023 1036   PHURINE 5.0 04/13/2023 1036   GLUCOSEU 150 (A) 04/13/2023 1036   HGBUR SMALL (A) 04/13/2023 1036   BILIRUBINUR NEGATIVE 04/13/2023 1036   KETONESUR NEGATIVE 04/13/2023 1036   PROTEINUR NEGATIVE 04/13/2023 1036    NITRITE NEGATIVE 04/13/2023 1036   LEUKOCYTESUR TRACE (A) 04/13/2023 1036   Sepsis Labs Recent Labs  Lab 04/14/23 0230 04/15/23 0202 04/16/23 0148 04/17/23 0130  WBC 13.1* 18.9* 15.9* 13.2*   Microbiology Recent Results (from the past 240 hour(s))  MRSA Next Gen by PCR, Nasal     Status: None   Collection Time: 04/13/23 12:40 AM   Specimen: Nasal Mucosa; Nasal Swab  Result Value Ref Range Status   MRSA by PCR Next Gen NOT DETECTED NOT DETECTED Final    Comment: (NOTE) The GeneXpert MRSA Assay (FDA  approved for NASAL specimens only), is one component of a comprehensive MRSA colonization surveillance program. It is not intended to diagnose MRSA infection nor to guide or monitor treatment for MRSA infections. Test performance is not FDA approved in patients less than 46 years old. Performed at [facility], [address] [place], [place] [address]   Expectorated Sputum Assessment w Gram Stain, Rflx to Resp Cult     Status: None   Collection Time: 04/13/23 10:36 AM   Specimen: Sputum  Result Value Ref Range Status   Specimen Description SPUTUM  Final   Special Requests NONE  Final   Sputum evaluation   Final    THIS SPECIMEN IS ACCEPTABLE FOR SPUTUM CULTURE Performed at [facility], [address] [place], [place] [address]    Report Status 04/13/2023 FINAL  Final  Culture, Respiratory w Gram Stain     Status: None   Collection Time: 04/13/23 10:36 AM   Specimen: SPU  Result Value Ref Range Status   Specimen Description SPUTUM  Final   Special Requests NONE Reflexed from [id number]  Final   Gram Stain   Final    FEW SQUAMOUS EPITHELIAL CELLS PRESENT ABUNDANT GRAM NEGATIVE RODS MODERATE GRAM POSITIVE COCCI FEW WBC PRESENT,BOTH PMN AND MONONUCLEAR    Culture   Final    MODERATE HAEMOPHILUS INFLUENZAE BETA LACTAMASE NEGATIVE Performed at [facility], [address], [place], [place] [address]    Report Status 04/16/2023 FINAL  Final  Culture, blood  (Routine X 2) w Reflex to ID Panel     Status: None (Preliminary result)   Collection Time: 04/13/23  1:41 PM   Specimen: BLOOD LEFT HAND  Result Value Ref Range Status   Specimen Description BLOOD LEFT HAND  Final   Special Requests   Final    BOTTLES DRAWN AEROBIC AND ANAEROBIC Blood Culture adequate volume   Culture   Final    NO GROWTH 4 DAYS Performed at [facility], [address] [place], [place] [address]    Report Status PENDING  Incomplete  Culture, blood (Routine X 2) w Reflex to ID Panel     Status: None (Preliminary result)   Collection Time: 04/13/23  1:41 PM   Specimen: BLOOD RIGHT HAND  Result Value Ref Range Status   Specimen Description BLOOD RIGHT HAND  Final   Special Requests   Final    BOTTLES DRAWN AEROBIC AND ANAEROBIC Blood Culture adequate volume   Culture   Final    NO GROWTH 4 DAYS Performed at [facility], [address] [place], [place] [address]    Report Status PENDING  Incomplete     Time coordinating discharge: 25 minutes  SIGNED: Lanae Boast, MD  [facility] 04/17/2023, 10:42 AM  If 7PM-7AM, please contact night-coverage [email or web address]

## 2023-04-17 NOTE — Plan of Care (Signed)
  Problem: Education: Goal: Knowledge of General Education information will improve Description: Including pain rating scale, medication(s)/side effects and non-pharmacologic comfort measures Outcome: Adequate for Discharge   Problem: Health Behavior/Discharge Planning: Goal: Ability to manage health-related needs will improve Outcome: Adequate for Discharge   Problem: Clinical Measurements: Goal: Ability to maintain clinical measurements within normal limits will improve Outcome: Adequate for Discharge Goal: Will remain free from infection Outcome: Adequate for Discharge Goal: Diagnostic test results will improve Outcome: Adequate for Discharge Goal: Respiratory complications will improve Outcome: Adequate for Discharge Goal: Cardiovascular complication will be avoided Outcome: Adequate for Discharge   Problem: Cardiovascular: Goal: Ability to achieve and maintain adequate cardiovascular perfusion will improve Outcome: Adequate for Discharge Goal: Vascular access site(s) Level 0-1 will be maintained Outcome: Adequate for Discharge   Problem: Activity: Goal: Ability to return to baseline activity level will improve Outcome: Adequate for Discharge

## 2023-04-17 NOTE — Progress Notes (Signed)
Explained discharge instructions to patient. Reviewed follow up appointment and next medication administration times. Also reviewed education. Patient verbalized having an understanding for instructions given. All belongings are in the patient's possession to include TOC meds. IV and telemetry were removed. CCMD was notified. No other needs verbalized. Transported downstairs for discharge. 

## 2023-04-18 LAB — CULTURE, BLOOD (ROUTINE X 2)
Culture: NO GROWTH
Culture: NO GROWTH

## 2023-04-24 ENCOUNTER — Encounter (HOSPITAL_COMMUNITY): Payer: Medicaid Other

## 2023-05-21 ENCOUNTER — Inpatient Hospital Stay: Payer: Medicaid Other | Admitting: Pulmonary Disease

## 2023-05-22 ENCOUNTER — Ambulatory Visit: Payer: Medicaid Other | Attending: Cardiovascular Disease | Admitting: Cardiovascular Disease

## 2023-05-22 ENCOUNTER — Encounter: Payer: Self-pay | Admitting: Cardiovascular Disease

## 2023-05-25 ENCOUNTER — Other Ambulatory Visit (HOSPITAL_COMMUNITY): Payer: Self-pay

## 2023-05-28 ENCOUNTER — Telehealth (HOSPITAL_COMMUNITY): Payer: Self-pay | Admitting: Cardiology

## 2023-05-28 ENCOUNTER — Other Ambulatory Visit (HOSPITAL_COMMUNITY): Payer: Self-pay

## 2023-05-28 DIAGNOSIS — I5022 Chronic systolic (congestive) heart failure: Secondary | ICD-10-CM

## 2023-05-28 DIAGNOSIS — I2510 Atherosclerotic heart disease of native coronary artery without angina pectoris: Secondary | ICD-10-CM

## 2023-05-28 MED ORDER — ROSUVASTATIN CALCIUM 40 MG PO TABS
40.0000 mg | ORAL_TABLET | Freq: Every day | ORAL | 0 refills | Status: DC
Start: 2023-05-28 — End: 2023-06-08

## 2023-05-28 MED ORDER — CARVEDILOL 3.125 MG PO TABS
3.1250 mg | ORAL_TABLET | Freq: Two times a day (BID) | ORAL | 0 refills | Status: DC
Start: 2023-05-28 — End: 2023-06-08

## 2023-05-28 MED ORDER — ISOSORBIDE MONONITRATE ER 30 MG PO TB24: 15.0000 mg | ORAL_TABLET | Freq: Every day | ORAL | 0 refills | Status: DC

## 2023-05-28 MED ORDER — RANOLAZINE ER 500 MG PO TB12: 500.0000 mg | ORAL_TABLET | Freq: Two times a day (BID) | ORAL | 0 refills | Status: DC

## 2023-05-28 NOTE — Telephone Encounter (Signed)
Patient called to request refills.

## 2023-06-05 NOTE — Progress Notes (Signed)
[facility]  NOTE  PCP: Shelby Dubin, FNP HF Cardiologist: Dr. Shirlee Latch  HPI: [name] is a 62 y.o. female with hx of CAD with NSTEMI in 2019 with stent to RCA and then in 2020 NSTEMI with plaque rupture of RCA in-stent with 2 overlying stents to RCA, hypothyroidism, HLD, PAD, tobacco use, ETOH and ischemic cardiomyopathy EF 30-35% on 5/20 echo who is being seen 09/18/2022 for the evaluation of STEMI with ischemic cardiomyopathy. She has a history of PAD with long occlusion left SFA.    Admitted 11/23 with CP, ran out of Plavix for several days. EKG showed LBBB and prominent inferior ST elevation. She had emergent cath for inferior STEMI with 90% ISR mid-distal RCA, 99% stenosis ostial PDA and 95% stenosis ostial PLV. Patient had PTCA to the long mid-distal RCA stented segment and DES to ostial PLV and PDA.  LV-gram showed EF in 20% range. Echo 09/19/22 showed EF 20-25%, mod eccentric LVH, severe hypokinesis of the LV entire lateral wall. Normal RV function and size. Mild MR, trivial TR. GDMT titrated and discharged home with LifeVest, weight 110 lbs.  Echo 3/24 showed EF 25-30% with septal-lateral dyssynchrony, mild RV dysfunction. No longer wearing LifeVest. Awaiting Medicaid so she can get coverage for CRT-D.  Admitted 6/24 with NSTEMI. Transiently required NE for hypotension. Ltd Echo showed EF 20-25%, RV mildly reduced. LHC showed ISR in distal RCA, and new stenosis in proximal RCA, planned medical management. Ranexa started. Cleda Daub and losartan held due to soft BP, she was discharged home, weight 125 lbs.  Today she returns for HF follow up with her daughter. She is not SOB walking on flat ground. She has CP at night, resolves with nitroglycerin. Of note, she has been out of her Imdur x 2 weeks. Occasional palpitations and positional dizziness. Denies abnormal bleeding, edema, or PND/Orthopnea. Appetite ok. No fever or chills. Weight at home 130 pounds. Out of several meds x 2 weeks. She  has Lasix PRN. Continues to smoke 1 ppd.  Labs (11/23): K 4.1, creatinine 0.66, hgb 11.3, Lp(a) 9.1, LDL 69 Labs (12/23): K 3.8, Creatinine 0.8 Labs (1/24): K 3.7, creatinine 0.83 Labs (3/24): LDL 16 Labs (6/24): K 5.2, creatinine 0.80  ECG (personally reviewed): NSR, LBBB 152 msec  PMH: 1. Hypothyroidism 2. H/o heavy ETOH 3. Active smoker 4. CAD: NSTEMI 2019 with PCI to RCA.  - NSTEMI 2020 with stents to RCA.  - Inferior STEMI (11/23): 90% ISR mid-distal RCA, 99% stenosis ostial PDA and 95% stenosis ostial PLV. Patient had PTCA to the long mid-distal RCA stented segment and DES to ostial PLV and PDA.   - LHC (6/24):  ISR in distal RCA, and new stenosis in proximal RCA. Plan for medical management. 5. Chronic systolic CHF: Ischemic cardiomyopathy.  - Echo (5/20) with EF 30-35%.  - LV-gram 11/23 with EF 20% - Echo (3/24): EF 25-30%, septal-lateral dyssynchrony, mildly decreased RV systolic function. L - Ltd Echo (6/24): EF 20-25%, RV mildly reduced.  6. PAD: Long occlusion left SFA 7. LBBB   Current Outpatient Medications  Medication Sig Dispense Refill   aspirin 81 MG chewable tablet Chew 1 tablet (81 mg total) by mouth daily. 30 tablet 3   carvedilol (COREG) 3.125 MG tablet Take 1 tablet (3.125 mg total) by mouth 2 (two) times daily. 60 tablet 0   empagliflozin (JARDIANCE) 10 MG TABS tablet Take 1 tablet (10 mg total) by mouth daily before breakfast. 30 tablet 6   isosorbide mononitrate (  IMDUR) 30 MG 24 hr tablet Take 0.5 tablets (15 mg total) by mouth daily. 30 tablet 0   Multiple Vitamin (MULTIVITAMIN WITH MINERALS) TABS tablet Take 1 tablet by mouth daily. 30 tablet 0   nitroGLYCERIN (NITROSTAT) 0.4 MG SL tablet Place 1 tablet (0.4 mg total) under the tongue every 5 (five) minutes x 3 doses as needed for chest pain. 25 tablet 12   Omega-3 Fatty Acids (FISH OIL) 1200 MG CAPS Take 1,200 mg by mouth daily.     ranolazine (RANEXA) 500 MG 12 hr tablet Take 1 tablet (500 mg total)  by mouth 2 (two) times daily. 60 tablet 0   rosuvastatin (CRESTOR) 40 MG tablet Take 1 tablet (40 mg total) by mouth daily. 30 tablet 0   ticagrelor (BRILINTA) 90 MG TABS tablet Take 1 tablet (90 mg total) by mouth 2 (two) times daily. 180 tablet 3   benzonatate (TESSALON) 100 MG capsule Take 100 mg by mouth every 8 (eight) hours. (Patient not taking: Reported on 06/08/2023)     levothyroxine (SYNTHROID) 100 MCG tablet Take 1 tablet (100 mcg total) by mouth daily at 6 (six) AM. 30 tablet 0   No current facility-administered medications for this encounter.   Allergies  Allergen Reactions   Penicillins Other (See Comments)    Fever, vomiting Has patient had a PCN reaction causing immediate rash, facial/tongue/throat swelling, SOB or lightheadedness with hypotension: NO Has patient had a PCN reaction causing severe rash involving mucus membranes or skin necrosis: NO Has patient had a PCN reaction that required hospitalization: NO Has patient had a PCN reaction occurring within the last 10 years: NO If all of the above answers are "NO", then may proceed Childhood allergy   Social History   Socioeconomic History   Marital status: Married    Spouse name: Not on file   Number of children: Not on file   Years of education: Not on file   Highest education level: Not on file  Occupational History   Not on file  Tobacco Use   Smoking status: Former    Current packs/day: 0.00    Average packs/day: 1 pack/day for 22.0 years (22.0 ttl pk-yrs)    Types: Cigarettes    Start date: 08/09/1996    Quit date: 08/09/2018    Years since quitting: 4.8   Smokeless tobacco: Never  Substance and Sexual Activity   Alcohol use: Yes    Alcohol/week: 8.0 standard drinks of alcohol    Types: 8 Glasses of wine per week   Drug use: No   Sexual activity: Not on file  Other Topics Concern   Not on file  Social History Narrative   Married   Gets regular exercise   Social Determinants of Health   Financial  Resource Strain: Not on file  Food Insecurity: No Food Insecurity (04/13/2023)   Hunger Vital Sign    Worried About Running Out of Food in the Last Year: Never true    Ran Out of Food in the Last Year: Never true  Transportation Needs: No Transportation Needs (04/13/2023)   PRAPARE - Administrator, Civil Service (Medical): No    Lack of Transportation (Non-Medical): No  Physical Activity: Not on file  Stress: Not on file  Social Connections: Unknown (03/18/2022)   [facility]   Social Network    Social Network: Not on file  Intimate Partner Violence: Not At Risk (04/13/2023)   Humiliation, Afraid, Rape, and Kick questionnaire  Fear of Current or Ex-Partner: No    Emotionally Abused: No    Physically Abused: No    Sexually Abused: No   Family History  Problem Relation Age of Onset   Coronary artery disease Mother    Coronary artery disease Maternal Grandmother    Hypertension Father    Diabetes Father    BP 132/84   Pulse 71   Ht 5\' 2"  (1.575 m)   Wt 60.3 kg (133 lb)   SpO2 96%   BMI 24.33 kg/m   Wt Readings from Last 3 Encounters:  06/08/23 60.3 kg (133 lb)  04/16/23 57 kg (125 lb 10.6 oz)  01/08/23 56.5 kg (124 lb 9.6 oz)   PHYSICAL EXAM: General:  NAD. No resp difficulty, walked into clinic HEENT: Normal Neck: Supple. No JVD. Carotids 2+ bilat; no bruits. No lymphadenopathy or thryomegaly appreciated. Cor: PMI nondisplaced. Regular rate & rhythm. No rubs, gallops or murmurs. Lungs: Clear Abdomen: Soft, nontender, nondistended. No hepatosplenomegaly. No bruits or masses. Good bowel sounds. Extremities: No cyanosis, clubbing, rash, edema Neuro: Alert & oriented x 3, cranial nerves grossly intact. Moves all 4 extremities w/o difficulty. Affect pleasant.  ASSESSMENT & PLAN: 1. CAD: H/o NSTEMI involving RCA in 2019 and 2020.  Ran out of Plavix, presented with inferior STEMI in 11/23.  She had successful stenting of the ostium of the PLV and PDA  branches and PTCA of the entire previously stented RCA. Admitted 6/24 with CP, LHC showed ISR distal RCA, and new stenosis in proximal RCA. Plan for medical management.  Having CP in the evenings, resolved with SL nitroglycerin.  - Restart Imdur 15 mg daily. - Continue Ranexa 500 mg bid, QTc 506 msec on ECG today. - Continue ASA + ticagrelor. After a year, transition to long-term Plavix.   - Continue Crestor 40 mg daily. Good LDL (3/24) 2. Chronic systolic CHF: Ischemic cardiomyopathy.  Echo in 5/20 with EF 30-35%. LV-gram at time of cath 11/23 showed EF down to 20%. She was not on HF meds at home. Echo 09/19/22 showed EF 20-25%, mod eccentric LVH, severe hypokinesis of the LV entire lateral wall. Normal RV function and size. Mild MR, trivial TR. Echo 3/24 showed EF 25-30% with septal-lateral dyssynchrony, mild RV dysfunction.  Ltd echo (6/24) EF 20-25%, RV mildly reduced. NYHA class IIb, she not volume overloaded on exam.  GDMT has been limited by soft BP.  - Restart losartan 12.5 mg daily. BMET/BNP today, repeat BMET in 7-10 days. - Continue Coreg 3.125 mg bid.  - Continue Jardiance 10 mg daily - She has PRN lasix at home. - Has LBBB and persistently low EF from ischemic cardiomyopathy.  She qualifies for CRT-D placement, she has EP follow up this month to discuss. - She refuses to wear Lifevest any longer.  3. Hypothyroidism: Per PCP.  She is on levothyroxine 4. Smoking: Discussed smoking cessation.  - Declines Wellbutrin. Planning to try nicotine patches. 5. PAD: History of long occlusion left SFA.  Has Left calf claudication with long distance walking.  No rest pain or pedal ulcerations.   Follow up in 3 weeks with PharmD (add spiro), 6 weeks with APP and 12 weeks with Dr. Kathreen Cornfield [facility] FNP-BC 06/08/2023

## 2023-06-07 ENCOUNTER — Telehealth (HOSPITAL_COMMUNITY): Payer: Self-pay

## 2023-06-07 NOTE — Telephone Encounter (Signed)
Called to confirm/remind patient of their appointment at the [facility] on 06/08/23.   Patient reminded to bring all medications and/or complete list.  Confirmed patient has transportation. Gave directions, instructed to utilize valet parking.  Confirmed appointment prior to ending call.

## 2023-06-08 ENCOUNTER — Ambulatory Visit (HOSPITAL_COMMUNITY)
Admission: RE | Admit: 2023-06-08 | Discharge: 2023-06-08 | Disposition: A | Discharge status: Home / Self Care | Payer: Medicaid Other | Source: Ambulatory Visit | Attending: Family Medicine | Admitting: Family Medicine

## 2023-06-08 ENCOUNTER — Other Ambulatory Visit (HOSPITAL_COMMUNITY): Payer: Self-pay

## 2023-06-08 ENCOUNTER — Encounter (HOSPITAL_COMMUNITY): Payer: Self-pay

## 2023-06-08 VITALS — BP 132/84 | HR 71 | Ht 62.0 in | Wt 133.0 lb

## 2023-06-08 DIAGNOSIS — R9431 Abnormal electrocardiogram [ECG] [EKG]: Secondary | ICD-10-CM | POA: Diagnosis not present

## 2023-06-08 DIAGNOSIS — F17210 Nicotine dependence, cigarettes, uncomplicated: Secondary | ICD-10-CM | POA: Insufficient documentation

## 2023-06-08 DIAGNOSIS — F17200 Nicotine dependence, unspecified, uncomplicated: Secondary | ICD-10-CM | POA: Diagnosis not present

## 2023-06-08 DIAGNOSIS — I252 Old myocardial infarction: Secondary | ICD-10-CM | POA: Diagnosis not present

## 2023-06-08 DIAGNOSIS — T463X6A Underdosing of coronary vasodilators, initial encounter: Secondary | ICD-10-CM | POA: Diagnosis not present

## 2023-06-08 DIAGNOSIS — E039 Hypothyroidism, unspecified: Secondary | ICD-10-CM

## 2023-06-08 DIAGNOSIS — I2510 Atherosclerotic heart disease of native coronary artery without angina pectoris: Secondary | ICD-10-CM | POA: Insufficient documentation

## 2023-06-08 DIAGNOSIS — I5022 Chronic systolic (congestive) heart failure: Secondary | ICD-10-CM | POA: Diagnosis not present

## 2023-06-08 DIAGNOSIS — I739 Peripheral vascular disease, unspecified: Secondary | ICD-10-CM | POA: Insufficient documentation

## 2023-06-08 DIAGNOSIS — Z91128 Patient's intentional underdosing of medication regimen for other reason: Secondary | ICD-10-CM | POA: Insufficient documentation

## 2023-06-08 DIAGNOSIS — R0789 Other chest pain: Secondary | ICD-10-CM | POA: Diagnosis present

## 2023-06-08 MED ORDER — CARVEDILOL 3.125 MG PO TABS
3.1250 mg | ORAL_TABLET | Freq: Two times a day (BID) | ORAL | 6 refills | Status: DC
Start: 2023-06-08 — End: 2023-07-13
  Filled 2023-06-08 – 2023-07-02 (×2): qty 60, 30d supply, fill #0

## 2023-06-08 MED ORDER — ROSUVASTATIN CALCIUM 40 MG PO TABS
40.0000 mg | ORAL_TABLET | Freq: Every day | ORAL | 6 refills | Status: DC
Start: 2023-06-08 — End: 2024-01-25
  Filled 2023-06-08 – 2023-07-02 (×3): qty 30, 30d supply, fill #0
  Filled 2023-08-02: qty 30, 30d supply, fill #1
  Filled 2023-09-17: qty 30, 30d supply, fill #2
  Filled 2023-10-10: qty 30, 30d supply, fill #3
  Filled 2023-11-14: qty 30, 30d supply, fill #4

## 2023-06-08 MED ORDER — LOSARTAN POTASSIUM 25 MG PO TABS
12.5000 mg | ORAL_TABLET | Freq: Every day | ORAL | 6 refills | Status: DC
  Filled 2023-06-08 – 2023-08-17 (×3): qty 15, 30d supply, fill #0
  Filled 2023-09-18: qty 15, 30d supply, fill #1
  Filled 2023-10-23: qty 15, 30d supply, fill #2

## 2023-06-08 MED ORDER — ASPIRIN 81 MG PO CHEW
81.0000 mg | CHEWABLE_TABLET | Freq: Every day | ORAL | 6 refills | Status: AC
Start: 2023-06-08 — End: ?
  Filled 2023-06-08 (×3): qty 30, 30d supply, fill #0

## 2023-06-08 MED ORDER — FUROSEMIDE 20 MG PO TABS
20.0000 mg | ORAL_TABLET | ORAL | 6 refills | Status: DC | PRN
  Filled 2023-06-08 (×2): qty 30, 30d supply, fill #0

## 2023-06-08 MED ORDER — ISOSORBIDE MONONITRATE ER 30 MG PO TB24
15.0000 mg | ORAL_TABLET | Freq: Every day | ORAL | 6 refills | Status: DC
  Filled 2023-06-08 (×3): qty 30, 60d supply, fill #0

## 2023-06-08 MED ORDER — RANOLAZINE ER 500 MG PO TB12
500.0000 mg | ORAL_TABLET | Freq: Two times a day (BID) | ORAL | 6 refills | Status: DC
  Filled 2023-06-08 – 2023-07-05 (×4): qty 60, 30d supply, fill #0
  Filled 2023-08-08: qty 60, 30d supply, fill #1
  Filled 2023-09-07: qty 60, 30d supply, fill #2
  Filled 2023-10-10: qty 60, 30d supply, fill #3
  Filled 2023-11-14: qty 60, 30d supply, fill #4

## 2023-06-08 NOTE — Patient Instructions (Addendum)
Thank you for coming in today  EKG was done today  If you had labs drawn today, any labs that are abnormal the clinic will call you No news is good news  Medications: RESTART Losartan 12.5 mg 1/2 tablet daily RESTART Imdur 15 mg 1/2 tablet daily  Lasix 20 mg daily as needed For weight gain of 3 lbs in 24 hours or 5 lbs in a week   Follow up appointments: Your physician recommends that you return for lab work in: 7-10 days at [facility]   3 weeks with Pharmacy 6 weeks in clinic   12 weeks With Dr. Earlean Shawl will receive a reminder letter in the mail a few months in advance. If you don't receive a letter, please call our office to schedule the follow-up appointment.   Your physician recommends that you schedule a follow-up appointment in:     Do the following things EVERYDAY: Weigh yourself in the morning before breakfast. Write it down and keep it in a log. Take your medicines as prescribed Eat low salt foods--Limit salt (sodium) to 2000 mg per day.  Stay as active as you can everyday Limit all fluids for the day to less than 2 liters   At the [facility], you and your health needs are our priority. As part of our continuing mission to provide you with exceptional heart care, we have created designated Provider Care Teams. These Care Teams include your primary Cardiologist (physician) and Advanced Practice Providers (APPs- Physician Assistants and Nurse Practitioners) who all work together to provide you with the care you need, when you need it.   You may see any of the following providers on your designated Care Team at your next follow up: Dr Arvilla Meres Dr Marca Ancona Dr. Marcos Eke, NP Robbie Lis, [place] [facility] [place], [place] Brynda Peon, NP Karle Plumber, PharmD   Please be sure to bring in all your medications bottles to every appointment.    Thank you for choosing [facility]  If you have any questions or concerns before your next appointment please send [place] a message through [place] or call our office at [phone].    TO LEAVE A MESSAGE FOR THE NURSE SELECT OPTION 2, PLEASE LEAVE A MESSAGE INCLUDING: YOUR NAME DATE OF BIRTH CALL BACK NUMBER REASON FOR CALL**this is important as we prioritize the call backs  YOU WILL RECEIVE A CALL BACK THE SAME DAY AS LONG AS YOU CALL BEFORE 4:00 PM

## 2023-06-08 NOTE — Progress Notes (Signed)
CSW consulted to help get pt medications from Outpatient pharmacy as she had been without her medications.  CSW able to pick up medications and assist with payment- provided to patient before she left appt.  Burna Sis, LCSW Clinical Social Worker [facility] Desk#: [phone] Cell#: [phone]

## 2023-06-08 NOTE — Progress Notes (Signed)
Called and had pt medications switched to [facility] for delivery on future refills. Called pt and pt verbalized understanding of information given.

## 2023-06-29 NOTE — Progress Notes (Incomplete)
***In Progress***    Advanced Heart Failure Clinic Note  PCP: Bucio, Julian Reil, FNP HF Cardiologist: Dr. Shirlee Latch HPI:  Whitney May is a 62 y.o. female with hx of CAD with NSTEMI in 2019 with stent to RCA and then in 2020 NSTEMI with plaque rupture of RCA in-stent with 2 overlying stents to RCA, hypothyroidism, HLD, PAD, tobacco use, ETOH and ischemic cardiomyopathy EF 30-35% on 03/2019 echo who is being seen 09/18/2022 for the evaluation of STEMI with ischemic cardiomyopathy. She has a history of PAD with long occlusion left SFA.    Admitted 09/2022 with CP, ran out of Plavix for several days. EKG showed LBBB and prominent inferior ST elevation. She had emergent cath for inferior STEMI with 90% ISR mid-distal RCA, 99% stenosis ostial PDA and 95% stenosis ostial PLV. Patient had PTCA to the long mid-distal RCA stented segment and DES to ostial PLV and PDA.  LV-gram showed EF in 20% range. Echo 09/19/22 showed EF 20-25%, mod eccentric LVH, severe hypokinesis of the LV entire lateral wall. Normal RV function and size. Mild MR, trivial TR. GDMT titrated and discharged home with LifeVest, weight 110 lbs.   Echo 01/2023 showed EF 25-30% with septal-lateral dyssynchrony, mild RV dysfunction. No longer wearing LifeVest. Awaiting Medicaid so she can get coverage for CRT-D.   Admitted 04/2023 with NSTEMI. Transiently required NE for hypotension. Ltd Echo showed EF 20-25%, RV mildly reduced. LHC showed ISR in distal RCA, and new stenosis in proximal RCA, planned medical management. Ranexa started. Cleda Daub and losartan held due to soft BP, she was discharged home, weight 125 lbs.  At last HF follow up she returned with her daughter. She was not SOB walking on flat ground. She had CP at night, resolved with nitroglycerin. Of note, she has been out of her Imdur x 2 weeks. Occasional palpitations and positional dizziness. Denied abnormal bleeding, edema, or PND/Orthopnea. Appetite ok. No fever or chills. Weight at home 130  pounds. Out of several meds x 2 weeks. She had Lasix PRN. Continues to smoke 1 ppd.  Today she returns to HF clinic for pharmacist medication titration. At last visit with NP she was started on losartan 12.5 mg daily and Lasix 20 mg was changed to PRN.  HF Medications: carvedilol 3.125 mg bid losartan 12.5 mg daily Jardiance 10 mg daily isosorbide mononitrate 15 mg daily Lasix 20 mg PRN  Has the patient been experiencing any side effects to the medications prescribed?  {YES NO:22349}  Does the patient have any problems obtaining medications due to transportation or finances?   {YES NO:22349} Medcaid Understanding of regimen: {excellent/good/fair/poor:19665} Understanding of indications: {excellent/good/fair/poor:19665} Potential of compliance: {excellent/good/fair/poor:19665} Patient understands to avoid NSAIDs. Patient understands to avoid decongestants.    Pertinent Lab Values: 04/17/23: Serum creatinine 0.80, BUN 17, Potassium 5.2, Sodium 135, BNP 469.7 (04/15/23)  Vital Signs: Weight: *** (last clinic weight: 133 lbs) Blood pressure: ***  Heart rate: ***   Assessment/Plan: *** 1. CAD: H/o NSTEMI involving RCA in 2019 and 2020.  Ran out of Plavix, presented with inferior STEMI in 11/23.  She had successful stenting of the ostium of the PLV and PDA branches and PTCA of the entire previously stented RCA. Admitted 6/24 with CP, LHC showed ISR distal RCA, and new stenosis in proximal RCA. Plan for medical management.  Having CP in the evenings, resolved with SL nitroglycerin.  - Restart Imdur 15 mg daily. - Continue Ranexa 500 mg bid, QTc 506 msec on ECG today. - Continue ASA +  ticagrelor. After a year, transition to long-term Plavix.   - Continue Crestor 40 mg daily. Good LDL (3/24) 2. Chronic systolic CHF: Ischemic cardiomyopathy.  Echo in 5/20 with EF 30-35%. LV-gram at time of cath 11/23 showed EF down to 20%. She was not on HF meds at home. Echo 09/19/22 showed EF 20-25%, mod  eccentric LVH, severe hypokinesis of the LV entire lateral wall. Normal RV function and size. Mild MR, trivial TR. Echo 3/24 showed EF 25-30% with septal-lateral dyssynchrony, mild RV dysfunction.  Ltd echo (6/24) EF 20-25%, RV mildly reduced. NYHA class IIb, she not volume overloaded on exam.  GDMT has been limited by soft BP.  - Restart losartan 12.5 mg daily. BMET/BNP today, repeat BMET in 7-10 days. - Continue Coreg 3.125 mg bid.  - Continue Jardiance 10 mg daily - She has PRN lasix at home. - Has LBBB and persistently low EF from ischemic cardiomyopathy.  She qualifies for CRT-D placement, she has EP follow up this month to discuss. - She refuses to wear Lifevest any longer.  3. Hypothyroidism: Per PCP.  She is on levothyroxine 4. Smoking: Discussed smoking cessation.  - Declines Wellbutrin. Planning to try nicotine patches. 5. PAD: History of long occlusion left SFA.  Has Left calf claudication with long distance walking.  No rest pain or pedal ulcerations.  Follow up 07/13/23 with NP/PA   Karle Plumber, PharmD, BCPS, BCCP, CPP Heart Failure Clinic Pharmacist [phone]

## 2023-07-02 ENCOUNTER — Other Ambulatory Visit (HOSPITAL_COMMUNITY): Payer: Self-pay

## 2023-07-03 ENCOUNTER — Ambulatory Visit: Payer: Medicaid Other | Admitting: Cardiovascular Disease

## 2023-07-05 ENCOUNTER — Other Ambulatory Visit (HOSPITAL_COMMUNITY): Payer: Self-pay | Admitting: Pharmacist

## 2023-07-05 ENCOUNTER — Ambulatory Visit (HOSPITAL_COMMUNITY)
Admission: RE | Admit: 2023-07-05 | Discharge: 2023-07-05 | Disposition: A | Discharge status: Home / Self Care | Payer: Medicaid Other | Source: Ambulatory Visit | Attending: Cardiology | Admitting: Cardiology

## 2023-07-05 ENCOUNTER — Other Ambulatory Visit (HOSPITAL_COMMUNITY): Payer: Self-pay

## 2023-07-05 ENCOUNTER — Other Ambulatory Visit: Payer: Self-pay

## 2023-07-05 VITALS — BP 132/76 | HR 89 | Wt 114.0 lb

## 2023-07-05 DIAGNOSIS — I2510 Atherosclerotic heart disease of native coronary artery without angina pectoris: Secondary | ICD-10-CM | POA: Diagnosis not present

## 2023-07-05 DIAGNOSIS — Z7982 Long term (current) use of aspirin: Secondary | ICD-10-CM | POA: Diagnosis not present

## 2023-07-05 DIAGNOSIS — Z955 Presence of coronary angioplasty implant and graft: Secondary | ICD-10-CM | POA: Diagnosis not present

## 2023-07-05 DIAGNOSIS — I255 Ischemic cardiomyopathy: Secondary | ICD-10-CM | POA: Diagnosis not present

## 2023-07-05 DIAGNOSIS — I252 Old myocardial infarction: Secondary | ICD-10-CM | POA: Diagnosis not present

## 2023-07-05 DIAGNOSIS — I739 Peripheral vascular disease, unspecified: Secondary | ICD-10-CM | POA: Insufficient documentation

## 2023-07-05 DIAGNOSIS — I5022 Chronic systolic (congestive) heart failure: Secondary | ICD-10-CM | POA: Insufficient documentation

## 2023-07-05 DIAGNOSIS — Z79899 Other long term (current) drug therapy: Secondary | ICD-10-CM | POA: Insufficient documentation

## 2023-07-05 DIAGNOSIS — I447 Left bundle-branch block, unspecified: Secondary | ICD-10-CM | POA: Diagnosis not present

## 2023-07-05 DIAGNOSIS — E039 Hypothyroidism, unspecified: Secondary | ICD-10-CM | POA: Insufficient documentation

## 2023-07-05 DIAGNOSIS — E785 Hyperlipidemia, unspecified: Secondary | ICD-10-CM | POA: Diagnosis not present

## 2023-07-05 LAB — BASIC METABOLIC PANEL
Anion gap: 14 (ref 5–15)
BUN: 11 mg/dL (ref 6–20)
CO2: 27 mmol/L (ref 22–32)
Calcium: 9.5 mg/dL (ref 8.9–10.3)
Chloride: 92 mmol/L — ABNORMAL LOW (ref 98–111)
Creatinine, Ser: 1.15 mg/dL — ABNORMAL HIGH (ref 0.44–1.00)
GFR, Estimated: 55 mL/min — ABNORMAL LOW (ref >60)
Glucose, Bld: 117 mg/dL — ABNORMAL HIGH (ref 70–99)
Potassium: 2.3 mmol/L — CL (ref 3.5–5.1)
Sodium: 133 mmol/L — ABNORMAL LOW (ref 135–145)

## 2023-07-05 MED ORDER — POTASSIUM CHLORIDE CRYS ER 20 MEQ PO TBCR
20.0000 meq | EXTENDED_RELEASE_TABLET | Freq: Every day | ORAL | 3 refills | Status: DC
  Filled 2023-07-05: qty 30, 30d supply, fill #0
  Filled 2023-08-17: qty 30, 30d supply, fill #1
  Filled 2023-09-18: qty 30, 30d supply, fill #2

## 2023-07-05 MED ORDER — ISOSORBIDE MONONITRATE ER 30 MG PO TB24
30.0000 mg | ORAL_TABLET | Freq: Every day | ORAL | 6 refills | Status: DC
  Filled 2023-07-05: qty 30, 30d supply, fill #0
  Filled 2023-08-17: qty 30, 30d supply, fill #1
  Filled 2023-09-14: qty 30, 30d supply, fill #2
  Filled 2023-10-23: qty 30, 30d supply, fill #3
  Filled 2023-11-20 (×2): qty 30, 30d supply, fill #4

## 2023-07-05 MED ORDER — NITROGLYCERIN 0.4 MG SL SUBL
0.4000 mg | SUBLINGUAL_TABLET | SUBLINGUAL | 12 refills | Status: AC | PRN
  Filled 2023-07-05: qty 25, 8d supply, fill #0
  Filled 2023-08-17: qty 25, 8d supply, fill #1
  Filled 2023-09-18: qty 25, 8d supply, fill #2
  Filled 2023-10-23: qty 25, 8d supply, fill #3
  Filled 2023-11-20 (×2): qty 25, 8d supply, fill #4

## 2023-07-05 NOTE — Progress Notes (Signed)
Scr bump and K low at 2.3. Discussed with Dr. Shirlee Latch. Patient has already received instructions to stop taking PRN Lasix (see OV note from 07/05/23). Will now have her take KCL 40 mEq x3 days, then continue 20 mEq daily. Repeat BMET next week at APP follow up visit. Patient expressed understanding.

## 2023-07-05 NOTE — Progress Notes (Signed)
Advanced Heart Failure Clinic Note   PCP: Bucio, Julian Reil, FNP HF Cardiologist: Dr. Shirlee Latch  HPI:  Whitney May is a 62 y.o. female with hx of CAD with NSTEMI in 2019 with stent to RCA and then in 2020 NSTEMI with plaque rupture of RCA in-stent with 2 overlying stents to RCA, hypothyroidism, HLD, PAD, tobacco use, ETOH and ischemic cardiomyopathy EF 30-35% on 03/2019 ECHO.   Admitted 09/2022 with CP, ran out of Plavix for several days. EKG showed LBBB and prominent inferior ST elevation. She had emergent cath for inferior STEMI with 90% ISR mid-distal RCA, 99% stenosis ostial PDA and 95% stenosis ostial PLV. Patient had PTCA to the long mid-distal RCA stented segment and DES to ostial PLV and PDA.  LV-gram showed EF in 20% range. Echo 09/19/22 showed EF 20-25%, mod eccentric LVH, severe hypokinesis of the LV entire lateral wall. Normal RV function and size. Mild MR, trivial TR. GDMT titrated and discharged home with LifeVest, weight 110 lbs.   Echo 01/2023 showed EF 25-30% with septal-lateral dyssynchrony, mild RV dysfunction. No longer wearing LifeVest. Awaiting Medicaid so she can get coverage for CRT-D.   Admitted 04/2023 with NSTEMI. Transiently required NE for hypotension. Ltd Echo showed EF 20-25%, RV mildly reduced. LHC showed ISR in distal RCA, and new stenosis in proximal RCA, planned medical management. Ranexa started. Spironolactone and losartan held due to soft BP, she was discharged home, weight 125 lbs.  At last HF follow up she returned with her daughter. She was not SOB walking on flat ground. She had CP at night, resolved with nitroglycerin. Of note, she has been out of her isosorbide mononitrate x 2 weeks. Occasional palpitations and positional dizziness. Denied abnormal bleeding, edema, or PND/Orthopnea. Appetite ok. No fever or chills. Weight at home 130 pounds. Out of several meds x 2 weeks. She had Lasix PRN. Continued to smoke 1 ppd.  Today she returns to HF clinic for pharmacist  medication titration. At last visit with NP she was started on losartan 12.5 mg daily. Overall feeling not well. She has been noticing increased dizziness in the last week. Says this comes in "waves" and sometimes will occur when just sitting but also occurs when bending over. Says she has had vertigo in the past, but this doesn't feel like those episodes. Of note, she has been taking Lasix 20 mg 3X per week at night for the last week because of abdominal bloating that occurs after she eats dinner. After discussion in clinic, it appears that the dizziness worsened after she started taking the PRN Lasix more consistently. Endorses fatigue that is similar to her baseline. Had some CP 2 nights ago and took a SL NTG to relieve the pain. Says she wakes up with CP a few times per week. She has taken a couple of SL NTG over the past week. No SOB/DOE. No LEE. Of note, she is down 19 lbs since last visit. She has transportation limitations that makes it difficult to get to appointments. Meds are being mailed to her through [facility].   HF Medications: carvedilol 3.125 mg bid losartan 12.5 mg daily Jardiance 10 mg daily isosorbide mononitrate 15 mg daily Lasix 20 mg PRN  Has the patient been experiencing any side effects to the medications prescribed?  Yes, she has been experiencing dizziness. This is possibly due to dehydration with increased PRN Lasix use or could be due to excessive use of SL NTG in combination with long-acting nitrate.  Does the patient have any problems obtaining medications due to transportation or finances?  Yes. Patient has transportation limitations. Medications are going through [facility]. Getting medications shipped to her through [facility].    Understanding of regimen: good Understanding of indications: good Potential of compliance: fair Patient understands to avoid NSAIDs. Patient understands to avoid decongestants.    Pertinent Lab Values: 04/17/23: Serum creatinine 0.80, BUN 17,  Potassium 5.2, Sodium 135, BNP 469.7 (04/15/23) BMET today pending. BNP was ordered but was not drawn.   Vital Signs: Weight: 114 lbs (last clinic weight: 133 lbs) Blood pressure: 132/76  Heart rate: 89   Assessment/Plan:  1. CAD: H/o NSTEMI involving RCA in 2019 and 2020.  Ran out of Plavix, presented with inferior STEMI in 09/2022.  She had successful stenting of the ostium of the PLV and PDA branches and PTCA of the entire previously stented RCA. Admitted 04/2023 with CP, LHC showed ISR distal RCA, and new stenosis in proximal RCA. Plan for medical management.  Having CP at night, resolved with SL nitroglycerin.  - Increase isosorbide mononitrate to 30 mg daily. Hopefully this will help decrease the amount of SL NTG she is using. Concerned this is contributing to her increased dizziness.  - Continue Ranexa 500 mg bid, QTc 506 msec on ECG 04/16/23 . - Continue ASA 81 mg daily + Brilinta 90 mg bid. After a year, transition to long-term Plavix.   - Continue Crestor 40 mg daily. Good LDL (01/2023) 2. Chronic systolic CHF: Ischemic cardiomyopathy.  Echo in 03/2019 with EF 30-35%. LV-gram at time of cath 09/2022 showed EF down to 20%. She was not on HF meds at home. Echo 09/19/22 showed EF 20-25%, mod eccentric LVH, severe hypokinesis of the LV entire lateral wall. Normal RV function and size. Mild MR, trivial TR. Echo 01/2023 showed EF 25-30% with septal-lateral dyssynchrony, mild RV dysfunction.  Ltd echo (04/2023) EF 20-25%, RV mildly reduced. - NYHA class IIb, she not volume overloaded on exam. Concerned she is dry due to overusing PRN Lasix, down 19 lbs from last visit.  GDMT has been limited by soft BP.  - BMET today pending. Ordered BNP but does not appear to have been drawn.  - Has been taking Lasix 3X per week for abdominal bloating but appears to be dehydrated  as she notes increased dizziness and has had a 19 lb weight loss from last clinic visit. Will have her stop using PRN Lasix for abdominal  bloating. Will instead use only for LEE or weight gain of 3 lbs overnight or 5 lbs in 1 weeks.  - Continue carvedilol 3.125 mg bid.  - Continue losartan 12.5 mg daily.  - Continue Jardiance 10 mg daily - Has LBBB and persistently low EF from ischemic cardiomyopathy.  She qualifies for CRT-D placement, she has canceled her EP visits. - Previously documented that she had  refused to wear Lifevest any longer.  3. Hypothyroidism: Per PCP.  She is on levothyroxine 100 mcg daily 4. Smoking: - Previously documented that she declined Wellbutrin. No mention of using nicotine patches during today's visit. 5. PAD: History of long occlusion left SFA.  Has Left calf claudication with long distance walking.  No rest pain or pedal ulcerations.   Follow up 07/13/23 with NP/PA   Karle Plumber, PharmD, BCPS, BCCP, CPP Heart Failure Clinic Pharmacist [phone]

## 2023-07-05 NOTE — Patient Instructions (Signed)
It was a pleasure seeing you today!  MEDICATIONS: -We are changing your medications today -Stop taking Lasix for abdominal bloating. Only take for weight gain (3 lbs over night or 5 lbs in 1 week) or swelling in ankles.  -Increase isosorbide mononitrate to 30 mg (1 tablet) daily -Call if you have questions about your medications.  LABS: -We will call you if your labs need attention.  NEXT APPOINTMENT: Return to clinic in 1 week with APP Clinic.  In general, to take care of your heart failure: -Limit your fluid intake to 2 Liters (half-gallon) per day.   -Limit your salt intake to ideally 2-3 grams (2000-3000 mg) per day. -Weigh yourself daily and record, and bring that "weight diary" to your next appointment.  (Weight gain of 2-3 pounds in 1 day typically means fluid weight.) -The medications for your heart are to help your heart and help you live longer.   -Please contact us before stopping any of your heart medications.  Call the clinic at [phone] with questions or to reschedule future appointments.

## 2023-07-10 NOTE — Progress Notes (Signed)
ADVANCED HF CLINIC  NOTE  PCP: Shelby Dubin, FNP HF Cardiologist: Dr. Shirlee Latch  HPI: Ms. Glidden is a 62 y.o. female with hx of CAD with NSTEMI in 2019 with stent to RCA and then in 2020 NSTEMI with plaque rupture of RCA in-stent with 2 overlying stents to RCA, hypothyroidism, HLD, PAD, tobacco use, ETOH and ischemic cardiomyopathy EF 30-35% on 5/20 echo who is being seen 09/18/2022 for the evaluation of STEMI with ischemic cardiomyopathy. She has a history of PAD with long occlusion left SFA.    Admitted 11/23 with CP, ran out of Plavix for several days. EKG showed LBBB and prominent inferior ST elevation. She had emergent cath for inferior STEMI with 90% ISR mid-distal RCA, 99% stenosis ostial PDA and 95% stenosis ostial PLV. Patient had PTCA to the long mid-distal RCA stented segment and DES to ostial PLV and PDA.  LV-gram showed EF in 20% range. Echo 09/19/22 showed EF 20-25%, mod eccentric LVH, severe hypokinesis of the LV entire lateral wall. Normal RV function and size. Mild MR, trivial TR. GDMT titrated and discharged home with LifeVest, weight 110 lbs.  Echo 3/24 showed EF 25-30% with septal-lateral dyssynchrony, mild RV dysfunction. No longer wearing LifeVest. Awaiting Medicaid so she can get coverage for CRT-D.  Admitted 6/24 with NSTEMI. Transiently required NE for hypotension. Ltd Echo showed EF 20-25%, RV mildly reduced. LHC showed ISR in distal RCA, and new stenosis in proximal RCA, planned medical management. Ranexa started. Cleda Daub and losartan held due to soft BP, she was discharged home, weight 125 lbs.  Follow up 8/24 with PharmD. Weight down 19 lbs in setting of taking PRN lasix daily. Labs showed K 2.3 and SCr 1.15. K repleted and instructed to stop Lasix.  Today she returns for HF follow up. Overall feeling better. CP has improved, using less SL nitroglycerin during the week (about 4-5x at night).  She is SOB walking on flat ground, having dizzy spells. Generally fatigued. Denies  palpitations, edema, or PND/Orthopnea. Appetite ok. No fever or chills. She is not weighing at home. Taking all medications. Smokes 1/2 ppd.  Labs (11/23): K 4.1, creatinine 0.66, hgb 11.3, Lp(a) 9.1, LDL 69 Labs (12/23): K 3.8, Creatinine 0.8 Labs (1/24): K 3.7, creatinine 0.83 Labs (3/24): LDL 16 Labs (6/24): K 5.2, creatinine 0.80 Labs (8/24):  K 2.3, creatinine 1.15  ECG (personally reviewed): NSR 69 bpm, LBBB QRS 156 msec  PMH: 1. Hypothyroidism 2. H/o heavy ETOH 3. Active smoker 4. CAD: NSTEMI 2019 with PCI to RCA.  - NSTEMI 2020 with stents to RCA.  - Inferior STEMI (11/23): 90% ISR mid-distal RCA, 99% stenosis ostial PDA and 95% stenosis ostial PLV. Patient had PTCA to the long mid-distal RCA stented segment and DES to ostial PLV and PDA.   - LHC (6/24):  ISR in distal RCA, and new stenosis in proximal RCA. Plan for medical management. 5. Chronic systolic CHF: Ischemic cardiomyopathy.  - Echo (5/20) with EF 30-35%.  - LV-gram 11/23 with EF 20% - Echo (3/24): EF 25-30%, septal-lateral dyssynchrony, mildly decreased RV systolic function. L - Ltd Echo (6/24): EF 20-25%, RV mildly reduced.  6. PAD: Long occlusion left SFA 7. LBBB  Current Outpatient Medications  Medication Sig Dispense Refill   aspirin 81 MG chewable tablet Chew 1 tablet (81 mg total) by mouth daily. 30 tablet 6   carvedilol (COREG) 3.125 MG tablet Take 1 tablet (3.125 mg total) by mouth 2 (two) times daily. 60 tablet 6  empagliflozin (JARDIANCE) 10 MG TABS tablet Take 1 tablet (10 mg total) by mouth daily before breakfast. 30 tablet 6   furosemide (LASIX) 20 MG tablet Take 1 tablet (20 mg total) by mouth daily as needed for weight gain of 3 lbs in 24 hours or 5 lbs in a week 30 tablet 6   isosorbide mononitrate (IMDUR) 30 MG 24 hr tablet Take 1 tablet (30 mg total) by mouth daily. 30 tablet 6   losartan (COZAAR) 25 MG tablet Take 0.5 tablets (12.5 mg total) by mouth daily. 15 tablet 6   Multiple Vitamin  (MULTIVITAMIN WITH MINERALS) TABS tablet Take 1 tablet by mouth daily. 30 tablet 0   nitroGLYCERIN (NITROSTAT) 0.4 MG SL tablet Place 1 tablet (0.4 mg total) under the tongue every 5 (five) minutes x 3 doses as needed for chest pain. 25 tablet 12   Omega-3 Fatty Acids (FISH OIL) 1200 MG CAPS Take 1,200 mg by mouth daily.     potassium chloride SA (KLOR-CON M) 20 MEQ tablet Take 1 tablet (20 mEq total) by mouth daily. 30 tablet 3   ranolazine (RANEXA) 500 MG 12 hr tablet Take 1 tablet (500 mg total) by mouth 2 (two) times daily. 60 tablet 6   rosuvastatin (CRESTOR) 40 MG tablet Take 1 tablet (40 mg total) by mouth daily. 30 tablet 6   ticagrelor (BRILINTA) 90 MG TABS tablet Take 1 tablet (90 mg total) by mouth 2 (two) times daily. 180 tablet 3   levothyroxine (SYNTHROID) 100 MCG tablet Take 1 tablet (100 mcg total) by mouth daily at 6 (six) AM. 30 tablet 0   No current facility-administered medications for this encounter.   Allergies  Allergen Reactions   Penicillins Other (See Comments)    Fever, vomiting Has patient had a PCN reaction causing immediate rash, facial/tongue/throat swelling, SOB or lightheadedness with hypotension: NO Has patient had a PCN reaction causing severe rash involving mucus membranes or skin necrosis: NO Has patient had a PCN reaction that required hospitalization: NO Has patient had a PCN reaction occurring within the last 10 years: NO If all of the above answers are "NO", then may proceed Childhood allergy   Social History   Socioeconomic History   Marital status: Married    Spouse name: Not on file   Number of children: Not on file   Years of education: Not on file   Highest education level: Not on file  Occupational History   Not on file  Tobacco Use   Smoking status: Former    Current packs/day: 0.00    Average packs/day: 1 pack/day for 22.0 years (22.0 ttl pk-yrs)    Types: Cigarettes    Start date: 08/09/1996    Quit date: 08/09/2018    Years since  quitting: 4.9   Smokeless tobacco: Never  Substance and Sexual Activity   Alcohol use: Yes    Alcohol/week: 8.0 standard drinks of alcohol    Types: 8 Glasses of wine per week   Drug use: No   Sexual activity: Not on file  Other Topics Concern   Not on file  Social History Narrative   Married   Gets regular exercise   Social Determinants of Health   Financial Resource Strain: Not on file  Food Insecurity: No Food Insecurity (04/13/2023)   Hunger Vital Sign    Worried About Running Out of Food in the Last Year: Never true    Ran Out of Food in the Last Year: Never true  Transportation  Needs: No Transportation Needs (04/13/2023)   PRAPARE - Administrator, Civil Service (Medical): No    Lack of Transportation (Non-Medical): No  Physical Activity: Not on file  Stress: Not on file  Social Connections: Unknown (03/18/2022)   Received from [facility], [facility]   Social Network    Social Network: Not on file  Intimate Partner Violence: Not At Risk (04/13/2023)   Humiliation, Afraid, Rape, and Kick questionnaire    Fear of Current or Ex-Partner: No    Emotionally Abused: No    Physically Abused: No    Sexually Abused: No   Family History  Problem Relation Age of Onset   Coronary artery disease Mother    Coronary artery disease Maternal Grandmother    Hypertension Father    Diabetes Father    BP 98/62   Pulse 74   Wt 59.3 kg (130 lb 12.8 oz)   SpO2 95%   BMI 23.92 kg/m   Wt Readings from Last 3 Encounters:  07/13/23 59.3 kg (130 lb 12.8 oz)  07/05/23 51.7 kg (114 lb)  06/08/23 60.3 kg (133 lb)   PHYSICAL EXAM: General:  NAD. No resp difficulty, walked into clinic, walked into clinic HEENT: Normal Neck: Supple. No JVD. Carotids 2+ bilat; no bruits. No lymphadenopathy or thryomegaly appreciated. Cor: PMI nondisplaced. Regular rate & rhythm. No rubs, gallops or murmurs. Lungs: Diminished in bases, faint wheeze in LLL Abdomen: Soft, nontender,  nondistended. No hepatosplenomegaly. No bruits or masses. Good bowel sounds. Extremities: No cyanosis, clubbing, rash, edema Neuro: Alert & oriented x 3, cranial nerves grossly intact. Moves all 4 extremities w/o difficulty. Affect pleasant.  ASSESSMENT & PLAN: 1. CAD: H/o NSTEMI involving RCA in 2019 and 2020.  Ran out of Plavix, presented with inferior STEMI in 11/23.  She had successful stenting of the ostium of the PLV and PDA branches and PTCA of the entire previously stented RCA. Admitted 6/24 with CP, LHC showed ISR distal RCA, and new stenosis in proximal RCA. Plan for medical management.  Having CP in the evenings, resolved with SL nitroglycerin.  - Continue Imdur 30 mg daily. - Continue Ranexa 500 mg bid, QTc 490 msec on ECG today. - Continue ASA + ticagrelor. After a year, transition to long-term Plavix.   - Continue Crestor 40 mg daily. Good LDL (3/24) 2. Chronic systolic CHF: Ischemic cardiomyopathy.  Echo in 5/20 with EF 30-35%. LV-gram at time of cath 11/23 showed EF down to 20%. She was not on HF meds at home. Echo 09/19/22 showed EF 20-25%, mod eccentric LVH, severe hypokinesis of the LV entire lateral wall. Normal RV function and size. Mild MR, trivial TR. Echo 3/24 showed EF 25-30% with septal-lateral dyssynchrony, mild RV dysfunction.  Ltd echo (6/24) EF 20-25%, RV mildly reduced. NYHA class IIb-III, she not volume overloaded on exam.  GDMT has been limited by soft BP.  - Stop Coreg - Start bisoprolol 2.5 mg daily (more cardio selective in setting of active smoker) - Continue losartan 12.5 mg daily. BMET/BNP today. - Continue Jardiance 10 mg daily. - She has PRN lasix at home, she does not currently need this. - No BP room to add spiro - Continue KCL 20 daily. - Has LBBB and persistently low EF from ischemic cardiomyopathy.  She qualifies for CRT-D placement, she has EP follow up next month to discuss, discussed arranged her transportation. - She refuses to wear Lifevest any  longer.  3. Hypothyroidism: Per PCP.  She is on  levothyroxine. 4. Smoking: Discussed smoking cessation.  - Has cut back from 1ppd to 1/2 ppd. Congratulated. 5. PAD: History of long occlusion left SFA.  Has Left calf claudication with long distance walking.  No rest pain or pedal ulcerations.  6. Fatigue: likely multifactorial. - Check iron panel today.  Follow up in 2 months with Dr. Shirlee Latch.  [facility] FNP-BC 07/13/2023

## 2023-07-12 ENCOUNTER — Telehealth (HOSPITAL_COMMUNITY): Payer: Self-pay

## 2023-07-12 NOTE — Telephone Encounter (Signed)
Called to confirm/remind patient of their appointment at the [facility] Clinic on 07/13/23  Patient reminded to bring all medications and/or complete list.  Confirmed patient has transportation. Gave directions, instructed to utilize valet parking.  Confirmed appointment prior to ending call.

## 2023-07-13 ENCOUNTER — Ambulatory Visit (HOSPITAL_COMMUNITY)
Admission: RE | Admit: 2023-07-13 | Discharge: 2023-07-13 | Disposition: A | Discharge status: Home / Self Care | Payer: Medicaid Other | Source: Ambulatory Visit | Attending: Family Medicine | Admitting: Family Medicine

## 2023-07-13 ENCOUNTER — Encounter (HOSPITAL_COMMUNITY): Payer: Self-pay

## 2023-07-13 ENCOUNTER — Other Ambulatory Visit (HOSPITAL_COMMUNITY): Payer: Self-pay

## 2023-07-13 VITALS — BP 98/62 | HR 74 | Wt 130.8 lb

## 2023-07-13 DIAGNOSIS — R5383 Other fatigue: Secondary | ICD-10-CM

## 2023-07-13 DIAGNOSIS — I447 Left bundle-branch block, unspecified: Secondary | ICD-10-CM | POA: Insufficient documentation

## 2023-07-13 DIAGNOSIS — I5022 Chronic systolic (congestive) heart failure: Secondary | ICD-10-CM | POA: Diagnosis not present

## 2023-07-13 DIAGNOSIS — I25119 Atherosclerotic heart disease of native coronary artery with unspecified angina pectoris: Secondary | ICD-10-CM

## 2023-07-13 DIAGNOSIS — I2119 ST elevation (STEMI) myocardial infarction involving other coronary artery of inferior wall: Secondary | ICD-10-CM | POA: Diagnosis not present

## 2023-07-13 DIAGNOSIS — E785 Hyperlipidemia, unspecified: Secondary | ICD-10-CM | POA: Diagnosis not present

## 2023-07-13 DIAGNOSIS — F17210 Nicotine dependence, cigarettes, uncomplicated: Secondary | ICD-10-CM | POA: Insufficient documentation

## 2023-07-13 DIAGNOSIS — I252 Old myocardial infarction: Secondary | ICD-10-CM | POA: Insufficient documentation

## 2023-07-13 DIAGNOSIS — Z720 Tobacco use: Secondary | ICD-10-CM

## 2023-07-13 DIAGNOSIS — E039 Hypothyroidism, unspecified: Secondary | ICD-10-CM

## 2023-07-13 DIAGNOSIS — Z8249 Family history of ischemic heart disease and other diseases of the circulatory system: Secondary | ICD-10-CM | POA: Insufficient documentation

## 2023-07-13 DIAGNOSIS — Z7982 Long term (current) use of aspirin: Secondary | ICD-10-CM | POA: Insufficient documentation

## 2023-07-13 DIAGNOSIS — Z79890 Hormone replacement therapy (postmenopausal): Secondary | ICD-10-CM | POA: Diagnosis not present

## 2023-07-13 DIAGNOSIS — I739 Peripheral vascular disease, unspecified: Secondary | ICD-10-CM | POA: Diagnosis not present

## 2023-07-13 DIAGNOSIS — Z955 Presence of coronary angioplasty implant and graft: Secondary | ICD-10-CM | POA: Diagnosis not present

## 2023-07-13 DIAGNOSIS — I255 Ischemic cardiomyopathy: Secondary | ICD-10-CM | POA: Insufficient documentation

## 2023-07-13 DIAGNOSIS — I2510 Atherosclerotic heart disease of native coronary artery without angina pectoris: Secondary | ICD-10-CM | POA: Diagnosis present

## 2023-07-13 DIAGNOSIS — Z79899 Other long term (current) drug therapy: Secondary | ICD-10-CM | POA: Insufficient documentation

## 2023-07-13 LAB — BASIC METABOLIC PANEL
Anion gap: 6 (ref 5–15)
BUN: 8 mg/dL (ref 6–20)
CO2: 22 mmol/L (ref 22–32)
Calcium: 8.8 mg/dL — ABNORMAL LOW (ref 8.9–10.3)
Chloride: 108 mmol/L (ref 98–111)
Creatinine, Ser: 0.9 mg/dL (ref 0.44–1.00)
GFR, Estimated: >60 mL/min (ref >60)
Glucose, Bld: 98 mg/dL (ref 70–99)
Potassium: 3.6 mmol/L (ref 3.5–5.1)
Sodium: 136 mmol/L (ref 135–145)

## 2023-07-13 LAB — CBC
HCT: 44.1 % (ref 36.0–46.0)
Hemoglobin: 14.1 g/dL (ref 12.0–15.0)
MCH: 27.6 pg (ref 26.0–34.0)
MCHC: 32 g/dL (ref 30.0–36.0)
MCV: 86.3 fL (ref 80.0–100.0)
Platelets: 270 10*3/uL (ref 150–400)
RBC: 5.11 MIL/uL (ref 3.87–5.11)
RDW: 14.9 % (ref 11.5–15.5)
WBC: 10.7 10*3/uL — ABNORMAL HIGH (ref 4.0–10.5)
nRBC: 0 % (ref 0.0–0.2)

## 2023-07-13 LAB — BRAIN NATRIURETIC PEPTIDE: B Natriuretic Peptide: 110.4 pg/mL — ABNORMAL HIGH (ref 0.0–100.0)

## 2023-07-13 LAB — IRON AND TIBC
Iron: 35 ug/dL (ref 28–170)
Saturation Ratios: 9 % — ABNORMAL LOW (ref 10.4–31.8)
TIBC: 372 ug/dL (ref 250–450)
UIBC: 337 ug/dL

## 2023-07-13 LAB — FERRITIN: Ferritin: 32 ng/mL (ref 11–307)

## 2023-07-13 MED ORDER — BISOPROLOL FUMARATE 5 MG PO TABS
2.5000 mg | ORAL_TABLET | Freq: Every day | ORAL | 6 refills | Status: DC
  Filled 2023-07-13 – 2023-08-02 (×3): qty 15, 30d supply, fill #0
  Filled 2023-08-17 – 2023-08-24 (×2): qty 15, 30d supply, fill #1
  Filled 2023-10-02: qty 15, 30d supply, fill #2
  Filled 2023-10-23 – 2023-10-24 (×2): qty 15, 30d supply, fill #3
  Filled 2023-11-10: qty 15, 30d supply, fill #4

## 2023-07-13 NOTE — Addendum Note (Signed)
Encounter addended by: Jacklynn Ganong, FNP on: 07/13/2023 1:57 PM  Actions taken: Alternative orders not taken and original order placed, Order list changed

## 2023-07-13 NOTE — Patient Instructions (Signed)
Thank you for coming in today  If you had labs drawn today, any labs that are abnormal the clinic will call you No news is good news  Medications: STOP Coreg START Bisoprolol 2.5 mg 1/2 tablet daily    Follow up appointments:  Your physician recommends that you schedule a follow-up appointment in:  2 months With Dr. Shirlee Latch    Do the following things EVERYDAY: Weigh yourself in the morning before breakfast. Write it down and keep it in a log. Take your medicines as prescribed Eat low salt foods--Limit salt (sodium) to 2000 mg per day.  Stay as active as you can everyday Limit all fluids for the day to less than 2 liters   At the [facility], you and your health needs are our priority. As part of our continuing mission to provide you with exceptional heart care, we have created designated Provider Care Teams. These Care Teams include your primary Cardiologist (physician) and Advanced Practice Providers (APPs- Physician Assistants and Nurse Practitioners) who all work together to provide you with the care you need, when you need it.   You may see any of the following providers on your designated Care Team at your next follow up: Dr Arvilla Meres Dr Marca Ancona Dr. Marcos Eke, NP Robbie Lis, [place] [facility] [place], [place] Brynda Peon, NP Karle Plumber, PharmD   Please be sure to bring in all your medications bottles to every appointment.    Thank you for choosing [facility]  If you have any questions or concerns before your next appointment please send [place] a message through [place] or call our office at [phone].    TO LEAVE A MESSAGE FOR THE NURSE SELECT OPTION 2, PLEASE LEAVE A MESSAGE INCLUDING: YOUR NAME DATE OF BIRTH CALL BACK NUMBER REASON FOR CALL**this is important as we prioritize the call backs  YOU WILL RECEIVE A CALL BACK THE SAME DAY AS LONG AS YOU CALL BEFORE  4:00 PM

## 2023-07-25 ENCOUNTER — Other Ambulatory Visit (HOSPITAL_COMMUNITY): Payer: Self-pay

## 2023-07-27 ENCOUNTER — Other Ambulatory Visit (HOSPITAL_COMMUNITY): Payer: Self-pay

## 2023-07-30 ENCOUNTER — Telehealth (HOSPITAL_COMMUNITY): Payer: Self-pay | Admitting: Pharmacy Technician

## 2023-07-30 ENCOUNTER — Other Ambulatory Visit (HOSPITAL_COMMUNITY): Payer: Self-pay

## 2023-07-30 NOTE — Telephone Encounter (Signed)
Advanced Heart Failure Patient Advocate Encounter  Prior Authorization for Bisoprolol has been approved.    PA# [id number] Effective dates: 07/30/23 through 07/29/24   Patients co-pay $0  Archer Asa, CPhT

## 2023-07-30 NOTE — Telephone Encounter (Signed)
Patient Advocate Encounter   Received notification from [facility] that prior authorization for Bisoprolol is required.   PA submitted on CoverMyMeds Key  [id number] Status is pending   Will continue to follow.

## 2023-07-31 ENCOUNTER — Other Ambulatory Visit (HOSPITAL_COMMUNITY): Payer: Self-pay

## 2023-08-02 ENCOUNTER — Other Ambulatory Visit (HOSPITAL_COMMUNITY): Payer: Self-pay

## 2023-08-03 ENCOUNTER — Other Ambulatory Visit (HOSPITAL_COMMUNITY): Payer: Self-pay

## 2023-08-07 ENCOUNTER — Encounter: Payer: Self-pay | Admitting: Cardiovascular Disease

## 2023-08-07 ENCOUNTER — Ambulatory Visit: Payer: Medicaid Other | Attending: Cardiovascular Disease | Admitting: Cardiovascular Disease

## 2023-08-07 VITALS — BP 118/60 | HR 72 | Ht 62.0 in | Wt 124.2 lb

## 2023-08-07 DIAGNOSIS — I255 Ischemic cardiomyopathy: Secondary | ICD-10-CM | POA: Diagnosis not present

## 2023-08-07 DIAGNOSIS — I5022 Chronic systolic (congestive) heart failure: Secondary | ICD-10-CM

## 2023-08-07 NOTE — Progress Notes (Signed)
Electrophysiology Office Note:    Date:  08/07/2023   ID:  Whitney May, DOB 12/28/1960, MRN [id number]  PCP:  Shelby Dubin, FNP   [facility] Providers Cardiologist:  Marca Ancona, MD     Referring MD: Marcelino Duster, PA   History of Present Illness:    Whitney May is a 62 y.o. female with a medical history significant for coronary disease with MI in 2019, RCA stent, referred for CRT-D placement.     She was diagnosed with coronary disease and heart failure in 2019 at the time of an admission for NSTEMI.  She has been under care of cardiology since that time and has had at least 1 additional NSTEMI.  She recently had a coronary angiogram in June 2020 that showed occlusion of her prior RCA stent with collateral filling of the PDA.    She has significant fatigue due to her heart failure.  She has difficulty functioning at work, which involves some walking and lifting boxes     Today, she reports she is at baseline.  She has not had any syncope.  EKGs/Labs/Other Studies Reviewed Today:     Echocardiogram:  TTE 04/13/2023 EF 20 to 25%.  TTE January 08, 2023 EF 25 to 30%  Monitors:   Stress testing:   Advanced imaging:   Cardiac catherization  April 16, 2023 - reviewed in epic Of note, 100% stenosis noted of overlapping stents in the distal RCA.  The right PDA system filled by left-to-right collaterals  EKG:   EKG Interpretation Date/Time:  Tuesday August 07 2023 13:59:03 EDT Ventricular Rate:  72 PR Interval:  228 QRS Duration:  146 QT Interval:  460 QTC Calculation: 503 R Axis:   -68  Text Interpretation: Sinus rhythm with 1st degree A-V block Left axis deviation Left bundle branch block When compared with ECG of 13-Jul-2023 11:05, PR interval has increased T wave inversion no longer evident in Inferior leads Confirmed by York Pellant [id number]) on 08/07/2023 2:00:24 PM     Physical Exam:    VS:  BP 118/60 (BP Location: Left Arm,  Patient Position: Sitting, Cuff Size: Normal)   Pulse 72   Ht 5\' 2"  (1.575 m)   Wt 124 lb 3.2 oz (56.3 kg)   SpO2 97%   BMI 22.72 kg/m     Wt Readings from Last 3 Encounters:  08/07/23 124 lb 3.2 oz (56.3 kg)  07/13/23 130 lb 12.8 oz (59.3 kg)  07/05/23 114 lb (51.7 kg)     GEN: Well nourished, well developed in no acute distress CARDIAC: RRR, no murmurs, rubs, gallops RESPIRATORY:  Normal work of breathing MUSCULOSKELETAL: no edema    ASSESSMENT & PLAN:     CHFrEF EF is persistently less than 30% despite GDMT for over 90 days She meets criteria for primary prevention ICD with CRT due to left bundle branch block Continue Jardiance 10 mg, Lasix 20 mg, Imdur 30 mg, Cozaar 25 mg, bisoprolol 2.5 mg  I discussed the indication for the procedure and the logistics, risks, potential benefit, and after care. I specifically explained that risks include but are not limited to infection, bleeding,damage to blood vessels, lung, and the heart -- but risk of prolonged hospitalization, need for surgery, or the event of stroke, heart attack, or death are low but not zero.    CAD  S/p multiple MIs, PCI Continue Ranexa 500 mg, Crestor 40 mg, aspirin 81 mg She will need to hold Brilinta briefly before  the procedure  LBBB Will plan to place CRT device     Signed, Maurice Small, MD  08/07/2023 2:30 PM    [facility]

## 2023-08-07 NOTE — Patient Instructions (Signed)
Medication Instructions:  Your physician recommends that you continue on your current medications as directed. Please refer to the Current Medication list given to you today. *If you need a refill on your cardiac medications before your next appointment, please call your pharmacy*   Testing/Procedures: BiV ICD implant Your physician has recommended that you have a defibrillator inserted. An implantable cardioverter defibrillator (ICD) is a small device that is placed in your chest or, in rare cases, your abdomen. This device uses electrical pulses or shocks to help control life-threatening, irregular heartbeats that could lead the heart to suddenly stop beating (sudden cardiac arrest). Leads are attached to the ICD that goes into your heart. This is done in the hospital and usually requires an overnight stay. Please see the instruction sheet given to you today for more information.   Follow-Up: At [facility], you and your health needs are our priority.  As part of our continuing mission to provide you with exceptional heart care, we have created designated Provider Care Teams.  These Care Teams include your primary Cardiologist (physician) and Advanced Practice Providers (APPs -  Physician Assistants and Nurse Practitioners) who all work together to provide you with the care you need, when you need it.  We recommend signing up for the patient portal called "MyChart".  Sign up information is provided on this After Visit Summary.  MyChart is used to connect with patients for Virtual Visits (Telemedicine).  Patients are able to view lab/test results, encounter notes, upcoming appointments, etc.  Non-urgent messages can be sent to your provider as well.   To learn more about what you can do with MyChart, go to [email or web address].    Your next appointment:   Please contact our office once you are ready to schedule a date for implant  Provider:   York Pellant, MD

## 2023-08-08 ENCOUNTER — Other Ambulatory Visit (HOSPITAL_COMMUNITY): Payer: Self-pay

## 2023-08-17 ENCOUNTER — Other Ambulatory Visit (HOSPITAL_COMMUNITY): Payer: Self-pay

## 2023-08-17 ENCOUNTER — Other Ambulatory Visit: Payer: Self-pay

## 2023-08-20 ENCOUNTER — Other Ambulatory Visit: Payer: Self-pay

## 2023-08-28 ENCOUNTER — Other Ambulatory Visit (HOSPITAL_COMMUNITY): Payer: Self-pay | Admitting: Cardiology

## 2023-08-28 DIAGNOSIS — I5022 Chronic systolic (congestive) heart failure: Secondary | ICD-10-CM

## 2023-08-28 DIAGNOSIS — I2510 Atherosclerotic heart disease of native coronary artery without angina pectoris: Secondary | ICD-10-CM

## 2023-08-28 MED ORDER — EMPAGLIFLOZIN 10 MG PO TABS
10.0000 mg | ORAL_TABLET | Freq: Every day | ORAL | 6 refills | Status: DC
Start: 2023-08-28 — End: 2024-04-17

## 2023-08-28 MED ORDER — TICAGRELOR 90 MG PO TABS: 90.0000 mg | ORAL_TABLET | Freq: Two times a day (BID) | ORAL | 3 refills | Status: DC

## 2023-08-29 ENCOUNTER — Other Ambulatory Visit (HOSPITAL_COMMUNITY): Payer: Self-pay

## 2023-08-29 ENCOUNTER — Telehealth (HOSPITAL_COMMUNITY): Payer: Self-pay

## 2023-08-29 NOTE — Telephone Encounter (Signed)
Advanced Heart Failure Patient Advocate Encounter  Prior authorization for [place] has been submitted and approved. Test billing returns $4 for 90 day supply.  Key: [id number] Effective: 08/15/2023 to 08/28/2024  Burnell Blanks, CPhT Rx Patient Advocate Phone: [phone]

## 2023-09-07 ENCOUNTER — Other Ambulatory Visit (HOSPITAL_COMMUNITY): Payer: Self-pay | Admitting: Cardiology

## 2023-09-07 ENCOUNTER — Other Ambulatory Visit (HOSPITAL_COMMUNITY): Payer: Self-pay

## 2023-09-07 DIAGNOSIS — I2510 Atherosclerotic heart disease of native coronary artery without angina pectoris: Secondary | ICD-10-CM

## 2023-09-07 DIAGNOSIS — I5022 Chronic systolic (congestive) heart failure: Secondary | ICD-10-CM

## 2023-09-13 ENCOUNTER — Other Ambulatory Visit (HOSPITAL_COMMUNITY): Payer: Self-pay

## 2023-09-13 ENCOUNTER — Other Ambulatory Visit (HOSPITAL_COMMUNITY): Payer: Self-pay | Admitting: Family Medicine

## 2023-09-13 DIAGNOSIS — I2510 Atherosclerotic heart disease of native coronary artery without angina pectoris: Secondary | ICD-10-CM

## 2023-09-13 DIAGNOSIS — I5022 Chronic systolic (congestive) heart failure: Secondary | ICD-10-CM

## 2023-09-14 ENCOUNTER — Other Ambulatory Visit (HOSPITAL_COMMUNITY): Payer: Self-pay

## 2023-09-14 ENCOUNTER — Telehealth (HOSPITAL_COMMUNITY): Payer: Self-pay | Admitting: Pharmacy Technician

## 2023-09-14 NOTE — Telephone Encounter (Signed)
Advanced Heart Failure Patient Advocate Encounter  Received message that patient was still having a hard time getting Jardiance. Test claim shows that co-pay is $4. [facility] drug, they were able to run the medication through without issues. Called the patient and left vm stating they were filling it now. Advised her to call back with problems.  Archer Asa, CPhT

## 2023-09-17 ENCOUNTER — Other Ambulatory Visit (HOSPITAL_COMMUNITY): Payer: Self-pay

## 2023-09-18 ENCOUNTER — Other Ambulatory Visit (HOSPITAL_COMMUNITY): Payer: Self-pay

## 2023-09-21 ENCOUNTER — Encounter (HOSPITAL_COMMUNITY): Payer: Medicaid Other | Admitting: Cardiology

## 2023-09-26 ENCOUNTER — Other Ambulatory Visit (HOSPITAL_COMMUNITY): Payer: Self-pay

## 2023-10-02 ENCOUNTER — Other Ambulatory Visit (HOSPITAL_COMMUNITY): Payer: Self-pay

## 2023-10-10 ENCOUNTER — Other Ambulatory Visit (HOSPITAL_COMMUNITY): Payer: Self-pay

## 2023-10-17 ENCOUNTER — Telehealth (HOSPITAL_COMMUNITY): Payer: Self-pay

## 2023-10-17 NOTE — Telephone Encounter (Signed)
Called and left patient a voice message to confirm/remind patient of their appointment at the [facility] on 10/18/23.   And to bring in all medications and/or complete list.

## 2023-10-18 ENCOUNTER — Encounter (HOSPITAL_COMMUNITY): Payer: Self-pay

## 2023-10-18 ENCOUNTER — Ambulatory Visit (HOSPITAL_COMMUNITY)
Admission: RE | Admit: 2023-10-18 | Discharge: 2023-10-18 | Disposition: A | Discharge status: Home / Self Care | Payer: Medicaid Other | Source: Ambulatory Visit | Attending: Physician Assistant | Admitting: Physician Assistant

## 2023-10-18 ENCOUNTER — Other Ambulatory Visit: Payer: Self-pay

## 2023-10-18 VITALS — BP 102/72 | HR 63 | Wt 135.6 lb

## 2023-10-18 DIAGNOSIS — I739 Peripheral vascular disease, unspecified: Secondary | ICD-10-CM | POA: Diagnosis not present

## 2023-10-18 DIAGNOSIS — R9431 Abnormal electrocardiogram [ECG] [EKG]: Secondary | ICD-10-CM | POA: Insufficient documentation

## 2023-10-18 DIAGNOSIS — Z7902 Long term (current) use of antithrombotics/antiplatelets: Secondary | ICD-10-CM | POA: Diagnosis not present

## 2023-10-18 DIAGNOSIS — E785 Hyperlipidemia, unspecified: Secondary | ICD-10-CM | POA: Diagnosis not present

## 2023-10-18 DIAGNOSIS — I252 Old myocardial infarction: Secondary | ICD-10-CM | POA: Insufficient documentation

## 2023-10-18 DIAGNOSIS — I447 Left bundle-branch block, unspecified: Secondary | ICD-10-CM | POA: Diagnosis not present

## 2023-10-18 DIAGNOSIS — I25118 Atherosclerotic heart disease of native coronary artery with other forms of angina pectoris: Secondary | ICD-10-CM

## 2023-10-18 DIAGNOSIS — I5022 Chronic systolic (congestive) heart failure: Secondary | ICD-10-CM | POA: Insufficient documentation

## 2023-10-18 DIAGNOSIS — Z955 Presence of coronary angioplasty implant and graft: Secondary | ICD-10-CM | POA: Insufficient documentation

## 2023-10-18 DIAGNOSIS — F17210 Nicotine dependence, cigarettes, uncomplicated: Secondary | ICD-10-CM | POA: Insufficient documentation

## 2023-10-18 DIAGNOSIS — R5383 Other fatigue: Secondary | ICD-10-CM | POA: Insufficient documentation

## 2023-10-18 DIAGNOSIS — E039 Hypothyroidism, unspecified: Secondary | ICD-10-CM | POA: Insufficient documentation

## 2023-10-18 DIAGNOSIS — I2510 Atherosclerotic heart disease of native coronary artery without angina pectoris: Secondary | ICD-10-CM | POA: Insufficient documentation

## 2023-10-18 DIAGNOSIS — I255 Ischemic cardiomyopathy: Secondary | ICD-10-CM | POA: Diagnosis not present

## 2023-10-18 DIAGNOSIS — I5042 Chronic combined systolic (congestive) and diastolic (congestive) heart failure: Secondary | ICD-10-CM | POA: Diagnosis not present

## 2023-10-18 LAB — BASIC METABOLIC PANEL
Anion gap: 10 (ref 5–15)
BUN: 8 mg/dL (ref 6–20)
CO2: 20 mmol/L — ABNORMAL LOW (ref 22–32)
Calcium: 9.1 mg/dL (ref 8.9–10.3)
Chloride: 107 mmol/L (ref 98–111)
Creatinine, Ser: 0.87 mg/dL (ref 0.44–1.00)
GFR, Estimated: >60 mL/min (ref >60)
Glucose, Bld: 85 mg/dL (ref 70–99)
Potassium: 3.9 mmol/L (ref 3.5–5.1)
Sodium: 137 mmol/L (ref 135–145)

## 2023-10-18 LAB — BRAIN NATRIURETIC PEPTIDE: B Natriuretic Peptide: 138.6 pg/mL — ABNORMAL HIGH (ref 0.0–100.0)

## 2023-10-18 MED ORDER — SPIRONOLACTONE 25 MG PO TABS
12.5000 mg | ORAL_TABLET | Freq: Every day | ORAL | 3 refills | Status: DC
  Filled 2023-10-18: qty 45, 90d supply, fill #0

## 2023-10-18 NOTE — Progress Notes (Signed)
ADVANCED HF CLINIC  NOTE  PCP: Shelby Dubin, FNP HF Cardiologist: Dr. Shirlee Latch  HPI: [name] is a 62 y.o. female with hx of CAD with NSTEMI in 2019 with stent to RCA and then in 2020 NSTEMI with plaque rupture of RCA in-stent with 2 overlying stents to RCA, hypothyroidism, HLD, PAD, tobacco use, ETOH and ischemic cardiomyopathy EF 30-35% on 5/20 echo who is being seen 09/18/2022 for the evaluation of STEMI with ischemic cardiomyopathy. She has a history of PAD with long occlusion left SFA.    Admitted 11/23 with CP, ran out of Plavix for several days. EKG showed LBBB and prominent inferior ST elevation. She had emergent cath for inferior STEMI with 90% ISR mid-distal RCA, 99% stenosis ostial PDA and 95% stenosis ostial PLV. Patient had PTCA to the long mid-distal RCA stented segment and DES to ostial PLV and PDA.  LV-gram showed EF in 20% range. Echo 09/19/22 showed EF 20-25%, mod eccentric LVH, severe hypokinesis of the LV entire lateral wall. Normal RV function and size. Mild MR, trivial TR. GDMT titrated and discharged home with LifeVest, weight 110 lbs.  Echo 3/24 showed EF 25-30% with septal-lateral dyssynchrony, mild RV dysfunction. No longer wearing LifeVest. Awaiting Medicaid so she can get coverage for CRT-D.  Admitted 6/24 with NSTEMI. Transiently required NE for hypotension. Ltd Echo showed EF 20-25%, RV mildly reduced. LHC showed ISR in distal RCA, and new stenosis in proximal RCA, planned medical management. Ranexa started. Cleda Daub and losartan held due to soft BP, she was discharged home, weight 125 lbs.  Follow up 8/24 with PharmD. Weight down 19 lbs in setting of taking PRN lasix daily. Labs showed K 2.3 and SCr 1.15. K repleted and instructed to stop Lasix.  Today she returns for HF follow up. Has seen EP for CRT-D insertion, but incidentally had an oral infection at the time and was instructed to follow up with PCP and then call EP to schedule. She says that the infection has  resolved and she needs to give them a call. Overall feeling fine. Fatigue. Orthostatic/dizziness with bending over. CP at night. 2 pillows up. Having runny nose and post nasal drip with weather change. Denies increasing SOB, CP requiring SL nirtoglycerin 2 nights ago. No edema. Appetite ok. No fever or chills. Taking all medications. Has not needed her PRN lasix. Weight at home 132, reports caloric weight gain over the past couple of months.   Labs (11/23): K 4.1, creatinine 0.66, hgb 11.3, Lp(a) 9.1, LDL 69 Labs (12/23): K 3.8, Creatinine 0.8 Labs (1/24): K 3.7, creatinine 0.83 Labs (3/24): LDL 16 Labs (6/24): K 5.2, creatinine 0.80 Labs (8/24):  K 2.3, creatinine 1.15 Labs (9/24): K 3.6, creatinine 0.9, ferritin 32 Labs today  ECG (personally reviewed): SR 63 bpm with 1deg AVB, PR 220 ms , LBBB, QRS 146 ms  PMH: 1. Hypothyroidism 2. H/o heavy ETOH 3. Active smoker 4. CAD: NSTEMI 2019 with PCI to RCA.  - NSTEMI 2020 with stents to RCA.  - Inferior STEMI (11/23): 90% ISR mid-distal RCA, 99% stenosis ostial PDA and 95% stenosis ostial PLV. Patient had PTCA to the long mid-distal RCA stented segment and DES to ostial PLV and PDA.   - LHC (6/24):  ISR in distal RCA, and new stenosis in proximal RCA. Plan for medical management. 5. Chronic systolic CHF: Ischemic cardiomyopathy.  - Echo (5/20) with EF 30-35%.  - LV-gram 11/23 with EF 20% - Echo (3/24): EF 25-30%, septal-lateral dyssynchrony, mildly decreased  RV systolic function. L - Ltd Echo (6/24): EF 20-25%, RV mildly reduced.  6. PAD: Long occlusion left SFA 7. LBBB  Current Outpatient Medications  Medication Sig Dispense Refill   aspirin 81 MG chewable tablet Chew 1 tablet (81 mg total) by mouth daily. 30 tablet 6   bisoprolol (ZEBETA) 5 MG tablet Take 1/2 tablets (2.5 mg total) by mouth daily. 15 tablet 6   empagliflozin (JARDIANCE) 10 MG TABS tablet Take 1 tablet (10 mg total) by mouth daily before breakfast. 30 tablet 6    isosorbide mononitrate (IMDUR) 30 MG 24 hr tablet Take 1 tablet (30 mg total) by mouth daily. 30 tablet 6   levothyroxine (SYNTHROID) 100 MCG tablet Take 1 tablet (100 mcg total) by mouth daily at 6 (six) AM. 30 tablet 0   losartan (COZAAR) 25 MG tablet Take 0.5 tablets (12.5 mg total) by mouth daily. 15 tablet 6   Multiple Vitamin (MULTIVITAMIN WITH MINERALS) TABS tablet Take 1 tablet by mouth daily. 30 tablet 0   nitroGLYCERIN (NITROSTAT) 0.4 MG SL tablet Place 1 tablet (0.4 mg total) under the tongue every 5 (five) minutes x 3 doses as needed for chest pain. 25 tablet 12   Omega-3 Fatty Acids (FISH OIL) 1200 MG CAPS Take 1,200 mg by mouth daily.     potassium chloride SA (KLOR-CON M) 20 MEQ tablet Take 1 tablet (20 mEq total) by mouth daily. 30 tablet 3   ranolazine (RANEXA) 500 MG 12 hr tablet Take 1 tablet (500 mg total) by mouth 2 (two) times daily. 60 tablet 6   rosuvastatin (CRESTOR) 40 MG tablet Take 1 tablet (40 mg total) by mouth daily. 30 tablet 6   ticagrelor (BRILINTA) 90 MG TABS tablet Take 1 tablet (90 mg total) by mouth 2 (two) times daily. 180 tablet 3   furosemide (LASIX) 20 MG tablet Take 1 tablet (20 mg total) by mouth daily as needed for weight gain of 3 lbs in 24 hours or 5 lbs in a week 30 tablet 6   No current facility-administered medications for this encounter.   Allergies  Allergen Reactions   Penicillins Other (See Comments)    Fever, vomiting Has patient had a PCN reaction causing immediate rash, facial/tongue/throat swelling, SOB or lightheadedness with hypotension: NO Has patient had a PCN reaction causing severe rash involving mucus membranes or skin necrosis: NO Has patient had a PCN reaction that required hospitalization: NO Has patient had a PCN reaction occurring within the last 10 years: NO If all of the above answers are "NO", then may proceed Childhood allergy   Social History   Socioeconomic History   Marital status: Married    Spouse name: Not on  file   Number of children: Not on file   Years of education: Not on file   Highest education level: Not on file  Occupational History   Not on file  Tobacco Use   Smoking status: Former    Current packs/day: 0.00    Average packs/day: 1 pack/day for 22.0 years (22.0 ttl pk-yrs)    Types: Cigarettes    Start date: 08/09/1996    Quit date: 08/09/2018    Years since quitting: 5.1   Smokeless tobacco: Never  Substance and Sexual Activity   Alcohol use: Yes    Alcohol/week: 8.0 standard drinks of alcohol    Types: 8 Glasses of wine per week   Drug use: No   Sexual activity: Not on file  Other Topics Concern  Not on file  Social History Narrative   Married   Gets regular exercise   Social Drivers of Health   Financial Resource Strain: Not on file  Food Insecurity: No Food Insecurity (04/13/2023)   Hunger Vital Sign    Worried About Running Out of Food in the Last Year: Never true    Ran Out of Food in the Last Year: Never true  Transportation Needs: No Transportation Needs (04/13/2023)   PRAPARE - Administrator, Civil Service (Medical): No    Lack of Transportation (Non-Medical): No  Physical Activity: Not on file  Stress: Not on file  Social Connections: Unknown (03/18/2022)   [facility]   Social Network    Social Network: Not on file  Intimate Partner Violence: Not At Risk (04/13/2023)   Humiliation, Afraid, Rape, and Kick questionnaire    Fear of Current or Ex-Partner: No    Emotionally Abused: No    Physically Abused: No    Sexually Abused: No   Family History  Problem Relation Age of Onset   Coronary artery disease Mother    Coronary artery disease Maternal Grandmother    Hypertension Father    Diabetes Father    BP 102/72   Pulse 63   Wt 61.5 kg (135 lb 9.6 oz)   SpO2 94%   BMI 24.80 kg/m   Wt Readings from Last 3 Encounters:  10/18/23 61.5 kg (135 lb 9.6 oz)  08/07/23 56.3 kg (124 lb 3.2 oz)  07/13/23 59.3 kg  (130 lb 12.8 oz)   PHYSICAL EXAM: General: Well appearing. No distress on RA, walked into clinic HEENT: neck supple.   Cardiac: JVP not visible. S1 and S2 present. No murmurs or rub. Resp: Lung sounds clear, diminished in bases Abdomen: Soft, non-tender, non-distended. + BS. Extremities: Warm and dry. No rash, cyanosis.  No edema.  Neuro: Alert and oriented x3. Affect pleasant. Moves all extremities without difficulty.  ASSESSMENT & PLAN: 1. CAD: H/o NSTEMI involving RCA in 2019 and 2020.  Ran out of Plavix, presented with inferior STEMI in 11/23.  She had successful stenting of the ostium of the PLV and PDA branches and PTCA of the entire previously stented RCA. Admitted 6/24 with CP, LHC showed ISR distal RCA, and new stenosis in proximal RCA. Plan for medical management. Continues to have intermittent stable angina in the evenings, resolves with SL nitroglycerin. Reports requiring less frequently - Continue Imdur 30 mg daily. - Continue Ranexa 500 mg bid, QTc 493 msec on ECG today. - Continue ASA + ticagrelor.  - Continue Crestor 40 mg daily. Good LDL (3/24). Repeat at follow up appt.  2. Chronic systolic CHF: Ischemic cardiomyopathy.  Echo in 5/20 with EF 30-35%. LV-gram at time of cath 11/23 showed EF down to 20%.  Echo 09/19/22 showed EF 20-25%, mod eccentric LVH, severe hypokinesis of the LV entire lateral wall. Normal RV function and size. Mild MR, trivial TR. Echo 3/24 showed EF 25-30% with septal-lateral dyssynchrony, mild RV dysfunction. Ltd echo (6/24) EF 20-25%, RV mildly reduced. ECG today with LBBB QRS 146 ms, CRT-D planning underway. NYHA class IIb-III, she not volume overloaded on exam. GDMT has been limited by soft BP and dizziness.  - Start spiro 12.5 mg daily. BMET/BNP today. Repeat BMET in 7-10 days. - Continue bisoprolol 2.5 mg daily (more cardio selective in setting of active smoker) - Continue losartan 12.5 mg daily. BMET/BNP today. - Continue Jardiance 10 mg daily.  -  She has PRN lasix at home, she does not currently need this. BNP today - Continue KCL 20 daily - Has LBBB and persistently low EF from ischemic cardiomyopathy.  Seen by EP for CRT-D postponed d/t dental infection, instructed on importance and that she needs to call EP for scheduling now that infection has resolved.  - She refuses to wear Lifevest any longer.  3. Hypothyroidism: Per PCP.  She is on levothyroxine. 4. Smoking: Discussed smoking cessation.  - Has cut back from 1ppd to 1/2 ppd. 5. PAD: History of long occlusion left SFA.  Has Left calf claudication with long distance walking.  No rest pain or pedal ulcerations.  6. Fatigue: likely multifactorial. Ferretin 36, Tsat 9 - Referred for Fe transfusion. She has been attempted to be contacted to schedule appointment. Instructed to give them a call to schedule appointment   Follow up in 2 months with APP.  [place] Chazlyn Cude, NP 10/18/2023

## 2023-10-18 NOTE — Patient Instructions (Signed)
START Spironolactone 12.5mg  daily  Routine lab work today. Will notify you of abnormal results  Repeat labs in 7 days  Follow up in 2 months  Do the following things EVERYDAY: Weigh yourself in the morning before breakfast. Write it down and keep it in a log. Take your medicines as prescribed Eat low salt foods--Limit salt (sodium) to 2000 mg per day.  Stay as active as you can everyday Limit all fluids for the day to less than 2 liters

## 2023-10-22 ENCOUNTER — Telehealth (HOSPITAL_COMMUNITY): Payer: Self-pay | Admitting: *Deleted

## 2023-10-22 NOTE — Addendum Note (Signed)
Addended by: Hessie Diener B on: 10/22/2023 11:44 AM   Modules accepted: Orders

## 2023-10-22 NOTE — Telephone Encounter (Signed)
Called patient per Whitney Lee, NP with following lab results and instructions:  "K 3.9.  Will need to stop KCL 20 daily with starting Spirolactone. Repeat labs in 7-10 days as planned."  Pt says she "has not received" her Cleda Daub as of yet. She is waiting on delivery from [facility]. Asked patient to call pharmacy and check on delivery date. She will stop potassium supplement when she starts Cleda Daub and will get repeat labs at [facility] 7 - 10 days after medication change.

## 2023-10-23 ENCOUNTER — Other Ambulatory Visit (HOSPITAL_COMMUNITY): Payer: Self-pay

## 2023-10-26 ENCOUNTER — Telehealth: Payer: Self-pay

## 2023-10-26 NOTE — Telephone Encounter (Signed)
Spoke with patient about scheduling her BiV ICD, patient states she's not ready currently but will reach out to our office when she is ready. No further needs at this time

## 2023-11-06 ENCOUNTER — Other Ambulatory Visit (HOSPITAL_COMMUNITY): Payer: Self-pay

## 2023-11-06 ENCOUNTER — Other Ambulatory Visit (HOSPITAL_BASED_OUTPATIENT_CLINIC_OR_DEPARTMENT_OTHER): Payer: Self-pay

## 2023-11-10 ENCOUNTER — Other Ambulatory Visit (HOSPITAL_COMMUNITY): Payer: Self-pay

## 2023-11-11 ENCOUNTER — Other Ambulatory Visit: Payer: Self-pay

## 2023-11-12 ENCOUNTER — Other Ambulatory Visit (HOSPITAL_COMMUNITY): Payer: Self-pay

## 2023-11-14 ENCOUNTER — Other Ambulatory Visit (HOSPITAL_COMMUNITY): Payer: Self-pay

## 2023-11-20 ENCOUNTER — Other Ambulatory Visit: Payer: Self-pay

## 2023-11-20 ENCOUNTER — Other Ambulatory Visit (HOSPITAL_COMMUNITY): Payer: Self-pay

## 2023-11-22 ENCOUNTER — Other Ambulatory Visit: Payer: Self-pay

## 2023-11-22 ENCOUNTER — Telehealth: Payer: Self-pay

## 2023-11-22 DIAGNOSIS — Z122 Encounter for screening for malignant neoplasm of respiratory organs: Secondary | ICD-10-CM

## 2023-11-22 DIAGNOSIS — F17210 Nicotine dependence, cigarettes, uncomplicated: Secondary | ICD-10-CM

## 2023-11-22 DIAGNOSIS — Z87891 Personal history of nicotine dependence: Secondary | ICD-10-CM

## 2023-11-22 NOTE — Telephone Encounter (Signed)
Attempted to contact patient to see if she was wanting to move forward with BiV ICD implant yet. Left voicemail to call our office back

## 2023-11-22 NOTE — Telephone Encounter (Signed)
.  Lung Cancer Screening Narrative/Criteria Questionnaire (Cigarette Smokers Only- No Cigars/Pipes/vapes)   Whitney May   SDMV:11/28/2023 @ 9:15am with Darla Lesches, RN   09/18/1961   LDCT: 12/04/2023 @ 10:30 am @ [facility]    62 y.o.   Phone: [phone]  Lung Screening Narrative (confirm age 51-77 yrs Medicare / 50-80 yrs Private pay insurance)   Insurance information:Medicaid   Referring Provider:[place]   This screening involves an initial phone call with a team member from our program. It is called a shared decision making visit. The initial meeting is required by  insurance and Medicare to make sure you understand the program. This appointment takes about 15-20 minutes to complete. You will complete the screening scan at your scheduled date/time.  This scan takes about 5-10 minutes to complete. You can eat and drink normally before and after the scan.  Criteria questions for Lung Cancer Screening:   Are you a current or former smoker? Current Age began smoking: 24   If you are a former smoker, what year did you quit smoking? (within 15 yrs)   To calculate your smoking history, I need an accurate estimate of how many packs of cigarettes you smoked per day and for how many years. (Not just the number of PPD you are now smoking)   Years smoking 38 x Packs per day 2 = Pack years 67   (at least 20 pack yrs)   (Make sure they understand that we need to know how much they have smoked in the past, not just the number of PPD they are smoking now)  Do you have a personal history of cancer?  No    Do you have a family history of cancer? Yes  (cancer type and and relative) Mother spine/all over, maternal Grandmother  lung  Are you coughing up blood?  No  Have you had unexplained weight loss of 15 lbs or more in the last 6 months? No  It looks like you meet all criteria.  When would be a good time for [place] to schedule you for this screening?   Additional information

## 2023-11-28 ENCOUNTER — Encounter: Payer: Self-pay | Admitting: Adult Health

## 2023-11-28 ENCOUNTER — Ambulatory Visit: Payer: Medicaid Other | Admitting: Adult Health

## 2023-11-28 DIAGNOSIS — F17210 Nicotine dependence, cigarettes, uncomplicated: Secondary | ICD-10-CM

## 2023-11-28 NOTE — Progress Notes (Signed)
  Virtual Visit via Telephone Note  I connected with Whitney May , 11/28/23 9:20 AM by a telemedicine application and verified that I am speaking with the correct person using two identifiers.  Location: Patient: home Provider: home   I discussed the limitations of evaluation and management by telemedicine and the availability of in person appointments. The patient expressed understanding and agreed to proceed.   Shared Decision Making Visit Lung Cancer Screening Program [id number])   Eligibility: 63 y.o. Pack Years Smoking History Calculation = 36 pack years (# packs/per year x # years smoked) Recent History of coughing up blood  no Unexplained weight loss? no ( >Than 15 pounds within the last 6 months ) Prior History Lung / other cancer no (Diagnosis within the last 5 years already requiring surveillance chest CT Scans). Smoking Status Current Smoker  Visit Components: Discussion included one or more decision making aids. YES Discussion included risk/benefits of screening. YES Discussion included potential follow up diagnostic testing for abnormal scans. YES Discussion included meaning and risk of over diagnosis. YES Discussion included meaning and risk of False Positives. YES Discussion included meaning of total radiation exposure. YES  Counseling Included: Importance of adherence to annual lung cancer LDCT screening. YES Impact of comorbidities on ability to participate in the program. YES Ability and willingness to under diagnostic treatment. YES  Smoking Cessation Counseling: Current Smokers:  Discussed importance of smoking cessation. yes Information about tobacco cessation classes and interventions provided to patient. yes Patient provided with "ticket" for LDCT Scan. yes Symptomatic Patient. NO Diagnosis Code: Tobacco Use Z72.0 Asymptomatic Patient yes  Counseling (Intermediate counseling: > three minutes counseling) B1478   Z12.2-Screening of respiratory  organs Z87.891-Personal history of nicotine dependence   Danford Bad 11/28/23

## 2023-11-28 NOTE — Patient Instructions (Signed)

## 2023-11-29 ENCOUNTER — Ambulatory Visit (HOSPITAL_COMMUNITY)
Admission: RE | Admit: 2023-11-29 | Discharge: 2023-11-29 | Disposition: A | Discharge status: Home / Self Care | Payer: Medicaid Other | Source: Ambulatory Visit | Attending: Family Medicine | Admitting: Family Medicine

## 2023-11-29 DIAGNOSIS — J432 Centrilobular emphysema: Secondary | ICD-10-CM | POA: Insufficient documentation

## 2023-11-29 DIAGNOSIS — I700 Atherosclerosis of aorta: Secondary | ICD-10-CM | POA: Insufficient documentation

## 2023-11-29 DIAGNOSIS — Z122 Encounter for screening for malignant neoplasm of respiratory organs: Secondary | ICD-10-CM | POA: Insufficient documentation

## 2023-11-29 DIAGNOSIS — F17210 Nicotine dependence, cigarettes, uncomplicated: Secondary | ICD-10-CM | POA: Diagnosis present

## 2023-11-29 DIAGNOSIS — I2510 Atherosclerotic heart disease of native coronary artery without angina pectoris: Secondary | ICD-10-CM | POA: Insufficient documentation

## 2023-11-29 DIAGNOSIS — Z87891 Personal history of nicotine dependence: Secondary | ICD-10-CM | POA: Diagnosis present

## 2023-11-30 ENCOUNTER — Other Ambulatory Visit (HOSPITAL_COMMUNITY): Payer: Self-pay

## 2023-11-30 ENCOUNTER — Other Ambulatory Visit: Payer: Self-pay | Admitting: Acute Care

## 2023-11-30 DIAGNOSIS — Z122 Encounter for screening for malignant neoplasm of respiratory organs: Secondary | ICD-10-CM

## 2023-11-30 DIAGNOSIS — Z87891 Personal history of nicotine dependence: Secondary | ICD-10-CM

## 2023-11-30 DIAGNOSIS — F17210 Nicotine dependence, cigarettes, uncomplicated: Secondary | ICD-10-CM

## 2023-12-03 ENCOUNTER — Other Ambulatory Visit (HOSPITAL_COMMUNITY): Payer: Self-pay

## 2023-12-18 ENCOUNTER — Telehealth (HOSPITAL_COMMUNITY): Payer: Self-pay

## 2023-12-18 ENCOUNTER — Telehealth: Payer: Self-pay

## 2023-12-18 NOTE — Telephone Encounter (Signed)
Attempted to call patient to confirm appointment. Unable to reach, left message with appointment details.

## 2023-12-18 NOTE — Telephone Encounter (Signed)
Attempted to contact patient, left message to call our office back. Ready to schedule BiV ICD???

## 2023-12-19 ENCOUNTER — Encounter (HOSPITAL_COMMUNITY): Payer: Medicaid Other

## 2023-12-19 NOTE — Progress Notes (Incomplete)
[facility]  NOTE  PCP: Shelby Dubin, FNP HF Cardiologist: Dr. Shirlee Latch  HPI: Whitney May is a 63 y.o. female with hx of CAD with NSTEMI in 2019 with stent to RCA and then in 2020 NSTEMI with plaque rupture of RCA in-stent with 2 overlying stents to RCA, hypothyroidism, HLD, PAD, tobacco use, ETOH and ischemic cardiomyopathy EF 30-35% on 5/20 echo who is being seen 09/18/2022 for the evaluation of STEMI with ischemic cardiomyopathy. She has a history of PAD with long occlusion left SFA.    Admitted 11/23 with CP, ran out of Plavix for several days. EKG showed LBBB and prominent inferior ST elevation. She had emergent cath for inferior STEMI with 90% ISR mid-distal RCA, 99% stenosis ostial PDA and 95% stenosis ostial PLV. Patient had PTCA to the long mid-distal RCA stented segment and DES to ostial PLV and PDA.  LV-gram showed EF in 20% range. Echo 09/19/22 showed EF 20-25%, mod eccentric LVH, severe hypokinesis of the LV entire lateral wall. Normal RV function and size. Mild MR, trivial TR. GDMT titrated and discharged home with LifeVest, weight 110 lbs.  Echo 3/24 showed EF 25-30% with septal-lateral dyssynchrony, mild RV dysfunction. No longer wearing LifeVest. Awaiting Medicaid so she can get coverage for CRT-D.  Admitted 6/24 with NSTEMI. Transiently required NE for hypotension. Ltd Echo showed EF 20-25%, RV mildly reduced. LHC showed ISR in distal RCA, and new stenosis in proximal RCA, planned medical management. Ranexa started. Cleda Daub and losartan held due to soft BP, she was discharged home, weight 125 lbs.  Follow up 8/24 with PharmD. Weight down 19 lbs in setting of taking PRN lasix daily. Labs showed K 2.3 and SCr 1.15. K repleted and instructed to stop Lasix.  Today she returns for HF follow up. Has seen EP for CRT-D insertion, but incidentally had an oral infection at the time and was instructed to follow up with PCP and then call EP to schedule. She says that the infection has  resolved and she needs to give them a call. Overall feeling fine. Fatigue. Orthostatic/dizziness with bending over. CP at night. 2 pillows up. Having runny nose and post nasal drip with weather change. Denies increasing SOB, CP requiring SL nirtoglycerin 2 nights ago. No edema. Appetite ok. No fever or chills. Taking all medications. Has not needed her PRN lasix. Weight at home 132, reports caloric weight gain over the past couple of months.   Labs (11/23): K 4.1, creatinine 0.66, hgb 11.3, Lp(a) 9.1, LDL 69 Labs (12/23): K 3.8, Creatinine 0.8 Labs (1/24): K 3.7, creatinine 0.83 Labs (3/24): LDL 16 Labs (6/24): K 5.2, creatinine 0.80 Labs (8/24):  K 2.3, creatinine 1.15 Labs (9/24): K 3.6, creatinine 0.9, ferritin 32 Labs today  ECG (personally reviewed): SR 63 bpm with 1deg AVB, PR 220 ms , LBBB, QRS 146 ms  PMH: 1. Hypothyroidism 2. H/o heavy ETOH 3. Active smoker 4. CAD: NSTEMI 2019 with PCI to RCA.  - NSTEMI 2020 with stents to RCA.  - Inferior STEMI (11/23): 90% ISR mid-distal RCA, 99% stenosis ostial PDA and 95% stenosis ostial PLV. Patient had PTCA to the long mid-distal RCA stented segment and DES to ostial PLV and PDA.   - LHC (6/24):  ISR in distal RCA, and new stenosis in proximal RCA. Plan for medical management. 5. Chronic systolic CHF: Ischemic cardiomyopathy.  - Echo (5/20) with EF 30-35%.  - LV-gram 11/23 with EF 20% - Echo (3/24): EF 25-30%, septal-lateral dyssynchrony, mildly decreased  RV systolic function. L - Ltd Echo (6/24): EF 20-25%, RV mildly reduced.  6. PAD: Long occlusion left SFA 7. LBBB  Current Outpatient Medications  Medication Sig Dispense Refill   aspirin 81 MG chewable tablet Chew 1 tablet (81 mg total) by mouth daily. 30 tablet 6   bisoprolol (ZEBETA) 5 MG tablet Take 1/2 tablets (2.5 mg total) by mouth daily. 15 tablet 6   empagliflozin (JARDIANCE) 10 MG TABS tablet Take 1 tablet (10 mg total) by mouth daily before breakfast. 30 tablet 6    furosemide (LASIX) 20 MG tablet Take 1 tablet (20 mg total) by mouth daily as needed for weight gain of 3 lbs in 24 hours or 5 lbs in a week 30 tablet 6   isosorbide mononitrate (IMDUR) 30 MG 24 hr tablet Take 1 tablet (30 mg total) by mouth daily. 30 tablet 6   levothyroxine (SYNTHROID) 100 MCG tablet Take 1 tablet (100 mcg total) by mouth daily at 6 (six) AM. 30 tablet 0   losartan (COZAAR) 25 MG tablet Take 0.5 tablets (12.5 mg total) by mouth daily. 15 tablet 6   Multiple Vitamin (MULTIVITAMIN WITH MINERALS) TABS tablet Take 1 tablet by mouth daily. 30 tablet 0   nitroGLYCERIN (NITROSTAT) 0.4 MG SL tablet Place 1 tablet (0.4 mg total) under the tongue every 5 (five) minutes x 3 doses as needed for chest pain. 25 tablet 12   Omega-3 Fatty Acids (FISH OIL) 1200 MG CAPS Take 1,200 mg by mouth daily.     ranolazine (RANEXA) 500 MG 12 hr tablet Take 1 tablet (500 mg total) by mouth 2 (two) times daily. 60 tablet 6   rosuvastatin (CRESTOR) 40 MG tablet Take 1 tablet (40 mg total) by mouth daily. 30 tablet 6   spironolactone (ALDACTONE) 25 MG tablet Take 0.5 tablets (12.5 mg total) by mouth daily. 45 tablet 3   ticagrelor (BRILINTA) 90 MG TABS tablet Take 1 tablet (90 mg total) by mouth 2 (two) times daily. 180 tablet 3   No current facility-administered medications for this visit.   Allergies  Allergen Reactions   Penicillins Other (See Comments)    Fever, vomiting Has patient had a PCN reaction causing immediate rash, facial/tongue/throat swelling, SOB or lightheadedness with hypotension: NO Has patient had a PCN reaction causing severe rash involving mucus membranes or skin necrosis: NO Has patient had a PCN reaction that required hospitalization: NO Has patient had a PCN reaction occurring within the last 10 years: NO If all of the above answers are "NO", then may proceed Childhood allergy   Social History   Socioeconomic History   Marital status: Married    Spouse name: Not on file    Number of children: Not on file   Years of education: Not on file   Highest education level: Not on file  Occupational History   Not on file  Tobacco Use   Smoking status: Former    Current packs/day: 0.00    Average packs/day: 1 pack/day for 22.0 years (22.0 ttl pk-yrs)    Types: Cigarettes    Start date: 08/09/1996    Quit date: 08/09/2018    Years since quitting: 5.3   Smokeless tobacco: Never  Substance and Sexual Activity   Alcohol use: Yes    Alcohol/week: 8.0 standard drinks of alcohol    Types: 8 Glasses of wine per week   Drug use: No   Sexual activity: Not on file  Other Topics Concern   Not on file  Social History Narrative   Married   Gets regular exercise   Social Drivers of Health   Financial Resource Strain: Not on file  Food Insecurity: No Food Insecurity (04/13/2023)   Hunger Vital Sign    Worried About Running Out of Food in the Last Year: Never true    Ran Out of Food in the Last Year: Never true  Transportation Needs: No Transportation Needs (04/13/2023)   PRAPARE - Administrator, Civil Service (Medical): No    Lack of Transportation (Non-Medical): No  Physical Activity: Not on file  Stress: Not on file  Social Connections: Unknown (03/18/2022)   Received from [facility], [facility]   Social Network    Social Network: Not on file  Intimate Partner Violence: Not At Risk (04/13/2023)   Humiliation, Afraid, Rape, and Kick questionnaire    Fear of Current or Ex-Partner: No    Emotionally Abused: No    Physically Abused: No    Sexually Abused: No   Family History  Problem Relation Age of Onset   Coronary artery disease Mother    Coronary artery disease Maternal Grandmother    Hypertension Father    Diabetes Father    There were no vitals taken for this visit.  Wt Readings from Last 3 Encounters:  10/18/23 61.5 kg (135 lb 9.6 oz)  08/07/23 56.3 kg (124 lb 3.2 oz)  07/13/23 59.3 kg (130 lb 12.8 oz)   PHYSICAL EXAM: General: Well  appearing. No distress on RA, walked into clinic HEENT: neck supple.   Cardiac: JVP not visible. S1 and S2 present. No murmurs or rub. Resp: Lung sounds clear, diminished in bases Abdomen: Soft, non-tender, non-distended. + BS. Extremities: Warm and dry. No rash, cyanosis.  No edema.  Neuro: Alert and oriented x3. Affect pleasant. Moves all extremities without difficulty.  ASSESSMENT & PLAN: 1. CAD: H/o NSTEMI involving RCA in 2019 and 2020.  Ran out of Plavix, presented with inferior STEMI in 11/23.  She had successful stenting of the ostium of the PLV and PDA branches and PTCA of the entire previously stented RCA. Admitted 6/24 with CP, LHC showed ISR distal RCA, and new stenosis in proximal RCA. Plan for medical management. Continues to have intermittent stable angina in the evenings, resolves with SL nitroglycerin. Reports requiring less frequently - Continue Imdur 30 mg daily. - Continue Ranexa 500 mg bid, QTc 493 msec on ECG today. - Continue ASA + ticagrelor.  - Continue Crestor 40 mg daily. Good LDL (3/24). Repeat at follow up appt.  2. Chronic systolic CHF: Ischemic cardiomyopathy.  Echo in 5/20 with EF 30-35%. LV-gram at time of cath 11/23 showed EF down to 20%.  Echo 09/19/22 showed EF 20-25%, mod eccentric LVH, severe hypokinesis of the LV entire lateral wall. Normal RV function and size. Mild MR, trivial TR. Echo 3/24 showed EF 25-30% with septal-lateral dyssynchrony, mild RV dysfunction. Ltd echo (6/24) EF 20-25%, RV mildly reduced. ECG today with LBBB QRS 146 ms, CRT-D planning underway. NYHA class IIb-III, she not volume overloaded on exam. GDMT has been limited by soft BP and dizziness.  - Start spiro 12.5 mg daily. BMET/BNP today. Repeat BMET in 7-10 days. - Continue bisoprolol 2.5 mg daily (more cardio selective in setting of active smoker) - Continue losartan 12.5 mg daily. BMET/BNP today. - Continue Jardiance 10 mg daily.  - She has PRN lasix at home, she does not currently  need this. BNP today - Continue KCL 20 daily -  Has LBBB and persistently low EF from ischemic cardiomyopathy.  Seen by EP for CRT-D postponed d/t dental infection, instructed on importance and that she needs to call EP for scheduling now that infection has resolved.  - She refuses to wear Lifevest any longer.  3. Hypothyroidism: Per PCP.  She is on levothyroxine. 4. Smoking: Discussed smoking cessation.  - Has cut back from 1ppd to 1/2 ppd. 5. PAD: History of long occlusion left SFA.  Has Left calf claudication with long distance walking.  No rest pain or pedal ulcerations.  6. Fatigue: likely multifactorial. Ferretin 36, Tsat 9 - Referred for Fe transfusion. She has been attempted to be contacted to schedule appointment. Instructed to give them a call to schedule appointment   Follow up in 2 months with APP.  Jacklynn Ganong, NP 12/19/2023

## 2023-12-22 ENCOUNTER — Other Ambulatory Visit (HOSPITAL_COMMUNITY): Payer: Self-pay | Admitting: Cardiology

## 2023-12-22 ENCOUNTER — Other Ambulatory Visit (HOSPITAL_COMMUNITY): Payer: Self-pay

## 2024-01-04 ENCOUNTER — Other Ambulatory Visit (HOSPITAL_COMMUNITY): Payer: Self-pay

## 2024-01-17 ENCOUNTER — Other Ambulatory Visit (HOSPITAL_COMMUNITY): Payer: Self-pay

## 2024-01-25 ENCOUNTER — Other Ambulatory Visit (HOSPITAL_COMMUNITY): Payer: Self-pay | Admitting: Cardiology

## 2024-01-25 DIAGNOSIS — I2510 Atherosclerotic heart disease of native coronary artery without angina pectoris: Secondary | ICD-10-CM

## 2024-01-25 DIAGNOSIS — I5022 Chronic systolic (congestive) heart failure: Secondary | ICD-10-CM

## 2024-02-06 ENCOUNTER — Other Ambulatory Visit (HOSPITAL_COMMUNITY): Payer: Self-pay | Admitting: Family Medicine

## 2024-02-14 ENCOUNTER — Other Ambulatory Visit (HOSPITAL_COMMUNITY): Payer: Self-pay | Admitting: Cardiology

## 2024-02-25 ENCOUNTER — Other Ambulatory Visit (HOSPITAL_COMMUNITY): Payer: Self-pay | Admitting: Cardiology

## 2024-02-25 DIAGNOSIS — I5022 Chronic systolic (congestive) heart failure: Secondary | ICD-10-CM

## 2024-02-25 DIAGNOSIS — I2510 Atherosclerotic heart disease of native coronary artery without angina pectoris: Secondary | ICD-10-CM

## 2024-03-07 ENCOUNTER — Other Ambulatory Visit (HOSPITAL_COMMUNITY): Payer: Self-pay | Admitting: Family Medicine

## 2024-03-21 ENCOUNTER — Other Ambulatory Visit (HOSPITAL_COMMUNITY): Payer: Self-pay | Admitting: Family Medicine

## 2024-03-26 ENCOUNTER — Telehealth (HOSPITAL_COMMUNITY): Payer: Self-pay | Admitting: Cardiology

## 2024-03-27 ENCOUNTER — Encounter (HOSPITAL_COMMUNITY): Admitting: Cardiology

## 2024-03-30 ENCOUNTER — Other Ambulatory Visit (HOSPITAL_COMMUNITY): Payer: Self-pay | Admitting: Cardiology

## 2024-04-01 ENCOUNTER — Telehealth (HOSPITAL_COMMUNITY): Payer: Self-pay

## 2024-04-01 NOTE — Telephone Encounter (Signed)
 Called to confirm/remind patient of their appointment at the [facility] on 04/02/24.   Appointment:   [x] Confirmed  [] Left mess   [] No answer/No voice mail  [] VM Full/unable to leave message  [] Phone not in service  Patient reminded to bring all medications and/or complete list.  Confirmed patient has transportation. Gave directions, instructed to utilize valet parking.

## 2024-04-01 NOTE — Progress Notes (Signed)
 [facility]  NOTE  PCP: Bucio, Melodi Sprung, FNP HF Cardiologist: Dr. Mitzie Anda Reason for Visit: Heart Failure Follow-up  HPI: [name] is a 63 y.o. female with hx of CAD with NSTEMI in 2019 with stent to RCA and then in 2020 NSTEMI with plaque rupture of RCA in-stent with 2 overlying stents to RCA, hypothyroidism, HLD, PAD, tobacco use, ETOH and ischemic cardiomyopathy EF 30-35% on 5/20 echo who is being seen 09/18/2022 for the evaluation of STEMI with ischemic cardiomyopathy. She has a history of PAD with long occlusion left SFA.    Admitted 11/23 with CP, ran out of Plavix  for several days. EKG showed LBBB and prominent inferior ST elevation. She had emergent cath for inferior STEMI with 90% ISR mid-distal RCA, 99% stenosis ostial PDA and 95% stenosis ostial PLV. Patient had PTCA to the long mid-distal RCA stented segment and DES to ostial PLV and PDA. LV-gram showed EF in 20% range. Echo 09/19/22 showed EF 20-25%, mod eccentric LVH, severe hypokinesis of the LV entire lateral wall. Normal RV function and size. Mild MR, trivial TR. GDMT titrated and discharged home with LifeVest, weight 110 lbs.  Echo 3/24 showed EF 25-30% with septal-lateral dyssynchrony, mild RV dysfunction. No longer wearing LifeVest. Awaiting Medicaid so she can get coverage for CRT-D.  Admitted 6/24 with NSTEMI. Transiently required NE for hypotension. Ltd Echo showed EF 20-25%, RV mildly reduced. LHC showed ISR in distal RCA, and new stenosis in proximal RCA, planned medical management. Ranexa  started. Spiro and losartan  held due to soft BP, she was discharged home, weight 125 lbs.  Follow up 8/24 with PharmD. Weight down 19 lbs in setting of taking PRN lasix  daily. Labs showed K 2.3 and SCr 1.15. K repleted and instructed to stop Lasix .  EP has reached out to patient multiple times to schedule CRT-D, however patient has not scheduled.   She returns today for heart failure follow up. Overall feeling fatigued. NYHA II.  Reports chest pain and fatigue. Chest pain comes and goes and is relieved by nitroglycerin . Has some good days and bad. Reports postural dizziness with bending over. Sleeping on 2 pillow. Denies syncope, abnormal bleeding, DOE. Able to perform ADLs. Appetite comes and goes. Does not take BP at home. Smoking 0.5-1 ppd. Compliant with all medications.  PMH: 1. Hypothyroidism 2. H/o heavy ETOH 3. Active smoker 4. CAD: NSTEMI 2019 with PCI to RCA.  - NSTEMI 2020 with stents to RCA.  - Inferior STEMI (11/23): 90% ISR mid-distal RCA, 99% stenosis ostial PDA and 95% stenosis ostial PLV. Patient had PTCA to the long mid-distal RCA stented segment and DES to ostial PLV and PDA.   - LHC (6/24):  ISR in distal RCA, and new stenosis in proximal RCA. Plan for medical management. 5. Chronic systolic CHF: Ischemic cardiomyopathy.  - Echo (5/20) with EF 30-35%.  - LV-gram 11/23 with EF 20% - Echo (3/24): EF 25-30%, septal-lateral dyssynchrony, mildly decreased RV systolic function. L - Ltd Echo (6/24): EF 20-25%, RV mildly reduced.  6. PAD: Long occlusion left SFA 7. LBBB  Current Outpatient Medications  Medication Sig Dispense Refill   acetaminophen  (TYLENOL ) 500 MG tablet Take 500 mg by mouth every 6 (six) hours as needed.     aspirin  81 MG chewable tablet Chew 1 tablet (81 mg total) by mouth daily. 30 tablet 6   bisoprolol  (ZEBETA ) 5 MG tablet TAKE 1/2 TABLET BY MOUTH ONCE DAILY (*prescriber requesting you make a FOLLOWING UP appointment 02/06/24*) 45  tablet 3   empagliflozin  (JARDIANCE ) 10 MG TABS tablet Take 1 tablet (10 mg total) by mouth daily before breakfast. 30 tablet 6   isosorbide  mononitrate (IMDUR ) 30 MG 24 hr tablet TAKE 1 TABLET BY MOUTH DAILY 30 tablet 2   levothyroxine  (SYNTHROID ) 100 MCG tablet Take 1 tablet (100 mcg total) by mouth daily at 6 (six) AM. 30 tablet 0   losartan  (COZAAR ) 25 MG tablet TAKE 1/2 TABLET BY MOUTH DAILY 15 tablet 2   Multiple Vitamin (MULTIVITAMIN WITH MINERALS)  TABS tablet Take 1 tablet by mouth daily. 30 tablet 0   nitroGLYCERIN  (NITROSTAT ) 0.4 MG SL tablet Place 1 tablet (0.4 mg total) under the tongue every 5 (five) minutes x 3 doses as needed for chest pain. 25 tablet 12   Omega-3 Fatty Acids (FISH OIL) 1200 MG CAPS Take 1,200 mg by mouth daily.     ranolazine  (RANEXA ) 500 MG 12 hr tablet TAKE 1 TABLET TWICE DAILY 60 tablet 1   rosuvastatin  (CRESTOR ) 40 MG tablet TAKE 1 TABLET BY MOUTH ONCE DAILY 30 tablet 1   spironolactone  (ALDACTONE ) 25 MG tablet Take 0.5 tablets (12.5 mg total) by mouth daily. 45 tablet 3   ticagrelor  (BRILINTA ) 90 MG TABS tablet Take 1 tablet (90 mg total) by mouth 2 (two) times daily. 180 tablet 3   furosemide  (LASIX ) 20 MG tablet Take 1 tablet (20 mg total) by mouth daily as needed for weight gain of 3 lbs in 24 hours or 5 lbs in a week 30 tablet 6   No current facility-administered medications for this encounter.   Allergies  Allergen Reactions   Penicillins Other (See Comments)    Fever, vomiting Has patient had a PCN reaction causing immediate rash, facial/tongue/throat swelling, SOB or lightheadedness with hypotension: NO Has patient had a PCN reaction causing severe rash involving mucus membranes or skin necrosis: NO Has patient had a PCN reaction that required hospitalization: NO Has patient had a PCN reaction occurring within the last 10 years: NO If all of the above answers are "NO", then may proceed Childhood allergy   Social History   Socioeconomic History   Marital status: Married    Spouse name: Not on file   Number of children: Not on file   Years of education: Not on file   Highest education level: Not on file  Occupational History   Not on file  Tobacco Use   Smoking status: Former    Current packs/day: 0.00    Average packs/day: 1 pack/day for 22.0 years (22.0 ttl pk-yrs)    Types: Cigarettes    Start date: 08/09/1996    Quit date: 08/09/2018    Years since quitting: 5.6   Smokeless tobacco:  Never  Substance and Sexual Activity   Alcohol use: Yes    Alcohol/week: 8.0 standard drinks of alcohol    Types: 8 Glasses of wine per week   Drug use: No   Sexual activity: Not on file  Other Topics Concern   Not on file  Social History Narrative   Married   Gets regular exercise   Social Drivers of Health   Financial Resource Strain: Not on file  Food Insecurity: No Food Insecurity (04/13/2023)   Hunger Vital Sign    Worried About Running Out of Food in the Last Year: Never true    Ran Out of Food in the Last Year: Never true  Transportation Needs: No Transportation Needs (04/13/2023)   PRAPARE - Transportation    Lack  of Transportation (Medical): No    Lack of Transportation (Non-Medical): No  Physical Activity: Not on file  Stress: Not on file  Social Connections: Unknown (03/18/2022)   Received from [facility], [facility]   Social Network    Social Network: Not on file  Intimate Partner Violence: Not At Risk (04/13/2023)   Humiliation, Afraid, Rape, and Kick questionnaire    Fear of Current or Ex-Partner: No    Emotionally Abused: No    Physically Abused: No    Sexually Abused: No   Family History  Problem Relation Age of Onset   Coronary artery disease Mother    Coronary artery disease Maternal Grandmother    Hypertension Father    Diabetes Father    BP 98/68   Pulse 76   Wt 62.4 kg (137 lb 9.6 oz)   SpO2 95%   BMI 25.17 kg/m   Wt Readings from Last 3 Encounters:  04/02/24 62.4 kg (137 lb 9.6 oz)  10/18/23 61.5 kg (135 lb 9.6 oz)  08/07/23 56.3 kg (124 lb 3.2 oz)   PHYSICAL EXAM: General: Well appearing. No distress on RA Cardiac: JVP flat. S1 and S2 present. No murmurs or rub. Resp: Expiratory wheezing Extremities: Warm and dry.  Trace peripheral edema.  Neuro: Alert and oriented x3. Affect pleasant. Moves all extremities without difficulty.  ECG (personally reviewed): SR 73 bpm with 1AVB and LBBB, QRS 152 ms  ReDs reading: 27 %,  normal  ASSESSMENT & PLAN: 1. CAD: H/o NSTEMI involving RCA in 2019 and 2020.  Ran out of Plavix , presented with inferior STEMI in 11/23.  She had successful stenting of the ostium of the PLV and PDA branches and PTCA of the entire previously stented RCA. Admitted 6/24 with CP, LHC showed ISR distal RCA, and new stenosis in proximal RCA. Plan for medical management.  - Intermittent stable angina. Resolves with SL nitroglycerin . - Continue Imdur  30 mg daily. - Continue Ranexa  500 mg bid, Qtc 493 ms on ECG today - Continue ASA + ticagrelor .  - Continue Crestor  40 mg daily. Lipid panel today.  2. Chronic systolic CHF: Ischemic cardiomyopathy.  Echo in 5/20 with EF 30-35%. LV-gram at time of cath 11/23 showed EF down to 20%.  Echo 09/19/22 showed EF 20-25%, mod eccentric LVH, severe hypokinesis of the LV entire lateral wall. Normal RV function and size. Mild MR, trivial TR. Echo 3/24 showed EF 25-30% with septal-lateral dyssynchrony, mild RV dysfunction. Ltd echo (6/24) EF 20-25%, RV mildly reduced.  - NYHA class II-IIIa, she not volume overloaded on exam. - GDMT has been limited by soft BP.  - Continue spiro 12.5 mg daily.  - Continue bisoprolol  2.5 mg daily at night (more cardio selective in setting of active smoker) - Continue losartan  12.5 mg daily. BMET/BNP today. - Continue Jardiance  10 mg daily.  - Has Lasix  PRN, has not needed. - Has persistent LBBB and low EF from ischemic cardiomyopathy. Needs CRT-D, initially postponed d/t dental infection. EP has now reached out multiple times to schedule, however she has failed to make appointment. Will re-refer, discussed need for CRT-D again, reports that she plans to make appointment. - Need to update echo  3. Hypothyroidism: Per PCP. On levothyroxine .  4. Smoking: Discussed smoking cessation. Remains smoking 1/2-1ppd.  5. PAD: History of long occlusion left SFA.  Has Left calf claudication with long distance walking.  No rest pain or pedal  ulcerations.  6. Fatigue: likely multifactorial. Ferritin 36, Tsat 9 - Referred for  Fe transfusion. She has been attempted to be contacted to schedule appointment.  Follow up in 2 month with Dr. Mitzie Anda + echo  Whitney Michalle Rademaker, NP 04/02/2024

## 2024-04-02 ENCOUNTER — Ambulatory Visit (HOSPITAL_COMMUNITY)
Admission: RE | Admit: 2024-04-02 | Discharge: 2024-04-02 | Disposition: A | Discharge status: Home / Self Care | Source: Ambulatory Visit | Attending: Cardiology | Admitting: Cardiology

## 2024-04-02 ENCOUNTER — Encounter (HOSPITAL_COMMUNITY): Payer: Self-pay

## 2024-04-02 ENCOUNTER — Ambulatory Visit (HOSPITAL_COMMUNITY): Payer: Self-pay | Admitting: Cardiology

## 2024-04-02 VITALS — BP 98/68 | HR 76 | Wt 137.6 lb

## 2024-04-02 DIAGNOSIS — F17200 Nicotine dependence, unspecified, uncomplicated: Secondary | ICD-10-CM | POA: Diagnosis not present

## 2024-04-02 DIAGNOSIS — F17210 Nicotine dependence, cigarettes, uncomplicated: Secondary | ICD-10-CM | POA: Insufficient documentation

## 2024-04-02 DIAGNOSIS — E039 Hypothyroidism, unspecified: Secondary | ICD-10-CM | POA: Insufficient documentation

## 2024-04-02 DIAGNOSIS — Z7984 Long term (current) use of oral hypoglycemic drugs: Secondary | ICD-10-CM | POA: Insufficient documentation

## 2024-04-02 DIAGNOSIS — I5022 Chronic systolic (congestive) heart failure: Secondary | ICD-10-CM | POA: Insufficient documentation

## 2024-04-02 DIAGNOSIS — R5383 Other fatigue: Secondary | ICD-10-CM | POA: Diagnosis not present

## 2024-04-02 DIAGNOSIS — Z79890 Hormone replacement therapy (postmenopausal): Secondary | ICD-10-CM | POA: Diagnosis not present

## 2024-04-02 DIAGNOSIS — Z7902 Long term (current) use of antithrombotics/antiplatelets: Secondary | ICD-10-CM | POA: Diagnosis not present

## 2024-04-02 DIAGNOSIS — I5042 Chronic combined systolic (congestive) and diastolic (congestive) heart failure: Secondary | ICD-10-CM

## 2024-04-02 DIAGNOSIS — Z79899 Other long term (current) drug therapy: Secondary | ICD-10-CM | POA: Diagnosis not present

## 2024-04-02 DIAGNOSIS — I739 Peripheral vascular disease, unspecified: Secondary | ICD-10-CM

## 2024-04-02 DIAGNOSIS — Z7982 Long term (current) use of aspirin: Secondary | ICD-10-CM | POA: Insufficient documentation

## 2024-04-02 DIAGNOSIS — I447 Left bundle-branch block, unspecified: Secondary | ICD-10-CM | POA: Insufficient documentation

## 2024-04-02 DIAGNOSIS — I252 Old myocardial infarction: Secondary | ICD-10-CM | POA: Insufficient documentation

## 2024-04-02 DIAGNOSIS — R5382 Chronic fatigue, unspecified: Secondary | ICD-10-CM

## 2024-04-02 DIAGNOSIS — I255 Ischemic cardiomyopathy: Secondary | ICD-10-CM | POA: Diagnosis not present

## 2024-04-02 DIAGNOSIS — I70212 Atherosclerosis of native arteries of extremities with intermittent claudication, left leg: Secondary | ICD-10-CM | POA: Diagnosis not present

## 2024-04-02 DIAGNOSIS — D649 Anemia, unspecified: Secondary | ICD-10-CM | POA: Diagnosis not present

## 2024-04-02 DIAGNOSIS — I25118 Atherosclerotic heart disease of native coronary artery with other forms of angina pectoris: Secondary | ICD-10-CM | POA: Diagnosis not present

## 2024-04-02 DIAGNOSIS — Z955 Presence of coronary angioplasty implant and graft: Secondary | ICD-10-CM | POA: Insufficient documentation

## 2024-04-02 LAB — BASIC METABOLIC PANEL WITH GFR
Anion gap: 7 (ref 5–15)
BUN: 8 mg/dL (ref 8–23)
CO2: 22 mmol/L (ref 22–32)
Calcium: 9.1 mg/dL (ref 8.9–10.3)
Chloride: 107 mmol/L (ref 98–111)
Creatinine, Ser: 0.99 mg/dL (ref 0.44–1.00)
GFR, Estimated: >60 mL/min (ref >60)
Glucose, Bld: 92 mg/dL (ref 70–99)
Potassium: 3.7 mmol/L (ref 3.5–5.1)
Sodium: 136 mmol/L (ref 135–145)

## 2024-04-02 LAB — LIPID PANEL
Cholesterol: 86 mg/dL (ref 0–200)
HDL: 31 mg/dL — ABNORMAL LOW (ref >40)
LDL Cholesterol: 21 mg/dL (ref 0–99)
Total CHOL/HDL Ratio: 2.8 ratio
Triglycerides: 172 mg/dL — ABNORMAL HIGH (ref <150)
VLDL: 34 mg/dL (ref 0–40)

## 2024-04-02 LAB — BRAIN NATRIURETIC PEPTIDE: B Natriuretic Peptide: 93.3 pg/mL (ref 0.0–100.0)

## 2024-04-02 MED ORDER — FUROSEMIDE 20 MG PO TABS: 20.0000 mg | ORAL_TABLET | ORAL | 6 refills | Status: AC | PRN

## 2024-04-02 NOTE — Progress Notes (Signed)
 ReDS Vest / Clip - 04/02/24 1300       ReDS Vest / Clip   Station Marker A    Ruler Value 29.5    ReDS Value Range Low volume    ReDS Actual Value 27

## 2024-04-02 NOTE — Patient Instructions (Addendum)
 Good to see you today!  TAKE bisoprolol  medication nightly  Labs done today, your results will be available in MyChart, we will contact you for abnormal readings. You have been referred to Electrophysiology they will call to  schedule an appointment   Your physician recommends that you schedule a follow-up appointment:3 months with echocardiogram(August) Call in June to schedule an appointment  If you have any questions or concerns before your next appointment please send us  a message through [place] or call our office at [phone].    TO LEAVE A MESSAGE FOR THE NURSE SELECT OPTION 2, PLEASE LEAVE A MESSAGE INCLUDING: YOUR NAME DATE OF BIRTH CALL BACK NUMBER REASON FOR CALL**this is important as we prioritize the call backs  YOU WILL RECEIVE A CALL BACK THE SAME DAY AS LONG AS YOU CALL BEFORE 4:00 PM At the [facility], you and your health needs are our priority. As part of our continuing mission to provide you with exceptional heart care, we have created designated Provider Care Teams. These Care Teams include your primary Cardiologist (physician) and Advanced Practice Providers (APPs- Physician Assistants and Nurse Practitioners) who all work together to provide you with the care you need, when you need it.   You may see any of the following providers on your designated Care Team at your next follow up: Dr Jules Oar Dr Peder Bourdon Dr. Alwin Baars Dr. Arta Lark Amy Marijane Shoulders, NP Ruddy Corral, [place] [facility] Bliss, [place] Dennise Fitz, NP [place] Lee, NP Shawnee Dellen, NP Luster Salters, PharmD Bevely Brush, PharmD   Please be sure to bring in all your medications bottles to every appointment.    Thank you for choosing [facility]

## 2024-04-11 ENCOUNTER — Encounter: Payer: Self-pay | Admitting: Cardiovascular Disease

## 2024-04-11 ENCOUNTER — Ambulatory Visit: Attending: Cardiovascular Disease | Admitting: Cardiovascular Disease

## 2024-04-11 VITALS — BP 108/72 | HR 81 | Ht 62.0 in | Wt 137.8 lb

## 2024-04-11 DIAGNOSIS — I25119 Atherosclerotic heart disease of native coronary artery with unspecified angina pectoris: Secondary | ICD-10-CM | POA: Diagnosis present

## 2024-04-11 DIAGNOSIS — Z01812 Encounter for preprocedural laboratory examination: Secondary | ICD-10-CM

## 2024-04-11 DIAGNOSIS — I255 Ischemic cardiomyopathy: Secondary | ICD-10-CM | POA: Diagnosis not present

## 2024-04-11 DIAGNOSIS — I447 Left bundle-branch block, unspecified: Secondary | ICD-10-CM

## 2024-04-11 NOTE — Patient Instructions (Signed)
 Medication Instructions:  Your physician recommends that you continue on your current medications as directed. Please refer to the Current Medication list given to you today.  *If you need a refill on your cardiac medications before your next appointment, please call your pharmacy*  Lab Work: None ordered today  If you have labs (blood work) drawn today and your tests are completely normal, you will receive your results only by: MyChart Message (if you have MyChart) OR A paper copy in the mail If you have any lab test that is abnormal or we need to change your treatment, we will call you to review the results.  Testing/Procedures: None ordered.   Follow-Up: At [facility], you and your health needs are our priority.  As part of our continuing mission to provide you with exceptional heart care, our providers are all part of one team.  This team includes your primary Cardiologist (physician) and Advanced Practice Providers or APPs (Physician Assistants and Nurse Practitioners) who all work together to provide you with the care you need, when you need it.  Your next appointment:   To be scheduled   We recommend signing up for the patient portal called "MyChart".  Sign up information is provided on this After Visit Summary.  MyChart is used to connect with patients for Virtual Visits (Telemedicine).  Patients are able to view lab/test results, encounter notes, upcoming appointments, etc.  Non-urgent messages can be sent to your provider as well.   To learn more about what you can do with MyChart, go to [email or web address].

## 2024-04-11 NOTE — Progress Notes (Signed)
 Electrophysiology Office Note:    Date:  04/11/2024   ID:  Whitney May, DOB 1961-09-06, MRN [id number]  PCP:  Gwenyth Leo, FNP   [facility] Providers Cardiologist:  Peder Bourdon, MD Electrophysiologist:  Efraim Grange, MD     Referring MD: Lee, [place], NP   History of Present Illness:    Whitney May is a 63 y.o. female with a medical history significant for coronary disease with MI in 2019, RCA stent, referred for CRT-D placement.     She was diagnosed with coronary disease and heart failure in 2019 at the time of an admission for NSTEMI.  She has been under care of cardiology since that time and has had at least 1 additional NSTEMI.  She recently had a coronary angiogram in June 2020 that showed occlusion of her prior RCA stent with collateral filling of the PDA.    She has significant fatigue due to her heart failure.  She has difficulty functioning at work, which involves some walking and lifting boxes  At her last visit I recommended BiV ICD, and we discussed the procedure at length.  She was undecided as to whether she wanted to schedule it and despite multiple calls, has not made arrangements to undergo the procedure.     Today, she reports she is at baseline.  She has not had any syncope.  EKGs/Labs/Other Studies Reviewed Today:     Echocardiogram:  TTE 04/13/2023 EF 20 to 25%.  TTE January 08, 2023 EF 25 to 30%  Monitors:   Stress testing:   Advanced imaging:   Cardiac catherization  April 16, 2023 - reviewed in epic Of note, 100% stenosis noted of overlapping stents in the distal RCA.  The right PDA system filled by left-to-right collaterals  EKG:         Physical Exam:    VS:  BP 108/72 (BP Location: Left Arm)   Pulse 81   Ht 5\' 2"  (1.575 m)   Wt 137 lb 12.8 oz (62.5 kg)   SpO2 96%   BMI 25.20 kg/m     Wt Readings from Last 3 Encounters:  04/11/24 137 lb 12.8 oz (62.5 kg)  04/02/24 137 lb 9.6 oz (62.4 kg)  10/18/23 135  lb 9.6 oz (61.5 kg)     GEN: Well nourished, well developed in no acute distress CARDIAC: RRR, no murmurs, rubs, gallops RESPIRATORY:  Normal work of breathing MUSCULOSKELETAL: no edema    ASSESSMENT & PLAN:     CHFrEF EF is persistently less than 30% despite GDMT for over 90 days She meets criteria for primary prevention ICD with CRT due to left bundle branch block Continue Jardiance  10 mg, Lasix  20 mg, Imdur  30 mg, Cozaar  25 mg, bisoprolol  2.5 mg The procedure was canceled previously due to dental infection She is eager now to reschedule  I discussed the indication for the procedure and the logistics, risks, potential benefit, and after care. I specifically explained that risks include but are not limited to infection, bleeding,damage to blood vessels, lung, and the heart -- but risk of prolonged hospitalization, need for surgery, or the event of stroke, heart attack, or death are low but not zero.    CAD  S/p multiple MIs, PCI Continue Ranexa  500 mg, Crestor  40 mg, aspirin  81 mg She will need to hold Brilinta  briefly before the procedure  LBBB Will plan to place CRT device     Signed, Efraim Grange, MD  04/11/2024 2:55  PM    [facility]

## 2024-04-14 ENCOUNTER — Other Ambulatory Visit (HOSPITAL_COMMUNITY): Payer: Self-pay | Admitting: Cardiology

## 2024-04-14 DIAGNOSIS — I2510 Atherosclerotic heart disease of native coronary artery without angina pectoris: Secondary | ICD-10-CM

## 2024-04-14 DIAGNOSIS — I5022 Chronic systolic (congestive) heart failure: Secondary | ICD-10-CM

## 2024-04-16 ENCOUNTER — Institutional Professional Consult (permissible substitution): Admitting: Cardiovascular Disease

## 2024-05-07 ENCOUNTER — Telehealth (HOSPITAL_COMMUNITY): Payer: Self-pay

## 2024-05-07 NOTE — Telephone Encounter (Signed)
 Spoke with patient to discuss upcoming procedure.   Confirmed patient is scheduled for a Biventricular implantable cardioverter defibrillator on Friday, July 11 with Dr. Eulas Furbish. Instructed patient to arrive at the Main Entrance A at [facility]: [address] [place], [place] [address] and check in at Admitting at 12:30 PM.   Labs to be completed July 3.  Any recent signs of acute illness or been started on antibiotics? No Any new medications started? No Any medications to hold? Hold Jardiance  for 3 days prior to procedure- last dose on July 7. Hold Brilinta  prior to procedure- last dose on July 9. Hold Furosemide  and Spironolactone  the morning of your procedure.  Medication instructions:  On the morning of your procedure you may take all other medications not discussed with a sip of water. No eating or drinking after midnight prior to procedure.   The night before your procedure and the morning of your procedure, wash thoroughly with the CHG surgical soap from the neck down, paying special attention to the area where your procedure will be performed.  Advised of plan to go home the same day and will only stay overnight if medically necessary. You MUST have a responsible adult to drive you home and MUST be with you the first 24 hours after you arrive home.  Patient verbalized understanding to all instructions provided and agreed to proceed with procedure.

## 2024-05-14 MED ORDER — POTASSIUM CHLORIDE CRYS ER 20 MEQ PO TBCR: EXTENDED_RELEASE_TABLET | ORAL | 0 refills | Status: DC

## 2024-05-14 NOTE — Telephone Encounter (Signed)
 Informed Dr. Nancey of lab results showing potassium 3.4. She takes furosemide  daily PRN and spironolactone  daily. Per Dr. Nancey, she needs to stay on those; let's just get her on Potassium 20 meq daily tomorrow and Friday AM.   Spoke with patient and informed of provider recommendations. She verbalized understanding. Rx sent to [facility] pharmacy.

## 2024-05-14 NOTE — Addendum Note (Signed)
 Addended by: Chenell Lozon O on: 05/14/2024 06:04 PM   Modules accepted: Orders

## 2024-05-15 ENCOUNTER — Encounter: Payer: Self-pay | Admitting: Emergency Medicine

## 2024-05-15 NOTE — Pre-Procedure Instructions (Signed)
Instructed patient on the following items: Arrival time 1200 Nothing to eat or drink after midnight No meds AM of procedure Responsible person to drive you home and stay with you for 24 hrs Wash with special soap night before and morning of procedure  

## 2024-05-16 ENCOUNTER — Ambulatory Visit (HOSPITAL_COMMUNITY)
Admission: RE | Disposition: A | Discharge status: Home / Self Care | Payer: Self-pay | Source: Home / Self Care | Attending: Cardiovascular Disease

## 2024-05-16 ENCOUNTER — Other Ambulatory Visit: Payer: Self-pay

## 2024-05-16 ENCOUNTER — Ambulatory Visit (HOSPITAL_COMMUNITY)

## 2024-05-16 ENCOUNTER — Ambulatory Visit (HOSPITAL_COMMUNITY)
Admission: RE | Admit: 2024-05-16 | Discharge: 2024-05-16 | Disposition: A | Discharge status: Home / Self Care | Attending: Cardiovascular Disease | Admitting: Cardiovascular Disease

## 2024-05-16 DIAGNOSIS — Z7982 Long term (current) use of aspirin: Secondary | ICD-10-CM | POA: Insufficient documentation

## 2024-05-16 DIAGNOSIS — Z7901 Long term (current) use of anticoagulants: Secondary | ICD-10-CM | POA: Insufficient documentation

## 2024-05-16 DIAGNOSIS — I447 Left bundle-branch block, unspecified: Secondary | ICD-10-CM

## 2024-05-16 DIAGNOSIS — I252 Old myocardial infarction: Secondary | ICD-10-CM | POA: Insufficient documentation

## 2024-05-16 DIAGNOSIS — I5022 Chronic systolic (congestive) heart failure: Secondary | ICD-10-CM | POA: Diagnosis present

## 2024-05-16 DIAGNOSIS — Z955 Presence of coronary angioplasty implant and graft: Secondary | ICD-10-CM | POA: Diagnosis not present

## 2024-05-16 DIAGNOSIS — Z79899 Other long term (current) drug therapy: Secondary | ICD-10-CM | POA: Insufficient documentation

## 2024-05-16 DIAGNOSIS — I2510 Atherosclerotic heart disease of native coronary artery without angina pectoris: Secondary | ICD-10-CM | POA: Diagnosis not present

## 2024-05-16 HISTORY — PX: BIV ICD INSERTION CRT-D: EP1195

## 2024-05-16 LAB — BASIC METABOLIC PANEL WITH GFR
Anion gap: 10 (ref 5–15)
BUN: 15 mg/dL (ref 8–23)
CO2: 17 mmol/L — ABNORMAL LOW (ref 22–32)
Calcium: 9.3 mg/dL (ref 8.9–10.3)
Chloride: 112 mmol/L — ABNORMAL HIGH (ref 98–111)
Creatinine, Ser: 0.84 mg/dL (ref 0.44–1.00)
GFR, Estimated: >60 mL/min (ref >60)
Glucose, Bld: 90 mg/dL (ref 70–99)
Potassium: 3.8 mmol/L (ref 3.5–5.1)
Sodium: 139 mmol/L (ref 135–145)

## 2024-05-16 SURGERY — BIV ICD INSERTION CRT-D

## 2024-05-16 MED ORDER — SODIUM CHLORIDE 0.9 % IV SOLN
INTRAVENOUS | Status: AC
  Filled 2024-05-16: qty 2

## 2024-05-16 MED ORDER — MIDAZOLAM HCL 2 MG/2ML IJ SOLN
INTRAMUSCULAR | Status: AC
  Filled 2024-05-16: qty 2

## 2024-05-16 MED ORDER — MIDAZOLAM HCL 5 MG/5ML IJ SOLN
INTRAMUSCULAR | Status: DC | PRN
  Administered 2024-05-16: 1 mg via INTRAVENOUS

## 2024-05-16 MED ORDER — ACETAMINOPHEN 325 MG PO TABS
325.0000 mg | ORAL_TABLET | ORAL | Status: DC | PRN
  Administered 2024-05-16: 650 mg via ORAL
  Filled 2024-05-16: qty 2

## 2024-05-16 MED ORDER — GENTAMICIN SULFATE 40 MG/ML IJ SOLN
80.0000 mg | INTRAMUSCULAR | Status: AC
  Administered 2024-05-16: 80 mg

## 2024-05-16 MED ORDER — LIDOCAINE HCL 1 % IJ SOLN
INTRAMUSCULAR | Status: AC
  Filled 2024-05-16: qty 60

## 2024-05-16 MED ORDER — SODIUM CHLORIDE 0.9 % IV SOLN: INTRAVENOUS | Status: DC

## 2024-05-16 MED ORDER — HEPARIN (PORCINE) IN NACL 1000-0.9 UT/500ML-% IV SOLN
INTRAVENOUS | Status: DC | PRN
  Administered 2024-05-16: 500 mL

## 2024-05-16 MED ORDER — POVIDONE-IODINE 10 % EX SWAB
2.0000 | Freq: Once | CUTANEOUS | Status: AC
  Administered 2024-05-16: 2 via TOPICAL

## 2024-05-16 MED ORDER — LIDOCAINE HCL (PF) 1 % IJ SOLN
INTRAMUSCULAR | Status: DC | PRN
  Administered 2024-05-16: 40 mL

## 2024-05-16 MED ORDER — CEFAZOLIN SODIUM-DEXTROSE 2-4 GM/100ML-% IV SOLN
INTRAVENOUS | Status: AC
  Filled 2024-05-16: qty 100

## 2024-05-16 MED ORDER — CHLORHEXIDINE GLUCONATE 4 % EX SOLN
4.0000 | Freq: Once | CUTANEOUS | Status: DC
  Filled 2024-05-16: qty 60

## 2024-05-16 MED ORDER — ONDANSETRON HCL 4 MG/2ML IJ SOLN: 4.0000 mg | Freq: Four times a day (QID) | INTRAMUSCULAR | Status: DC | PRN

## 2024-05-16 MED ORDER — CEFAZOLIN SODIUM-DEXTROSE 2-4 GM/100ML-% IV SOLN
2.0000 g | INTRAVENOUS | Status: AC
  Administered 2024-05-16: 2 g via INTRAVENOUS

## 2024-05-16 MED ORDER — FENTANYL CITRATE (PF) 100 MCG/2ML IJ SOLN
INTRAMUSCULAR | Status: AC
Start: 2024-05-16 — End: 2024-05-16
  Filled 2024-05-16: qty 2

## 2024-05-16 MED ORDER — FENTANYL CITRATE (PF) 100 MCG/2ML IJ SOLN
INTRAMUSCULAR | Status: DC | PRN
  Administered 2024-05-16: 25 ug via INTRAVENOUS

## 2024-05-16 SURGICAL SUPPLY — 18 items
CABLE SURGICAL S-101-97-12 (CABLE) ×1 IMPLANT
CATH CPS DIRECT 135 DS2C020 (CATHETERS) IMPLANT
CATH CPS DIRECT WD DS2C028 (CATHETERS) IMPLANT
CATH CPS LOCATOR 3D MED (CATHETERS) IMPLANT
ICD GALLANT HF CRT-D DF-4 IS-1 (ICD Generator) IMPLANT
KIT ESSENTIALS PG (KITS) IMPLANT
LEAD DURATA 7122Q-58CM (Lead) IMPLANT
LEAD ULTIPACE 52 LPA1231/52 (Lead) IMPLANT
LEAD ULTIPACE 65 LPA1231/65 (Lead) IMPLANT
PAD DEFIB RADIO PHYSIO CONN (PAD) ×1 IMPLANT
SHEATH 7FR PRELUDE SNAP 13 (SHEATH) IMPLANT
SHEATH 9.5FR PRELUDE SNAP 13 (SHEATH) IMPLANT
SHEATH 9FR PRELUDE SNAP 13 (SHEATH) IMPLANT
SLITTER AGILIS HISPRO (INSTRUMENTS) IMPLANT
TOOL HELIX LOCKING (MISCELLANEOUS) IMPLANT
TRAY PACEMAKER INSERTION (PACKS) ×1 IMPLANT
WIRE HI TORQ VERSACORE-J 145CM (WIRE) IMPLANT
WIRE MICRO SET SILHO 5FR 7 (SHEATH) IMPLANT

## 2024-05-16 NOTE — Progress Notes (Signed)
 X ray results relayed to attending. Okay to discharge. Patient to start aspirin  tomorrow and brilinta  in 3 days.

## 2024-05-16 NOTE — Discharge Instructions (Addendum)
 After Your Cardiac Device   ACTIVITY Do not lift your arm above shoulder height for 1 week after your procedure. After 7 days, you may progress as below.  You should remove your sling 24 hours after your procedure, unless otherwise instructed by your provider.     Friday May 23, 2024  Saturday May 24, 2024 Sunday May 25, 2024 Monday May 26, 2024   Do not lift, push, pull, or carry anything over 10 pounds with the affected arm until 6 weeks (Friday June 27, 2024 ) after your procedure.   You may drive AFTER your wound check, unless you have been told otherwise by your provider.   Ask your healthcare provider when you can go back to work   INCISION/Dressing If you are on a blood thinner such as Coumadin, Xarelto, Eliquis, Plavix , or Pradaxa please confirm with your provider when this should be resumed.   If large square, outer bandage is left in place, this can be removed after 24 hours from your procedure. Do not remove steri-strips.   Monitor your cardiac device site for redness, swelling, and drainage.   If your incision is sealed with Steri-strips or staples, you may shower 7 days after your procedure or when told by your provider. Do not remove the steri-strips or let the shower hit directly on your site. You may wash around your site with soap and water.    If you were discharged in a sling, please do not wear this during the day more than 48 hours after your surgery unless otherwise instructed. This may increase the risk of stiffness and soreness in your shoulder.   Avoid lotions, ointments, or perfumes over your incision until it is well-healed.  You may use a hot tub or a pool AFTER your wound check appointment if the incision is completely closed.   DEVICE MANAGEMENT You should receive your ID card for your new device in 4-8 weeks. Keep this card with you at all times once received. Consider wearing a medical alert bracelet or necklace.  Please follow manufacture  instructions for keeping your device charged carefully. This should be performed every 2 weeks at the very least.   Your cardiac device may be MRI compatible. This will be discussed at your next office visit/wound check.  You should avoid contact with strong electric or magnetic fields.   Do not use amateur (ham) radio equipment or electric (arc) welding torches. MP3 player headphones with magnets should not be used. Some devices are safe to use if held at least 12 inches (30 cm) from your cardiac device. These include power tools, lawn mowers, and speakers. If you are unsure if something is safe to use, ask your health care provider.  When using your cell phone, hold it to the ear that is on the opposite side from the cardiac device. Do not leave your cell phone in a pocket over the cardiac device.  You may safely use electric blankets, heating pads, computers, and microwave ovens.  Call the office right away if: You have chest pain. You feel more short of breath than you have felt before. You feel more light-headed than you have felt before. Your incision starts to open up.  This information is not intended to replace advice given to you by your health care provider. Make sure you discuss any questions you have with your health care provider.

## 2024-05-16 NOTE — H&P (Signed)
 Electrophysiology Office Note:    Date:  05/16/2024   ID:  Whitney May, DOB Jan 27, 1961, MRN [id number]  PCP:  Alston Silvio BROCKS, FNP   [facility] Providers Cardiologist:  Ezra Shuck, MD Electrophysiologist:  Eulas FORBES Furbish, MD     Referring MD: No ref. provider found   History of Present Illness:    Whitney May is a 63 y.o. female with a medical history significant for coronary disease with MI in 2019, RCA stent, referred for CRT-D placement.     She was diagnosed with coronary disease and heart failure in 2019 at the time of an admission for NSTEMI.  She has been under care of cardiology since that time and has had at least 1 additional NSTEMI.  She recently had a coronary angiogram in June 2020 that showed occlusion of her prior RCA stent with collateral filling of the PDA.    She has significant fatigue due to her heart failure.  She has difficulty functioning at work, which involves some walking and lifting boxes  At her last visit I recommended BiV ICD, and we discussed the procedure at length.  She was undecided as to whether she wanted to schedule it and despite multiple calls, has not made arrangements to undergo the procedure.     Today, she reports she is at baseline.  She has not had any syncope.  EKGs/Labs/Other Studies Reviewed Today:     Echocardiogram:  TTE 04/13/2023 EF 20 to 25%.  TTE January 08, 2023 EF 25 to 30%  Monitors:   Stress testing:   Advanced imaging:   Cardiac catherization  April 16, 2023 - reviewed in epic Of note, 100% stenosis noted of overlapping stents in the distal RCA.  The right PDA system filled by left-to-right collaterals  EKG:         Physical Exam:    VS:  BP 120/65   Pulse (!) 59   Temp 98.1 F (36.7 C)   Resp 16   Ht 5' 2 (1.575 m)   Wt 60.8 kg   SpO2 97%   BMI 24.51 kg/m     Wt Readings from Last 3 Encounters:  05/16/24 60.8 kg  04/11/24 62.5 kg  04/02/24 62.4 kg     GEN: Well  nourished, well developed in no acute distress CARDIAC: RRR, no murmurs, rubs, gallops RESPIRATORY:  Normal work of breathing MUSCULOSKELETAL: no edema    ASSESSMENT & PLAN:     CHFrEF EF is persistently less than 30% despite GDMT for over 90 days She meets criteria for primary prevention ICD with CRT due to left bundle branch block Continue Jardiance  10 mg, Lasix  20 mg, Imdur  30 mg, Cozaar  25 mg, bisoprolol  2.5 mg The procedure was canceled previously due to dental infection She is eager now to reschedule  I discussed the indication for the procedure and the logistics, risks, potential benefit, and after care. I specifically explained that risks include but are not limited to infection, bleeding,damage to blood vessels, lung, and the heart -- but risk of prolonged hospitalization, need for surgery, or the event of stroke, heart attack, or death are low but not zero.    CAD  S/p multiple MIs, PCI Continue Ranexa  500 mg, Crestor  40 mg, aspirin  81 mg She will need to hold Brilinta  briefly before the procedure  LBBB Will plan to place CRT device     Signed, Eulas FORBES Furbish, MD  05/16/2024 1:02 PM    [facility]

## 2024-05-17 ENCOUNTER — Encounter (HOSPITAL_COMMUNITY): Payer: Self-pay | Admitting: Cardiovascular Disease

## 2024-05-20 MED FILL — Midazolam HCl Inj 2 MG/2ML (Base Equivalent): INTRAMUSCULAR | Qty: 1 | Status: AC

## 2024-05-23 ENCOUNTER — Telehealth: Payer: Self-pay

## 2024-05-23 NOTE — Telephone Encounter (Signed)
 Follow-up after same day discharge: Implant date: 05/16/2024 MD: Eulas Furbish Device: Abbott ICD Location: Left Chest    Wound check visit: 05/29/2024 90 day MD follow-up: 09/12/2024  Remote Transmission received:05/18/2024  Dressing/sling removed: no longer wearing sling or dressing   Confirm OAC restart on: Pt is not on an OAC  Please continue to monitor your cardiac device site for redness, swelling, and drainage. Call the device clinic at [phone] if you experience these symptoms, fever/chills, or have questions about your device.   Remote monitoring is used to monitor your cardiac device from home. This monitoring is scheduled every 91 days by our office. It allows us  to keep an eye on the functioning of your device to ensure it is working properly.   Pt is overall doing well.

## 2024-05-29 ENCOUNTER — Ambulatory Visit

## 2024-05-30 ENCOUNTER — Ambulatory Visit
Admission: RE | Admit: 2024-05-30 | Discharge: 2024-05-30 | Disposition: A | Discharge status: Home / Self Care | Source: Ambulatory Visit | Attending: Cardiovascular Disease | Admitting: Cardiovascular Disease

## 2024-05-30 ENCOUNTER — Ambulatory Visit

## 2024-05-30 DIAGNOSIS — I255 Ischemic cardiomyopathy: Secondary | ICD-10-CM | POA: Insufficient documentation

## 2024-05-30 LAB — CUP PACEART INCLINIC DEVICE CHECK
Battery Remaining Longevity: 104 mo
Brady Statistic RA Percent Paced: 13 %
Brady Statistic RV Percent Paced: 99.52 %
Date Time Interrogation Session: 20250725163617
HighPow Impedance: 95.625
Implantable Lead Connection Status: 753985
Implantable Lead Connection Status: 753985
Implantable Lead Connection Status: 753985
Implantable Lead Implant Date: 20250711
Implantable Lead Implant Date: 20250711
Implantable Lead Implant Date: 20250711
Implantable Lead Location: 753858
Implantable Lead Location: 753859
Implantable Lead Location: 753860
Implantable Pulse Generator Implant Date: 20250711
Lead Channel Impedance Value: 450 Ohm
Lead Channel Impedance Value: 500 Ohm
Lead Channel Impedance Value: 562.5 Ohm
Lead Channel Pacing Threshold Amplitude: 0.75 V
Lead Channel Pacing Threshold Amplitude: 0.75 V
Lead Channel Pacing Threshold Amplitude: 0.75 V
Lead Channel Pacing Threshold Amplitude: 0.75 V
Lead Channel Pacing Threshold Pulse Width: 0.3 ms
Lead Channel Pacing Threshold Pulse Width: 0.3 ms
Lead Channel Pacing Threshold Pulse Width: 0.5 ms
Lead Channel Pacing Threshold Pulse Width: 0.5 ms
Lead Channel Sensing Intrinsic Amplitude: 1.9 mV
Lead Channel Sensing Intrinsic Amplitude: 3 mV
Lead Channel Setting Pacing Amplitude: 2 V
Lead Channel Setting Pacing Amplitude: 3.5 V
Lead Channel Setting Pacing Pulse Width: 0.3 ms
Lead Channel Setting Sensing Sensitivity: 0.5 mV
Pulse Gen Serial Number: 211050445
Zone Setting Status: 755011

## 2024-05-30 NOTE — Patient Instructions (Signed)
  After Your ICD (Implantable Cardiac Defibrillator)    Monitor your defibrillator site for redness, swelling, and drainage. Call the device clinic at [phone] if you experience these symptoms or fever/chills.  Your incision was closed with Steri-strips or staples:  You may shower 7 days after your procedure and wash your incision with soap and water. Avoid lotions, ointments, or perfumes over your incision until it is well-healed.  You may use a hot tub or a pool after your wound check appointment if the incision is completely closed.  Do not lift, push or pull greater than 10 pounds with the affected arm until 6 weeks after your procedure. UNTIL AFTER AUGUST 22ND. There are no other restrictions in arm movement after your wound check appointment.  Your ICD is designed to protect you from life threatening heart rhythms. Because of this, you may receive a shock.   1 shock with no symptoms:  Call the office during business hours. 1 shock with symptoms (chest pain, chest pressure, dizziness, lightheadedness, shortness of breath, overall feeling unwell):  Call 911. If you experience 2 or more shocks in 24 hours:  Call 911. If you receive a shock, you should not drive.  [place] DMV - no driving for 6 months if you receive appropriate therapy from your ICD.   ICD Alerts:  Some alerts are vibratory and others beep. These are NOT emergencies. Please call our office to let us  know. If this occurs at night or on weekends, it can wait until the next business day. Send a remote transmission.  If your device is capable of reading fluid status (for heart failure), you will be offered monthly monitoring to review this with you.   Remote monitoring is used to monitor your ICD from home. This monitoring is scheduled every 91 days by our office. It allows us  to keep an eye on the functioning of your device to ensure it is working properly. You will routinely see your Electrophysiologist annually (more often  if necessary).

## 2024-05-30 NOTE — Progress Notes (Signed)
 Normal multi chamber ICD wound check. Wound well healed. Presenting rhythm: AP/VP 60  . Routine testing performed. Thresholds, sensing, and impedances consistent with implant measurements. No treated arrhythmias. Reviewed arm restrictions to continue for 6 weeks total post op. Reviewed shock plan.  Pt enrolled in remote follow-up.  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   Programming Changes: - Turned Cap Confirm off on LV lead.  Do NOT turn RV lead on. Do NOT turn cap confirm on for LV lead.   Reviewed with Dr. Peggye Cassis. Jude Rep. Agrees with changes. Verbal order given for Chest XR & Limited ECHO.

## 2024-06-02 ENCOUNTER — Ambulatory Visit: Payer: Self-pay

## 2024-06-06 ENCOUNTER — Other Ambulatory Visit (HOSPITAL_COMMUNITY)

## 2024-06-08 ENCOUNTER — Other Ambulatory Visit (HOSPITAL_COMMUNITY): Payer: Self-pay | Admitting: Cardiology

## 2024-06-23 ENCOUNTER — Other Ambulatory Visit (HOSPITAL_COMMUNITY): Payer: Self-pay | Admitting: Cardiology

## 2024-06-23 DIAGNOSIS — I2510 Atherosclerotic heart disease of native coronary artery without angina pectoris: Secondary | ICD-10-CM

## 2024-06-23 DIAGNOSIS — I5022 Chronic systolic (congestive) heart failure: Secondary | ICD-10-CM

## 2024-07-02 ENCOUNTER — Ambulatory Visit (INDEPENDENT_AMBULATORY_CARE_PROVIDER_SITE_OTHER)

## 2024-07-02 DIAGNOSIS — I5022 Chronic systolic (congestive) heart failure: Secondary | ICD-10-CM

## 2024-07-02 LAB — CUP PACEART REMOTE DEVICE CHECK
Battery Remaining Longevity: 95 mo
Battery Remaining Percentage: 95.5 %
Battery Voltage: 3.11 V
Brady Statistic AP VP Percent: 11 %
Brady Statistic AP VS Percent: 1 %
Brady Statistic AS VP Percent: 88 %
Brady Statistic AS VS Percent: 1 %
Brady Statistic RA Percent Paced: 11 %
Date Time Interrogation Session: 20250827020558
HighPow Impedance: 100 Ohm
Implantable Lead Connection Status: 753985
Implantable Lead Connection Status: 753985
Implantable Lead Connection Status: 753985
Implantable Lead Implant Date: 20250711
Implantable Lead Implant Date: 20250711
Implantable Lead Implant Date: 20250711
Implantable Lead Location: 753858
Implantable Lead Location: 753859
Implantable Lead Location: 753860
Implantable Pulse Generator Implant Date: 20250711
Lead Channel Impedance Value: 490 Ohm
Lead Channel Impedance Value: 560 Ohm
Lead Channel Impedance Value: 730 Ohm
Lead Channel Pacing Threshold Amplitude: 0.75 V
Lead Channel Pacing Threshold Amplitude: 0.75 V
Lead Channel Pacing Threshold Pulse Width: 0.3 ms
Lead Channel Pacing Threshold Pulse Width: 0.5 ms
Lead Channel Sensing Intrinsic Amplitude: 1 mV
Lead Channel Sensing Intrinsic Amplitude: 2.8 mV
Lead Channel Setting Pacing Amplitude: 2 V
Lead Channel Setting Pacing Amplitude: 3.5 V
Lead Channel Setting Pacing Pulse Width: 0.3 ms
Lead Channel Setting Sensing Sensitivity: 0.5 mV
Pulse Gen Serial Number: 211050445
Zone Setting Status: 755011

## 2024-07-03 ENCOUNTER — Encounter (HOSPITAL_COMMUNITY): Payer: Self-pay

## 2024-07-03 ENCOUNTER — Emergency Department (HOSPITAL_COMMUNITY)

## 2024-07-03 ENCOUNTER — Encounter (HOSPITAL_COMMUNITY)
Admission: EM | Disposition: A | Discharge status: Home / Self Care | Payer: Self-pay | Source: Home / Self Care | Attending: Emergency Medicine

## 2024-07-03 ENCOUNTER — Observation Stay (HOSPITAL_COMMUNITY)
Admission: EM | Admit: 2024-07-03 | Discharge: 2024-07-04 | Disposition: A | Discharge status: Home / Self Care | Attending: Student in an Organized Health Care Education/Training Program | Admitting: Student in an Organized Health Care Education/Training Program

## 2024-07-03 ENCOUNTER — Telehealth: Payer: Self-pay | Admitting: Cardiovascular Disease

## 2024-07-03 ENCOUNTER — Observation Stay (HOSPITAL_COMMUNITY)

## 2024-07-03 ENCOUNTER — Other Ambulatory Visit: Payer: Self-pay

## 2024-07-03 ENCOUNTER — Emergency Department (HOSPITAL_BASED_OUTPATIENT_CLINIC_OR_DEPARTMENT_OTHER)

## 2024-07-03 DIAGNOSIS — I2510 Atherosclerotic heart disease of native coronary artery without angina pectoris: Secondary | ICD-10-CM | POA: Diagnosis not present

## 2024-07-03 DIAGNOSIS — I447 Left bundle-branch block, unspecified: Secondary | ICD-10-CM | POA: Diagnosis not present

## 2024-07-03 DIAGNOSIS — Z8659 Personal history of other mental and behavioral disorders: Secondary | ICD-10-CM | POA: Diagnosis not present

## 2024-07-03 DIAGNOSIS — I5022 Chronic systolic (congestive) heart failure: Secondary | ICD-10-CM | POA: Diagnosis not present

## 2024-07-03 DIAGNOSIS — T82599A Other mechanical complication of unspecified cardiac and vascular devices and implants, initial encounter: Principal | ICD-10-CM

## 2024-07-03 DIAGNOSIS — T82190A Other mechanical complication of cardiac electrode, initial encounter: Secondary | ICD-10-CM | POA: Diagnosis not present

## 2024-07-03 DIAGNOSIS — R079 Chest pain, unspecified: Secondary | ICD-10-CM

## 2024-07-03 DIAGNOSIS — Z7982 Long term (current) use of aspirin: Secondary | ICD-10-CM | POA: Insufficient documentation

## 2024-07-03 DIAGNOSIS — I110 Hypertensive heart disease with heart failure: Secondary | ICD-10-CM | POA: Diagnosis not present

## 2024-07-03 DIAGNOSIS — T82110D Breakdown (mechanical) of cardiac electrode, subsequent encounter: Secondary | ICD-10-CM

## 2024-07-03 DIAGNOSIS — T82110A Breakdown (mechanical) of cardiac electrode, initial encounter: Secondary | ICD-10-CM

## 2024-07-03 DIAGNOSIS — R0602 Shortness of breath: Secondary | ICD-10-CM | POA: Diagnosis present

## 2024-07-03 DIAGNOSIS — T82120A Displacement of cardiac electrode, initial encounter: Secondary | ICD-10-CM | POA: Diagnosis not present

## 2024-07-03 DIAGNOSIS — E039 Hypothyroidism, unspecified: Secondary | ICD-10-CM | POA: Insufficient documentation

## 2024-07-03 HISTORY — PX: LEAD REVISION/REPAIR: EP1213

## 2024-07-03 LAB — CBC WITH DIFFERENTIAL/PLATELET
Abs Granulocyte: 10 K/uL — ABNORMAL HIGH (ref 1.5–6.5)
Abs Immature Granulocytes: 0.07 K/uL (ref 0.00–0.07)
Basophils Absolute: 0.2 K/uL — ABNORMAL HIGH (ref 0.0–0.1)
Basophils Relative: 1 %
Eosinophils Absolute: 0.3 K/uL (ref 0.0–0.5)
Eosinophils Relative: 2 %
HCT: 46.3 % — ABNORMAL HIGH (ref 36.0–46.0)
Hemoglobin: 15.4 g/dL — ABNORMAL HIGH (ref 12.0–15.0)
Immature Granulocytes: 1 %
Lymphocytes Relative: 16 %
Lymphs Abs: 2.3 K/uL (ref 0.7–4.0)
MCH: 28.6 pg (ref 26.0–34.0)
MCHC: 33.3 g/dL (ref 30.0–36.0)
MCV: 86.1 fL (ref 80.0–100.0)
Monocytes Absolute: 1.5 K/uL — ABNORMAL HIGH (ref 0.1–1.0)
Monocytes Relative: 11 %
Neutro Abs: 10 K/uL — ABNORMAL HIGH (ref 1.7–7.7)
Neutrophils Relative %: 69 %
Platelets: 231 K/uL (ref 150–400)
RBC: 5.38 MIL/uL — ABNORMAL HIGH (ref 3.87–5.11)
RDW: 14.5 % (ref 11.5–15.5)
WBC: 14.3 K/uL — ABNORMAL HIGH (ref 4.0–10.5)
nRBC: 0 % (ref 0.0–0.2)

## 2024-07-03 LAB — TROPONIN I (HIGH SENSITIVITY)
Troponin I (High Sensitivity): 5 ng/L (ref <18)
Troponin I (High Sensitivity): 15 ng/L (ref <18)

## 2024-07-03 LAB — COMPREHENSIVE METABOLIC PANEL WITH GFR
ALT: 21 U/L (ref 0–44)
AST: 21 U/L (ref 15–41)
Albumin: 3.5 g/dL (ref 3.5–5.0)
Alkaline Phosphatase: 88 U/L (ref 38–126)
Anion gap: 9 (ref 5–15)
BUN: 11 mg/dL (ref 8–23)
CO2: 20 mmol/L — ABNORMAL LOW (ref 22–32)
Calcium: 9.3 mg/dL (ref 8.9–10.3)
Chloride: 106 mmol/L (ref 98–111)
Creatinine, Ser: 1 mg/dL (ref 0.44–1.00)
GFR, Estimated: >60 mL/min (ref >60)
Glucose, Bld: 117 mg/dL — ABNORMAL HIGH (ref 70–99)
Potassium: 3.3 mmol/L — ABNORMAL LOW (ref 3.5–5.1)
Sodium: 135 mmol/L (ref 135–145)
Total Bilirubin: 0.9 mg/dL (ref 0.0–1.2)
Total Protein: 7.1 g/dL (ref 6.5–8.1)

## 2024-07-03 LAB — ECHOCARDIOGRAM LIMITED
Height: 62 in
Weight: 2194.02 [oz_av]

## 2024-07-03 LAB — HIV ANTIBODY (ROUTINE TESTING W REFLEX): HIV Screen 4th Generation wRfx: NONREACTIVE

## 2024-07-03 SURGERY — LEAD REVISION/REPAIR

## 2024-07-03 MED ORDER — HEPARIN (PORCINE) IN NACL 1000-0.9 UT/500ML-% IV SOLN
INTRAVENOUS | Status: DC | PRN
Start: 2024-07-03 — End: 2024-07-03
  Administered 2024-07-03: 500 mL

## 2024-07-03 MED ORDER — LIDOCAINE HCL 1 % IJ SOLN
INTRAMUSCULAR | Status: AC
  Filled 2024-07-03: qty 20

## 2024-07-03 MED ORDER — LIDOCAINE HCL (PF) 1 % IJ SOLN
INTRAMUSCULAR | Status: DC | PRN
  Administered 2024-07-03: 30 mL

## 2024-07-03 MED ORDER — CEFAZOLIN SODIUM-DEXTROSE 2-4 GM/100ML-% IV SOLN
INTRAVENOUS | Status: AC
  Administered 2024-07-03: 2 g via INTRAVENOUS
  Filled 2024-07-03: qty 100

## 2024-07-03 MED ORDER — LEVOTHYROXINE SODIUM 100 MCG PO TABS
100.0000 ug | ORAL_TABLET | Freq: Every day | ORAL | Status: DC
  Administered 2024-07-04: 100 ug via ORAL
  Filled 2024-07-03: qty 1

## 2024-07-03 MED ORDER — SODIUM CHLORIDE 0.9 % IV SOLN
INTRAVENOUS | Status: AC
  Administered 2024-07-03: 80 mg
  Filled 2024-07-03: qty 2

## 2024-07-03 MED ORDER — SODIUM CHLORIDE 0.9% FLUSH
3.0000 mL | Freq: Two times a day (BID) | INTRAVENOUS | Status: DC
  Administered 2024-07-03 – 2024-07-04 (×2): 3 mL via INTRAVENOUS

## 2024-07-03 MED ORDER — MIDAZOLAM HCL 5 MG/5ML IJ SOLN
INTRAMUSCULAR | Status: AC
  Filled 2024-07-03: qty 5

## 2024-07-03 MED ORDER — FENTANYL CITRATE (PF) 100 MCG/2ML IJ SOLN
INTRAMUSCULAR | Status: AC
  Filled 2024-07-03: qty 2

## 2024-07-03 MED ORDER — OXYCODONE HCL 5 MG PO TABS
5.0000 mg | ORAL_TABLET | Freq: Once | ORAL | Status: AC
  Administered 2024-07-03: 5 mg via ORAL
  Filled 2024-07-03: qty 1

## 2024-07-03 MED ORDER — FENTANYL CITRATE (PF) 100 MCG/2ML IJ SOLN
INTRAMUSCULAR | Status: DC | PRN
  Administered 2024-07-03 (×2): 25 ug via INTRAVENOUS

## 2024-07-03 MED ORDER — ASPIRIN 81 MG PO CHEW
324.0000 mg | CHEWABLE_TABLET | Freq: Once | ORAL | Status: DC
  Filled 2024-07-03: qty 4

## 2024-07-03 MED ORDER — MIDAZOLAM HCL 5 MG/5ML IJ SOLN
INTRAMUSCULAR | Status: DC | PRN
  Administered 2024-07-03 (×2): 1 mg via INTRAVENOUS

## 2024-07-03 MED ORDER — POTASSIUM CHLORIDE CRYS ER 20 MEQ PO TBCR: 40.0000 meq | EXTENDED_RELEASE_TABLET | Freq: Once | ORAL | Status: DC

## 2024-07-03 MED ORDER — CEFAZOLIN SODIUM-DEXTROSE 1-4 GM/50ML-% IV SOLN
1.0000 g | Freq: Four times a day (QID) | INTRAVENOUS | Status: AC
  Administered 2024-07-03 – 2024-07-04 (×3): 1 g via INTRAVENOUS
  Filled 2024-07-03 (×3): qty 50

## 2024-07-03 MED ORDER — RANOLAZINE ER 500 MG PO TB12
500.0000 mg | ORAL_TABLET | Freq: Two times a day (BID) | ORAL | Status: DC
  Administered 2024-07-03 – 2024-07-04 (×2): 500 mg via ORAL
  Filled 2024-07-03 (×2): qty 1

## 2024-07-03 MED ORDER — LIDOCAINE HCL 1 % IJ SOLN
INTRAMUSCULAR | Status: AC
  Filled 2024-07-03: qty 40

## 2024-07-03 MED ORDER — ONDANSETRON HCL 4 MG/2ML IJ SOLN: 4.0000 mg | Freq: Four times a day (QID) | INTRAMUSCULAR | Status: DC | PRN

## 2024-07-03 MED ORDER — ACETAMINOPHEN 500 MG PO TABS
1000.0000 mg | ORAL_TABLET | Freq: Four times a day (QID) | ORAL | Status: DC | PRN
  Administered 2024-07-03 – 2024-07-04 (×2): 1000 mg via ORAL
  Filled 2024-07-03 (×2): qty 2

## 2024-07-03 MED ORDER — EMPAGLIFLOZIN 10 MG PO TABS
10.0000 mg | ORAL_TABLET | Freq: Every day | ORAL | Status: DC
  Administered 2024-07-04: 10 mg via ORAL
  Filled 2024-07-03: qty 1

## 2024-07-03 MED ORDER — SODIUM CHLORIDE 0.9 % IV SOLN
80.0000 mg | INTRAVENOUS | Status: AC
  Filled 2024-07-03: qty 2

## 2024-07-03 MED ORDER — ROSUVASTATIN CALCIUM 20 MG PO TABS
40.0000 mg | ORAL_TABLET | Freq: Every day | ORAL | Status: DC
  Administered 2024-07-03 – 2024-07-04 (×2): 40 mg via ORAL
  Filled 2024-07-03 (×2): qty 2

## 2024-07-03 MED ORDER — SODIUM CHLORIDE 0.9 % IV BOLUS
1000.0000 mL | Freq: Once | INTRAVENOUS | Status: AC
  Administered 2024-07-03: 1000 mL via INTRAVENOUS

## 2024-07-03 MED ORDER — SODIUM CHLORIDE 0.9 % IV SOLN
INTRAVENOUS | Status: AC | PRN
  Administered 2024-07-03: 250 mL via INTRAVENOUS

## 2024-07-03 MED ORDER — CEFAZOLIN SODIUM-DEXTROSE 2-4 GM/100ML-% IV SOLN: 2.0000 g | INTRAVENOUS | Status: AC

## 2024-07-03 SURGICAL SUPPLY — 8 items
CABLE SURGICAL S-101-97-12 (CABLE) ×1 IMPLANT
LEAD DURATA 7122Q-65CM (Lead) IMPLANT
PAD DEFIB RADIO PHYSIO CONN (PAD) ×1 IMPLANT
POUCH AIGIS-R ANTIBACT ICD LRG (Mesh General) IMPLANT
SHEATH 7FR PRELUDE SNAP 13 (SHEATH) IMPLANT
SHEATH PROBE COVER 6X72 (BAG) IMPLANT
TRAY PACEMAKER INSERTION (PACKS) ×1 IMPLANT
WIRE MICRO SET SILHO 5FR 7 (SHEATH) IMPLANT

## 2024-07-03 NOTE — ED Provider Notes (Signed)
 [place] EMERGENCY DEPARTMENT AT [facility] Provider Note   CSN: [id number] Arrival date & time: 07/03/24  1157     Patient presents with: Dizziness, Chest Pain, and Shortness of Breath   Whitney May is a 63 y.o. female.   HPI 63 year old female presents with chest pain and pacemaker malfunction.  Last night started getting chest pain that went from her left arm across her chest to her right arm.  She took some nitroglycerin  and it went away.  Today she had a visit and was told to go to the ER due to concern for lead malfunction of her device.  She had a brief episode of chest pain and shortness of breath while standing and was getting sweaty in the waiting room and her blood pressure was in the 50s.  Those symptoms are now resolved.  She took all of her blood pressure meds and other meds this morning but did not take any nitroglycerin  this morning.  Prior to Admission medications   Medication Sig Start Date End Date Taking? Authorizing Provider  acetaminophen  (TYLENOL ) 500 MG tablet Take 1,000 mg by mouth every 6 (six) hours as needed for moderate pain (pain score 4-6).    [provider]  aspirin  81 MG chewable tablet Chew 1 tablet (81 mg total) by mouth daily. 06/08/23   Glena Harlene HERO, FNP  bisoprolol  (ZEBETA ) 5 MG tablet TAKE 1/2 TABLET BY MOUTH ONCE DAILY (*prescriber requesting you make a FOLLOWING UP appointment 02/06/24*) 03/21/24   Rolan Ezra RAMAN, MD  calcium  carbonate (TUMS - DOSED IN MG ELEMENTAL CALCIUM ) 500 MG chewable tablet Chew 1 tablet by mouth daily as needed for indigestion or heartburn.    [provider]  furosemide  (LASIX ) 20 MG tablet Take 1 tablet (20 mg total) by mouth daily as needed for weight gain of 3 lbs in 24 hours or 5 lbs in a week 04/02/24 07/01/24  Lee, [place], NP  isosorbide  mononitrate (IMDUR ) 30 MG 24 hr tablet TAKE 1 TABLET BY MOUTH DAILY 04/02/24   McLean, Dalton S, MD  JARDIANCE  10 MG TABS tablet TAKE 1 TABLET BY MOUTH  DAILY BEFORE BREAKFAST 04/17/24   McLean, Dalton S, MD  levothyroxine  (SYNTHROID ) 100 MCG tablet Take 1 tablet (100 mcg total) by mouth daily at 6 (six) AM. 04/18/23 10/17/24  Christobal Guadalajara, MD  losartan  (COZAAR ) 25 MG tablet TAKE 1/2 TABLET BY MOUTH DAILY 06/09/24   McLean, Dalton S, MD  Multiple Vitamin (MULTIVITAMIN WITH MINERALS) TABS tablet Take 1 tablet by mouth daily. 09/25/15   Hongalgi, Anand D, MD  nitroGLYCERIN  (NITROSTAT ) 0.4 MG SL tablet Place 1 tablet (0.4 mg total) under the tongue every 5 (five) minutes x 3 doses as needed for chest pain. 07/05/23   Rolan Ezra RAMAN, MD  Omega-3 Fatty Acids (FISH OIL) 1200 MG CAPS Take 1,200 mg by mouth daily.    [provider]  potassium chloride  SA (KLOR-CON  M20) 20 MEQ tablet Take one tablet (20 mEq total) by mouth Thursday and Friday morning 05/14/24   Mealor, Augustus E, MD  ranolazine  (RANEXA ) 500 MG 12 hr tablet Take 1 tablet (500 mg total) by mouth 2 (two) times daily. PLEASE SCHEDULE APPOINTMENT FOR MORE REFILLS 06/23/24   Rolan Ezra RAMAN, MD  rosuvastatin  (CRESTOR ) 40 MG tablet Take 1 tablet (40 mg total) by mouth daily. PLEASE SCHEDULE APPOINTMENT FOR MORE REFILLS 06/23/24   Rolan Ezra RAMAN, MD  spironolactone  (ALDACTONE ) 25 MG tablet Take 0.5 tablets (12.5 mg total)  by mouth daily. 10/18/23 04/02/25  Lee, [place], NP  ticagrelor  (BRILINTA ) 90 MG TABS tablet Take 1 tablet (90 mg total) by mouth 2 (two) times daily. 08/28/23   Rolan Ezra RAMAN, MD    Allergies: Penicillins    Review of Systems  Constitutional:  Positive for diaphoresis. Negative for fever.  Respiratory:  Positive for shortness of breath. Negative for cough.   Cardiovascular:  Positive for chest pain.    Updated Vital Signs BP 91/65   Pulse 74   Resp 12   SpO2 98%   Physical Exam Vitals and nursing note reviewed.  Constitutional:      General: She is not in acute distress.    Appearance: She is well-developed. She is not ill-appearing or diaphoretic.  HENT:      Head: Normocephalic and atraumatic.  Cardiovascular:     Rate and Rhythm: Normal rate and regular rhythm.     Heart sounds: Normal heart sounds.  Pulmonary:     Effort: Pulmonary effort is normal.     Breath sounds: Normal breath sounds.  Abdominal:     Palpations: Abdomen is soft.     Tenderness: There is no abdominal tenderness.  Musculoskeletal:     Right lower leg: No edema.     Left lower leg: No edema.  Skin:    General: Skin is warm and dry.  Neurological:     Mental Status: She is alert.     (all labs ordered are listed, but only abnormal results are displayed) Labs Reviewed  CBC WITH DIFFERENTIAL/PLATELET - Abnormal; Notable for the following components:      Result Value   WBC 14.3 (*)    RBC 5.38 (*)    Hemoglobin 15.4 (*)    HCT 46.3 (*)    Neutro Abs 10.0 (*)    Monocytes Absolute 1.5 (*)    Basophils Absolute 0.2 (*)    Abs Granulocyte 10.0 (*)    All other components within normal limits  COMPREHENSIVE METABOLIC PANEL WITH GFR - Abnormal; Notable for the following components:   Potassium 3.3 (*)    CO2 20 (*)    Glucose, Bld 117 (*)    All other components within normal limits  HIV ANTIBODY (ROUTINE TESTING W REFLEX)  TROPONIN I (HIGH SENSITIVITY)  TROPONIN I (HIGH SENSITIVITY)    EKG: EKG Interpretation Date/Time:  Thursday July 03 2024 12:10:49 EDT Ventricular Rate:  73 PR Interval:  190 QRS Duration:  131 QT Interval:  441 QTC Calculation: 486 R Axis:   -46  Text Interpretation: Sinus rhythm Left bundle branch block Confirmed by Freddi Hamilton [phone]) on 07/03/2024 12:36:35 PM  Radiology: CUP PACEART REMOTE DEVICE CHECK Result Date: 07/02/2024 BIV ICD scheduled remote reviewed. Normal device function.  Presenting rhythm: AS-VP 2 NSVT events. Both appear oversensing of noise. Routing to triage per protocol since no hx of noise noted. 3 Noise REversions. Last recorded 06/24/24. Next remote 91 days. AB, CVRS    Procedures   Medications  Ordered in the ED  sodium chloride  0.9 % bolus 1,000 mL (1,000 mLs Intravenous New Bag/Given 07/03/24 1229)                                    Medical Decision Making Amount and/or Complexity of Data Reviewed Labs: ordered.    Details: Normal initial troponin Radiology: ordered and independent interpretation performed.    Details: No CHF ECG/medicine  tests: ordered and independent interpretation performed.    Details: Left bundle branch block  Risk OTC drugs. Decision regarding hospitalization.   Patient presents with hypotension that responded to fluids.  Ultimately found to have a perforation of her bleed from her pacemaker and cardiology will take her to the Cath Lab.  She has remained stable otherwise.  Admitted to cardiology.     Final diagnoses:  Perforation of cardiac device, initial encounter    ED Discharge Orders     None          Freddi Hamilton, MD 07/03/24 1640

## 2024-07-03 NOTE — Telephone Encounter (Addendum)
 Reviewed the information below, along with Dorise ([employer]), with Dr. Nancey.  Per Dr. Nancey, patient should come in to have HV therapies temporarily disabled and will need to be scheduled for a lead revision.  I called and spoke with the patient and advised her of the issues we are seeing with her RV lead.  Per the patient, she has been having intermittent chest pain over the last few days for which she has been taking NTG with relief.  At times the patient has experienced radiating pain to both her left and right arms.  The patient does have a history of CAD with prior stent placement.  Advised the patient I was uncertain if her chest/ radiating pain is related to her current RV lead position vs ischemia due to her known CAD.  Patient placed on a brief hold and reviewed symptoms again with [employer] rep and Dr. Nancey.  Per Dr. Nancey, the patient should report to the ER for further evaluation of her symptoms and RV lead.   The patient is aware of Dr. Marko recommendations and voices understanding.  I have advised her to report ASAP to the [facility] ER. The patient advised she will need to arrange for transportation. I again advised her she needs to go to the ER TODAY at [facility]. The patient is also advised to remain NPO until she can be evaluated and a plan of care is in place.  The patient again voices understanding and was appreciative of the call.   Dorise has notified Jodie Passey, [place] of the above and is aware to contact Dorise once the patient arrives at [facility] to have her HV therapies disabled until her lead revision.

## 2024-07-03 NOTE — ED Triage Notes (Signed)
 Sent by cardiology for lead revision for her pacemake. NPO since midnight. Orthostatic hypotension noted. Reports dizziness, SHOB and chest pain radiating to left harm since last night. Took nitroglycerin  last night.

## 2024-07-03 NOTE — Telephone Encounter (Signed)
 Received from [employer]: BIV ICD scheduled remote reviewed. Normal device function.  Presenting rhythm: AS-VP 2 NSVT events. Both appear oversensing of noise. Routing to triage per protocol since no hx of noise noted.  3 Noise REversions. Last recorded 06/24/24.  ___________________________________________________________________________  Transmission reviewed. RV sensing 1.0 mV (was 3.0 mV at in office check on 05/30/24). RV impedence is trending up at 730 ohms (was 450 ohms at in office check on 725/25). Noise noted on the RV lead. Phrenic stem noted on LV and RV threshold testing on 05/30/24.  Reviewed the patient's transmission with Dorise Regal, [employer] rep- concern regarding position of RV lead. Will review with Dr. Nancey.

## 2024-07-03 NOTE — H&P (Signed)
 Electrophysiology Pre-Procedure H&P  Primary Electrophysiologist Gus Mealor, MD   Primary Care Provider Bucio, Silvio BROCKS, FNP  I have reviewed the medical and surgical history, family history, review of systems, date of last menses (if applicable), current medications, allergies and sensitivities, physical examination, laboratory and diagnostic data reviewed and recorded on the H&P form.    Additional pertinent or relevant data:     EKG rhythm: AS/VP  BP 118/62   Pulse 64   Temp 98.1 F (36.7 C) (Oral)   Resp 13   SpO2 99%   General Appearance:  Alert, cooperative, no distress, appears stated age  Lungs:   Clear to auscultation bilaterally, respirations unlabored  Heart:  Regular rate and rhythm, normal S1 and S2, no murmurs/rubs/gallops  Extremities: Right Lower Extremity Edema none Right Upper Extremity Edema none Left Lower Extremity Edema none Left Upper Extremity Edema none  Skin: No prominent superficial veins over planned implantation site   Plan for procedure: Rvd lead revision for lead perforation  Alternative options: no lead revision   The procedures are indicated to evaluate and/or treat (diagnosis): CRTD revision with Rvd lead perforation   Plan for General anesthesia: per anesthesia  Plan for blood product transfusion: transfusion not anticipated  Additional diagnostic data needed: none  Current anticoagulation: None, DAPT (ASA/ticag)   Plan for post anticoagulation: None   Discharge planning: CVSSU observation/overnight  Additional physical, mental, neurologic status considerations and needs: none  Risks of the procedure were discussed with patient  today, including but not limited to vascular access injury, bleeding, pneumothorax, infection, hematoma, cardiac perforation, tamponade, and death. All of the patient's questions were answered and informed consents are placed into the patient's record.    Donnice DELENA Primus, MD [facility]   Cardiac Electrophysiology

## 2024-07-03 NOTE — Progress Notes (Addendum)
 Echocardiogram 2D Echocardiogram has been performed. Called EP to bedside for critical results.  Damien FALCON Dedee Liss RDCS 07/03/2024, 1:26 PM

## 2024-07-03 NOTE — H&P (Signed)
 ELECTROPHYSIOLOGY CONSULT NOTE    Patient ID: MYAN LOCATELLI MRN: [id number], DOB/AGE: [birth year] 63 y.o.  Admit date: 07/03/2024 Date of Consult: 07/03/2024  Primary Physician: Alston Silvio BROCKS, FNP Primary Cardiologist: Ezra Shuck, MD  Electrophysiologist: Dr. Nancey   Reason for admission: ICD lead malfunction; likely perforation  Patient Profile: Whitney May is a 63 y.o. female with a history of CAD s/p RCA stent and chronic systolic CHF s/p [employer] CRT-D who is being seen today for the evaluation of possible lead perforation at the request of Dr. Freddi.  HPI:  Whitney May is a 63 y.o. female with medical history as above.   She had persistent LV dysfunction despite GDMT and with LBBB underwent CRT implantation 05/16/2024  At wound check 7/25 she had phrenic stim noted on LV and RV threshold testing. Cap confirm turned off, RV lead turned OFF and CXR and limited echo ordered.   CXR appeared stable. ECho not yet done, potentially deferred with normal CXR.   This am Device clinic received an alert for false NSVT in the setting of RV lead noise with multiple noise reversions.  We called pt who also complained of intermittent chest pain using NTG with some relief. Also having pain into both arms.  Instructed to remain NPO and report to ED.   CXR with change in slack of RV lead.  Limited echo with clear lead macro-perforation.   Dr. Almetta in to see. Potentially planning for lead revision this afternoon pending lab availability. Labs pending. Low suspicion of ACS.   Labs       PLT  231 (08/28 1303) HGB  15.4* (08/28 1303) WBC 14.3* (08/28 1303)  .    Past Medical History:  Diagnosis Date   Alcohol use    CAD (coronary artery disease)    Chest pain    Hyperlipidemia, mixed    Hypotension    Hypothyroidism    Ischemic cardiomyopathy    LBBB (left bundle branch block)    Tobacco abuse      Surgical History:  Past Surgical History:  Procedure Laterality  Date   BIV ICD INSERTION CRT-D N/A 05/16/2024   Procedure: BIV ICD INSERTION CRT-D;  Surgeon: Nancey Eulas BRAVO, MD;  Location: [facility] INVASIVE CV LAB;  Service: Cardiovascular;  Laterality: N/A;   CESAREAN SECTION     CORONARY STENT INTERVENTION N/A 03/07/2019   Procedure: CORONARY STENT INTERVENTION;  Surgeon: Wonda Sharper, MD;  Location: [facility] INVASIVE CV LAB;  Service: Cardiovascular;  Laterality: N/A;   CORONARY/GRAFT ACUTE MI REVASCULARIZATION N/A 08/02/2018   Procedure: Coronary/Graft Acute MI Revascularization;  Surgeon: Anner Alm ORN, MD;  Location: [facility] INVASIVE CV LAB;  Service: Cardiovascular;  Laterality: N/A;   CORONARY/GRAFT ACUTE MI REVASCULARIZATION N/A 09/18/2022   Procedure: Coronary/Graft Acute MI Revascularization;  Surgeon: Court Dorn PARAS, MD;  Location: [facility] INVASIVE CV LAB;  Service: Cardiovascular;  Laterality: N/A;   LEFT HEART CATH AND CORONARY ANGIOGRAPHY N/A 08/02/2018   Procedure: LEFT HEART CATH AND CORONARY ANGIOGRAPHY;  Surgeon: Anner Alm ORN, MD;  Location: [facility] INVASIVE CV LAB;  Service: Cardiovascular;  Laterality: N/A;   LEFT HEART CATH AND CORONARY ANGIOGRAPHY N/A 03/07/2019   Procedure: LEFT HEART CATH AND CORONARY ANGIOGRAPHY;  Surgeon: Cherrie Toribio SAUNDERS, MD;  Location: [facility] INVASIVE CV LAB;  Service: Cardiovascular;  Laterality: N/A;   LEFT HEART CATH AND CORONARY ANGIOGRAPHY N/A 09/18/2022   Procedure: LEFT HEART CATH AND CORONARY ANGIOGRAPHY;  Surgeon: Court Dorn PARAS, MD;  Location: [facility]  INVASIVE CV LAB;  Service: Cardiovascular;  Laterality: N/A;   LEFT HEART CATH AND CORONARY ANGIOGRAPHY N/A 04/16/2023   Procedure: LEFT HEART CATH AND CORONARY ANGIOGRAPHY;  Surgeon: Anner Alm ORN, MD;  Location: [facility] INVASIVE CV LAB;  Service: Cardiovascular;  Laterality: N/A;     (Not in a hospital admission)   Inpatient Medications:   aspirin   324 mg Oral Once    Allergies:  Allergies  Allergen Reactions   Penicillins Nausea And Vomiting    Fever    Family  History  Problem Relation Age of Onset   Coronary artery disease Mother    Coronary artery disease Maternal Grandmother    Hypertension Father    Diabetes Father      Physical Exam: Vitals:   07/03/24 1210 07/03/24 1215 07/03/24 1225 07/03/24 1226  BP: (!) 88/67 99/63 91/65    Pulse:  70 74   Resp: 17 10 12    Temp:   98.1 F (36.7 C)   TempSrc:   Oral   SpO2:  97% 96% 98%    GEN- NAD, A&O x 3, normal affect HEENT: Normocephalic, atraumatic Lungs- CTAB, Normal effort.  Heart- Regular rate and rhythm, No M/G/R.  GI- Soft, NT, ND.  Extremities- No clubbing, cyanosis, or edema   Radiology/Studies: DG Chest Portable 1 View Result Date: 07/03/2024 EXAM: 1 VIEW XRAY OF THE CHEST 07/03/2024 12:43:00 PM COMPARISON: None available. CLINICAL HISTORY: Chest pain, dyspnea. Sent by cardiology for lead revision for her pacemaker. NPO since midnight. Orthostatic hypotension noted. Reports dizziness, shortness of breath, and chest pain radiating to left arm since last night. Took nitroglycerin  last night. FINDINGS: LUNGS AND PLEURA: No focal pulmonary opacity. No pulmonary edema. No pleural effusion. No pneumothorax. HEART AND MEDIASTINUM: No acute abnormality of the cardiac and mediastinal silhouettes. BONES AND SOFT TISSUES: No acute osseous abnormality. LINES AND TUBES: Left subclavian 3-lead pacer noted, unchanged in position since prior examination. IMPRESSION: 1. No acute process. 2. Left subclavian 3-lead pacer, unchanged in position since prior examination. Electronically signed by: Dorethia Molt MD 07/03/2024 01:33 PM EDT RP Workstation: [id number]   CUP PACEART REMOTE DEVICE CHECK Result Date: 07/02/2024 BIV ICD scheduled remote reviewed. Normal device function.  Presenting rhythm: AS-VP 2 NSVT events. Both appear oversensing of noise. Routing to triage per protocol since no hx of noise noted. 3 Noise REversions. Last recorded 06/24/24. Next remote 91 days. AB, CVRS   EKG:on arrival shows  NSR with LBBB at 131 bpm (personally reviewed)  TELEMETRY: NSR 70s (personally reviewed)  DEVICE HISTORY: Abbott CRT-D Left bundle area lead and RV HV lead implanted 05/16/2024  Assessment/Plan:  RV lead macro-perforation With noise on RV lead, diaphragmatic stim, and lead perforation  Explained risks, benefits, and alternatives to ICD lead revision, including but not limited to bleeding, infection, pneumothorax, pericardial effusion, lead dislodgement, heart attack, stroke, or death.  Pt verbalized understanding and agrees to proceed.      CAD No s/s of ischemia.    Chest discomfort likely in setting of perforation Hold brilinta   Chronic systolic CHF LBBB ICD programmed off to avoid inappropriate therapies.  Will plan for lead revision as above.   Dr. Almetta has seen. Lead revision timing lab availability and discussion with EP MD team.   For questions or updates, please contact [facility] Please consult [email or web address] for contact info under     Signed, Ozell Prentice Passey, PA-C  07/03/2024, 1:39 PM

## 2024-07-04 ENCOUNTER — Encounter (HOSPITAL_COMMUNITY): Payer: Self-pay | Admitting: Cardiology

## 2024-07-04 ENCOUNTER — Observation Stay (HOSPITAL_COMMUNITY)

## 2024-07-04 DIAGNOSIS — T82190A Other mechanical complication of cardiac electrode, initial encounter: Secondary | ICD-10-CM | POA: Diagnosis not present

## 2024-07-04 DIAGNOSIS — R079 Chest pain, unspecified: Secondary | ICD-10-CM

## 2024-07-04 DIAGNOSIS — E039 Hypothyroidism, unspecified: Secondary | ICD-10-CM | POA: Diagnosis not present

## 2024-07-04 DIAGNOSIS — Z7982 Long term (current) use of aspirin: Secondary | ICD-10-CM | POA: Diagnosis not present

## 2024-07-04 DIAGNOSIS — I2510 Atherosclerotic heart disease of native coronary artery without angina pectoris: Secondary | ICD-10-CM | POA: Diagnosis not present

## 2024-07-04 LAB — ECHOCARDIOGRAM LIMITED
Height: 62 in
S' Lateral: 5.18 cm
Weight: 2195.2 [oz_av]

## 2024-07-04 MED ORDER — OXYCODONE HCL 5 MG PO TABS: 5.0000 mg | ORAL_TABLET | Freq: Three times a day (TID) | ORAL | 0 refills | Status: AC

## 2024-07-04 MED ORDER — OXYCODONE HCL 5 MG PO TABS: 5.0000 mg | ORAL_TABLET | ORAL | Status: DC | PRN

## 2024-07-04 MED ORDER — OXYCODONE HCL 5 MG PO TABS
5.0000 mg | ORAL_TABLET | Freq: Once | ORAL | Status: AC
  Administered 2024-07-04: 5 mg via ORAL
  Filled 2024-07-04: qty 1

## 2024-07-04 MED ORDER — ACETAMINOPHEN 325 MG PO TABS
975.0000 mg | ORAL_TABLET | Freq: Three times a day (TID) | ORAL | Status: DC
  Administered 2024-07-04: 975 mg via ORAL
  Filled 2024-07-04: qty 3

## 2024-07-04 NOTE — TOC Transition Note (Signed)
 Transition of Care [facility]) - Discharge Note   Patient Details  Name: Whitney May MRN: [id number] Date of Birth: Sep 08, 1961  [facility] CM/SW Contact:  Waddell Barnie Rama, RN Phone Number: 07/04/2024, 11:08 AM   Clinical Narrative:    For dc today, she will need cab voucher in [employer].         Patient Goals and CMS Choice            Discharge Placement                       Discharge Plan and Services Additional resources added to the After Visit Summary for                                       Social Drivers of Health (SDOH) Interventions SDOH Screenings   Food Insecurity: Patient Declined (07/03/2024)  Housing: Unknown (07/03/2024)  Transportation Needs: Patient Declined (07/03/2024)  Utilities: Patient Declined (07/03/2024)  Social Connections: Unknown (03/18/2022)   Received from [facility]  Tobacco Use: High Risk (07/03/2024)     Readmission Risk Interventions     No data to display

## 2024-07-04 NOTE — TOC CM/SW Note (Signed)
 Transition of Care [facility]) - Inpatient Brief Assessment   Patient Details  Name: Whitney May MRN: [id number] Date of Birth: 28-Apr-1961  Transition of Care [facility]) CM/SW Contact:    Waddell Barnie Rama, RN Phone Number: 07/04/2024, 11:06 AM   Clinical Narrative: From home with son and daughter, has PCP and insurance on file, states has no HH services in place at this time or DME at home.  States  she will need assist with transport at dc. family is support system, states gets medications from [facility].  Pta self ambulatory.        Transition of Care Asessment: Insurance and Status: Insurance coverage has been reviewed Patient has primary care physician: Yes Home environment has been reviewed: home with son and daughter Prior level of function:: indep Prior/Current Home Services: No current home services Social Drivers of Health Review: SDOH reviewed no interventions necessary Readmission risk has been reviewed: Yes Transition of care needs: transition of care needs identified, TOC will continue to follow

## 2024-07-04 NOTE — Discharge Instructions (Signed)
 After Your Lead Revision  Do not lift your arm above shoulder height for 1 week after your procedure. After 7 days, you may progress as below.  You should remove your sling 24 hours after your procedure, unless otherwise instructed by your provider.     Friday July 11, 2024  Saturday July 12, 2024 Sunday July 13, 2024 Monday July 14, 2024   Do not lift, push, pull, or carry anything over 10 pounds with the affected arm until 6 weeks (Friday August 15, 2024) after your procedure.   Do not drive until your wound check or until instructed by your healthcare provider that you are safe to do so.   Monitor your surgical site for redness, swelling, and drainage. Call the device clinic at [phone] if you experience these symptoms or fever/chills.  If your incision is sealed with Steri-strips or staples. You may shower 7 days after your procedure and wash your incision with soap and water as long as it is healed. If your incision is closed with Dermabond/Surgical glue. You may shower 1 day after your pacemaker implant and wash your incision with soap and water. Avoid lotions, ointments, or perfumes over your incision until it is well-healed.  You may use a hot tub or a pool AFTER your wound check appointment if the incision is completely closed.   Your cardiac device may be MRI compatible. We will discuss this at your office visit/Wound check  Remote monitoring is used to monitor your cardiac device from home. This monitoring is scheduled every 91 days by our office. It allows us  to keep an eye on the functioning of your device to ensure it is working properly. You will routinely see your Electrophysiologist annually (more often if necessary).   Implantable Cardiac Device Lead Replacement, Care After This sheet gives you information about how to care for yourself after your procedure. Your health care provider may also give you more specific instructions. If you have problems or  questions, contact your health care provider. What can I expect after the procedure? After your procedure, it is common to have: Mild discomfort at the incision site. A small amount of drainage or bleeding at the incision site. This is usually no more than a spot. Follow these instructions at home: Incision care        Follow instructions from your health care provider about how to take care of your incision. Make sure you: Leave stitches (sutures), skin glue, or adhesive strips in place. These skin closures may need to stay in place for 2 weeks or longer. If adhesive strip edges start to loosen and curl up, you may trim the loose edges. Do not remove adhesive strips completely unless your health care provider tells you to do that. Check your incision area every day for signs of infection. Check for: More redness, swelling, or pain. More fluid or blood. Warmth. Pus or a bad smell. Electric and Engineer, building services cell phones should be kept 12 inches (30 cm) away from the cardiac device when they are on. When talking on a cell phone, use the ear on the opposite side of your cardiac device. Do not place a cell phone in a pocket next to the cardiac device. Household appliances do not interfere with modern-day cardiac device. Medicines Take over-the-counter and prescription medicines only as told by your health care provider. General instructions Do not raise the arm on the side of your procedure higher than your shoulder for at least 7 days. Except  for this restriction, continue to use your arm as normal to prevent problems. Do not take baths, swim, or use a hot tub until your health care provider says it is okay to do so. You may shower as directed by your health care provider. Do not lift anything that is heavier than 10 lb (4.5 kg) for 6 weeks or the limit that your health care provider tells you, until he or she says that it is safe. Return to your normal activities after 2 weeks, or  as told by your health care provider. Ask your health care provider what activities are safe for you. Keep all follow-up visits as told by your health care provider. This is important. Contact a health care provider if: You have more redness, swelling, or pain around your incision. You have more fluid or blood coming from your incision. Your incision feels warm to the touch. You have pus or a bad smell coming from your incision. You have a fever. The arm or hand on the side of the cardiac device becomes swollen. The symptoms you had before your procedure are not getting better. Get help right away if: You develop chest pain. You feel like you will faint. You feel light-headed. You faint. Summary Check your incision area every day for signs of infection, such as more fluid or blood. A small amount of drainage or bleeding at the incision site is normal. Do not raise the arm on the side of your procedure higher than your shoulder for at least 5 days, or as long as directed by your health care provider. Digital cell phones should be kept 12 inches (30 cm) away from the cardiac device when they are on. When talking on a cell phone, use the ear on the opposite side of your cardiac device. If the symptoms that led to having your lead replaced are not getting better, contact your health care provider. This information is not intended to replace advice given to you by your health care provider. Make sure you discuss any questions you have with your health care provider.

## 2024-07-04 NOTE — Progress Notes (Addendum)
 Patient seen and examined s/p RV defibrillator lead revision.  Chest x-ray with stable lead position, device interrogation with stable lead parameters and EKG with RVp pacing.  Moderate amount of pain overnight but otherwise no issues.  Postop echo without effusion.  Repeat limited echo this morning to ensure no evolving effusion.  If echo normal can plan to discharge home today.  Resume DAPT today with aspirin /Brilinta  with prior history of ISR a year ago.  No significant bleeding at site.  Discharged with short prescription of oxycodone  and Tylenol  950 mg 3 times daily for 3 days.  New RV defib lead programmed back above threshold.  Whitney DELENA Primus, MD [facility]  Cardiac Electrophysiology

## 2024-07-04 NOTE — Progress Notes (Signed)
 Echocardiogram 2D Echocardiogram has been performed.  Tinnie FORBES Gosling RDCS 07/04/2024, 9:47 AM

## 2024-07-04 NOTE — Discharge Summary (Addendum)
 ELECTROPHYSIOLOGY PROCEDURE DISCHARGE SUMMARY    Patient ID: Whitney May,  MRN: [id number], DOB/AGE: 13-Jul-1961 63 y.o.  Admit date: 07/03/2024 Discharge date: 07/04/2024  Primary Care Physician: Alston Silvio BROCKS, FNP  Primary Cardiologist: Ezra Shuck, MD  Electrophysiologist: Dr. Nancey    Primary Diagnosis:  RV lead perforation  Secondary Diagnosis: Chronic systolic CHF  Allergies  Allergen Reactions   Penicillins Nausea And Vomiting    Fever     Procedures This Admission:  1.  Explantation of an Abbot RV lead with perforation demonstrated on Echo.   2. Re-implantation of new RV Durata 7122 lead on 07/03/2024  by Dr. Almetta.   3.  CXR on 07/04/24 demonstrated no pneumothorax status post device implantation. 4. Follow up limited Echo today demonstrated no significant pericardial effusion      Brief HPI: Whitney May is a 63 y.o. female was previously admitted for ICD implantation.  There were concerns for possible microperf at wound check. CXR however was unremarkable. On 8/28 device clinic received an alert that pt had noise on her RV lead. Pt called and complained of chest pain, highly concerning for lead perforation given prior concerns and history. Pt instructed to present to ED, where limited echo did reveal macro-lead perforation. She was taken to the lab for lead revision.   Hospital Course:  The patient was admitted and underwent lead revision of a Abbott BiV ICD with details as outlined above. They were monitored on telemetry overnight which demonstrated appropriate pacing .  Left chest was without hematoma or ecchymosis.  The device was interrogated and found to be functioning normally.  CXR was obtained and demonstrated no pneumothorax status post device implantation. Limited echo repeated which showed no significant effusion.  Wound care, arm mobility, and restrictions were reviewed with the patient.  The patient was examined and considered stable for  discharge to home.   The patient's discharge medications include an ACE-I/ARB/ARNI (losartan ) and beta blocker (bisoprolol ).  Anticoagulation resumption Resume Brilinta  MONDAY, 9/1.  Physical Exam: Vitals:   07/03/24 1927 07/03/24 2347 07/04/24 0511 07/04/24 0733  BP: 106/80 96/76 131/71 125/69  Pulse: 78 74 73 72  Resp: 18 18 18 20   Temp: 98.1 F (36.7 C) 97.9 F (36.6 C) 97.8 F (36.6 C) 98.3 F (36.8 C)  TempSrc: Oral Oral Oral Oral  SpO2: 96% 96% 97% 97%  Weight:   62.2 kg   Height:        GEN- NAD. A&O x 3.  HEENT: Normocephalic, atraumatic Lungs- CTAB, normal effort.  Heart- RRR. No M/G/R.  GI- Soft, NT, ND.  Extremities- No clubbing, cyanosis, or edema Skin- Warm and dry, no rash or lesion. ICD site stable.  Discharge Medications:  Allergies as of 07/04/2024       Reactions   Penicillins Nausea And Vomiting   Fever        Medication List     TAKE these medications    acetaminophen  500 MG tablet Commonly known as: TYLENOL  Take 1,000 mg by mouth every 6 (six) hours as needed for moderate pain (pain score 4-6).   aspirin  81 MG chewable tablet Chew 1 tablet (81 mg total) by mouth daily.   bisoprolol  5 MG tablet Commonly known as: ZEBETA  TAKE 1/2 TABLET BY MOUTH ONCE DAILY (*prescriber requesting you make a FOLLOWING UP appointment 02/06/24*)   calcium  carbonate 500 MG chewable tablet Commonly known as: TUMS - dosed in mg elemental calcium  Chew 1 tablet by mouth daily  as needed for indigestion or heartburn.   furosemide  20 MG tablet Commonly known as: LASIX  Take 1 tablet (20 mg total) by mouth daily as needed for weight gain of 3 lbs in 24 hours or 5 lbs in a week   isosorbide  mononitrate 30 MG 24 hr tablet Commonly known as: IMDUR  TAKE 1 TABLET BY MOUTH DAILY   Jardiance  10 MG Tabs tablet Generic drug: empagliflozin  TAKE 1 TABLET BY MOUTH DAILY BEFORE BREAKFAST   levothyroxine  100 MCG tablet Commonly known as: SYNTHROID  Take 1 tablet (100  mcg total) by mouth daily at 6 (six) AM.   losartan  25 MG tablet Commonly known as: COZAAR  TAKE 1/2 TABLET BY MOUTH DAILY   nitroGLYCERIN  0.4 MG SL tablet Commonly known as: NITROSTAT  Place 1 tablet (0.4 mg total) under the tongue every 5 (five) minutes x 3 doses as needed for chest pain.   oxyCODONE  5 MG immediate release tablet Commonly known as: Oxy IR/ROXICODONE  Take 1 tablet (5 mg total) by mouth every 8 (eight) hours for 3 days.   potassium chloride  SA 20 MEQ tablet Commonly known as: Klor-Con  M20 Take one tablet (20 mEq total) by mouth Thursday and Friday morning   ranolazine  500 MG 12 hr tablet Commonly known as: RANEXA  Take 1 tablet (500 mg total) by mouth 2 (two) times daily. PLEASE SCHEDULE APPOINTMENT FOR MORE REFILLS   rosuvastatin  40 MG tablet Commonly known as: CRESTOR  Take 1 tablet (40 mg total) by mouth daily. PLEASE SCHEDULE APPOINTMENT FOR MORE REFILLS   spironolactone  25 MG tablet Commonly known as: ALDACTONE  Take 0.5 tablets (12.5 mg total) by mouth daily.   ticagrelor  90 MG Tabs tablet Commonly known as: BRILINTA  Take 1 tablet (90 mg total) by mouth 2 (two) times daily.        Disposition: Home with usual follow up as in AVS  Duration of Discharge Encounter:  APP time: 26 minutes  Signed, Ozell Prentice Passey, PA-C  07/04/2024 10:39 AM

## 2024-07-08 ENCOUNTER — Other Ambulatory Visit (HOSPITAL_COMMUNITY): Payer: Self-pay | Admitting: Cardiology

## 2024-07-10 ENCOUNTER — Ambulatory Visit: Payer: Self-pay | Admitting: Cardiovascular Disease

## 2024-07-17 ENCOUNTER — Ambulatory Visit: Attending: Cardiology

## 2024-07-17 DIAGNOSIS — I5022 Chronic systolic (congestive) heart failure: Secondary | ICD-10-CM | POA: Insufficient documentation

## 2024-07-17 DIAGNOSIS — I447 Left bundle-branch block, unspecified: Secondary | ICD-10-CM | POA: Insufficient documentation

## 2024-07-17 LAB — CUP PACEART INCLINIC DEVICE CHECK
Battery Remaining Longevity: 97 mo
Brady Statistic RA Percent Paced: 9.3 %
Brady Statistic RV Percent Paced: 99.77 %
Date Time Interrogation Session: 20250911150322
HighPow Impedance: 82.125
Implantable Lead Connection Status: 753985
Implantable Lead Connection Status: 753985
Implantable Lead Connection Status: 753985
Implantable Lead Implant Date: 20250711
Implantable Lead Implant Date: 20250711
Implantable Lead Implant Date: 20250828
Implantable Lead Location: 753858
Implantable Lead Location: 753859
Implantable Lead Location: 753860
Implantable Pulse Generator Implant Date: 20250711
Lead Channel Impedance Value: 487.5 Ohm
Lead Channel Impedance Value: 525 Ohm
Lead Channel Impedance Value: 587.5 Ohm
Lead Channel Pacing Threshold Amplitude: 0.75 V
Lead Channel Pacing Threshold Amplitude: 0.75 V
Lead Channel Pacing Threshold Amplitude: 1 V
Lead Channel Pacing Threshold Amplitude: 1 V
Lead Channel Pacing Threshold Pulse Width: 0.3 ms
Lead Channel Pacing Threshold Pulse Width: 0.3 ms
Lead Channel Pacing Threshold Pulse Width: 0.5 ms
Lead Channel Pacing Threshold Pulse Width: 0.5 ms
Lead Channel Sensing Intrinsic Amplitude: 12 mV
Lead Channel Sensing Intrinsic Amplitude: 2.9 mV
Lead Channel Setting Pacing Amplitude: 1.75 V
Lead Channel Setting Pacing Amplitude: 1.75 V
Lead Channel Setting Pacing Amplitude: 2 V
Lead Channel Setting Pacing Pulse Width: 0.3 ms
Lead Channel Setting Pacing Pulse Width: 0.5 ms
Lead Channel Setting Sensing Sensitivity: 0.5 mV
Pulse Gen Serial Number: 211050445
Zone Setting Status: 755011

## 2024-07-17 NOTE — Patient Instructions (Signed)

## 2024-07-17 NOTE — Progress Notes (Signed)
 Normal multi chamber ICD wound check. Wound well healed. Presenting rhythm: AS/BP 70. Routine testing performed. Thresholds, sensing, and impedance consistent with implant measurements. No treated arrhythmias. Reviewed arm restrictions to continue for 6 weeks total post op. Reviewed shock plan.  Pt enrolled in remote follow-up.

## 2024-07-21 NOTE — Progress Notes (Signed)
Remote ICD Transmission.

## 2024-07-26 ENCOUNTER — Ambulatory Visit: Payer: Self-pay | Admitting: Cardiovascular Disease

## 2024-08-27 ENCOUNTER — Other Ambulatory Visit (HOSPITAL_COMMUNITY): Payer: Self-pay | Admitting: Cardiology

## 2024-08-27 DIAGNOSIS — I5022 Chronic systolic (congestive) heart failure: Secondary | ICD-10-CM

## 2024-08-27 DIAGNOSIS — I2510 Atherosclerotic heart disease of native coronary artery without angina pectoris: Secondary | ICD-10-CM

## 2024-09-04 ENCOUNTER — Other Ambulatory Visit (HOSPITAL_COMMUNITY): Payer: Self-pay | Admitting: Cardiology

## 2024-09-06 ENCOUNTER — Other Ambulatory Visit (HOSPITAL_COMMUNITY): Payer: Self-pay | Admitting: Cardiology

## 2024-09-12 ENCOUNTER — Encounter: Admitting: Cardiovascular Disease

## 2024-09-22 ENCOUNTER — Telehealth (HOSPITAL_COMMUNITY): Payer: Self-pay

## 2024-09-22 ENCOUNTER — Other Ambulatory Visit (HOSPITAL_COMMUNITY): Payer: Self-pay

## 2024-09-22 NOTE — Telephone Encounter (Signed)
 Advanced Heart Failure Patient Advocate Encounter  Prior authorization for Jardiance  has been submitted and approved. Test billing returns $4 for 90 day supply.  Key: [id number] Effective: 09/22/2024 to 09/22/2025  Rachel DEL, CPhT Rx Patient Advocate Phone: [phone]

## 2024-09-25 ENCOUNTER — Other Ambulatory Visit (HOSPITAL_COMMUNITY): Payer: Self-pay

## 2024-10-01 ENCOUNTER — Ambulatory Visit

## 2024-10-01 DIAGNOSIS — I5022 Chronic systolic (congestive) heart failure: Secondary | ICD-10-CM | POA: Diagnosis not present

## 2024-10-03 LAB — CUP PACEART REMOTE DEVICE CHECK
Battery Remaining Longevity: 97 mo
Battery Remaining Percentage: 94 %
Battery Voltage: 3.02 V
Brady Statistic AP VP Percent: 11 %
Brady Statistic AP VS Percent: 1 %
Brady Statistic AS VP Percent: 89 %
Brady Statistic AS VS Percent: 1 %
Brady Statistic RA Percent Paced: 10 %
Date Time Interrogation Session: 20251126020016
HighPow Impedance: 87 Ohm
Implantable Lead Connection Status: 753985
Implantable Lead Connection Status: 753985
Implantable Lead Connection Status: 753985
Implantable Lead Implant Date: 20250711
Implantable Lead Implant Date: 20250711
Implantable Lead Implant Date: 20250828
Implantable Lead Location: 753858
Implantable Lead Location: 753859
Implantable Lead Location: 753860
Implantable Pulse Generator Implant Date: 20250711
Lead Channel Impedance Value: 460 Ohm
Lead Channel Impedance Value: 550 Ohm
Lead Channel Impedance Value: 700 Ohm
Lead Channel Pacing Threshold Amplitude: 0.625 V
Lead Channel Pacing Threshold Amplitude: 0.875 V
Lead Channel Pacing Threshold Amplitude: 1 V
Lead Channel Pacing Threshold Pulse Width: 0.3 ms
Lead Channel Pacing Threshold Pulse Width: 0.5 ms
Lead Channel Pacing Threshold Pulse Width: 0.5 ms
Lead Channel Sensing Intrinsic Amplitude: 12 mV
Lead Channel Sensing Intrinsic Amplitude: 3.1 mV
Lead Channel Setting Pacing Amplitude: 1.625
Lead Channel Setting Pacing Amplitude: 1.875
Lead Channel Setting Pacing Amplitude: 2 V
Lead Channel Setting Pacing Pulse Width: 0.3 ms
Lead Channel Setting Pacing Pulse Width: 0.5 ms
Lead Channel Setting Sensing Sensitivity: 0.5 mV
Pulse Gen Serial Number: 211050445
Zone Setting Status: 755011

## 2024-10-06 NOTE — Progress Notes (Signed)
 Remote ICD Transmission

## 2024-10-07 ENCOUNTER — Ambulatory Visit: Payer: Self-pay | Admitting: Cardiovascular Disease

## 2024-10-10 ENCOUNTER — Ambulatory Visit: Admitting: Cardiovascular Disease

## 2024-10-16 ENCOUNTER — Other Ambulatory Visit (HOSPITAL_COMMUNITY): Payer: Self-pay | Admitting: Cardiology

## 2024-10-27 ENCOUNTER — Other Ambulatory Visit (HOSPITAL_COMMUNITY): Payer: Self-pay | Admitting: Cardiology

## 2024-10-27 DIAGNOSIS — I5022 Chronic systolic (congestive) heart failure: Secondary | ICD-10-CM

## 2024-10-27 DIAGNOSIS — I2510 Atherosclerotic heart disease of native coronary artery without angina pectoris: Secondary | ICD-10-CM

## 2024-11-12 NOTE — Progress Notes (Signed)
 " Cardiology Office Note:  .   Date:  11/12/2024  ID:  Whitney May, DOB 06/21/1961, MRN [id number] PCP: Alston Silvio BROCKS, FNP  [facility] Providers Cardiologist:  Ezra Shuck, MD Electrophysiologist:  Eulas FORBES Furbish, MD {  History of Present Illness: .   Whitney May is a 64 y.o. female w/PMHx of  HTN, HLD, hypothyroidism Hx of heavy ETOH CAD  --NSTEMI in 2019 with stent to RCA  --NSTEMI in 2020 2/2 plaque rupture of RCA in-stent with 2 overlying stents to RCA --STEMI 11/23 with CP, ran out of Plavix  for several days. EKG showed LBBB and prominent inferior ST elevation. She had emergent cath for inferior STEMI with 90% ISR mid-distal RCA, 99% stenosis ostial PDA and 95% stenosis ostial PLV. Patient had PTCA to the long mid-distal RCA stented segment and DES to ostial PLV and PDA.  --NSTEMI, June 2024, Transiently required NE for hypotension>>  ISR in distal RCA, and new stenosis in proximal RCA, planned medical management  PAD with long occlusion left SFA ICM, chronic CHF ICD LBBB  CRT-D implanted 05/16/24 At her wound check visit phrenic stim was noted, in noted, device RN in d/w industry : LV cap confirm was previously turned on and STIM was noted from the RV back up pulse. Industry rep recommended turning off LV CC  CXR and echo recommended by Dr. Furbish  07/03/24 referred to ER for abnormal remote with worsening lead noise, significant decrease in impedance and phrenic stimulation all consistent with lead perforation. CXR reviewed and lead with more apical displacement. Stat echo confirmed RV ICD lead perforation.  She underwent RV ICD lead removal > new lead implanted Discharged 07/04/24  07/17/24: wound visit with stable findings  Today's visit is scheduled as her 90 day post implant > lead revision visit ROS:   She is accompanied by her son. Initially reported that at the very lateral edge of her wound for several weeks seemed to have a stitch there, but  eventually seemed to fall aff. She was pretty tender/sensitive at the site, but this as well, has gone away.  She remains with poor exertional capacity with SOB She says that sometimes she feels like she is winded even at rest Hx of heavy smoke upwards of 2pk/day, but her sone reports she has reduced that quite a bit to <1pk/day No formal diagnosis of COPD/emphysema No swelling, intermittently feels quite bloated, doesn't/hasn't used her PRN lasix , seems to just come/go Her ride dropped her off at the hospital [facility] > she walked across the street and up to our office, had to stop once to rest to settle her breathing, no CP  She has taken s/l NTG x2 in the last 5-6 mo, no CP otherwise, says her current medications work for her really well.  Lightheaded: mostly noted when up and moving around, but sometimes gets waves of feeling weak/lightheaded/dizzy even seated   Device information Abbott CRT-D implanted 05/16/24 C/b lead dislodgement and perforation RV lead removed, new lead implanted 07/03/24    Studies Reviewed: SABRA    EKG done today and reviewed by myself:  Post programming change (LV only) SR/V paced QRS  07/04/24: SR/BP, no R wave V1, + lead I, QRS (appears to be programmed BiVe pacing with this EKG, by review of device checks)  DEVICE interrogation done today by industry and reviewed by myself Discussed with industry prior to her visit EKG pre revision and post revision >> noted post revision  QRS duration/morphology much different/linger. In review of pre revision and post revision device checks, pre revision was LV only post was Bive Battery and lead measurements are good No arrhythmias Programmed pace LV only, (LB are lead) and output adjusted for better safety margin CorVue is good (above threshold)  08/04/24: limited echo 1. Limited echo for pericardial effusion   2. Left ventricular ejection fraction, by estimation, is 25 to 30%. The  left ventricle has  severely decreased function. The left ventricle  demonstrates global hypokinesis.   3. The maximal measurement of the effusion is 0.62 cm. a small  pericardial effusion is present. The pericardial effusion is anterior to  the right ventricle. There is no evidence of cardiac tamponade.   4. The inferior vena cava is normal in size with greater than 50%  respiratory variability, suggesting right atrial pressure of 3 mmHg.    Risk Assessment/Calculations:    Physical Exam:   VS:  There were no vitals taken for this visit.   Wt Readings from Last 3 Encounters:  07/04/24 137 lb 3.2 oz (62.2 kg)  05/16/24 134 lb (60.8 kg)  04/11/24 137 lb 12.8 oz (62.5 kg)    GEN: Well nourished, well developed in no acute distress NECK: No JVD; No carotid bruits CARDIAC: RRR, no murmurs, rubs, gallops RESPIRATORY:  CTA b/l without rales, wheezing or rhonchi  ABDOMEN: Soft, non-tender, non-distended EXTREMITIES: No edema; No deformity   ICD site: is well healed, appears stable, no remaining stitch appresiated, no thinning, fluctuation, tethering  ASSESSMENT AND PLAN: .    CRT-D intact function programming changes made, see paceart  ICM Chronic CHF stable volume by exam CoreVue looks good >99 % BP EKG post programming change looks much better Over due to see Dr. Marciano  Programming change today > improved EKG, will defer to them timing of follow up Echo DOE I suspect is multifactorial, ICM, smoking, she appears volume stable Hypotensive/symptomatic I have stopped her spironolactone , given very los dose of her other meds already and she reports her prior anginal symptoms are very well controlled. Instructed her when/how to use her PRN lasix   Have asked that she see Dr. Tyrell APP at next available/ASAP o reassess symptoms, BP medications   CAD She reports her coronary/anginal symptoms are very well controlled, rare use of s/l NTG C/w Dr. Marciano   Dispo: remotes as usual, in  clinic w/EP again in 6mo, sooner if needed  Signed, Charlies Macario Arthur, PA-C   "

## 2024-11-13 ENCOUNTER — Ambulatory Visit: Attending: Physician Assistant | Admitting: Physician Assistant

## 2024-11-13 VITALS — BP 91/64 | Ht 62.0 in | Wt 136.0 lb

## 2024-11-13 DIAGNOSIS — I5022 Chronic systolic (congestive) heart failure: Secondary | ICD-10-CM | POA: Diagnosis present

## 2024-11-13 DIAGNOSIS — Z95810 Presence of automatic (implantable) cardiac defibrillator: Secondary | ICD-10-CM | POA: Diagnosis present

## 2024-11-13 DIAGNOSIS — I25118 Atherosclerotic heart disease of native coronary artery with other forms of angina pectoris: Secondary | ICD-10-CM | POA: Insufficient documentation

## 2024-11-13 DIAGNOSIS — I255 Ischemic cardiomyopathy: Secondary | ICD-10-CM | POA: Diagnosis present

## 2024-11-13 LAB — CUP PACEART INCLINIC DEVICE CHECK
Battery Remaining Longevity: 103 mo
Brady Statistic RA Percent Paced: 10 %
Brady Statistic RV Percent Paced: 99.81 %
Date Time Interrogation Session: 20260108114943
HighPow Impedance: 91.125
Implantable Lead Connection Status: 753985
Implantable Lead Connection Status: 753985
Implantable Lead Connection Status: 753985
Implantable Lead Implant Date: 20250711
Implantable Lead Implant Date: 20250711
Implantable Lead Implant Date: 20250828
Implantable Lead Location: 753858
Implantable Lead Location: 753859
Implantable Lead Location: 753860
Implantable Pulse Generator Implant Date: 20250711
Lead Channel Impedance Value: 462.5 Ohm
Lead Channel Impedance Value: 537.5 Ohm
Lead Channel Impedance Value: 650 Ohm
Lead Channel Pacing Threshold Amplitude: 0.625 V
Lead Channel Pacing Threshold Amplitude: 1 V
Lead Channel Pacing Threshold Amplitude: 1.125 V
Lead Channel Pacing Threshold Pulse Width: 0.3 ms
Lead Channel Pacing Threshold Pulse Width: 0.5 ms
Lead Channel Pacing Threshold Pulse Width: 0.5 ms
Lead Channel Sensing Intrinsic Amplitude: 12 mV
Lead Channel Sensing Intrinsic Amplitude: 2.8 mV
Lead Channel Setting Pacing Amplitude: 1.625
Lead Channel Setting Pacing Amplitude: 2 V
Lead Channel Setting Pacing Pulse Width: 0.3 ms
Lead Channel Setting Sensing Sensitivity: 0.5 mV
Pulse Gen Serial Number: 211050445
Zone Setting Status: 755011

## 2024-11-13 NOTE — Patient Instructions (Addendum)
 Medication Instructions:   STOP TAKING  SPIRONOLACTONE     *If you need a refill on your cardiac medications before your next appointment, please call your pharmacy*    Lab Work:       If you have labs (blood work) drawn today and your tests are completely normal, you will receive your results only by: MyChart Message (if you have MyChart) OR A paper copy in the mail If you have any lab test that is abnormal or we need to change your treatment, we will call you to review the results.    Testing/Procedures:    Follow-Up: At [facility], you and your health needs are our priority.  As part of our continuing mission to provide you with exceptional heart care, our providers are all part of one team.  This team includes your primary Cardiologist (physician) and Advanced Practice Providers or APPs (Physician Assistants and Nurse Practitioners) who all work together to provide you with the care you need, when you need it.  Your next appointment:   DR [facility] / APP NEXT AVAILABLE   AND  MEALOR / RENEE  IN 6 MONTHS     We recommend signing up for the patient portal called MyChart.  Sign up information is provided on this After Visit Summary.  MyChart is used to connect with patients for Virtual Visits (Telemedicine).  Patients are able to view lab/test results, encounter notes, upcoming appointments, etc.  Non-urgent messages can be sent to your provider as well.   To learn more about what you can do with MyChart, go to [email or web address].   Other Instructions

## 2024-11-14 ENCOUNTER — Other Ambulatory Visit (HOSPITAL_COMMUNITY): Payer: Self-pay | Admitting: Cardiology

## 2024-11-14 DIAGNOSIS — I2510 Atherosclerotic heart disease of native coronary artery without angina pectoris: Secondary | ICD-10-CM

## 2024-11-14 DIAGNOSIS — I5022 Chronic systolic (congestive) heart failure: Secondary | ICD-10-CM

## 2024-11-15 ENCOUNTER — Ambulatory Visit: Payer: Self-pay | Admitting: Cardiovascular Disease

## 2024-11-17 ENCOUNTER — Telehealth (HOSPITAL_COMMUNITY): Payer: Self-pay | Admitting: Cardiology

## 2024-11-17 NOTE — Telephone Encounter (Signed)
 Pt aware.

## 2024-11-17 NOTE — Telephone Encounter (Signed)
 Patient called to report SOB and pain in back  Does not check weights daily Denies swelling Reports SOB-increased even at rest   Last OV b/p 91/64 on 11/13/2024 Reports she has not missed any doses of medications Denies CP Denies fever Reports cough with phlegm  Concerned about pneumonia and or something going on with her heart   Reports she does have PCP however it takes forever to get an appt and no one ever answers the phone

## 2024-11-17 NOTE — Telephone Encounter (Signed)
 Her device check on 01/08 was reviewed. Her volume looked good by CorVue. No rhythm issues. Don't think shortness of breath is due to fluid retention but should be seen for follow-up. Hasn't been seen since 05/25.

## 2024-11-19 ENCOUNTER — Ambulatory Visit (HOSPITAL_COMMUNITY): Admitting: Internal Medicine

## 2024-11-25 ENCOUNTER — Inpatient Hospital Stay (HOSPITAL_COMMUNITY): Admission: EM | Admit: 2024-11-25 | Source: Ambulatory Visit | Attending: Student | Admitting: Student

## 2024-11-25 ENCOUNTER — Other Ambulatory Visit: Payer: Self-pay

## 2024-11-25 ENCOUNTER — Emergency Department (HOSPITAL_COMMUNITY)

## 2024-11-25 ENCOUNTER — Encounter (HOSPITAL_COMMUNITY): Payer: Self-pay

## 2024-11-25 DIAGNOSIS — Z720 Tobacco use: Secondary | ICD-10-CM

## 2024-11-25 DIAGNOSIS — J9601 Acute respiratory failure with hypoxia: Secondary | ICD-10-CM

## 2024-11-25 DIAGNOSIS — J441 Chronic obstructive pulmonary disease with (acute) exacerbation: Principal | ICD-10-CM

## 2024-11-25 DIAGNOSIS — J90 Pleural effusion, not elsewhere classified: Secondary | ICD-10-CM

## 2024-11-25 DIAGNOSIS — J181 Lobar pneumonia, unspecified organism: Secondary | ICD-10-CM

## 2024-11-25 DIAGNOSIS — I5022 Chronic systolic (congestive) heart failure: Secondary | ICD-10-CM

## 2024-11-25 DIAGNOSIS — J189 Pneumonia, unspecified organism: Secondary | ICD-10-CM | POA: Diagnosis present

## 2024-11-25 HISTORY — DX: Acute myocardial infarction, unspecified: I21.9

## 2024-11-25 LAB — COMPREHENSIVE METABOLIC PANEL WITH GFR
ALT: <5 U/L (ref 0–44)
AST: 13 U/L — ABNORMAL LOW (ref 15–41)
Albumin: 3.5 g/dL (ref 3.5–5.0)
Alkaline Phosphatase: 103 U/L (ref 38–126)
Anion gap: 17 — ABNORMAL HIGH (ref 5–15)
BUN: 11 mg/dL (ref 8–23)
CO2: 21 mmol/L — ABNORMAL LOW (ref 22–32)
Calcium: 8.9 mg/dL (ref 8.9–10.3)
Chloride: 94 mmol/L — ABNORMAL LOW (ref 98–111)
Creatinine, Ser: 1.01 mg/dL — ABNORMAL HIGH (ref 0.44–1.00)
GFR, Estimated: >60 mL/min
Glucose, Bld: 106 mg/dL — ABNORMAL HIGH (ref 70–99)
Potassium: 3.8 mmol/L (ref 3.5–5.1)
Sodium: 133 mmol/L — ABNORMAL LOW (ref 135–145)
Total Bilirubin: 0.9 mg/dL (ref 0.0–1.2)
Total Protein: 7.2 g/dL (ref 6.5–8.1)

## 2024-11-25 LAB — D-DIMER, QUANTITATIVE: D-Dimer, Quant: 3.79 ug{FEU}/mL — ABNORMAL HIGH (ref 0.00–0.50)

## 2024-11-25 LAB — CBC WITH DIFFERENTIAL/PLATELET
Basophils Absolute: 0 K/uL (ref 0.0–0.1)
Basophils Relative: 0 %
Eosinophils Absolute: 0.7 K/uL — ABNORMAL HIGH (ref 0.0–0.5)
Eosinophils Relative: 2 %
HCT: 44.6 % (ref 36.0–46.0)
Hemoglobin: 14.4 g/dL (ref 12.0–15.0)
Lymphocytes Relative: 6 %
Lymphs Abs: 2 K/uL (ref 0.7–4.0)
MCH: 27 pg (ref 26.0–34.0)
MCHC: 32.3 g/dL (ref 30.0–36.0)
MCV: 83.7 fL (ref 80.0–100.0)
Monocytes Absolute: 4.9 K/uL — ABNORMAL HIGH (ref 0.1–1.0)
Monocytes Relative: 15 %
Neutro Abs: 25.3 K/uL — ABNORMAL HIGH (ref 1.7–7.7)
Neutrophils Relative %: 77 %
Platelets: 344 K/uL (ref 150–400)
RBC: 5.33 MIL/uL — ABNORMAL HIGH (ref 3.87–5.11)
RDW: 14.6 % (ref 11.5–15.5)
WBC: 32.9 K/uL — ABNORMAL HIGH (ref 4.0–10.5)
nRBC: 0 % (ref 0.0–0.2)

## 2024-11-25 LAB — RESP PANEL BY RT-PCR (RSV, FLU A&B, COVID)  RVPGX2
Influenza A by PCR: NEGATIVE
Influenza B by PCR: NEGATIVE
Resp Syncytial Virus by PCR: NEGATIVE
SARS Coronavirus 2 by RT PCR: NEGATIVE

## 2024-11-25 LAB — ETHANOL: Alcohol, Ethyl (B): <15 mg/dL

## 2024-11-25 LAB — TROPONIN T, HIGH SENSITIVITY
Troponin T High Sensitivity: 8 ng/L (ref 0–19)
Troponin T High Sensitivity: 7 ng/L (ref 0–19)

## 2024-11-25 LAB — LACTIC ACID, PLASMA
Lactic Acid, Venous: 1.2 mmol/L (ref 0.5–1.9)
Lactic Acid, Venous: 1.9 mmol/L (ref 0.5–1.9)

## 2024-11-25 LAB — PROTIME-INR
INR: 1.2 (ref 0.8–1.2)
Prothrombin Time: 16.2 s — ABNORMAL HIGH (ref 11.4–15.2)

## 2024-11-25 LAB — TSH: TSH: 3.33 u[IU]/mL (ref 0.350–4.500)

## 2024-11-25 MED ORDER — AZITHROMYCIN 250 MG PO TABS
500.0000 mg | ORAL_TABLET | Freq: Once | ORAL | Status: AC
  Administered 2024-11-25: 500 mg via ORAL
  Filled 2024-11-25: qty 2

## 2024-11-25 MED ORDER — LACTATED RINGERS IV BOLUS (SEPSIS)
1000.0000 mL | Freq: Once | INTRAVENOUS | Status: AC
  Administered 2024-11-25: 1000 mL via INTRAVENOUS

## 2024-11-25 MED ORDER — SODIUM CHLORIDE 0.9 % IV SOLN: 500.0000 mg | Freq: Once | INTRAVENOUS | Status: DC

## 2024-11-25 MED ORDER — IPRATROPIUM-ALBUTEROL 0.5-2.5 (3) MG/3ML IN SOLN
3.0000 mL | Freq: Once | RESPIRATORY_TRACT | Status: AC
  Administered 2024-11-25: 3 mL via RESPIRATORY_TRACT
  Filled 2024-11-25: qty 3

## 2024-11-25 MED ORDER — IOHEXOL 350 MG/ML SOLN
75.0000 mL | Freq: Once | INTRAVENOUS | Status: AC | PRN
  Administered 2024-11-25: 75 mL via INTRAVENOUS

## 2024-11-25 MED ORDER — METHYLPREDNISOLONE SODIUM SUCC 125 MG IJ SOLR
125.0000 mg | Freq: Once | INTRAMUSCULAR | Status: AC
  Administered 2024-11-25: 125 mg via INTRAVENOUS
  Filled 2024-11-25: qty 2

## 2024-11-25 MED ORDER — SODIUM CHLORIDE 0.9 % IV SOLN
2.0000 g | Freq: Once | INTRAVENOUS | Status: AC
  Administered 2024-11-25: 2 g via INTRAVENOUS
  Filled 2024-11-25: qty 20

## 2024-11-25 NOTE — ED Triage Notes (Signed)
 Pt arrived via POV from [facility] for further evaluation of feeling SOB X 1 week. Per paperwork from Urgent Care, Pts O2 Sats were 82%-88% on room air and BP was hypotensive.

## 2024-11-25 NOTE — ED Triage Notes (Signed)
 Pt also reports concern about a bruise behind her left knee.

## 2024-11-25 NOTE — Consult Note (Signed)
 CODE SEPSIS - PHARMACY COMMUNICATION  **Broad Spectrum Antibiotics should be administered within 1 hour of Sepsis diagnosis**  Time Code Sepsis Called/Page Received: 8144  Antibiotics Ordered: azithromycin  and rocephin   Time of 1st antibiotic administration: 1914  Additional action taken by pharmacy: none  If necessary, Name of Provider/Nurse Contacted: n/a    Annabella LOISE Banks ,PharmD Clinical Pharmacist  11/25/2024  7:01 PM

## 2024-11-25 NOTE — Sepsis Progress Note (Signed)
 Elink monitoring for the code sepsis protocol.

## 2024-11-25 NOTE — ED Provider Notes (Signed)
 " [place] [facility] AT [facility] Provider Note   CSN: [id number] Arrival date & time: 11/25/24  8197     Patient presents with: Shortness of Breath   Whitney May is a 64 y.o. female.   Pt is a 64 yo female with pmhx significant for hypothyroidism, hld, tobacco and alcohol abuse, CAD, and ischemic CM s/p AICD placement.  Pt has been SOB for a week.  She did see cardiology on 1/8 and noted she was SOB then.  Her pacemaker was checked and was working well.  Her bp was on the low side, so her spironolactone  was stopped and lasix  was made prn.  Pt said her breathing has worsened and she is coughing up sputum.  She went to UC and O2 sats were 82-88% on RA.  BP was also low.  Pt was sent here via POV. Pt put on 4L via [place] and O2 sats up to the low 90s.       Prior to Admission medications  Medication Sig Start Date End Date Taking? Authorizing Provider  acetaminophen  (TYLENOL ) 500 MG tablet Take 1,000 mg by mouth every 6 (six) hours as needed for moderate pain (pain score 4-6).    [provider]  aspirin  81 MG chewable tablet Chew 1 tablet (81 mg total) by mouth daily. 06/08/23   Glena Harlene HERO, FNP  bisoprolol  (ZEBETA ) 5 MG tablet TAKE 1/2 TABLET BY MOUTH ONCE DAILY 10/16/24   McLean, Dalton S, MD  calcium  carbonate (TUMS - DOSED IN MG ELEMENTAL CALCIUM ) 500 MG chewable tablet Chew 1 tablet by mouth daily as needed for indigestion or heartburn.    [provider]  empagliflozin  (JARDIANCE ) 10 MG TABS tablet TAKE 1 TABLET BY MOUTH DAILY BEFORE BREAKFAST 11/14/24   Rolan Ezra RAMAN, MD  furosemide  (LASIX ) 20 MG tablet Take 1 tablet (20 mg total) by mouth daily as needed for weight gain of 3 lbs in 24 hours or 5 lbs in a week 04/02/24 11/13/24  Lee, Jordan, NP  isosorbide  mononitrate (IMDUR ) 30 MG 24 hr tablet TAKE 1 TABLET BY MOUTH DAILY 10/16/24   McLean, Dalton S, MD  levothyroxine  (SYNTHROID ) 100 MCG tablet Take 1 tablet (100 mcg total) by mouth daily  at 6 (six) AM. 04/18/23 11/13/24  Christobal Guadalajara, MD  losartan  (COZAAR ) 25 MG tablet TAKE 1/2 TABLET BY MOUTH DAILY 09/04/24   McLean, Dalton S, MD  nitroGLYCERIN  (NITROSTAT ) 0.4 MG SL tablet Place 1 tablet (0.4 mg total) under the tongue every 5 (five) minutes x 3 doses as needed for chest pain. 07/05/23   Rolan Ezra RAMAN, MD  potassium chloride  SA (KLOR-CON  M20) 20 MEQ tablet Take one tablet (20 mEq total) by mouth Thursday and Friday morning 05/14/24   Mealor, Augustus E, MD  ranolazine  (RANEXA ) 500 MG 12 hr tablet Take 1 tablet (500 mg total) by mouth 2 (two) times daily. PLEASE SCHEDULE APPOINTMENT FOR MORE REFILLS [phone] OPTION 2 11/14/24   Rolan Ezra RAMAN, MD  rosuvastatin  (CRESTOR ) 40 MG tablet Take 1 tablet (40 mg total) by mouth daily. PLEASE SCHEDULE APPOINTMENT FOR MORE REFILLS [phone] OPTION 2 10/27/24   Rolan Ezra RAMAN, MD  ticagrelor  (BRILINTA ) 90 MG TABS tablet Take 1 tablet (90 mg total) by mouth 2 (two) times daily. PLEASE SCHEDULE APPOINTMENT FOR MORE REFILLS [phone] OPTION 2 09/08/24   Rolan Ezra RAMAN, MD    Allergies: Penicillins    Review of Systems  Respiratory:  Positive for cough, shortness of  breath and wheezing.   All other systems reviewed and are negative.   Updated Vital Signs BP (!) 122/90   Pulse 89   Temp 98.4 F (36.9 C) (Oral)   Resp 19   Ht 5' 2 (1.575 m)   Wt 61.7 kg   SpO2 93%   BMI 24.87 kg/m   Physical Exam Vitals and nursing note reviewed.  Constitutional:      General: She is in acute distress.     Appearance: She is well-developed. She is ill-appearing.  HENT:     Head: Normocephalic and atraumatic.     Mouth/Throat:     Mouth: Mucous membranes are moist.     Pharynx: Oropharynx is clear.  Eyes:     Extraocular Movements: Extraocular movements intact.     Pupils: Pupils are equal, round, and reactive to light.  Pulmonary:     Effort: Tachypnea present.     Breath sounds: Wheezing and rhonchi present.  Abdominal:      General: Bowel sounds are normal.     Palpations: Abdomen is soft.  Musculoskeletal:        General: Normal range of motion.     Cervical back: Normal range of motion and neck supple.  Skin:    General: Skin is warm.     Capillary Refill: Capillary refill takes less than 2 seconds.  Neurological:     General: No focal deficit present.     Mental Status: She is oriented to person, place, and time.  Psychiatric:        Mood and Affect: Mood normal.        Behavior: Behavior normal.     (all labs ordered are listed, but only abnormal results are displayed) Labs Reviewed  CBC WITH DIFFERENTIAL/PLATELET - Abnormal; Notable for the following components:      Result Value   WBC 32.9 (*)    RBC 5.33 (*)    Neutro Abs 25.3 (*)    Monocytes Absolute 4.9 (*)    Eosinophils Absolute 0.7 (*)    All other components within normal limits  COMPREHENSIVE METABOLIC PANEL WITH GFR - Abnormal; Notable for the following components:   Sodium 133 (*)    Chloride 94 (*)    CO2 21 (*)    Glucose, Bld 106 (*)    Creatinine, Ser 1.01 (*)    AST 13 (*)    Anion gap 17 (*)    All other components within normal limits  D-DIMER, QUANTITATIVE - Abnormal; Notable for the following components:   D-Dimer, Quant 3.79 (*)    All other components within normal limits  PROTIME-INR - Abnormal; Notable for the following components:   Prothrombin Time 16.2 (*)    All other components within normal limits  CULTURE, BLOOD (ROUTINE X 2)  CULTURE, BLOOD (ROUTINE X 2)  RESP PANEL BY RT-PCR (RSV, FLU A&B, COVID)  RVPGX2  LACTIC ACID, PLASMA  LACTIC ACID, PLASMA  TSH  ETHANOL  TROPONIN T, HIGH SENSITIVITY  TROPONIN T, HIGH SENSITIVITY    EKG: EKG Interpretation Date/Time:  Tuesday November 25 2024 18:36:42 EST Ventricular Rate:  90 PR Interval:  168 QRS Duration:  118 QT Interval:  406 QTC Calculation: 496 R Axis:   -78  Text Interpretation: Atrial-sensed ventricular-paced rhythm Abnormal ECG When  compared with ECG of 13-Nov-2024 10:45, Vent. rate has increased BY  18 BPM Confirmed by Dean Clarity [phone]) on 11/25/2024 7:04:21 PM  Radiology: CT Angio Chest PE W and/or Wo Contrast  Result Date: 11/25/2024 CLINICAL DATA:  Shortness of breath EXAM: CT ANGIOGRAPHY CHEST WITH CONTRAST TECHNIQUE: Multidetector CT imaging of the chest was performed using the standard protocol during bolus administration of intravenous contrast. Multiplanar CT image reconstructions and MIPs were obtained to evaluate the vascular anatomy. RADIATION DOSE REDUCTION: This exam was performed according to the departmental dose-optimization program which includes automated exposure control, adjustment of the mA and/or kV according to patient size and/or use of iterative reconstruction technique. CONTRAST:  75mL OMNIPAQUE  IOHEXOL  350 MG/ML SOLN COMPARISON:  Chest x-ray 11/25/2024, chest CT 11/29/2023 FINDINGS: Cardiovascular: Satisfactory opacification of the pulmonary arteries to the segmental level. No evidence of pulmonary embolism. Mild aortic atherosclerosis. Ectatic ascending aorta up to 3.6 cm. Coronary vascular calcification. Cardiomegaly. No pericardial effusion. Left-sided pacing device with multiple intracardiac leads. Mediastinum/Nodes: Patent trachea. No thyroid  mass. Enlarged mediastinal lymph nodes. Superior right paratracheal node measuring 12 mm. AP window lymph nodes measuring up to 10 mm. Precarinal nodes measuring up to 10 mm. Subcarinal nodes measuring up to 16 mm. Right hilar nodes measuring up to 12 mm. Esophagus unremarkable Lungs/Pleura: Moderate slightly loculated appearing right pleural effusion. Small amount of loculated fluid along the right major fissure. Mild emphysema. 3 mm left apical lung nodule on series 12, image 27. This is unchanged. Partial consolidation in the right lower lobe Upper Abdomen: No acute finding Musculoskeletal: No acute osseous abnormality Review of the MIP images confirms the above  findings. IMPRESSION: 1. Negative for acute pulmonary embolus. 2. Moderate slightly loculated appearing right pleural effusion. Partial consolidation in the right lower lobe, atelectasis versus pneumonia. 3. Cardiomegaly. 4. Mild mediastinal and right hilar adenopathy, nonspecific, but potentially reactive. Attention on chest CT follow-up imaging. 5. Emphysema. Pulmonary emphysema is an independent risk factor for lung cancer. The patient is already enrolled in lung cancer chest CT screening program Aortic Atherosclerosis (ICD10-I70.0) and Emphysema (ICD10-J43.9). Electronically Signed   By: Luke Bun M.D.   On: 11/25/2024 21:47   DG Chest Port 1 View Result Date: 11/25/2024 EXAM: 2 Views XRAY of the Chest 11/25/2024 06:54:17 PM COMPARISON: None available. CLINICAL HISTORY: FINDINGS: LINES, TUBES AND DEVICES: Left triple lead cardiac device in place. LUNGS AND PLEURA: Moderate loculated right pleural effusion. There is patchy atelectasis/airspace disease in the right lower lobe. HEART AND MEDIASTINUM: No acute abnormality of the cardiac and mediastinal silhouettes. BONES AND SOFT TISSUES: No acute osseous abnormality. IMPRESSION: 1. Moderate loculated right pleural effusion. 2. Patchy right lower lobe atelectasis or airspace disease. Electronically signed by: Greig Pique MD 11/25/2024 07:02 PM EST RP Workstation: [id number]     Procedures   Medications Ordered in the ED  lactated ringers  bolus 1,000 mL (0 mLs Intravenous Stopped 11/25/24 1953)    And  lactated ringers  bolus 1,000 mL (1,000 mLs Intravenous New Bag/Given 11/25/24 2200)  ipratropium-albuterol  (DUONEB) 0.5-2.5 (3) MG/3ML nebulizer solution 3 mL (3 mLs Nebulization Given 11/25/24 2005)  methylPREDNISolone  sodium succinate (SOLU-MEDROL ) 125 mg/2 mL injection 125 mg (125 mg Intravenous Given 11/25/24 1914)  cefTRIAXone  (ROCEPHIN ) 2 g in sodium chloride  0.9 % 100 mL IVPB (0 g Intravenous Stopped 11/25/24 1944)  azithromycin  (ZITHROMAX )  tablet 500 mg (500 mg Oral Given 11/25/24 1914)  iohexol  (OMNIPAQUE ) 350 MG/ML injection 75 mL (75 mLs Intravenous Contrast Given 11/25/24 2102)                                    Medical Decision Making  Amount and/or Complexity of Data Reviewed Labs: ordered. Radiology: ordered.  Risk Prescription drug management. Decision regarding hospitalization.   This patient presents to the ED for concern of sob, this involves an extensive number of treatment options, and is a complaint that carries with it a high risk of complications and morbidity.  The differential diagnosis includes pna, covid/flu/rsv, chf, bronchitis, COPD   Co morbidities that complicate the patient evaluation  hypothyroidism, hld, tobacco and alcohol abuse, CAD, and ischemic CM s/p AICD placement   Additional history obtained:  Additional history obtained from epic chart review External records from outside source obtained and reviewed including family   Lab Tests:  I Ordered, and personally interpreted labs.  The pertinent results include:  cmp with cr sl elevated at 1.01; lactic nl; ddimer elevated at 3.79, etoh neg; trop neg   Imaging Studies ordered:  I ordered imaging studies including cxr and ct chest I independently visualized and interpreted imaging which showed  CXR: 1. Moderate loculated right pleural effusion.  2. Patchy right lower lobe atelectasis or airspace disease.  CT chest:  Negative for acute pulmonary embolus.  2. Moderate slightly loculated appearing right pleural effusion.  Partial consolidation in the right lower lobe, atelectasis versus  pneumonia.  3. Cardiomegaly.  4. Mild mediastinal and right hilar adenopathy, nonspecific, but  potentially reactive. Attention on chest CT follow-up imaging.  5. Emphysema. Pulmonary emphysema is an independent risk factor for  lung cancer. The patient is already enrolled in lung cancer chest CT  screening program    Aortic Atherosclerosis  (ICD10-I70.0) and Emphysema (ICD10-J43.9).   I agree with the radiologist interpretation   Cardiac Monitoring:  The patient was maintained on a cardiac monitor.  I personally viewed and interpreted the cardiac monitored which showed an underlying rhythm of: paced rhythm   Medicines ordered and prescription drug management:  I ordered medication including rocephin /zithromax , ivfs  for sepsis  Reevaluation of the patient after these medicines showed that the patient improved I have reviewed the patients home medicines and have made adjustments as needed   Test Considered:  ct   Critical Interventions:  Oxygen , abx   Consultations Obtained:  I requested consultation with the hospitalist (Dr. Shona),  and discussed lab and imaging findings as well as pertinent plan -she will admit   Problem List / ED Course:  COPD exacerbation with RLL pna, pleural effusion and hypoxia:  Code sepsis called:  pt given sepsis fluids and bp has improved to normal.   Pt is breathing easier after nebs and steroids.  Pt given iv rocephin  and oral zithromax .  She continues on 4L oxygen .  She is not normally on oxygen .  She is encouraged to stop smoking.     Reevaluation:  After the interventions noted above, I reevaluated the patient and found that they have :improved   Social Determinants of Health:  Lives at home   Dispostion:  After consideration of the diagnostic results and the patients response to treatment, I feel that the patent would benefit from admission.  CRITICAL CARE Performed by: Mliss Boyers   Total critical care time: 45 minutes  Critical care time was exclusive of separately billable procedures and treating other patients.  Critical care was necessary to treat or prevent imminent or life-threatening deterioration.  Critical care was time spent personally by me on the following activities: development of treatment plan with patient and/or surrogate as well as  nursing, discussions with consultants, evaluation of patient's response to treatment, examination  of patient, obtaining history from patient or surrogate, ordering and performing treatments and interventions, ordering and review of laboratory studies, ordering and review of radiographic studies, pulse oximetry and re-evaluation of patient's condition.        Final diagnoses:  COPD exacerbation (HCC)  Community acquired pneumonia of right lower lobe of lung  Pleural effusion on right  Tobacco abuse  Acute respiratory failure with hypoxia [facility])    ED Discharge Orders     None          Dean Clarity, MD 11/25/24 2213  "

## 2024-11-25 NOTE — ED Notes (Signed)
 Pt also reports she has a ICD in her left chest.

## 2024-11-26 ENCOUNTER — Inpatient Hospital Stay (HOSPITAL_COMMUNITY)

## 2024-11-26 ENCOUNTER — Encounter (HOSPITAL_COMMUNITY): Payer: Self-pay | Admitting: Internal Medicine

## 2024-11-26 DIAGNOSIS — J189 Pneumonia, unspecified organism: Secondary | ICD-10-CM | POA: Diagnosis not present

## 2024-11-26 DIAGNOSIS — I5022 Chronic systolic (congestive) heart failure: Secondary | ICD-10-CM

## 2024-11-26 DIAGNOSIS — J9601 Acute respiratory failure with hypoxia: Secondary | ICD-10-CM

## 2024-11-26 DIAGNOSIS — J90 Pleural effusion, not elsewhere classified: Secondary | ICD-10-CM

## 2024-11-26 DIAGNOSIS — Z720 Tobacco use: Secondary | ICD-10-CM | POA: Diagnosis not present

## 2024-11-26 DIAGNOSIS — J181 Lobar pneumonia, unspecified organism: Secondary | ICD-10-CM

## 2024-11-26 LAB — CBC
HCT: 40.2 % (ref 36.0–46.0)
Hemoglobin: 12.7 g/dL (ref 12.0–15.0)
MCH: 26.6 pg (ref 26.0–34.0)
MCHC: 31.6 g/dL (ref 30.0–36.0)
MCV: 84.3 fL (ref 80.0–100.0)
Platelets: 307 K/uL (ref 150–400)
RBC: 4.77 MIL/uL (ref 3.87–5.11)
RDW: 14.7 % (ref 11.5–15.5)
WBC: 29.1 K/uL — ABNORMAL HIGH (ref 4.0–10.5)
nRBC: 0 % (ref 0.0–0.2)

## 2024-11-26 LAB — BASIC METABOLIC PANEL WITH GFR
Anion gap: 12 (ref 5–15)
BUN: 15 mg/dL (ref 8–23)
CO2: 26 mmol/L (ref 22–32)
Calcium: 8.7 mg/dL — ABNORMAL LOW (ref 8.9–10.3)
Chloride: 98 mmol/L (ref 98–111)
Creatinine, Ser: 1.09 mg/dL — ABNORMAL HIGH (ref 0.44–1.00)
GFR, Estimated: 58 mL/min — ABNORMAL LOW
Glucose, Bld: 149 mg/dL — ABNORMAL HIGH (ref 70–99)
Potassium: 3.9 mmol/L (ref 3.5–5.1)
Sodium: 136 mmol/L (ref 135–145)

## 2024-11-26 LAB — GRAM STAIN

## 2024-11-26 LAB — BODY FLUID CELL COUNT WITH DIFFERENTIAL
Eos, Fluid: 54 %
Lymphs, Fluid: 2 %
Monocyte-Macrophage-Serous Fluid: 9 % — ABNORMAL LOW (ref 50–90)
Neutrophil Count, Fluid: 35 % — ABNORMAL HIGH (ref 0–25)
Total Nucleated Cell Count, Fluid: 2025 uL — ABNORMAL HIGH (ref 0–1000)

## 2024-11-26 LAB — PHOSPHORUS: Phosphorus: 4.2 mg/dL (ref 2.5–4.6)

## 2024-11-26 LAB — LACTATE DEHYDROGENASE: LDH: 322 U/L — ABNORMAL HIGH (ref 105–235)

## 2024-11-26 LAB — MAGNESIUM: Magnesium: 2.2 mg/dL (ref 1.7–2.4)

## 2024-11-26 MED ORDER — LIDOCAINE HCL (PF) 2 % IJ SOLN
INTRAMUSCULAR | Status: AC
  Filled 2024-11-26: qty 10

## 2024-11-26 MED ORDER — IPRATROPIUM-ALBUTEROL 0.5-2.5 (3) MG/3ML IN SOLN
3.0000 mL | Freq: Four times a day (QID) | RESPIRATORY_TRACT | Status: DC
  Administered 2024-11-26 – 2024-11-27 (×7): 3 mL via RESPIRATORY_TRACT
  Filled 2024-11-26 (×7): qty 3

## 2024-11-26 MED ORDER — LIDOCAINE HCL (PF) 2 % IJ SOLN
10.0000 mL | Freq: Once | INTRAMUSCULAR | Status: AC
  Administered 2024-11-26: 10 mL via INTRADERMAL

## 2024-11-26 MED ORDER — AZITHROMYCIN 250 MG PO TABS: 500.0000 mg | ORAL_TABLET | Freq: Once | ORAL | Status: DC

## 2024-11-26 MED ORDER — RANOLAZINE ER 500 MG PO TB12
500.0000 mg | ORAL_TABLET | Freq: Two times a day (BID) | ORAL | Status: AC
  Administered 2024-11-26 – 2024-12-12 (×35): 500 mg via ORAL
  Filled 2024-11-26 (×35): qty 1

## 2024-11-26 MED ORDER — MELATONIN 3 MG PO TABS
6.0000 mg | ORAL_TABLET | Freq: Every evening | ORAL | Status: AC | PRN
  Administered 2024-11-27 – 2024-12-03 (×2): 6 mg via ORAL
  Filled 2024-11-26 (×2): qty 2

## 2024-11-26 MED ORDER — MIDODRINE HCL 5 MG PO TABS
5.0000 mg | ORAL_TABLET | Freq: Three times a day (TID) | ORAL | Status: AC
  Administered 2024-11-26 – 2024-12-12 (×50): 5 mg via ORAL
  Filled 2024-11-26 (×49): qty 1

## 2024-11-26 MED ORDER — ACETAMINOPHEN 325 MG PO TABS
650.0000 mg | ORAL_TABLET | Freq: Four times a day (QID) | ORAL | Status: AC | PRN
  Administered 2024-11-27 – 2024-12-04 (×4): 650 mg via ORAL
  Filled 2024-11-26 (×4): qty 2

## 2024-11-26 MED ORDER — POLYETHYLENE GLYCOL 3350 17 G PO PACK
17.0000 g | PACK | Freq: Every day | ORAL | Status: AC | PRN
  Administered 2024-12-07: 17 g via ORAL
  Filled 2024-11-26: qty 1

## 2024-11-26 MED ORDER — LACTATED RINGERS IV SOLN: INTRAVENOUS | Status: AC

## 2024-11-26 MED ORDER — ENOXAPARIN SODIUM 40 MG/0.4ML IJ SOSY
40.0000 mg | PREFILLED_SYRINGE | INTRAMUSCULAR | Status: DC
  Administered 2024-11-26 – 2024-12-01 (×6): 40 mg via SUBCUTANEOUS
  Filled 2024-11-26 (×6): qty 0.4

## 2024-11-26 MED ORDER — ASPIRIN 81 MG PO CHEW
81.0000 mg | CHEWABLE_TABLET | Freq: Every day | ORAL | Status: DC
  Administered 2024-11-26 – 2024-11-29 (×4): 81 mg via ORAL
  Filled 2024-11-26 (×4): qty 1

## 2024-11-26 MED ORDER — SODIUM CHLORIDE 0.9 % IV SOLN
2.0000 g | INTRAVENOUS | Status: DC
  Administered 2024-11-26 – 2024-12-11 (×16): 2 g via INTRAVENOUS
  Filled 2024-11-26 (×17): qty 20

## 2024-11-26 MED ORDER — SODIUM CHLORIDE 0.9 % IV SOLN: 2.0000 g | Freq: Once | INTRAVENOUS | Status: DC

## 2024-11-26 MED ORDER — LEVOTHYROXINE SODIUM 100 MCG PO TABS
100.0000 ug | ORAL_TABLET | Freq: Every day | ORAL | Status: AC
  Administered 2024-11-26 – 2024-12-12 (×17): 100 ug via ORAL
  Filled 2024-11-26 (×17): qty 1

## 2024-11-26 MED ORDER — AZITHROMYCIN 250 MG PO TABS
500.0000 mg | ORAL_TABLET | Freq: Every day | ORAL | Status: DC
  Administered 2024-11-26 – 2024-11-27 (×2): 500 mg via ORAL
  Filled 2024-11-26 (×2): qty 2

## 2024-11-26 MED ORDER — ROSUVASTATIN CALCIUM 20 MG PO TABS
40.0000 mg | ORAL_TABLET | Freq: Every day | ORAL | Status: AC
  Administered 2024-11-26 – 2024-12-12 (×18): 40 mg via ORAL
  Filled 2024-11-26 (×18): qty 2

## 2024-11-26 MED ORDER — TICAGRELOR 90 MG PO TABS
90.0000 mg | ORAL_TABLET | Freq: Two times a day (BID) | ORAL | Status: DC
  Administered 2024-11-26 – 2024-11-29 (×8): 90 mg via ORAL
  Filled 2024-11-26 (×8): qty 1

## 2024-11-26 MED ORDER — NICOTINE 14 MG/24HR TD PT24
14.0000 mg | MEDICATED_PATCH | Freq: Every day | TRANSDERMAL | Status: DC
  Administered 2024-11-26 – 2024-12-03 (×8): 14 mg via TRANSDERMAL
  Filled 2024-11-26 (×8): qty 1

## 2024-11-26 MED ORDER — PROCHLORPERAZINE EDISYLATE 10 MG/2ML IJ SOLN: 5.0000 mg | Freq: Four times a day (QID) | INTRAMUSCULAR | Status: AC | PRN

## 2024-11-26 NOTE — Discharge Instructions (Signed)
 Food Agency Name: [facility] Address: [address] [place], [place] [address] Phone: [phone] Website: www.adtsrc.org Services Offered: Meals on [employer] and Meals with Friends.   Home care, at home assisted living, volunteer services, [facility]  [facility] Ret[facility], transportation  Agency Name: [facility] Address: Sites vary. Must call first. Food Pantry location: [address], [place], [place] [address] [employer] Phone: [phone] Website: none Services Offered: Museum/gallery curator, utility assistance if funds available Careers adviser for all of [place], [employer], [employer], Office manager and [employer] for [employer] area only) Walk-in  current Id and current address verification required.  Wed-Thurs: 9:30-12:00 Agency Name: [facility] Address: [address], [place], [place] [address]  Phone: [phone] or [phone] Website: none Services Offered: Food assistance Agency Name: [employer] Address: [address] [place], [place] [address]  Phone: [phone] or [phone]  Website: none Services Offered: Serves 1 hot meal a day at 11:00 am Monday-Sunday and 5 pm  on the second and fourth Sunday of each month Agency Name: [facility] and [facility]  Services/Social Services  Address: [phone], [place], [place] [address] Phone: [phone] Website: www.co.[place].us  [email or web address] Services Offered: Sales executive, [facility] program Agency Name: [facility] Address: [address] [place], [place] [address] Phone: [phone] Website: www.[place].com Services Offered: Food pantry Tuesday, Wednesday and Thursday 9am-11:30am  (need appointment) and health clinic (9:00am-11:00am) Agency Name: [employer] [facility] Address: [address], [place] / [address], [place] Phone: [phone] [place] / [phone] [place] Website: OpinionTrades.tn NetworkAffair.co.za Services Offered: Civil Service fast streamer, food pantry, soup kitchen ([place]) emergency financial  assistance, thrift stores, showers & hygiene products ([place]),  Christmas Assistance, spiritual help

## 2024-11-26 NOTE — Progress Notes (Signed)
 Pt had a right ultrasound guided thoracentesis performed. Removed 90cc of amber colored fluid. Tolerated well. VSS. Fluid sent to lab by US  tech.  Pt taken to xray for chest xray and taken back upstairs in no acute distress.

## 2024-11-26 NOTE — Progress Notes (Signed)
" °   11/26/24 1058  TOC Brief Assessment  Insurance and Status Reviewed  Patient has primary care physician Yes  Home environment has been reviewed From Home  Prior level of function: Independent  Prior/Current Home Services No current home services  Social Drivers of Health Review SDOH reviewed interventions complete  Readmission risk has been reviewed Yes  Transition of care needs no transition of care needs at this time   Inpatient Care Management (ICM) has reviewed patient and no other ICM needs have been identified at this time. We will continue to monitor patient advancement through interdisciplinary progression rounds. If new patient transition needs arise, please place a ICM consult. "

## 2024-11-26 NOTE — H&P (Incomplete)
 " History and Physical  Whitney May FMW:[id number] DOB: 1961/03/27 DOA: 11/25/2024  Referring physician: Dr. Dean, EDP  PCP: Alston Silvio BROCKS, FNP  Outpatient Specialists: Cardiology, pulmonary. Patient coming from: Home.  Chief Complaint: Shortness of breath and productive cough.  HPI: Whitney May is a 64 y.o. female with medical history significant for         ED Course: ***  Review of Systems: Review of systems as noted in the HPI. All other systems reviewed and are negative.   Past Medical History:  Diagnosis Date   Alcohol use    CAD (coronary artery disease)    Chest pain    Hyperlipidemia, mixed    Hypotension    Hypothyroidism    Ischemic cardiomyopathy    LBBB (left bundle branch block)    MI (myocardial infarction) (HCC)    Tobacco abuse    Past Surgical History:  Procedure Laterality Date   BIV ICD INSERTION CRT-D N/A 05/16/2024   Procedure: BIV ICD INSERTION CRT-D;  Surgeon: Nancey Eulas BRAVO, MD;  Location: [facility] INVASIVE CV LAB;  Service: Cardiovascular;  Laterality: N/A;   CESAREAN SECTION     CORONARY STENT INTERVENTION N/A 03/07/2019   Procedure: CORONARY STENT INTERVENTION;  Surgeon: Wonda Sharper, MD;  Location: [facility] INVASIVE CV LAB;  Service: Cardiovascular;  Laterality: N/A;   CORONARY/GRAFT ACUTE MI REVASCULARIZATION N/A 08/02/2018   Procedure: Coronary/Graft Acute MI Revascularization;  Surgeon: Anner Alm ORN, MD;  Location: [facility] INVASIVE CV LAB;  Service: Cardiovascular;  Laterality: N/A;   CORONARY/GRAFT ACUTE MI REVASCULARIZATION N/A 09/18/2022   Procedure: Coronary/Graft Acute MI Revascularization;  Surgeon: Court Dorn PARAS, MD;  Location: [facility] INVASIVE CV LAB;  Service: Cardiovascular;  Laterality: N/A;   ICD IMPLANT Left    LEAD REVISION/REPAIR N/A 07/03/2024   Procedure: LEAD REVISION/REPAIR;  Surgeon: Inocencio Soyla Lunger, MD;  Location: [facility] INVASIVE CV LAB;  Service: Cardiovascular;  Laterality: N/A;   LEFT HEART  CATH AND CORONARY ANGIOGRAPHY N/A 08/02/2018   Procedure: LEFT HEART CATH AND CORONARY ANGIOGRAPHY;  Surgeon: Anner Alm ORN, MD;  Location: [facility] INVASIVE CV LAB;  Service: Cardiovascular;  Laterality: N/A;   LEFT HEART CATH AND CORONARY ANGIOGRAPHY N/A 03/07/2019   Procedure: LEFT HEART CATH AND CORONARY ANGIOGRAPHY;  Surgeon: Cherrie Toribio SAUNDERS, MD;  Location: [facility] INVASIVE CV LAB;  Service: Cardiovascular;  Laterality: N/A;   LEFT HEART CATH AND CORONARY ANGIOGRAPHY N/A 09/18/2022   Procedure: LEFT HEART CATH AND CORONARY ANGIOGRAPHY;  Surgeon: Court Dorn PARAS, MD;  Location: [facility] INVASIVE CV LAB;  Service: Cardiovascular;  Laterality: N/A;   LEFT HEART CATH AND CORONARY ANGIOGRAPHY N/A 04/16/2023   Procedure: LEFT HEART CATH AND CORONARY ANGIOGRAPHY;  Surgeon: Anner Alm ORN, MD;  Location: [facility] INVASIVE CV LAB;  Service: Cardiovascular;  Laterality: N/A;    Social History:  reports that she has been smoking cigarettes. She started smoking about 28 years ago. She has a 22 pack-year smoking history. She has never used smokeless tobacco. She reports current alcohol use of about 8.0 standard drinks of alcohol per week. She reports that she does not use drugs.   Allergies[1]  Family History  Problem Relation Age of Onset   Coronary artery disease Mother    Coronary artery disease Maternal Grandmother    Hypertension Father    Diabetes Father     ***  Prior to Admission medications  Medication Sig Start Date End Date Taking? Authorizing Provider  acetaminophen  (TYLENOL ) 500 MG tablet Take 1,000 mg by  mouth every 6 (six) hours as needed for moderate pain (pain score 4-6).    [provider]  aspirin  81 MG chewable tablet Chew 1 tablet (81 mg total) by mouth daily. 06/08/23   Glena Harlene HERO, FNP  bisoprolol  (ZEBETA ) 5 MG tablet TAKE 1/2 TABLET BY MOUTH ONCE DAILY 10/16/24   McLean, Dalton S, MD  calcium  carbonate (TUMS - DOSED IN MG ELEMENTAL CALCIUM ) 500 MG chewable tablet  Chew 1 tablet by mouth daily as needed for indigestion or heartburn.    [provider]  empagliflozin  (JARDIANCE ) 10 MG TABS tablet TAKE 1 TABLET BY MOUTH DAILY BEFORE BREAKFAST 11/14/24   Rolan Ezra RAMAN, MD  furosemide  (LASIX ) 20 MG tablet Take 1 tablet (20 mg total) by mouth daily as needed for weight gain of 3 lbs in 24 hours or 5 lbs in a week 04/02/24 11/13/24  Lee, Jordan, NP  isosorbide  mononitrate (IMDUR ) 30 MG 24 hr tablet TAKE 1 TABLET BY MOUTH DAILY 10/16/24   McLean, Dalton S, MD  levothyroxine  (SYNTHROID ) 100 MCG tablet Take 1 tablet (100 mcg total) by mouth daily at 6 (six) AM. 04/18/23 11/13/24  Christobal Guadalajara, MD  losartan  (COZAAR ) 25 MG tablet TAKE 1/2 TABLET BY MOUTH DAILY 09/04/24   McLean, Dalton S, MD  nitroGLYCERIN  (NITROSTAT ) 0.4 MG SL tablet Place 1 tablet (0.4 mg total) under the tongue every 5 (five) minutes x 3 doses as needed for chest pain. 07/05/23   Rolan Ezra RAMAN, MD  potassium chloride  SA (KLOR-CON  M20) 20 MEQ tablet Take one tablet (20 mEq total) by mouth Thursday and Friday morning 05/14/24   Mealor, Augustus E, MD  ranolazine  (RANEXA ) 500 MG 12 hr tablet Take 1 tablet (500 mg total) by mouth 2 (two) times daily. PLEASE SCHEDULE APPOINTMENT FOR MORE REFILLS [phone] OPTION 2 11/14/24   Rolan Ezra RAMAN, MD  rosuvastatin  (CRESTOR ) 40 MG tablet Take 1 tablet (40 mg total) by mouth daily. PLEASE SCHEDULE APPOINTMENT FOR MORE REFILLS [phone] OPTION 2 10/27/24   Rolan Ezra RAMAN, MD  ticagrelor  (BRILINTA ) 90 MG TABS tablet Take 1 tablet (90 mg total) by mouth 2 (two) times daily. PLEASE SCHEDULE APPOINTMENT FOR MORE REFILLS [phone] OPTION 2 09/08/24   Rolan Ezra RAMAN, MD    Physical Exam: BP 95/60 (BP Location: Right Arm)   Pulse 78   Temp 98.1 F (36.7 C) (Oral)   Resp 20   Ht 5' 2 (1.575 m)   Wt 62.7 kg   SpO2 92%   BMI 25.28 kg/m   General: 64 y.o. year-old female well developed well nourished in no acute distress.  Alert and oriented  x3. Cardiovascular: Regular rate and rhythm with no rubs or gallops.  No thyromegaly or JVD noted.  No lower extremity edema. 2/4 pulses in all 4 extremities. Respiratory: Clear to auscultation with no wheezes or rales. Good inspiratory effort. Abdomen: Soft nontender nondistended with normal bowel sounds x4 quadrants. Muskuloskeletal: No cyanosis, clubbing or edema noted bilaterally Neuro: CN II-XII intact, strength, sensation, reflexes Skin: No ulcerative lesions noted or rashes Psychiatry: Judgement and insight appear normal. Mood is appropriate for condition and setting          Labs on Admission:  Basic Metabolic Panel: Recent Labs  Lab 11/25/24 1848  NA 133*  K 3.8  CL 94*  CO2 21*  GLUCOSE 106*  BUN 11  CREATININE 1.01*  CALCIUM  8.9   Liver Function Tests: Recent Labs  Lab 11/25/24 1848  AST 13*  ALT <  5  ALKPHOS 103  BILITOT 0.9  PROT 7.2  ALBUMIN 3.5   No results for input(s): LIPASE, AMYLASE in the last 168 hours. No results for input(s): AMMONIA in the last 168 hours. CBC: Recent Labs  Lab 11/25/24 1848  WBC 32.9*  NEUTROABS 25.3*  HGB 14.4  HCT 44.6  MCV 83.7  PLT 344   Cardiac Enzymes: No results for input(s): CKTOTAL, CKMB, CKMBINDEX, TROPONINI in the last 168 hours.  BNP (last 3 results) Recent Labs    04/02/24 1353  BNP 93.3    ProBNP (last 3 results) No results for input(s): PROBNP in the last 8760 hours.  CBG: No results for input(s): GLUCAP in the last 168 hours.  Radiological Exams on Admission: CT Angio Chest PE W and/or Wo Contrast Result Date: 11/25/2024 CLINICAL DATA:  Shortness of breath EXAM: CT ANGIOGRAPHY CHEST WITH CONTRAST TECHNIQUE: Multidetector CT imaging of the chest was performed using the standard protocol during bolus administration of intravenous contrast. Multiplanar CT image reconstructions and MIPs were obtained to evaluate the vascular anatomy. RADIATION DOSE REDUCTION: This exam was  performed according to the departmental dose-optimization program which includes automated exposure control, adjustment of the mA and/or kV according to patient size and/or use of iterative reconstruction technique. CONTRAST:  75mL OMNIPAQUE  IOHEXOL  350 MG/ML SOLN COMPARISON:  Chest x-ray 11/25/2024, chest CT 11/29/2023 FINDINGS: Cardiovascular: Satisfactory opacification of the pulmonary arteries to the segmental level. No evidence of pulmonary embolism. Mild aortic atherosclerosis. Ectatic ascending aorta up to 3.6 cm. Coronary vascular calcification. Cardiomegaly. No pericardial effusion. Left-sided pacing device with multiple intracardiac leads. Mediastinum/Nodes: Patent trachea. No thyroid  mass. Enlarged mediastinal lymph nodes. Superior right paratracheal node measuring 12 mm. AP window lymph nodes measuring up to 10 mm. Precarinal nodes measuring up to 10 mm. Subcarinal nodes measuring up to 16 mm. Right hilar nodes measuring up to 12 mm. Esophagus unremarkable Lungs/Pleura: Moderate slightly loculated appearing right pleural effusion. Small amount of loculated fluid along the right major fissure. Mild emphysema. 3 mm left apical lung nodule on series 12, image 27. This is unchanged. Partial consolidation in the right lower lobe Upper Abdomen: No acute finding Musculoskeletal: No acute osseous abnormality Review of the MIP images confirms the above findings. IMPRESSION: 1. Negative for acute pulmonary embolus. 2. Moderate slightly loculated appearing right pleural effusion. Partial consolidation in the right lower lobe, atelectasis versus pneumonia. 3. Cardiomegaly. 4. Mild mediastinal and right hilar adenopathy, nonspecific, but potentially reactive. Attention on chest CT follow-up imaging. 5. Emphysema. Pulmonary emphysema is an independent risk factor for lung cancer. The patient is already enrolled in lung cancer chest CT screening program Aortic Atherosclerosis (ICD10-I70.0) and Emphysema (ICD10-J43.9).  Electronically Signed   By: Luke Bun M.D.   On: 11/25/2024 21:47   DG Chest Port 1 View Result Date: 11/25/2024 EXAM: 2 Views XRAY of the Chest 11/25/2024 06:54:17 PM COMPARISON: None available. CLINICAL HISTORY: FINDINGS: LINES, TUBES AND DEVICES: Left triple lead cardiac device in place. LUNGS AND PLEURA: Moderate loculated right pleural effusion. There is patchy atelectasis/airspace disease in the right lower lobe. HEART AND MEDIASTINUM: No acute abnormality of the cardiac and mediastinal silhouettes. BONES AND SOFT TISSUES: No acute osseous abnormality. IMPRESSION: 1. Moderate loculated right pleural effusion. 2. Patchy right lower lobe atelectasis or airspace disease. Electronically signed by: Greig Pique MD 11/25/2024 07:02 PM EST RP Workstation: [id number]    EKG: I independently viewed the EKG done and my findings are as followed: ***   Assessment/Plan Present on Admission:  CAP (community acquired pneumonia)  Principal Problem:   CAP (community acquired pneumonia)   DVT prophylaxis: ***   Code Status: ***   Family Communication: ***   Disposition Plan: ***   Consults called: ***   Admission status: ***    Status is: Inpatient {Inpatient:23812}   Terry LOISE Hurst MD [facility] Pager [phone]  If 7PM-7AM, please contact night-coverage [email or web address] Password [email or web address]  11/26/2024, 12:01 AM        [1] Allergies Allergen Reactions   Penicillins Nausea And Vomiting    Fever  "

## 2024-11-26 NOTE — Plan of Care (Signed)
  Problem: Education: Goal: Knowledge of General Education information will improve Description: Including pain rating scale, medication(s)/side effects and non-pharmacologic comfort measures Outcome: Progressing   Problem: Clinical Measurements: Goal: Ability to maintain clinical measurements within normal limits will improve Outcome: Progressing Goal: Respiratory complications will improve Outcome: Progressing Goal: Cardiovascular complication will be avoided Outcome: Progressing   Problem: Activity: Goal: Risk for activity intolerance will decrease Outcome: Progressing   Problem: Coping: Goal: Level of anxiety will decrease Outcome: Progressing   Problem: Safety: Goal: Ability to remain free from injury will improve Outcome: Progressing

## 2024-11-26 NOTE — Hospital Course (Signed)
"   64 y.o. 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  She denies any fevers or chills, chest pain, nausea, vomiting, diarrhea.  In the ED, the patient was afebrile and hemodynamically stable albeit with soft blood pressures.  Oxygen  saturation was 92-94% on 2 L.  WBC 32.9, hemoglobin 14.4, platelet 344.  CTA chest was negative for PE but showed a moderate right pleural effusion that was slightly loculated.  There was a partial consolidation of the right lower lobe.  The patient was started on ceftriaxone  and azithromycin .  Thoracocentesis was ordered. "

## 2024-11-26 NOTE — Procedures (Signed)
 Ultrasound-guided diagnostic and therapeutic right sided thoracentesis performed yielding 90 ml of amber colored fluid. No immediate complications.   Diagnostic fluid was sent to the lab for further analysis. Follow-up chest x-ray pending. EBL is < 2 ml.

## 2024-11-26 NOTE — H&P (Addendum)
 " History and Physical  Whitney May FMW:[id number] DOB: 1961/08/09 DOA: 11/25/2024  Referring physician: Dr. Dean, EDP  PCP: Alston Silvio BROCKS, FNP  Outpatient Specialists: Cardiology, pulmonary. Patient coming from: Home.  Chief Complaint: Shortness of breath and productive cough.  HPI: Whitney May is a 64 y.o. 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  No reported subjective fevers or chills.  Also endorses back of left knee pain.  In the ER, tachypneic with respiration rate of 24.  Chest x-ray showing moderate loculated right pleural effusion and patchy right lower lobe atelectasis or airspace disease.  The patient subsequently had a CT angio chest which revealed negative for acute pulmonary embolism, moderate-sized loculated appearing right pleural effusion.  Partial consolidation in the right lower lobe, atelectasis versus pneumonia.  Cardiomegaly.  Mild mediastinal and right hilar adenopathy, nonspecific but potentially reactive.  Attention on chest CT follow-up imaging.  Emphysema.  Pulmonary emphysema.  The patient is already enrolled in lung cancer chest CT screening program.  Aortic atherosclerosis.  Labs today notable for leukocytosis 33K.  The patient was started on empiric IV antibiotics Rocephin  and azithromycin  in the ER.  TRH, hospitalist service, was asked to admit for further management of community-acquired pneumonia.  ED Course: Temperature 98.6.  BP 92/55, pulse 74.  Respiratory rate 19, O2 saturation 91% on 2 L.  Review of Systems: Review of systems as noted in the HPI. All  other systems reviewed and are negative.   Past Medical History:  Diagnosis Date   Alcohol use    CAD (coronary artery disease)    Chest pain    Hyperlipidemia, mixed    Hypotension    Hypothyroidism    Ischemic cardiomyopathy    LBBB (left bundle branch block)    MI (myocardial infarction) (HCC)    Tobacco abuse    Past Surgical History:  Procedure Laterality Date   BIV ICD INSERTION CRT-D N/A 05/16/2024   Procedure: BIV ICD INSERTION CRT-D;  Surgeon: Nancey Eulas BRAVO, MD;  Location: [facility] INVASIVE CV LAB;  Service: Cardiovascular;  Laterality: N/A;   CESAREAN SECTION     CORONARY STENT INTERVENTION N/A 03/07/2019   Procedure: CORONARY STENT INTERVENTION;  Surgeon: Wonda Sharper, MD;  Location: [facility] INVASIVE CV LAB;  Service: Cardiovascular;  Laterality: N/A;   CORONARY/GRAFT ACUTE MI REVASCULARIZATION N/A 08/02/2018   Procedure: Coronary/Graft Acute MI Revascularization;  Surgeon: Anner Alm ORN, MD;  Location: [facility] INVASIVE CV LAB;  Service: Cardiovascular;  Laterality: N/A;   CORONARY/GRAFT ACUTE MI REVASCULARIZATION N/A 09/18/2022   Procedure: Coronary/Graft Acute MI Revascularization;  Surgeon: Court Dorn PARAS, MD;  Location: [facility] INVASIVE CV LAB;  Service: Cardiovascular;  Laterality: N/A;   ICD IMPLANT Left    LEAD REVISION/REPAIR N/A 07/03/2024   Procedure: LEAD REVISION/REPAIR;  Surgeon: Inocencio Soyla Lunger, MD;  Location: [facility] INVASIVE CV LAB;  Service: Cardiovascular;  Laterality: N/A;   LEFT HEART CATH AND CORONARY ANGIOGRAPHY N/A 08/02/2018   Procedure: LEFT HEART CATH AND CORONARY ANGIOGRAPHY;  Surgeon: Anner Alm ORN, MD;  Location: [facility] INVASIVE CV LAB;  Service: Cardiovascular;  Laterality: N/A;   LEFT HEART CATH AND CORONARY ANGIOGRAPHY N/A 03/07/2019  Procedure: LEFT HEART CATH AND CORONARY ANGIOGRAPHY;  Surgeon: Cherrie Toribio SAUNDERS, MD;  Location: [facility] INVASIVE CV LAB;  Service: Cardiovascular;  Laterality: N/A;   LEFT HEART CATH AND CORONARY ANGIOGRAPHY N/A  09/18/2022   Procedure: LEFT HEART CATH AND CORONARY ANGIOGRAPHY;  Surgeon: Court Dorn PARAS, MD;  Location: [facility] INVASIVE CV LAB;  Service: Cardiovascular;  Laterality: N/A;   LEFT HEART CATH AND CORONARY ANGIOGRAPHY N/A 04/16/2023   Procedure: LEFT HEART CATH AND CORONARY ANGIOGRAPHY;  Surgeon: Anner Alm ORN, MD;  Location: [facility] INVASIVE CV LAB;  Service: Cardiovascular;  Laterality: N/A;    Social History:  reports that she has been smoking cigarettes. She started smoking about 28 years ago. She has a 22 pack-year smoking history. She has never used smokeless tobacco. She reports current alcohol use of about 8.0 standard drinks of alcohol per week. She reports that she does not use drugs.   Allergies[1]  Family History  Problem Relation Age of Onset   Coronary artery disease Mother    Coronary artery disease Maternal Grandmother    Hypertension Father    Diabetes Father       Prior to Admission medications  Medication Sig Start Date End Date Taking? Authorizing Provider  acetaminophen  (TYLENOL ) 500 MG tablet Take 1,000 mg by mouth every 6 (six) hours as needed for moderate pain (pain score 4-6).    [provider]  aspirin  81 MG chewable tablet Chew 1 tablet (81 mg total) by mouth daily. 06/08/23   Glena Harlene HERO, FNP  bisoprolol  (ZEBETA ) 5 MG tablet TAKE 1/2 TABLET BY MOUTH ONCE DAILY 10/16/24   McLean, Dalton S, MD  calcium  carbonate (TUMS - DOSED IN MG ELEMENTAL CALCIUM ) 500 MG chewable tablet Chew 1 tablet by mouth daily as needed for indigestion or heartburn.    [provider]  empagliflozin  (JARDIANCE ) 10 MG TABS tablet TAKE 1 TABLET BY MOUTH DAILY BEFORE BREAKFAST 11/14/24   Rolan Ezra RAMAN, MD  furosemide  (LASIX ) 20 MG tablet Take 1 tablet (20 mg total) by mouth daily as needed for weight gain of 3 lbs in 24 hours or 5 lbs in a week 04/02/24 11/13/24  Lee, Jordan, NP  isosorbide  mononitrate (IMDUR ) 30 MG 24 hr tablet TAKE 1 TABLET BY MOUTH DAILY 10/16/24    Rolan Ezra RAMAN, MD  levothyroxine  (SYNTHROID ) 100 MCG tablet Take 1 tablet (100 mcg total) by mouth daily at 6 (six) AM. 04/18/23 11/13/24  Christobal Guadalajara, MD  losartan  (COZAAR ) 25 MG tablet TAKE 1/2 TABLET BY MOUTH DAILY 09/04/24   McLean, Dalton S, MD  nitroGLYCERIN  (NITROSTAT ) 0.4 MG SL tablet Place 1 tablet (0.4 mg total) under the tongue every 5 (five) minutes x 3 doses as needed for chest pain. 07/05/23   Rolan Ezra RAMAN, MD  potassium chloride  SA (KLOR-CON  M20) 20 MEQ tablet Take one tablet (20 mEq total) by mouth Thursday and Friday morning 05/14/24   Mealor, Augustus E, MD  ranolazine  (RANEXA ) 500 MG 12 hr tablet Take 1 tablet (500 mg total) by mouth 2 (two) times daily. PLEASE SCHEDULE APPOINTMENT FOR MORE REFILLS [phone] OPTION 2 11/14/24   Rolan Ezra RAMAN, MD  rosuvastatin  (CRESTOR ) 40 MG tablet Take 1 tablet (40 mg total) by mouth daily. PLEASE SCHEDULE APPOINTMENT FOR MORE REFILLS [phone] OPTION 2 10/27/24   Rolan Ezra RAMAN, MD  ticagrelor  (BRILINTA ) 90 MG TABS tablet Take 1 tablet (90 mg total) by mouth 2 (two) times daily. PLEASE SCHEDULE APPOINTMENT FOR MORE REFILLS [phone] OPTION 2 09/08/24  Rolan Ezra RAMAN, MD    Physical Exam: BP 95/60 (BP Location: Right Arm)   Pulse 78   Temp 98.1 F (36.7 C) (Oral)   Resp 20   Ht 5' 2 (1.575 m)   Wt 62.7 kg   SpO2 92%   BMI 25.28 kg/m   General: 64 y.o. year-old female well developed well nourished in no acute distress.  Alert and oriented x3. Cardiovascular: Regular rate and rhythm with no rubs or gallops.  No thyromegaly or JVD noted.  Trace lower extremity bilaterally.   Respiratory: Diffuse rales bilaterally.  Poor inspiratory effort. Abdomen: Soft nontender nondistended with normal bowel sounds x4 quadrants. Muskuloskeletal: No cyanosis or clubbing noted bilaterally.  Left lower extremity slightly more edematous than the right lower extremity. Neuro: CN II-XII intact, strength, sensation, reflexes Skin: No ulcerative  lesions noted or rashes Psychiatry: Judgement and insight appear normal. Mood is appropriate for condition and setting          Labs on Admission:  Basic Metabolic Panel: Recent Labs  Lab 11/25/24 1848  NA 133*  K 3.8  CL 94*  CO2 21*  GLUCOSE 106*  BUN 11  CREATININE 1.01*  CALCIUM  8.9   Liver Function Tests: Recent Labs  Lab 11/25/24 1848  AST 13*  ALT <5  ALKPHOS 103  BILITOT 0.9  PROT 7.2  ALBUMIN 3.5   No results for input(s): LIPASE, AMYLASE in the last 168 hours. No results for input(s): AMMONIA in the last 168 hours. CBC: Recent Labs  Lab 11/25/24 1848  WBC 32.9*  NEUTROABS 25.3*  HGB 14.4  HCT 44.6  MCV 83.7  PLT 344   Cardiac Enzymes: No results for input(s): CKTOTAL, CKMB, CKMBINDEX, TROPONINI in the last 168 hours.  BNP (last 3 results) Recent Labs    04/02/24 1353  BNP 93.3    ProBNP (last 3 results) No results for input(s): PROBNP in the last 8760 hours.  CBG: No results for input(s): GLUCAP in the last 168 hours.  Radiological Exams on Admission: CT Angio Chest PE W and/or Wo Contrast Result Date: 11/25/2024 CLINICAL DATA:  Shortness of breath EXAM: CT ANGIOGRAPHY CHEST WITH CONTRAST TECHNIQUE: Multidetector CT imaging of the chest was performed using the standard protocol during bolus administration of intravenous contrast. Multiplanar CT image reconstructions and MIPs were obtained to evaluate the vascular anatomy. RADIATION DOSE REDUCTION: This exam was performed according to the departmental dose-optimization program which includes automated exposure control, adjustment of the mA and/or kV according to patient size and/or use of iterative reconstruction technique. CONTRAST:  75mL OMNIPAQUE  IOHEXOL  350 MG/ML SOLN COMPARISON:  Chest x-ray 11/25/2024, chest CT 11/29/2023 FINDINGS: Cardiovascular: Satisfactory opacification of the pulmonary arteries to the segmental level. No evidence of pulmonary embolism. Mild aortic  atherosclerosis. Ectatic ascending aorta up to 3.6 cm. Coronary vascular calcification. Cardiomegaly. No pericardial effusion. Left-sided pacing device with multiple intracardiac leads. Mediastinum/Nodes: Patent trachea. No thyroid  mass. Enlarged mediastinal lymph nodes. Superior right paratracheal node measuring 12 mm. AP window lymph nodes measuring up to 10 mm. Precarinal nodes measuring up to 10 mm. Subcarinal nodes measuring up to 16 mm. Right hilar nodes measuring up to 12 mm. Esophagus unremarkable Lungs/Pleura: Moderate slightly loculated appearing right pleural effusion. Small amount of loculated fluid along the right major fissure. Mild emphysema. 3 mm left apical lung nodule on series 12, image 27. This is unchanged. Partial consolidation in the right lower lobe Upper Abdomen: No acute finding Musculoskeletal: No acute osseous abnormality Review of the  MIP images confirms the above findings. IMPRESSION: 1. Negative for acute pulmonary embolus. 2. Moderate slightly loculated appearing right pleural effusion. Partial consolidation in the right lower lobe, atelectasis versus pneumonia. 3. Cardiomegaly. 4. Mild mediastinal and right hilar adenopathy, nonspecific, but potentially reactive. Attention on chest CT follow-up imaging. 5. Emphysema. Pulmonary emphysema is an independent risk factor for lung cancer. The patient is already enrolled in lung cancer chest CT screening program Aortic Atherosclerosis (ICD10-I70.0) and Emphysema (ICD10-J43.9). Electronically Signed   By: Luke Bun M.D.   On: 11/25/2024 21:47   DG Chest Port 1 View Result Date: 11/25/2024 EXAM: 2 Views XRAY of the Chest 11/25/2024 06:54:17 PM COMPARISON: None available. CLINICAL HISTORY: FINDINGS: LINES, TUBES AND DEVICES: Left triple lead cardiac device in place. LUNGS AND PLEURA: Moderate loculated right pleural effusion. There is patchy atelectasis/airspace disease in the right lower lobe. HEART AND MEDIASTINUM: No acute  abnormality of the cardiac and mediastinal silhouettes. BONES AND SOFT TISSUES: No acute osseous abnormality. IMPRESSION: 1. Moderate loculated right pleural effusion. 2. Patchy right lower lobe atelectasis or airspace disease. Electronically signed by: Greig Pique MD 11/25/2024 07:02 PM EST RP Workstation: [id number]    EKG: I independently viewed the EKG done and my findings are as followed: Atrial sensed ventricular paced rhythm.  Rate of 71 QTc 496.  Assessment/Plan Present on Admission:  CAP (community acquired pneumonia)  Principal Problem:   CAP (community acquired pneumonia)  Sepsis secondary to community-acquired pneumonia, POA Moderate-sized loculated appearing right pleural effusion. Presented with leukocytosis, 33K, tachypnea 28, pneumonia seen on CT scan Follow peripheral blood cultures x 2. Start empiric IV antibiotics Rocephin  and azithromycin  Monitor fever curve and WBCs Maintain MAP greater than 65  Moderate-sized loculated appearing right pleural effusion, POA. Thoracentesis by IR for therapeutic and diagnostic purposes Repeat chest x-ray postthoracentesis.  Chronic HFrEF Ischemic cardiomyopathy Euvolemic on exam. Last 2D echo done on 07/04/2024 revealed LVEF 25 to 30% with left ventricle global hypokinesis.  Pericardial effusion anterior to the right ventricle. Monitor strict I's and O's and daily weight  Coronary artery disease status post PCI with stent placement No reports of chest pain. Resume home aspirin , Brilinta , and Crestor . Resume home statin  Ischemic cardiomyopathy status post ICD insertion with RV ICD macroperforation Follows with cardiology Resume home regimen  Left lower extremity edema and pain behind the knee, POA Left lower extremity ultrasound Pain control.  Hypothyroidism Resume home levothyroxine .  History of multiple Mis Chronic LBBB Resume home regimen.  Hyperlipidemia Resume home statin  Current tobacco user Counseled on  the importance of complete tobacco use. The patient was receptive and requested nicotine  patch. Was previously smoking 1 pack/day, cut down a few days ago to 5 cigarettes/day.    Critical care time: 55 minutes.    DVT prophylaxis: Subcu Lovenox  today.  Code Status: Full code.  Family Communication: None at bedside.  Disposition Plan: Admitted to telemetry unit.  Consults called: None.  Admission status: Inpatient status.   Status is: Inpatient The patient requires at least 2 midnights for further evaluation and treatment of present condition.   Terry LOISE Hurst MD [facility] Pager [phone]  If 7PM-7AM, please contact night-coverage [email or web address] Password [email or web address]  11/26/2024, 12:01 AM      [1]  Allergies Allergen Reactions   Penicillins Nausea And Vomiting    Fever   "

## 2024-11-26 NOTE — Plan of Care (Signed)

## 2024-11-26 NOTE — Progress Notes (Signed)
 "          PROGRESS NOTE  Whitney May FMW:[id number] DOB: Sep 16, 1961 DOA: 11/25/2024 PCP: Al[place] Silvio BROCKS, FNP  Brief History:   64 y.o. 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  She denies any fevers or chills, chest pain, nausea, vomiting, diarrhea.  In the ED, the patient was afebrile and hemodynamically stable albeit with soft blood pressures.  Oxygen  saturation was 92-94% on 2 L.  WBC 32.9, hemoglobin 14.4, platelet 344.  CTA chest was negative for PE but showed a moderate right pleural effusion that was slightly loculated.  There was a partial consolidation of the right lower lobe.  The patient was started on ceftriaxone  and azithromycin .  Thoracocentesis was ordered.   Assessment/Plan: Sepsis secondary to community-acquired pneumonia, POA Moderate-sized loculated right pleural effusion. -Presented with leukocytosis, 33K, tachypnea 28, pneumonia  -Follow peripheral blood cultures x 2. -Lactic acid peaked  1.9 -continue empiric IV antibiotics Rocephin  and azithromycin  -11/26/24 diagnostic Thoracocentesis   Moderate-sized loculated right pleural effusion, POA. --11/26/24 diagnostic Thoracocentesis -Follow cell count and cultures - WBC 2025 (35 N, 54 Eos, 9 mono) - gram stain--no organism  Acute respiratory failure with hypoxia -Presented with tachypnea and oxygen  saturation 90% on room air. -Stable on 2 L nasal cannula -Wean as tolerated for saturation greater than 92%  Hypotension -review of records shows pt has had soft BPs in 90s -continue judicious fluids -add midodrine    Chronic HFrEF Ischemic  cardiomyopathy -Clinically euvolemic -Echo 07/04/2024 revealed LVEF 25 to 30% with left ventricle global hypokinesis.  Pericardial effusion anterior to the right ventricle. -Daily weights   Coronary artery disease status post PCI with stent placement No reports of chest pain. Resume home aspirin , Brilinta , and Crestor . Resume home statin -continue ranexa    Ischemic cardiomyopathy status post ICD insertion with RV ICD macroperforation Follows with cardiology Resume home regimen   Left lower extremity edema and pain behind the knee, POA Left lower extremity ultrasound Pain control.   Hypothyroidism Resume home levothyroxine .   Chronic LBBB Resume home regimen.   Hyperlipidemia Resume home statin   Current tobacco user Counseled on the importance of complete tobacco use. -nicoderm patch -Was previously smoking 1 pack/day, cut down a few days ago to 5 cigarettes/day this past week        Family Communication:   no Family at bedside  Consultants:  none  Code Status:  FULL   DVT Prophylaxis:  [place] Lovenox    Procedures: As Listed in Progress Note Above  Antibiotics: Ceftriaxone  1/20>> Azithro 1/20>>      Subjective: Pt states sob is better than yesterday, but remains sob with exertion.  Denies cp, n/v/d, abd pain, f/c  Objective: Vitals:   11/26/24 0500 11/26/24 0727 11/26/24 0822 11/26/24 0926  BP:  (!) 92/56  (!) 131/105  Pulse:      Resp:  17  18  Temp:  97.7 F (36.5 C)  (!) 97.5 F (36.4 C)  TempSrc:  Oral    SpO2:  94% 92% 93%  Weight: 62.7 kg     Height:        Intake/Output Summary (Last 24 hours) at 11/26/2024 0951 Last data filed at 11/26/2024 9083 Gross  per 24 hour  Intake 2060 ml  Output --  Net 2060 ml   Weight change:  Exam:  General:  Pt is alert, follows commands appropriately, not in acute distress HEENT: No icterus, No thrush, No neck mass, [place]/AT Cardiovascular: RRR, S1/S2, no rubs, no gallops Respiratory: bibasilar  rales.  No wheeze Abdomen: Soft/+BS, non tender, non distended, no guarding Extremities: No edema, No lymphangitis, No petechiae, No rashes, no synovitis   Data Reviewed: I have personally reviewed following labs and imaging studies Basic Metabolic Panel: Recent Labs  Lab 11/25/24 1848 11/26/24 0454  NA 133* 136  K 3.8 3.9  CL 94* 98  CO2 21* 26  GLUCOSE 106* 149*  BUN 11 15  CREATININE 1.01* 1.09*  CALCIUM  8.9 8.7*  MG  --  2.2  PHOS  --  4.2   Liver Function Tests: Recent Labs  Lab 11/25/24 1848  AST 13*  ALT <5  ALKPHOS 103  BILITOT 0.9  PROT 7.2  ALBUMIN 3.5   No results for input(s): LIPASE, AMYLASE in the last 168 hours. No results for input(s): AMMONIA in the last 168 hours. Coagulation Profile: Recent Labs  Lab 11/25/24 1848  INR 1.2   CBC: Recent Labs  Lab 11/25/24 1848 11/26/24 0454  WBC 32.9* 29.1*  NEUTROABS 25.3*  --   HGB 14.4 12.7  HCT 44.6 40.2  MCV 83.7 84.3  PLT 344 307   Cardiac Enzymes: No results for input(s): CKTOTAL, CKMB, CKMBINDEX, TROPONINI in the last 168 hours. BNP: Invalid input(s): POCBNP CBG: No results for input(s): GLUCAP in the last 168 hours. HbA1C: No results for input(s): HGBA1C in the last 72 hours. Urine analysis:    Component Value Date/Time   COLORURINE YELLOW 04/13/2023 1036   APPEARANCEUR CLEAR 04/13/2023 1036   LABSPEC 1.015 04/13/2023 1036   PHURINE 5.0 04/13/2023 1036   GLUCOSEU 150 (A) 04/13/2023 1036   HGBUR SMALL (A) 04/13/2023 1036   BILIRUBINUR NEGATIVE 04/13/2023 1036   KETONESUR NEGATIVE 04/13/2023 1036   PROTEINUR NEGATIVE 04/13/2023 1036   NITRITE NEGATIVE 04/13/2023 1036   LEUKOCYTESUR TRACE (A) 04/13/2023 1036   Sepsis Labs: @LABRCNTIP (procalcitonin:4,lacticidven:4) ) Recent Results (from the past 240 hours)  Culture, blood (routine x 2)     Status: None (Preliminary result)   Collection Time: 11/25/24  7:14 PM   Specimen: BLOOD  Result Value Ref Range  Status   Specimen Description BLOOD LEFT ANTECUBITAL  Final   Special Requests   Final    BOTTLES DRAWN AEROBIC AND ANAEROBIC Blood Culture results may not be optimal due to an inadequate volume of blood received in culture bottles   Culture   Final    NO GROWTH < 12 HOURS Performed at [facility], [address], [place], [place] [address]    Report Status PENDING  Incomplete  Culture, blood (routine x 2)     Status: None (Preliminary result)   Collection Time: 11/25/24  7:18 PM   Specimen: BLOOD  Result Value Ref Range Status   Specimen Description BLOOD RIGHT ANTECUBITAL  Final   Special Requests   Final    BOTTLES DRAWN AEROBIC AND ANAEROBIC Blood Culture results may not be optimal due to an inadequate volume of blood received in culture bottles   Culture   Final    NO GROWTH < 12 HOURS Performed at [facility], [address], [place], [place] [address]    Report Status PENDING  Incomplete  Resp panel by RT-PCR (RSV, Flu A&B, Covid)  Anterior Nasal Swab     Status: None   Collection Time: 11/25/24  7:22 PM   Specimen: Anterior Nasal Swab  Result Value Ref Range Status   SARS Coronavirus 2 by RT PCR NEGATIVE NEGATIVE Final    Comment: (NOTE) SARS-CoV-2 target nucleic acids are NOT DETECTED.  The SARS-CoV-2 RNA is generally detectable in upper respiratory specimens during the acute phase of infection. The lowest concentration of SARS-CoV-2 viral copies this assay can detect is 138 copies/mL. A negative result does not preclude SARS-Cov-2 infection and should not be used as the sole basis for treatment or other patient management decisions. A negative result may occur with  improper specimen collection/handling, submission of specimen other than nasopharyngeal swab, presence of viral mutation(s) within the areas targeted by this assay, and inadequate number of viral copies(<138 copies/mL). A negative result must be combined with clinical observations, patient history, and  epidemiological information. The expected result is Negative.  Fact Sheet for Patients:  bloggercourse.com  Fact Sheet for Healthcare Providers:  seriousbroker.it  This test is no t yet approved or cleared by the [place]  FDA and  has been authorized for detection and/or diagnosis of SARS-CoV-2 by FDA under an Emergency Use Authorization (EUA). This EUA will remain  in effect (meaning this test can be used) for the duration of the COVID-19 declaration under Section 564(b)(1) of the Act, 21 U.S.C.section 360bbb-3(b)(1), unless the authorization is terminated  or revoked sooner.       Influenza A by PCR NEGATIVE NEGATIVE Final   Influenza B by PCR NEGATIVE NEGATIVE Final    Comment: (NOTE) The Xpert Xpress SARS-CoV-2/FLU/RSV plus assay is intended as an aid in the diagnosis of influenza from Nasopharyngeal swab specimens and should not be used as a sole basis for treatment. Nasal washings and aspirates are unacceptable for Xpert Xpress SARS-CoV-2/FLU/RSV testing.  Fact Sheet for Patients: bloggercourse.com  Fact Sheet for Healthcare Providers: seriousbroker.it  This test is not yet approved or cleared by the [place]  FDA and has been authorized for detection and/or diagnosis of SARS-CoV-2 by FDA under an Emergency Use Authorization (EUA). This EUA will remain in effect (meaning this test can be used) for the duration of the COVID-19 declaration under Section 564(b)(1) of the Act, 21 U.S.C. section 360bbb-3(b)(1), unless the authorization is terminated or revoked.     Resp Syncytial Virus by PCR NEGATIVE NEGATIVE Final    Comment: (NOTE) Fact Sheet for Patients: bloggercourse.com  Fact Sheet for Healthcare Providers: seriousbroker.it  This test is not yet approved or cleared by the [place]  FDA and has been  authorized for detection and/or diagnosis of SARS-CoV-2 by FDA under an Emergency Use Authorization (EUA). This EUA will remain in effect (meaning this test can be used) for the duration of the COVID-19 declaration under Section 564(b)(1) of the Act, 21 U.S.C. section 360bbb-3(b)(1), unless the authorization is terminated or revoked.  Performed at [facility], [address], [place], [place] [address]      Scheduled Meds:  aspirin   81 mg Oral Daily   azithromycin   500 mg Oral Daily   enoxaparin  (LOVENOX ) injection  40 mg Subcutaneous Q24H   ipratropium-albuterol   3 mL Nebulization Q6H   levothyroxine   100 mcg Oral Q0600   lidocaine  HCl (PF)  10 mL Intradermal Once   nicotine   14 mg Transdermal Daily   ranolazine   500 mg Oral BID   rosuvastatin   40 mg Oral Daily   ticagrelor   90 mg Oral BID  Continuous Infusions:  cefTRIAXone  (ROCEPHIN )  IV      Procedures/Studies: CT Angio Chest PE W and/or Wo Contrast Result Date: 11/25/2024 CLINICAL DATA:  Shortness of breath EXAM: CT ANGIOGRAPHY CHEST WITH CONTRAST TECHNIQUE: Multidetector CT imaging of the chest was performed using the standard protocol during bolus administration of intravenous contrast. Multiplanar CT image reconstructions and MIPs were obtained to evaluate the vascular anatomy. RADIATION DOSE REDUCTION: This exam was performed according to the departmental dose-optimization program which includes automated exposure control, adjustment of the mA and/or kV according to patient size and/or use of iterative reconstruction technique. CONTRAST:  75mL OMNIPAQUE  IOHEXOL  350 MG/ML SOLN COMPARISON:  Chest x-ray 11/25/2024, chest CT 11/29/2023 FINDINGS: Cardiovascular: Satisfactory opacification of the pulmonary arteries to the segmental level. No evidence of pulmonary embolism. Mild aortic atherosclerosis. Ectatic ascending aorta up to 3.6 cm. Coronary vascular calcification. Cardiomegaly. No pericardial effusion. Left-sided pacing device  with multiple intracardiac leads. Mediastinum/Nodes: Patent trachea. No thyroid  mass. Enlarged mediastinal lymph nodes. Superior right paratracheal node measuring 12 mm. AP window lymph nodes measuring up to 10 mm. Precarinal nodes measuring up to 10 mm. Subcarinal nodes measuring up to 16 mm. Right hilar nodes measuring up to 12 mm. Esophagus unremarkable Lungs/Pleura: Moderate slightly loculated appearing right pleural effusion. Small amount of loculated fluid along the right major fissure. Mild emphysema. 3 mm left apical lung nodule on series 12, image 27. This is unchanged. Partial consolidation in the right lower lobe Upper Abdomen: No acute finding Musculoskeletal: No acute osseous abnormality Review of the MIP images confirms the above findings. IMPRESSION: 1. Negative for acute pulmonary embolus. 2. Moderate slightly loculated appearing right pleural effusion. Partial consolidation in the right lower lobe, atelectasis versus pneumonia. 3. Cardiomegaly. 4. Mild mediastinal and right hilar adenopathy, nonspecific, but potentially reactive. Attention on chest CT follow-up imaging. 5. Emphysema. Pulmonary emphysema is an independent risk factor for lung cancer. The patient is already enrolled in lung cancer chest CT screening program Aortic Atherosclerosis (ICD10-I70.0) and Emphysema (ICD10-J43.9). Electronically Signed   By: Luke Bun M.D.   On: 11/25/2024 21:47   DG Chest Port 1 View Result Date: 11/25/2024 EXAM: 2 Views XRAY of the Chest 11/25/2024 06:54:17 PM COMPARISON: None available. CLINICAL HISTORY: FINDINGS: LINES, TUBES AND DEVICES: Left triple lead cardiac device in place. LUNGS AND PLEURA: Moderate loculated right pleural effusion. There is patchy atelectasis/airspace disease in the right lower lobe. HEART AND MEDIASTINUM: No acute abnormality of the cardiac and mediastinal silhouettes. BONES AND SOFT TISSUES: No acute osseous abnormality. IMPRESSION: 1. Moderate loculated right pleural  effusion. 2. Patchy right lower lobe atelectasis or airspace disease. Electronically signed by: Greig Pique MD 11/25/2024 07:02 PM EST RP Workstation: [id number]   CUP PACEART INCLINIC DEVICE CHECK Result Date: 11/13/2024 in-clinic CRT-D (multi-lead) check. Presenting Rhythm: _AS/VP__ . Routine testing was performed. Thresholds, sensing, impedance trend were stable. HF diagnostics are stable. No treated arrhythmia. Patient BiV pacing _>99_ % of the time. Estimated longevity __7.8 years__ . Pt enrolled in remote follow-up. EKG opt, programmed BIiVe to LV only (with LB area pacing lead in LV port) see OV note for full discussion RU   Alm Schneider, DO  [facility]  If 7PM-7AM, please contact night-coverage [email or web address] Password [facility] 11/26/2024, 9:51 AM   LOS: 1 day   "

## 2024-11-27 DIAGNOSIS — Z720 Tobacco use: Secondary | ICD-10-CM | POA: Diagnosis not present

## 2024-11-27 DIAGNOSIS — J90 Pleural effusion, not elsewhere classified: Secondary | ICD-10-CM | POA: Diagnosis not present

## 2024-11-27 DIAGNOSIS — J181 Lobar pneumonia, unspecified organism: Secondary | ICD-10-CM | POA: Diagnosis not present

## 2024-11-27 DIAGNOSIS — J9601 Acute respiratory failure with hypoxia: Secondary | ICD-10-CM | POA: Diagnosis not present

## 2024-11-27 LAB — CBC WITH DIFFERENTIAL/PLATELET
Abs Immature Granulocytes: 0.49 K/uL — ABNORMAL HIGH (ref 0.00–0.07)
Basophils Absolute: 0.1 K/uL (ref 0.0–0.1)
Basophils Relative: 0 %
Eosinophils Absolute: 0 K/uL (ref 0.0–0.5)
Eosinophils Relative: 0 %
HCT: 33.6 % — ABNORMAL LOW (ref 36.0–46.0)
Hemoglobin: 10.9 g/dL — ABNORMAL LOW (ref 12.0–15.0)
Immature Granulocytes: 1 %
Lymphocytes Relative: 2 %
Lymphs Abs: 0.8 K/uL (ref 0.7–4.0)
MCH: 27.4 pg (ref 26.0–34.0)
MCHC: 32.4 g/dL (ref 30.0–36.0)
MCV: 84.4 fL (ref 80.0–100.0)
Monocytes Absolute: 2.1 K/uL — ABNORMAL HIGH (ref 0.1–1.0)
Monocytes Relative: 6 %
Neutro Abs: 31.6 K/uL — ABNORMAL HIGH (ref 1.7–7.7)
Neutrophils Relative %: 91 %
Platelets: 324 K/uL (ref 150–400)
RBC: 3.98 MIL/uL (ref 3.87–5.11)
RDW: 14.8 % (ref 11.5–15.5)
WBC: 35 K/uL — ABNORMAL HIGH (ref 4.0–10.5)
nRBC: 0 % (ref 0.0–0.2)

## 2024-11-27 LAB — COMPREHENSIVE METABOLIC PANEL WITH GFR
ALT: 20 U/L (ref 0–44)
AST: 33 U/L (ref 15–41)
Albumin: 2.9 g/dL — ABNORMAL LOW (ref 3.5–5.0)
Alkaline Phosphatase: 111 U/L (ref 38–126)
Anion gap: 10 (ref 5–15)
BUN: 15 mg/dL (ref 8–23)
CO2: 26 mmol/L (ref 22–32)
Calcium: 8.5 mg/dL — ABNORMAL LOW (ref 8.9–10.3)
Chloride: 101 mmol/L (ref 98–111)
Creatinine, Ser: 1.03 mg/dL — ABNORMAL HIGH (ref 0.44–1.00)
GFR, Estimated: >60 mL/min
Glucose, Bld: 164 mg/dL — ABNORMAL HIGH (ref 70–99)
Potassium: 3.6 mmol/L (ref 3.5–5.1)
Sodium: 137 mmol/L (ref 135–145)
Total Bilirubin: 0.2 mg/dL (ref 0.0–1.2)
Total Protein: 5.8 g/dL — ABNORMAL LOW (ref 6.5–8.1)

## 2024-11-27 LAB — GLUCOSE, BODY FLUID OTHER: Glucose, Body Fluid Other: 79 mg/dL

## 2024-11-27 LAB — MRSA NEXT GEN BY PCR, NASAL: MRSA by PCR Next Gen: NOT DETECTED

## 2024-11-27 LAB — PROTEIN, BODY FLUID (OTHER): Total Protein, Body Fluid Other: 4.1 g/dL

## 2024-11-27 LAB — PROCALCITONIN: Procalcitonin: 0.26 ng/mL

## 2024-11-27 LAB — MAGNESIUM: Magnesium: 2.4 mg/dL (ref 1.7–2.4)

## 2024-11-27 MED ORDER — IPRATROPIUM-ALBUTEROL 0.5-2.5 (3) MG/3ML IN SOLN
3.0000 mL | Freq: Three times a day (TID) | RESPIRATORY_TRACT | Status: AC
  Administered 2024-11-28 – 2024-12-03 (×16): 3 mL via RESPIRATORY_TRACT
  Filled 2024-11-27 (×7): qty 3
  Filled 2024-11-27: qty 30
  Filled 2024-11-27 (×9): qty 3

## 2024-11-27 MED ORDER — IPRATROPIUM-ALBUTEROL 0.5-2.5 (3) MG/3ML IN SOLN
3.0000 mL | RESPIRATORY_TRACT | Status: AC | PRN
  Administered 2024-12-04: 3 mL via RESPIRATORY_TRACT
  Filled 2024-11-27: qty 3

## 2024-11-27 MED ORDER — DOXYCYCLINE HYCLATE 100 MG PO TABS
100.0000 mg | ORAL_TABLET | Freq: Two times a day (BID) | ORAL | Status: AC
  Administered 2024-11-28 – 2024-12-12 (×30): 100 mg via ORAL
  Filled 2024-11-27 (×30): qty 1

## 2024-11-27 MED ORDER — CALCIUM CARBONATE ANTACID 500 MG PO CHEW
1.0000 | CHEWABLE_TABLET | Freq: Every day | ORAL | Status: AC
  Administered 2024-11-27 – 2024-12-12 (×16): 200 mg via ORAL
  Filled 2024-11-27 (×16): qty 1

## 2024-11-27 MED ORDER — POTASSIUM CHLORIDE CRYS ER 20 MEQ PO TBCR
40.0000 meq | EXTENDED_RELEASE_TABLET | Freq: Once | ORAL | Status: AC
  Administered 2024-11-27: 40 meq via ORAL
  Filled 2024-11-27: qty 2

## 2024-11-27 NOTE — Progress Notes (Signed)
 "          PROGRESS NOTE  Whitney May FMW:[id number] DOB: 10-10-1961 DOA: 11/25/2024 PCP: Alston Silvio BROCKS, FNP  Brief History:   64 y.o. 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  She denies any fevers or chills, chest pain, nausea, vomiting, diarrhea.  In the ED, the patient was afebrile and hemodynamically stable albeit with soft blood pressures.  Oxygen  saturation was 92-94% on 2 L.  WBC 32.9, hemoglobin 14.4, platelet 344.  CTA chest was negative for PE but showed a moderate right pleural effusion that was slightly loculated.  There was a partial consolidation of the right lower lobe.  The patient was started on ceftriaxone  and azithromycin .  Thoracocentesis was ordered.  Pleural fluid was turbid and showed 2025 WBC. CBC showed rising WBC count up to 35K.  Due to concern for empyema, possibly requiring invasive intervention, pt was transferred to [facility]   Assessment/Plan: Sepsis secondary to community-acquired pneumonia, POA Moderate-sized loculated right pleural effusion. -Presented with leukocytosis, 33K, tachypnea 28, pneumonia  -Follow peripheral blood cultures x 2. -Lactic acid peaked  1.9 -continue empiric IV antibiotics Rocephin  and azithromycin  -11/26/24 diagnostic Thoracocentesis--WBC 2025 - follow pleural fluid culture - concerned about underlying empyema with increasing WBC up to 35 K--may need chest tube - discussed with pulmonary--will consult--please call them upon arrival to [facility]   Moderate-sized loculated right pleural effusion, POA. --11/26/24 diagnostic Thoracocentesis -Follow cultures - WBC 2025 (35  N, 54 Eos, 9 mono) - gram stain--no organism - concerned about underlying empyema with rising WBC - pulmonary consult as discussed above   Acute respiratory failure with hypoxia -Presented with tachypnea and oxygen  saturation 90% on room air. -Stable on 2 L nasal cannula -Wean as tolerated for saturation greater than 92%   Hypotension -review of records shows pt has had soft BPs in 90s -continue judicious fluids -added midodrine >>slowly improving   Chronic HFrEF Ischemic cardiomyopathy -Clinically euvolemic/compensated -Echo 07/04/2024 revealed LVEF 25 to 30% with left ventricle global hypokinesis.  Pericardial effusion anterior to the right ventricle. -Daily weights   Coronary artery disease status post PCI with stent placement No reports of chest pain. Resume home aspirin , Brilinta , and Crestor . Resume home statin -continue ranexa    Ischemic cardiomyopathy status post ICD insertion with RV ICD macroperforation Follows with cardiology Resume home regimen   Left lower extremity edema and pain behind the knee, POA Left lower extremity ultrasound Pain control.   Hypothyroidism Resume home levothyroxine .   Chronic LBBB Resume home regimen.   Hyperlipidemia Resume home statin   Current tobacco user Counseled on the importance of complete tobacco use. -nicoderm patch -Was previously smoking 1 pack/day, cut down a few days ago to 5 cigarettes/day this past week               Family Communication:   sister 1/22   Consultants:  none   Code Status:  FULL    DVT Prophylaxis:  [place] Lovenox      Procedures: As Listed in Progress Note Above   Antibiotics: Ceftriaxone  1/20>> Azithro 1/20>>             Subjective: Pt states her sob is a little better, but she remains  dyspneic with minimal exertion.  Denies cp, abd pain, n/v/d, hemoptysis  Objective: Vitals:   11/26/24 1938 11/26/24 1949 11/27/24 0342 11/27/24 0817  BP: (!) 99/53  (!) 105/49    Pulse: 66  74   Resp: 18  18   Temp: 97.8 F (36.6 C)  98.6 F (37 C)   TempSrc: Oral  Oral   SpO2: 95% 95% 93% 95%  Weight:   65.1 kg   Height:        Intake/Output Summary (Last 24 hours) at 11/27/2024 1035 Last data filed at 11/27/2024 0550 Gross per 24 hour  Intake 680 ml  Output 1150 ml  Net -470 ml   Weight change: 3.411 kg Exam:  General:  Pt is alert, follows commands appropriately, not in acute distress HEENT: No icterus, No thrush, No neck mass, [place]/AT Cardiovascular: RRR, S1/S2, no rubs, no gallops Respiratory: bibasilar rales.  Diminished BS on right Abdomen: Soft/+BS, non tender, non distended, no guarding Extremities: No edema, No lymphangitis, No petechiae, No rashes, no synovitis   Data Reviewed: I have personally reviewed following labs and imaging studies Basic Metabolic Panel: Recent Labs  Lab 11/25/24 1848 11/26/24 0454 11/27/24 0407  NA 133* 136 137  K 3.8 3.9 3.6  CL 94* 98 101  CO2 21* 26 26  GLUCOSE 106* 149* 164*  BUN 11 15 15   CREATININE 1.01* 1.09* 1.03*  CALCIUM  8.9 8.7* 8.5*  MG  --  2.2 2.4  PHOS  --  4.2  --    Liver Function Tests: Recent Labs  Lab 11/25/24 1848 11/27/24 0407  AST 13* 33  ALT <5 20  ALKPHOS 103 111  BILITOT 0.9 0.2  PROT 7.2 5.8*  ALBUMIN 3.5 2.9*   No results for input(s): LIPASE, AMYLASE in the last 168 hours. No results for input(s): AMMONIA in the last 168 hours. Coagulation Profile: Recent Labs  Lab 11/25/24 1848  INR 1.2   CBC: Recent Labs  Lab 11/25/24 1848 11/26/24 0454 11/27/24 0407  WBC 32.9* 29.1* 35.0*  NEUTROABS 25.3*  --  31.6*  HGB 14.4 12.7 10.9*  HCT 44.6 40.2 33.6*  MCV 83.7 84.3 84.4  PLT 344 307 324   Cardiac Enzymes: No results for input(s): CKTOTAL, CKMB, CKMBINDEX, TROPONINI in the last 168 hours. BNP: Invalid input(s): POCBNP CBG: No results for input(s): GLUCAP in the last 168 hours. HbA1C: No results for input(s): HGBA1C in the last 72  hours. Urine analysis:    Component Value Date/Time   COLORURINE YELLOW 04/13/2023 1036   APPEARANCEUR CLEAR 04/13/2023 1036   LABSPEC 1.015 04/13/2023 1036   PHURINE 5.0 04/13/2023 1036   GLUCOSEU 150 (A) 04/13/2023 1036   HGBUR SMALL (A) 04/13/2023 1036   BILIRUBINUR NEGATIVE 04/13/2023 1036   KETONESUR NEGATIVE 04/13/2023 1036   PROTEINUR NEGATIVE 04/13/2023 1036   NITRITE NEGATIVE 04/13/2023 1036   LEUKOCYTESUR TRACE (A) 04/13/2023 1036   Sepsis Labs: @LABRCNTIP (procalcitonin:4,lacticidven:4) ) Recent Results (from the past 240 hours)  Culture, blood (routine x 2)     Status: None (Preliminary result)   Collection Time: 11/25/24  7:14 PM   Specimen: BLOOD  Result Value Ref Range Status   Specimen Description BLOOD LEFT ANTECUBITAL  Final   Special Requests   Final    BOTTLES DRAWN AEROBIC AND ANAEROBIC Blood Culture results may not be optimal due to an inadequate volume of blood received in culture bottles   Culture   Final    NO GROWTH 2 DAYS Performed at [facility]  [facility], [address], [place], [place] [address]    Report Status PENDING  Incomplete  Culture, blood (routine x 2)     Status: None (Preliminary result)   Collection Time: 11/25/24  7:18 PM   Specimen: BLOOD  Result Value Ref Range Status   Specimen Description BLOOD RIGHT ANTECUBITAL  Final   Special Requests   Final    BOTTLES DRAWN AEROBIC AND ANAEROBIC Blood Culture results may not be optimal due to an inadequate volume of blood received in culture bottles   Culture   Final    NO GROWTH 2 DAYS Performed at [facility], [address], [place], [place] [address]    Report Status PENDING  Incomplete  Resp panel by RT-PCR (RSV, Flu A&B, Covid) Anterior Nasal Swab     Status: None   Collection Time: 11/25/24  7:22 PM   Specimen: Anterior Nasal Swab  Result Value Ref Range Status   SARS Coronavirus 2 by RT PCR NEGATIVE NEGATIVE Final    Comment: (NOTE) SARS-CoV-2 target nucleic acids are NOT  DETECTED.  The SARS-CoV-2 RNA is generally detectable in upper respiratory specimens during the acute phase of infection. The lowest concentration of SARS-CoV-2 viral copies this assay can detect is 138 copies/mL. A negative result does not preclude SARS-Cov-2 infection and should not be used as the sole basis for treatment or other patient management decisions. A negative result may occur with  improper specimen collection/handling, submission of specimen other than nasopharyngeal swab, presence of viral mutation(s) within the areas targeted by this assay, and inadequate number of viral copies(<138 copies/mL). A negative result must be combined with clinical observations, patient history, and epidemiological information. The expected result is Negative.  Fact Sheet for Patients:  bloggercourse.com  Fact Sheet for Healthcare Providers:  seriousbroker.it  This test is no t yet approved or cleared by the [place]  FDA and  has been authorized for detection and/or diagnosis of SARS-CoV-2 by FDA under an Emergency Use Authorization (EUA). This EUA will remain  in effect (meaning this test can be used) for the duration of the COVID-19 declaration under Section 564(b)(1) of the Act, 21 U.S.C.section 360bbb-3(b)(1), unless the authorization is terminated  or revoked sooner.       Influenza A by PCR NEGATIVE NEGATIVE Final   Influenza B by PCR NEGATIVE NEGATIVE Final    Comment: (NOTE) The Xpert Xpress SARS-CoV-2/FLU/RSV plus assay is intended as an aid in the diagnosis of influenza from Nasopharyngeal swab specimens and should not be used as a sole basis for treatment. Nasal washings and aspirates are unacceptable for Xpert Xpress SARS-CoV-2/FLU/RSV testing.  Fact Sheet for Patients: bloggercourse.com  Fact Sheet for Healthcare Providers: seriousbroker.it  This test is not yet  approved or cleared by the [place]  FDA and has been authorized for detection and/or diagnosis of SARS-CoV-2 by FDA under an Emergency Use Authorization (EUA). This EUA will remain in effect (meaning this test can be used) for the duration of the COVID-19 declaration under Section 564(b)(1) of the Act, 21 U.S.C. section 360bbb-3(b)(1), unless the authorization is terminated or revoked.     Resp Syncytial Virus by PCR NEGATIVE NEGATIVE Final    Comment: (NOTE) Fact Sheet for Patients: bloggercourse.com  Fact Sheet for Healthcare Providers: seriousbroker.it  This test is not yet approved or cleared by the [place]  FDA and has been authorized for detection and/or diagnosis of SARS-CoV-2 by FDA under an Emergency Use Authorization (EUA). This EUA will remain in effect (meaning this  test can be used) for the duration of the COVID-19 declaration under Section 564(b)(1) of the Act, 21 U.S.C. section 360bbb-3(b)(1), unless the authorization is terminated or revoked.  Performed at [facility], [address], [place], [place] [address]   MRSA Next Gen by PCR, Nasal     Status: None   Collection Time: 11/26/24  3:40 AM   Specimen: Nasal Mucosa; Nasal Swab  Result Value Ref Range Status   MRSA by PCR Next Gen NOT DETECTED NOT DETECTED Final    Comment: (NOTE) The GeneXpert MRSA Assay (FDA approved for NASAL specimens only), is one component of a comprehensive MRSA colonization surveillance program. It is not intended to diagnose MRSA infection nor to guide or monitor treatment for MRSA infections. Test performance is not FDA approved in patients less than 39 years old. Performed at [facility], [address], [place], [place] [address]   Gram stain     Status: None   Collection Time: 11/26/24 10:00 AM   Specimen: Pleura  Result Value Ref Range Status   Specimen Description PLEURAL  Final   Special Requests PLEURAL  Final    Gram Stain   Final    WBC PRESENT, PREDOMINANTLY PMN NO ORGANISMS SEEN CYTOSPIN SMEAR Performed at [facility], [address], [place], [place] [address]    Report Status 11/26/2024 FINAL  Final  Culture, body fluid w Gram Stain-bottle     Status: None (Preliminary result)   Collection Time: 11/26/24 10:00 AM   Specimen: Pleura  Result Value Ref Range Status   Specimen Description PLEURAL  Final   Special Requests PLEURAL  Final   Culture   Final    NO GROWTH < 24 HOURS Performed at [facility], [address], [place], [place] [address]    Report Status PENDING  Incomplete     Scheduled Meds:  aspirin   81 mg Oral Daily   azithromycin   500 mg Oral Daily   enoxaparin  (LOVENOX ) injection  40 mg Subcutaneous Q24H   ipratropium-albuterol   3 mL Nebulization Q6H   levothyroxine   100 mcg Oral Q0600   midodrine   5 mg Oral TID WC   nicotine   14 mg Transdermal Daily   ranolazine   500 mg Oral BID   rosuvastatin   40 mg Oral Daily   ticagrelor   90 mg Oral BID   Continuous Infusions:  cefTRIAXone  (ROCEPHIN )  IV 2 g (11/27/24 0845)    Procedures/Studies: US  Venous Img Lower Unilateral Left (DVT) Result Date: 11/26/2024 CLINICAL DATA:  LEFT lower extremity swelling and posterior knee pain. EXAM: LEFT LOWER EXTREMITY VENOUS DOPPLER ULTRASOUND TECHNIQUE: Gray-scale sonography with compression, as well as color and duplex ultrasound, were performed to evaluate the deep venous system(s) from the level of the common femoral vein through the popliteal and proximal calf veins. COMPARISON:  None available FINDINGS: VENOUS Normal compressibility of the common femoral, superficial femoral, and popliteal veins, as well as the visualized calf veins. Visualized portions of profunda femoral vein and great saphenous vein unremarkable. No filling defects to suggest DVT on grayscale or color Doppler imaging. Doppler waveforms show normal direction of venous flow, normal respiratory plasticity and response to  augmentation. Limited views of the contralateral common femoral vein are unremarkable. OTHER No Baker's cyst identified. Limitations: none IMPRESSION: No DVT of the left lower extremity. Electronically Signed   By: Aliene Lloyd M.D.   On: 11/26/2024 12:48   DG Chest Portable 1 View Result Date: 11/26/2024 CLINICAL DATA:  Right thoracentesis. EXAM: PORTABLE  CHEST 1 VIEW COMPARISON:  11/25/2024 and CT chest 11/25/2024. FINDINGS: Trachea is midline. Pacemaker and ICD lead tips are stable in position. Heart is enlarged. Minimal improvement in a moderate to large highly loculated pleural effusion at the base of the right hemithorax. No pneumothorax. Compressive atelectasis in the right lung base. Left lung is clear. No pleural fluid. IMPRESSION: 1. Minimal improvement in a high loculated moderate to large right pleural effusion. No pneumothorax. 2. Compressive atelectasis in the right lung base. Electronically Signed   By: Newell Eke M.D.   On: 11/26/2024 10:44   US  THORACENTESIS LEFT ASP PLEURAL SPACE W/IMG GUIDE Result Date: 11/26/2024 INDICATION: 64 year old female. History of CHF, CAD, MI. Endorsing shortness of breath. Found to have a complex right-sided pleural effusion. Request is for therapeutic and diagnostic right-sided thoracentesis. EXAM: ULTRASOUND GUIDED THERAPEUTIC AND DIAGNOSTIC RIGHT-SIDED THORACENTESIS MEDICATIONS: Lidocaine  1% 10 mL COMPLICATIONS: None immediate. PROCEDURE: An ultrasound guided thoracentesis was thoroughly discussed with the patient and questions answered. The benefits, risks, alternatives and complications were also discussed. The patient understands and wishes to proceed with the procedure. Written consent was obtained. Ultrasound was performed to localize and mark an adequate pocket of fluid in the right chest. The area was then prepped and draped in the normal sterile fashion. 1% Lidocaine  was used for local anesthesia. Under ultrasound guidance a 6 Fr Safe-T-Centesis  catheter was introduced. Thoracentesis was performed. The catheter was removed and a dressing applied. FINDINGS: A total of approximately 90 mL of amber colored fluid was removed. Samples were sent to the laboratory as requested by the clinical team. Visualization of the pleural effusion shows loculated phlegmonous collection. IMPRESSION: Successful ultrasound guided therapeutic and diagnostic right-sided thoracentesis yielding 90 mL of amber colored pleural fluid. Performed by Delon Beagle NP Electronically Signed   By: Ester Sides M.D.   On: 11/26/2024 10:14   CT Angio Chest PE W and/or Wo Contrast Result Date: 11/25/2024 CLINICAL DATA:  Shortness of breath EXAM: CT ANGIOGRAPHY CHEST WITH CONTRAST TECHNIQUE: Multidetector CT imaging of the chest was performed using the standard protocol during bolus administration of intravenous contrast. Multiplanar CT image reconstructions and MIPs were obtained to evaluate the vascular anatomy. RADIATION DOSE REDUCTION: This exam was performed according to the departmental dose-optimization program which includes automated exposure control, adjustment of the mA and/or kV according to patient size and/or use of iterative reconstruction technique. CONTRAST:  75mL OMNIPAQUE  IOHEXOL  350 MG/ML SOLN COMPARISON:  Chest x-ray 11/25/2024, chest CT 11/29/2023 FINDINGS: Cardiovascular: Satisfactory opacification of the pulmonary arteries to the segmental level. No evidence of pulmonary embolism. Mild aortic atherosclerosis. Ectatic ascending aorta up to 3.6 cm. Coronary vascular calcification. Cardiomegaly. No pericardial effusion. Left-sided pacing device with multiple intracardiac leads. Mediastinum/Nodes: Patent trachea. No thyroid  mass. Enlarged mediastinal lymph nodes. Superior right paratracheal node measuring 12 mm. AP window lymph nodes measuring up to 10 mm. Precarinal nodes measuring up to 10 mm. Subcarinal nodes measuring up to 16 mm. Right hilar nodes measuring up  to 12 mm. Esophagus unremarkable Lungs/Pleura: Moderate slightly loculated appearing right pleural effusion. Small amount of loculated fluid along the right major fissure. Mild emphysema. 3 mm left apical lung nodule on series 12, image 27. This is unchanged. Partial consolidation in the right lower lobe Upper Abdomen: No acute finding Musculoskeletal: No acute osseous abnormality Review of the MIP images confirms the above findings. IMPRESSION: 1. Negative for acute pulmonary embolus. 2. Moderate slightly loculated appearing right pleural effusion. Partial consolidation in the right lower lobe,  atelectasis versus pneumonia. 3. Cardiomegaly. 4. Mild mediastinal and right hilar adenopathy, nonspecific, but potentially reactive. Attention on chest CT follow-up imaging. 5. Emphysema. Pulmonary emphysema is an independent risk factor for lung cancer. The patient is already enrolled in lung cancer chest CT screening program Aortic Atherosclerosis (ICD10-I70.0) and Emphysema (ICD10-J43.9). Electronically Signed   By: Luke Bun M.D.   On: 11/25/2024 21:47   DG Chest Port 1 View Result Date: 11/25/2024 EXAM: 2 Views XRAY of the Chest 11/25/2024 06:54:17 PM COMPARISON: None available. CLINICAL HISTORY: FINDINGS: LINES, TUBES AND DEVICES: Left triple lead cardiac device in place. LUNGS AND PLEURA: Moderate loculated right pleural effusion. There is patchy atelectasis/airspace disease in the right lower lobe. HEART AND MEDIASTINUM: No acute abnormality of the cardiac and mediastinal silhouettes. BONES AND SOFT TISSUES: No acute osseous abnormality. IMPRESSION: 1. Moderate loculated right pleural effusion. 2. Patchy right lower lobe atelectasis or airspace disease. Electronically signed by: Greig Pique MD 11/25/2024 07:02 PM EST RP Workstation: [id number]   CUP PACEART INCLINIC DEVICE CHECK Result Date: 11/13/2024 in-clinic CRT-D (multi-lead) check. Presenting Rhythm: _AS/VP__ . Routine testing was performed.  Thresholds, sensing, impedance trend were stable. HF diagnostics are stable. No treated arrhythmia. Patient BiV pacing _>99_ % of the time. Estimated longevity __7.8 years__ . Pt enrolled in remote follow-up. EKG opt, programmed BIiVe to LV only (with LB area pacing lead in LV port) see OV note for full discussion RU   Alm Schneider, DO  [facility]  If 7PM-7AM, please contact night-coverage [email or web address] Password [email or web address] 11/27/2024, 10:35 AM   LOS: 2 days   "

## 2024-11-27 NOTE — Progress Notes (Signed)
 Per tele patient had an 18 beat run of vtach, Dr. Evonnie made aware

## 2024-11-27 NOTE — Plan of Care (Signed)
" °  Problem: Education: Goal: Knowledge of General Education information will improve Description: Including pain rating scale, medication(s)/side effects and non-pharmacologic comfort measures Outcome: Progressing   Problem: Clinical Measurements: Goal: Respiratory complications will improve Outcome: Progressing Goal: Cardiovascular complication will be avoided Outcome: Progressing   Problem: Activity: Goal: Risk for activity intolerance will decrease Outcome: Progressing   Problem: Pain Managment: Goal: General experience of comfort will improve and/or be controlled Outcome: Progressing   Problem: Safety: Goal: Ability to remain free from injury will improve Outcome: Progressing   Problem: Skin Integrity: Goal: Risk for impaired skin integrity will decrease Outcome: Progressing   "

## 2024-11-27 NOTE — Plan of Care (Signed)
  Problem: Education: Goal: Knowledge of General Education information will improve Description: Including pain rating scale, medication(s)/side effects and non-pharmacologic comfort measures Outcome: Progressing   Problem: Clinical Measurements: Goal: Ability to maintain clinical measurements within normal limits will improve Outcome: Progressing Goal: Will remain free from infection Outcome: Progressing Goal: Diagnostic test results will improve Outcome: Progressing Goal: Respiratory complications will improve Outcome: Progressing Goal: Cardiovascular complication will be avoided Outcome: Progressing   Problem: Pain Managment: Goal: General experience of comfort will improve and/or be controlled Outcome: Progressing   Problem: Safety: Goal: Ability to remain free from injury will improve Outcome: Progressing

## 2024-11-28 ENCOUNTER — Telehealth (HOSPITAL_COMMUNITY): Payer: Self-pay

## 2024-11-28 ENCOUNTER — Ambulatory Visit (HOSPITAL_COMMUNITY): Admission: RE | Admit: 2024-11-28 | Source: Ambulatory Visit

## 2024-11-28 DIAGNOSIS — J441 Chronic obstructive pulmonary disease with (acute) exacerbation: Principal | ICD-10-CM

## 2024-11-28 DIAGNOSIS — Z720 Tobacco use: Secondary | ICD-10-CM | POA: Diagnosis not present

## 2024-11-28 DIAGNOSIS — I5022 Chronic systolic (congestive) heart failure: Secondary | ICD-10-CM | POA: Diagnosis not present

## 2024-11-28 DIAGNOSIS — J181 Lobar pneumonia, unspecified organism: Secondary | ICD-10-CM | POA: Diagnosis not present

## 2024-11-28 LAB — CBC
HCT: 40.4 % (ref 36.0–46.0)
Hemoglobin: 12.4 g/dL (ref 12.0–15.0)
MCH: 26.7 pg (ref 26.0–34.0)
MCHC: 30.7 g/dL (ref 30.0–36.0)
MCV: 86.9 fL (ref 80.0–100.0)
Platelets: 354 K/uL (ref 150–400)
RBC: 4.65 MIL/uL (ref 3.87–5.11)
RDW: 15.2 % (ref 11.5–15.5)
WBC: 19.3 K/uL — ABNORMAL HIGH (ref 4.0–10.5)
nRBC: 0 % (ref 0.0–0.2)

## 2024-11-28 LAB — MAGNESIUM: Magnesium: 2.5 mg/dL — ABNORMAL HIGH (ref 1.7–2.4)

## 2024-11-28 LAB — BASIC METABOLIC PANEL WITH GFR
Anion gap: 8 (ref 5–15)
BUN: 13 mg/dL (ref 8–23)
CO2: 28 mmol/L (ref 22–32)
Calcium: 8.9 mg/dL (ref 8.9–10.3)
Chloride: 103 mmol/L (ref 98–111)
Creatinine, Ser: 1 mg/dL (ref 0.44–1.00)
GFR, Estimated: >60 mL/min
Glucose, Bld: 73 mg/dL (ref 70–99)
Potassium: 4.9 mmol/L (ref 3.5–5.1)
Sodium: 140 mmol/L (ref 135–145)

## 2024-11-28 MED ORDER — MELATONIN 3 MG PO TABS
6.0000 mg | ORAL_TABLET | Freq: Every day | ORAL | Status: AC
  Administered 2024-11-28 – 2024-12-12 (×15): 6 mg via ORAL
  Filled 2024-11-28 (×15): qty 2

## 2024-11-28 MED ORDER — CALCIUM CARBONATE ANTACID 500 MG PO CHEW
1.0000 | CHEWABLE_TABLET | Freq: Once | ORAL | Status: AC
  Administered 2024-11-28: 200 mg via ORAL
  Filled 2024-11-28: qty 1

## 2024-11-28 MED ORDER — LORAZEPAM 2 MG/ML IJ SOLN
0.5000 mg | Freq: Four times a day (QID) | INTRAMUSCULAR | Status: DC | PRN
  Administered 2024-11-28 – 2024-12-09 (×22): 0.5 mg via INTRAVENOUS
  Filled 2024-11-28 (×24): qty 1

## 2024-11-28 MED ORDER — FUROSEMIDE 20 MG PO TABS
20.0000 mg | ORAL_TABLET | Freq: Once | ORAL | Status: AC
  Administered 2024-11-28: 20 mg via ORAL
  Filled 2024-11-28: qty 1

## 2024-11-28 NOTE — Plan of Care (Signed)
" °  Problem: Education: Goal: Knowledge of General Education information will improve Description: Including pain rating scale, medication(s)/side effects and non-pharmacologic comfort measures Outcome: Progressing   Problem: Health Behavior/Discharge Planning: Goal: Ability to manage health-related needs will improve Outcome: Progressing   Problem: Clinical Measurements: Goal: Ability to maintain clinical measurements within normal limits will improve Outcome: Progressing Goal: Will remain free from infection Outcome: Progressing Goal: Diagnostic test results will improve Outcome: Progressing Goal: Respiratory complications will improve Outcome: Progressing Goal: Cardiovascular complication will be avoided Outcome: Progressing   Problem: Nutrition: Goal: Adequate nutrition will be maintained Outcome: Progressing   Problem: Coping: Goal: Level of anxiety will decrease Outcome: Progressing   Problem: Elimination: Goal: Will not experience complications related to bowel motility Outcome: Progressing Goal: Will not experience complications related to urinary retention Outcome: Progressing   Problem: Pain Managment: Goal: General experience of comfort will improve and/or be controlled Outcome: Progressing   Problem: Safety: Goal: Ability to remain free from injury will improve Outcome: Progressing   Problem: Skin Integrity: Goal: Risk for impaired skin integrity will decrease Outcome: Progressing   Problem: Education: Goal: Knowledge of General Education information will improve Description: Including pain rating scale, medication(s)/side effects and non-pharmacologic comfort measures Outcome: Progressing   Problem: Health Behavior/Discharge Planning: Goal: Ability to manage health-related needs will improve Outcome: Progressing   Problem: Clinical Measurements: Goal: Ability to maintain clinical measurements within normal limits will improve Outcome:  Progressing Goal: Will remain free from infection Outcome: Progressing Goal: Diagnostic test results will improve Outcome: Progressing Goal: Respiratory complications will improve Outcome: Progressing Goal: Cardiovascular complication will be avoided Outcome: Progressing   Problem: Activity: Goal: Risk for activity intolerance will decrease Outcome: Progressing   Problem: Nutrition: Goal: Adequate nutrition will be maintained Outcome: Progressing   Problem: Coping: Goal: Level of anxiety will decrease Outcome: Progressing   Problem: Elimination: Goal: Will not experience complications related to bowel motility Outcome: Progressing Goal: Will not experience complications related to urinary retention Outcome: Progressing   Problem: Pain Managment: Goal: General experience of comfort will improve and/or be controlled Outcome: Progressing   Problem: Safety: Goal: Ability to remain free from injury will improve Outcome: Progressing   Problem: Skin Integrity: Goal: Risk for impaired skin integrity will decrease Outcome: Progressing   "

## 2024-11-28 NOTE — Progress Notes (Signed)
 "          PROGRESS NOTE  Whitney May FMW:[id number] DOB: Apr 03, 1961 DOA: 11/25/2024 PCP: Alston Silvio BROCKS, FNP  Brief History:   64 y.o. 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  She denies any fevers or chills, chest pain, nausea, vomiting, diarrhea.  In the ED, the patient was afebrile and hemodynamically stable albeit with soft blood pressures.  Oxygen  saturation was 92-94% on 2 L.  WBC 32.9, hemoglobin 14.4, platelet 344.  CTA chest was negative for PE but showed a moderate right pleural effusion that was slightly loculated.  There was a partial consolidation of the right lower lobe.  The patient was started on ceftriaxone  and azithromycin .  Thoracocentesis was ordered.  Pleural fluid was turbid and showed 2025 WBC. CBC showed rising WBC count up to 35K.  Due to concern for empyema, possibly requiring invasive intervention, pt was transferred to [facility]   Assessment/Plan: Sepsis secondary to community-acquired pneumonia, POA Moderate-sized loculated right pleural effusion. -Presented with leukocytosis, 33K, tachypnea 28, pneumonia  -Follow peripheral blood cultures x 2. -Lactic acid peaked  1.9 -initially empiric IV antibiotics Rocephin  and azithromycin >>ceftriaxone  + doxy -11/26/24 diagnostic Thoracocentesis--WBC 2025 - follow pleural fluid culture--neg to date - concerned about underlying empyema with increasing WBC up to 35 K--may need chest tube - discussed with pulmonary--will consult--please call them upon arrival to [facility]   Moderate-sized loculated right pleural effusion, POA. --11/26/24 diagnostic  Thoracocentesis -Follow cultures--neg - WBC 2025 (35 N, 54 Eos, 9 mono) - gram stain--no organism - concerned about underlying empyema with rising WBC - pulmonary consult as discussed above   Acute respiratory failure with hypoxia -Presented with tachypnea and oxygen  saturation 90% on room air. -Stable on 2 L nasal cannula -Wean as tolerated for saturation greater than 92%   Hypotension -review of records shows pt has had soft BPs in 90s -continue judicious fluids -added midodrine >>improving   Chronic HFrEF Ischemic cardiomyopathy -Clinically euvolemic/compensated -Echo 07/04/2024 revealed LVEF 25 to 30% with left ventricle global hypokinesis.  Pericardial effusion anterior to the right ventricle. -Daily weights   Coronary artery disease status post PCI with stent placement No reports of chest pain. Resume home aspirin , Brilinta , and Crestor . Resume home statin -continue ranexa    Ischemic cardiomyopathy status post ICD insertion with RV ICD microperforation Follows with cardiology Resume home regimen   Left lower extremity edema and pain behind the knee, POA Left lower extremity ultrasound--neg DVT Pain control.   Hypothyroidism Resume home levothyroxine .   Chronic LBBB Resume home regimen.   Hyperlipidemia Resume home statin   Current tobacco user Counseled on the importance of complete tobacco use. -nicoderm patch -Was previously smoking 1 pack/day, cut down a few days ago to 5 cigarettes/day this past week               Family Communication:   sister 1/22   Consultants:  none   Code Status:  FULL    DVT Prophylaxis:  [place] Lovenox      Procedures: As Listed in Progress Note Above   Antibiotics: Ceftriaxone  1/20>> Azithro 1/20>>1/22 Doxy 1/23>>             Subjective: Pt states sob is a  little better.  Denies cp, n/v/d, abd pain.  Still has sob with exertion, which has been chronic  Objective: Vitals:   11/27/24 2000 11/27/24 2109  11/28/24 0445 11/28/24 0500  BP: (!) 124/58  121/69   Pulse: 66  73   Resp: 18  18   Temp: 97.7 F (36.5 C)  (!) 97.4 F (36.3 C)   TempSrc: Oral  Oral   SpO2: 97% 97% 96%   Weight:    65.1 kg  Height:        Intake/Output Summary (Last 24 hours) at 11/28/2024 9077 Last data filed at 11/28/2024 0855 Gross per 24 hour  Intake 1160.06 ml  Output 2800 ml  Net -1639.94 ml   Weight change: 0 kg Exam:  General:  Pt is alert, follows commands appropriately, not in acute distress HEENT: No icterus, No thrush, No neck mass, [place]/AT Cardiovascular: RRR, S1/S2, no rubs, no gallops Respiratory: bibasilar crackles.  Diminished BS on right Abdomen: Soft/+BS, non tender, non distended, no guarding Extremities: No edema, No lymphangitis, No petechiae, No rashes, no synovitis   Data Reviewed: I have personally reviewed following labs and imaging studies Basic Metabolic Panel: Recent Labs  Lab 11/25/24 1848 11/26/24 0454 11/27/24 0407 11/28/24 0425  NA 133* 136 137 140  K 3.8 3.9 3.6 4.9  CL 94* 98 101 103  CO2 21* 26 26 28   GLUCOSE 106* 149* 164* 73  BUN 11 15 15 13   CREATININE 1.01* 1.09* 1.03* 1.00  CALCIUM  8.9 8.7* 8.5* 8.9  MG  --  2.2 2.4 2.5*  PHOS  --  4.2  --   --    Liver Function Tests: Recent Labs  Lab 11/25/24 1848 11/27/24 0407  AST 13* 33  ALT <5 20  ALKPHOS 103 111  BILITOT 0.9 0.2  PROT 7.2 5.8*  ALBUMIN 3.5 2.9*   No results for input(s): LIPASE, AMYLASE in the last 168 hours. No results for input(s): AMMONIA in the last 168 hours. Coagulation Profile: Recent Labs  Lab 11/25/24 1848  INR 1.2   CBC: Recent Labs  Lab 11/25/24 1848 11/26/24 0454 11/27/24 0407 11/28/24 0425  WBC 32.9* 29.1* 35.0* 19.3*  NEUTROABS 25.3*  --  31.6*  --   HGB 14.4 12.7 10.9* 12.4  HCT 44.6 40.2 33.6* 40.4  MCV 83.7 84.3 84.4 86.9  PLT 344 307 324 354   Cardiac Enzymes: No results for input(s): CKTOTAL, CKMB, CKMBINDEX, TROPONINI in the last 168  hours. BNP: Invalid input(s): POCBNP CBG: No results for input(s): GLUCAP in the last 168 hours. HbA1C: No results for input(s): HGBA1C in the last 72 hours. Urine analysis:    Component Value Date/Time   COLORURINE YELLOW 04/13/2023 1036   APPEARANCEUR CLEAR 04/13/2023 1036   LABSPEC 1.015 04/13/2023 1036   PHURINE 5.0 04/13/2023 1036   GLUCOSEU 150 (A) 04/13/2023 1036   HGBUR SMALL (A) 04/13/2023 1036   BILIRUBINUR NEGATIVE 04/13/2023 1036   KETONESUR NEGATIVE 04/13/2023 1036   PROTEINUR NEGATIVE 04/13/2023 1036   NITRITE NEGATIVE 04/13/2023 1036   LEUKOCYTESUR TRACE (A) 04/13/2023 1036   Sepsis Labs: @LABRCNTIP (procalcitonin:4,lacticidven:4) ) Recent Results (from the past 240 hours)  Culture, blood (routine x 2)     Status: None (Preliminary result)   Collection Time: 11/25/24  7:14 PM   Specimen: BLOOD  Result Value Ref Range Status   Specimen Description BLOOD LEFT ANTECUBITAL  Final   Special Requests   Final    BOTTLES DRAWN AEROBIC AND ANAEROBIC Blood Culture results  may not be optimal due to an inadequate volume of blood received in culture bottles   Culture   Final    NO GROWTH 3 DAYS Performed at [facility], [address] [place], [place] [address]    Report Status PENDING  Incomplete  Culture, blood (routine x 2)     Status: None (Preliminary result)   Collection Time: 11/25/24  7:18 PM   Specimen: BLOOD  Result Value Ref Range Status   Specimen Description BLOOD RIGHT ANTECUBITAL  Final   Special Requests   Final    BOTTLES DRAWN AEROBIC AND ANAEROBIC Blood Culture results may not be optimal due to an inadequate volume of blood received in culture bottles   Culture   Final    NO GROWTH 3 DAYS Performed at [facility], [address], [place], [place] [address]    Report Status PENDING  Incomplete  Resp panel by RT-PCR (RSV, Flu A&B, Covid) Anterior Nasal Swab     Status: None   Collection Time: 11/25/24  7:22 PM   Specimen: Anterior Nasal  Swab  Result Value Ref Range Status   SARS Coronavirus 2 by RT PCR NEGATIVE NEGATIVE Final    Comment: (NOTE) SARS-CoV-2 target nucleic acids are NOT DETECTED.  The SARS-CoV-2 RNA is generally detectable in upper respiratory specimens during the acute phase of infection. The lowest concentration of SARS-CoV-2 viral copies this assay can detect is 138 copies/mL. A negative result does not preclude SARS-Cov-2 infection and should not be used as the sole basis for treatment or other patient management decisions. A negative result may occur with  improper specimen collection/handling, submission of specimen other than nasopharyngeal swab, presence of viral mutation(s) within the areas targeted by this assay, and inadequate number of viral copies(<138 copies/mL). A negative result must be combined with clinical observations, patient history, and epidemiological information. The expected result is Negative.  Fact Sheet for Patients:  bloggercourse.com  Fact Sheet for Healthcare Providers:  seriousbroker.it  This test is no t yet approved or cleared by the [place]  FDA and  has been authorized for detection and/or diagnosis of SARS-CoV-2 by FDA under an Emergency Use Authorization (EUA). This EUA will remain  in effect (meaning this test can be used) for the duration of the COVID-19 declaration under Section 564(b)(1) of the Act, 21 U.S.C.section 360bbb-3(b)(1), unless the authorization is terminated  or revoked sooner.       Influenza A by PCR NEGATIVE NEGATIVE Final   Influenza B by PCR NEGATIVE NEGATIVE Final    Comment: (NOTE) The Xpert Xpress SARS-CoV-2/FLU/RSV plus assay is intended as an aid in the diagnosis of influenza from Nasopharyngeal swab specimens and should not be used as a sole basis for treatment. Nasal washings and aspirates are unacceptable for Xpert Xpress SARS-CoV-2/FLU/RSV testing.  Fact Sheet for  Patients: bloggercourse.com  Fact Sheet for Healthcare Providers: seriousbroker.it  This test is not yet approved or cleared by the [place]  FDA and has been authorized for detection and/or diagnosis of SARS-CoV-2 by FDA under an Emergency Use Authorization (EUA). This EUA will remain in effect (meaning this test can be used) for the duration of the COVID-19 declaration under Section 564(b)(1) of the Act, 21 U.S.C. section 360bbb-3(b)(1), unless the authorization is terminated or revoked.     Resp Syncytial Virus by PCR NEGATIVE NEGATIVE Final    Comment: (NOTE) Fact Sheet for Patients: bloggercourse.com  Fact Sheet for Healthcare Providers: seriousbroker.it  This test is not yet approved or cleared  by the [place]  FDA and has been authorized for detection and/or diagnosis of SARS-CoV-2 by FDA under an Emergency Use Authorization (EUA). This EUA will remain in effect (meaning this test can be used) for the duration of the COVID-19 declaration under Section 564(b)(1) of the Act, 21 U.S.C. section 360bbb-3(b)(1), unless the authorization is terminated or revoked.  Performed at [facility], [address] [place], [place] [address]   MRSA Next Gen by PCR, Nasal     Status: None   Collection Time: 11/26/24  3:40 AM   Specimen: Nasal Mucosa; Nasal Swab  Result Value Ref Range Status   MRSA by PCR Next Gen NOT DETECTED NOT DETECTED Final    Comment: (NOTE) The GeneXpert MRSA Assay (FDA approved for NASAL specimens only), is one component of a comprehensive MRSA colonization surveillance program. It is not intended to diagnose MRSA infection nor to guide or monitor treatment for MRSA infections. Test performance is not FDA approved in patients less than 74 years old. Performed at [facility], [address] [place], [place] [address]   Gram stain     Status: None    Collection Time: 11/26/24 10:00 AM   Specimen: Pleura  Result Value Ref Range Status   Specimen Description PLEURAL  Final   Special Requests PLEURAL  Final   Gram Stain   Final    WBC PRESENT, PREDOMINANTLY PMN NO ORGANISMS SEEN CYTOSPIN SMEAR Performed at [facility], [address] [place], [place] [address]    Report Status 11/26/2024 FINAL  Final  Culture, body fluid w Gram Stain-bottle     Status: None (Preliminary result)   Collection Time: 11/26/24 10:00 AM   Specimen: Pleura  Result Value Ref Range Status   Specimen Description PLEURAL  Final   Special Requests PLEURAL  Final   Culture   Final    NO GROWTH 2 DAYS Performed at [facility], [address] [place], [place] [address]    Report Status PENDING  Incomplete     Scheduled Meds:  aspirin   81 mg Oral Daily   calcium  carbonate  1 tablet Oral Daily   doxycycline   100 mg Oral Q12H   enoxaparin  (LOVENOX ) injection  40 mg Subcutaneous Q24H   ipratropium-albuterol   3 mL Nebulization TID   levothyroxine   100 mcg Oral Q0600   midodrine   5 mg Oral TID WC   nicotine   14 mg Transdermal Daily   ranolazine   500 mg Oral BID   rosuvastatin   40 mg Oral Daily   ticagrelor   90 mg Oral BID   Continuous Infusions:  cefTRIAXone  (ROCEPHIN )  IV Stopped (11/27/24 0915)    Procedures/Studies: US  Venous Img Lower Unilateral Left (DVT) Result Date: 11/26/2024 CLINICAL DATA:  LEFT lower extremity swelling and posterior knee pain. EXAM: LEFT LOWER EXTREMITY VENOUS DOPPLER ULTRASOUND TECHNIQUE: Gray-scale sonography with compression, as well as color and duplex ultrasound, were performed to evaluate the deep venous system(s) from the level of the common femoral vein through the popliteal and proximal calf veins. COMPARISON:  None available FINDINGS: VENOUS Normal compressibility of the common femoral, superficial femoral, and popliteal veins, as well as the visualized calf veins. Visualized portions of profunda femoral vein and great  saphenous vein unremarkable. No filling defects to suggest DVT on grayscale or color Doppler imaging. Doppler waveforms show normal direction of venous flow, normal respiratory plasticity and response to augmentation. Limited views of the contralateral common femoral vein are unremarkable. OTHER No Baker's cyst identified. Limitations: none IMPRESSION:  No DVT of the left lower extremity. Electronically Signed   By: Aliene Lloyd M.D.   On: 11/26/2024 12:48   DG Chest Portable 1 View Result Date: 11/26/2024 CLINICAL DATA:  Right thoracentesis. EXAM: PORTABLE CHEST 1 VIEW COMPARISON:  11/25/2024 and CT chest 11/25/2024. FINDINGS: Trachea is midline. Pacemaker and ICD lead tips are stable in position. Heart is enlarged. Minimal improvement in a moderate to large highly loculated pleural effusion at the base of the right hemithorax. No pneumothorax. Compressive atelectasis in the right lung base. Left lung is clear. No pleural fluid. IMPRESSION: 1. Minimal improvement in a high loculated moderate to large right pleural effusion. No pneumothorax. 2. Compressive atelectasis in the right lung base. Electronically Signed   By: Newell Eke M.D.   On: 11/26/2024 10:44   US  THORACENTESIS LEFT ASP PLEURAL SPACE W/IMG GUIDE Result Date: 11/26/2024 INDICATION: 64 year old female. History of CHF, CAD, MI. Endorsing shortness of breath. Found to have a complex right-sided pleural effusion. Request is for therapeutic and diagnostic right-sided thoracentesis. EXAM: ULTRASOUND GUIDED THERAPEUTIC AND DIAGNOSTIC RIGHT-SIDED THORACENTESIS MEDICATIONS: Lidocaine  1% 10 mL COMPLICATIONS: None immediate. PROCEDURE: An ultrasound guided thoracentesis was thoroughly discussed with the patient and questions answered. The benefits, risks, alternatives and complications were also discussed. The patient understands and wishes to proceed with the procedure. Written consent was obtained. Ultrasound was performed to localize and mark an  adequate pocket of fluid in the right chest. The area was then prepped and draped in the normal sterile fashion. 1% Lidocaine  was used for local anesthesia. Under ultrasound guidance a 6 Fr Safe-T-Centesis catheter was introduced. Thoracentesis was performed. The catheter was removed and a dressing applied. FINDINGS: A total of approximately 90 mL of amber colored fluid was removed. Samples were sent to the laboratory as requested by the clinical team. Visualization of the pleural effusion shows loculated phlegmonous collection. IMPRESSION: Successful ultrasound guided therapeutic and diagnostic right-sided thoracentesis yielding 90 mL of amber colored pleural fluid. Performed by Delon Beagle NP Electronically Signed   By: Ester Sides M.D.   On: 11/26/2024 10:14   CT Angio Chest PE W and/or Wo Contrast Result Date: 11/25/2024 CLINICAL DATA:  Shortness of breath EXAM: CT ANGIOGRAPHY CHEST WITH CONTRAST TECHNIQUE: Multidetector CT imaging of the chest was performed using the standard protocol during bolus administration of intravenous contrast. Multiplanar CT image reconstructions and MIPs were obtained to evaluate the vascular anatomy. RADIATION DOSE REDUCTION: This exam was performed according to the departmental dose-optimization program which includes automated exposure control, adjustment of the mA and/or kV according to patient size and/or use of iterative reconstruction technique. CONTRAST:  75mL OMNIPAQUE  IOHEXOL  350 MG/ML SOLN COMPARISON:  Chest x-ray 11/25/2024, chest CT 11/29/2023 FINDINGS: Cardiovascular: Satisfactory opacification of the pulmonary arteries to the segmental level. No evidence of pulmonary embolism. Mild aortic atherosclerosis. Ectatic ascending aorta up to 3.6 cm. Coronary vascular calcification. Cardiomegaly. No pericardial effusion. Left-sided pacing device with multiple intracardiac leads. Mediastinum/Nodes: Patent trachea. No thyroid  mass. Enlarged mediastinal lymph nodes.  Superior right paratracheal node measuring 12 mm. AP window lymph nodes measuring up to 10 mm. Precarinal nodes measuring up to 10 mm. Subcarinal nodes measuring up to 16 mm. Right hilar nodes measuring up to 12 mm. Esophagus unremarkable Lungs/Pleura: Moderate slightly loculated appearing right pleural effusion. Small amount of loculated fluid along the right major fissure. Mild emphysema. 3 mm left apical lung nodule on series 12, image 27. This is unchanged. Partial consolidation in the right lower lobe Upper Abdomen: No  acute finding Musculoskeletal: No acute osseous abnormality Review of the MIP images confirms the above findings. IMPRESSION: 1. Negative for acute pulmonary embolus. 2. Moderate slightly loculated appearing right pleural effusion. Partial consolidation in the right lower lobe, atelectasis versus pneumonia. 3. Cardiomegaly. 4. Mild mediastinal and right hilar adenopathy, nonspecific, but potentially reactive. Attention on chest CT follow-up imaging. 5. Emphysema. Pulmonary emphysema is an independent risk factor for lung cancer. The patient is already enrolled in lung cancer chest CT screening program Aortic Atherosclerosis (ICD10-I70.0) and Emphysema (ICD10-J43.9). Electronically Signed   By: Luke Bun M.D.   On: 11/25/2024 21:47   DG Chest Port 1 View Result Date: 11/25/2024 EXAM: 2 Views XRAY of the Chest 11/25/2024 06:54:17 PM COMPARISON: None available. CLINICAL HISTORY: FINDINGS: LINES, TUBES AND DEVICES: Left triple lead cardiac device in place. LUNGS AND PLEURA: Moderate loculated right pleural effusion. There is patchy atelectasis/airspace disease in the right lower lobe. HEART AND MEDIASTINUM: No acute abnormality of the cardiac and mediastinal silhouettes. BONES AND SOFT TISSUES: No acute osseous abnormality. IMPRESSION: 1. Moderate loculated right pleural effusion. 2. Patchy right lower lobe atelectasis or airspace disease. Electronically signed by: Greig Pique MD 11/25/2024  07:02 PM EST RP Workstation: [id number]   CUP PACEART INCLINIC DEVICE CHECK Result Date: 11/13/2024 in-clinic CRT-D (multi-lead) check. Presenting Rhythm: _AS/VP__ . Routine testing was performed. Thresholds, sensing, impedance trend were stable. HF diagnostics are stable. No treated arrhythmia. Patient BiV pacing _>99_ % of the time. Estimated longevity __7.8 years__ . Pt enrolled in remote follow-up. EKG opt, programmed BIiVe to LV only (with LB area pacing lead in LV port) see OV note for full discussion RU   Alm Schneider, DO  [facility]  If 7PM-7AM, please contact night-coverage [email or web address] Password [email or web address] 11/28/2024, 9:22 AM   LOS: 3 days   "

## 2024-11-28 NOTE — Plan of Care (Signed)
  Problem: Education: Goal: Knowledge of General Education information will improve Description: Including pain rating scale, medication(s)/side effects and non-pharmacologic comfort measures Outcome: Progressing   Problem: Clinical Measurements: Goal: Ability to maintain clinical measurements within normal limits will improve Outcome: Progressing Goal: Respiratory complications will improve Outcome: Progressing Goal: Cardiovascular complication will be avoided Outcome: Progressing   Problem: Coping: Goal: Level of anxiety will decrease Outcome: Progressing   Problem: Pain Managment: Goal: General experience of comfort will improve and/or be controlled Outcome: Progressing   Problem: Safety: Goal: Ability to remain free from injury will improve Outcome: Progressing

## 2024-11-28 NOTE — Plan of Care (Signed)

## 2024-11-28 NOTE — Telephone Encounter (Signed)
 Called to confirm/remind patient of their appointment at the [facility] on 11/28/24.   Appointment:   [] Confirmed  [] Left mess   [] No answer/No voice mail  [] VM Full/unable to leave message  [] Phone not in service  Patient reminded to bring all medications and/or complete list.  Confirmed patient has transportation. Gave directions, instructed to utilize valet parking.    Patient hospitalized appointment canceled she will callback to reschedule

## 2024-11-28 NOTE — Progress Notes (Signed)
 [facility] night cross cover note:   I have added prn IV Ativan  per the patient's report of anxiety.    Eva Pore, DO Hospitalist

## 2024-11-29 ENCOUNTER — Inpatient Hospital Stay (HOSPITAL_COMMUNITY)

## 2024-11-29 DIAGNOSIS — J189 Pneumonia, unspecified organism: Secondary | ICD-10-CM | POA: Diagnosis not present

## 2024-11-29 DIAGNOSIS — D721 Eosinophilia, unspecified: Secondary | ICD-10-CM | POA: Diagnosis not present

## 2024-11-29 DIAGNOSIS — Z720 Tobacco use: Secondary | ICD-10-CM | POA: Diagnosis not present

## 2024-11-29 DIAGNOSIS — J90 Pleural effusion, not elsewhere classified: Secondary | ICD-10-CM | POA: Diagnosis not present

## 2024-11-29 LAB — CBC WITH DIFFERENTIAL/PLATELET
Abs Immature Granulocytes: 0.57 10*3/uL — ABNORMAL HIGH (ref 0.00–0.07)
Basophils Absolute: 0.1 10*3/uL (ref 0.0–0.1)
Basophils Relative: 1 %
Eosinophils Absolute: 1.2 10*3/uL — ABNORMAL HIGH (ref 0.0–0.5)
Eosinophils Relative: 6 %
HCT: 42.2 % (ref 36.0–46.0)
Hemoglobin: 13.5 g/dL (ref 12.0–15.0)
Immature Granulocytes: 3 %
Lymphocytes Relative: 11 %
Lymphs Abs: 2.2 10*3/uL (ref 0.7–4.0)
MCH: 27.1 pg (ref 26.0–34.0)
MCHC: 32 g/dL (ref 30.0–36.0)
MCV: 84.6 fL (ref 80.0–100.0)
Monocytes Absolute: 2.5 10*3/uL — ABNORMAL HIGH (ref 0.1–1.0)
Monocytes Relative: 12 %
Neutro Abs: 14 10*3/uL — ABNORMAL HIGH (ref 1.7–7.7)
Neutrophils Relative %: 67 %
Platelets: 341 10*3/uL (ref 150–400)
RBC: 4.99 MIL/uL (ref 3.87–5.11)
RDW: 15 % (ref 11.5–15.5)
WBC: 20.6 10*3/uL — ABNORMAL HIGH (ref 4.0–10.5)
nRBC: 0 % (ref 0.0–0.2)

## 2024-11-29 LAB — BASIC METABOLIC PANEL WITH GFR
Anion gap: 9 (ref 5–15)
BUN: 10 mg/dL (ref 8–23)
CO2: 27 mmol/L (ref 22–32)
Calcium: 8.7 mg/dL — ABNORMAL LOW (ref 8.9–10.3)
Chloride: 99 mmol/L (ref 98–111)
Creatinine, Ser: 0.84 mg/dL (ref 0.44–1.00)
GFR, Estimated: >60 mL/min
Glucose, Bld: 89 mg/dL (ref 70–99)
Potassium: 4.2 mmol/L (ref 3.5–5.1)
Sodium: 135 mmol/L (ref 135–145)

## 2024-11-29 LAB — MAGNESIUM: Magnesium: 1.8 mg/dL (ref 1.7–2.4)

## 2024-11-29 MED ORDER — GUAIFENESIN ER 600 MG PO TB12
600.0000 mg | ORAL_TABLET | Freq: Two times a day (BID) | ORAL | Status: AC
  Administered 2024-11-29 – 2024-12-12 (×28): 600 mg via ORAL
  Filled 2024-11-29 (×28): qty 1

## 2024-11-29 MED ORDER — GUAIFENESIN-DM 100-10 MG/5ML PO SYRP: 10.0000 mL | ORAL_SOLUTION | Freq: Four times a day (QID) | ORAL | Status: AC | PRN

## 2024-11-29 NOTE — Consult Note (Signed)
 "  NAME:  Whitney May, MRN:  [id number], DOB:  July 25, 1961, LOS: 4 ADMISSION DATE:  11/25/2024, CONSULTATION DATE:  11/29/24 REFERRING MD:  Tat, CHIEF COMPLAINT:  abnormal chest imaging    History of Present Illness:  64 yo F  PMH tobacco use, CAD with prior PCI on DAPT (ASA brilinta ), HFrEF, ICM, s/p ICD, who was admitted to [facility] at [facility] after presenting 1/21 w SOB/cough. Started tx for PNA and underwent thora 1/21 with IR for R pleural effusion.  Fluid was loculated and incompletely drained so transfer was requested to [facility] for chest tube placement.   Pt arrived days 1/24    Pertinent  Medical History  CAD ICM HFrEF S/p ICD   Significant Hospital Events: Including procedures, antibiotic start and stop dates in addition to other pertinent events   1/20 admit to [facility]  1/21 thora w IR 1/22 txf request to [facility] for chest tube 1/24 arrives to [facility].   Interim History / Subjective:  Arrived to [facility]   Objective    Blood pressure 122/78, pulse 91, temperature 97.7 F (36.5 C), resp. rate 18, height 5' 2 (1.575 m), weight 63.1 kg, SpO2 90%.        Intake/Output Summary (Last 24 hours) at 11/29/2024 0857 Last data filed at 11/28/2024 1804 Gross per 24 hour  Intake 720 ml  Output 500 ml  Net 220 ml   Filed Weights   11/27/24 0342 11/28/24 0500 11/29/24 0500  Weight: 65.1 kg 65.1 kg 63.1 kg    Examination: General: chronically and acutely ill F  HENT: NCAT  Lungs: slightly shallow respirations. Normal rate, no accessory muscle use. L>R wheeze   Cardiovascular: cap refill < 3 sec  Abdomen: soft  Extremities: no obvious acute joint deformity  Neuro: aaox4 GU: defer  Resolved problem list   Assessment and Plan   Loculated R pleural effusion Tobacco use -labs for pleural fluid from [facility] are incomplete for complete eval, but does not appear to be consistent with empyema. Gram stain/cs with no organisms. Fluid description would also support likely not empyema. Eos are high  -on  repeat CXR 1/24 size is reduced. She does have a lateral pocked on US .  -takes DAPT last given 1/24. Has been paused in past short-term for procedures. Last cath 04/2023  P -discussed risks/benefits of holding DAPT vs bleeding risk w pt and my attending -- plan to hold DAPT for chest tube in coming days  -do think that she will likely require tpa/dornase  -cont abx -when chest tube placed send pleural fluid  -cont pulm hygiene, cont bronchodilators -nicotine  patch, encourage cessation   Labs   CBC: Recent Labs  Lab 11/25/24 1848 11/26/24 0454 11/27/24 0407 11/28/24 0425  WBC 32.9* 29.1* 35.0* 19.3*  NEUTROABS 25.3*  --  31.6*  --   HGB 14.4 12.7 10.9* 12.4  HCT 44.6 40.2 33.6* 40.4  MCV 83.7 84.3 84.4 86.9  PLT 344 307 324 354    Basic Metabolic Panel: Recent Labs  Lab 11/25/24 1848 11/26/24 0454 11/27/24 0407 11/28/24 0425  NA 133* 136 137 140  K 3.8 3.9 3.6 4.9  CL 94* 98 101 103  CO2 21* 26 26 28   GLUCOSE 106* 149* 164* 73  BUN 11 15 15 13   CREATININE 1.01* 1.09* 1.03* 1.00  CALCIUM  8.9 8.7* 8.5* 8.9  MG  --  2.2 2.4 2.5*  PHOS  --  4.2  --   --    GFR: Estimated Creatinine  Clearance: 51.6 mL/min (by C-G formula based on SCr of 1 mg/dL). Recent Labs  Lab 11/25/24 1848 11/25/24 1914 11/25/24 2032 11/26/24 0454 11/27/24 0407 11/28/24 0425  PROCALCITON  --   --   --   --  0.26  --   WBC 32.9*  --   --  29.1* 35.0* 19.3*  LATICACIDVEN  --  1.2 1.9  --   --   --     Liver Function Tests: Recent Labs  Lab 11/25/24 1848 11/27/24 0407  AST 13* 33  ALT <5 20  ALKPHOS 103 111  BILITOT 0.9 0.2  PROT 7.2 5.8*  ALBUMIN 3.5 2.9*   No results for input(s): LIPASE, AMYLASE in the last 168 hours. No results for input(s): AMMONIA in the last 168 hours.  ABG    Component Value Date/Time   TCO2 24 09/18/2022 1507     Coagulation Profile: Recent Labs  Lab 11/25/24 1848  INR 1.2    Cardiac Enzymes: No results for input(s): CKTOTAL, CKMB,  CKMBINDEX, TROPONINI in the last 168 hours.  HbA1C: Hgb A1c MFr Bld  Date/Time Value Ref Range Status  09/18/2022 03:13 PM 5.3 4.8 - 5.6 % Final    Comment:    (NOTE) Pre diabetes:          5.7%-6.4%  Diabetes:              >6.4%  Glycemic control for   <7.0% adults with diabetes   08/02/2018 05:33 PM 5.5 4.8 - 5.6 % Final    Comment:    (NOTE) Pre diabetes:          5.7%-6.4% Diabetes:              >6.4% Glycemic control for   <7.0% adults with diabetes     CBG: No results for input(s): GLUCAP in the last 168 hours.  Review of Systems:   Review of Systems  Constitutional: Negative.   Respiratory:  Positive for cough, sputum production, shortness of breath and wheezing.   Cardiovascular: Negative.   Gastrointestinal: Negative.   Genitourinary: Negative.   Musculoskeletal:  Positive for joint pain.  Skin: Negative.   Psychiatric/Behavioral:  The patient is nervous/anxious and has insomnia.      Past Medical History:  She,  has a past medical history of Alcohol  use, CAD (coronary artery disease), Chest pain, Hyperlipidemia, mixed, Hypotension, Hypothyroidism, Ischemic cardiomyopathy, LBBB (left bundle branch block), MI (myocardial infarction) (HCC), and Tobacco abuse.   Surgical History:   Past Surgical History:  Procedure Laterality Date   BIV ICD INSERTION CRT-D N/A 05/16/2024   Procedure: BIV ICD INSERTION CRT-D;  Surgeon: Mealor, Eulas BRAVO, MD;  Location: [facility] INVASIVE CV LAB;  Service: Cardiovascular;  Laterality: N/A;   CESAREAN SECTION     CORONARY STENT INTERVENTION N/A 03/07/2019   Procedure: CORONARY STENT INTERVENTION;  Surgeon: Wonda Sharper, MD;  Location: [facility] INVASIVE CV LAB;  Service: Cardiovascular;  Laterality: N/A;   CORONARY/GRAFT ACUTE MI REVASCULARIZATION N/A 08/02/2018   Procedure: Coronary/Graft Acute MI Revascularization;  Surgeon: Anner Alm ORN, MD;  Location: [facility] INVASIVE CV LAB;  Service: Cardiovascular;  Laterality: N/A;    CORONARY/GRAFT ACUTE MI REVASCULARIZATION N/A 09/18/2022   Procedure: Coronary/Graft Acute MI Revascularization;  Surgeon: Court Dorn PARAS, MD;  Location: [facility] INVASIVE CV LAB;  Service: Cardiovascular;  Laterality: N/A;   ICD IMPLANT Left    LEAD REVISION/REPAIR N/A 07/03/2024   Procedure: LEAD REVISION/REPAIR;  Surgeon: Inocencio Soyla Lunger, MD;  Location: [facility] INVASIVE  CV LAB;  Service: Cardiovascular;  Laterality: N/A;   LEFT HEART CATH AND CORONARY ANGIOGRAPHY N/A 08/02/2018   Procedure: LEFT HEART CATH AND CORONARY ANGIOGRAPHY;  Surgeon: Anner Alm ORN, MD;  Location: [facility] INVASIVE CV LAB;  Service: Cardiovascular;  Laterality: N/A;   LEFT HEART CATH AND CORONARY ANGIOGRAPHY N/A 03/07/2019   Procedure: LEFT HEART CATH AND CORONARY ANGIOGRAPHY;  Surgeon: Cherrie Toribio SAUNDERS, MD;  Location: [facility] INVASIVE CV LAB;  Service: Cardiovascular;  Laterality: N/A;   LEFT HEART CATH AND CORONARY ANGIOGRAPHY N/A 09/18/2022   Procedure: LEFT HEART CATH AND CORONARY ANGIOGRAPHY;  Surgeon: Court Dorn PARAS, MD;  Location: [facility] INVASIVE CV LAB;  Service: Cardiovascular;  Laterality: N/A;   LEFT HEART CATH AND CORONARY ANGIOGRAPHY N/A 04/16/2023   Procedure: LEFT HEART CATH AND CORONARY ANGIOGRAPHY;  Surgeon: Anner Alm ORN, MD;  Location: [facility] INVASIVE CV LAB;  Service: Cardiovascular;  Laterality: N/A;     Social History:   reports that she has been smoking cigarettes. She started smoking about 28 years ago. She has a 22 pack-year smoking history. She has never used smokeless tobacco. She reports current alcohol  use of about 8.0 standard drinks of alcohol  per week. She reports that she does not use drugs.   Family History:  Her family history includes Coronary artery disease in her maternal grandmother and mother; Diabetes in her father; Hypertension in her father.   Allergies Allergies[1]   Home Medications  Prior to Admission medications  Medication Sig Start Date End Date Taking? Authorizing Provider   acetaminophen  (TYLENOL ) 500 MG tablet Take 1,000 mg by mouth every 6 (six) hours as needed for moderate pain (pain score 4-6).   Yes [provider]  aspirin  81 MG chewable tablet Chew 1 tablet (81 mg total) by mouth daily. 06/08/23  Yes Milford, Harlene HERO, FNP  bisoprolol  (ZEBETA ) 5 MG tablet TAKE 1/2 TABLET BY MOUTH ONCE DAILY 10/16/24  Yes McLean, Dalton S, MD  calcium  carbonate (TUMS - DOSED IN MG ELEMENTAL CALCIUM ) 500 MG chewable tablet Chew 1 tablet by mouth daily as needed for indigestion or heartburn.   Yes [provider]  empagliflozin  (JARDIANCE ) 10 MG TABS tablet TAKE 1 TABLET BY MOUTH DAILY BEFORE BREAKFAST 11/14/24  Yes Rolan Ezra RAMAN, MD  FT NICOTINE  21 MG/24HR patch Place 21 mg onto the skin daily. 10/14/24  Yes [provider]  furosemide  (LASIX ) 20 MG tablet Take 1 tablet (20 mg total) by mouth daily as needed for weight gain of 3 lbs in 24 hours or 5 lbs in a week 04/02/24 11/26/24 Yes Lee, Jordan, NP  isosorbide  mononitrate (IMDUR ) 30 MG 24 hr tablet TAKE 1 TABLET BY MOUTH DAILY 10/16/24  Yes McLean, Dalton S, MD  levothyroxine  (SYNTHROID ) 75 MCG tablet Take 75 mcg by mouth every morning. 11/17/24  Yes [provider]  losartan  (COZAAR ) 25 MG tablet TAKE 1/2 TABLET BY MOUTH DAILY 09/04/24  Yes McLean, Dalton S, MD  nitroGLYCERIN  (NITROSTAT ) 0.4 MG SL tablet Place 1 tablet (0.4 mg total) under the tongue every 5 (five) minutes x 3 doses as needed for chest pain. 07/05/23  Yes Rolan Ezra RAMAN, MD  ranolazine  (RANEXA ) 500 MG 12 hr tablet Take 1 tablet (500 mg total) by mouth 2 (two) times daily. PLEASE SCHEDULE APPOINTMENT FOR MORE REFILLS [phone] OPTION 2 11/14/24  Yes Rolan Ezra RAMAN, MD  rosuvastatin  (CRESTOR ) 40 MG tablet Take 1 tablet (40 mg total) by mouth daily. PLEASE SCHEDULE APPOINTMENT FOR MORE REFILLS [phone] OPTION 2  10/27/24  Yes Rolan Ezra RAMAN, MD  ticagrelor  (BRILINTA ) 90 MG TABS tablet Take 1 tablet (90 mg total) by mouth 2  (two) times daily. PLEASE SCHEDULE APPOINTMENT FOR MORE REFILLS [phone] OPTION 2 09/08/24  Yes Rolan Ezra RAMAN, MD  Vitamin D , Ergocalciferol , (DRISDOL) 1.25 MG (50000 UNIT) CAPS capsule Take 50,000 Units by mouth once a week. 10/13/24  Yes [provider]     Critical care time: n/a       Ronnald Gave MSN, AGACNP-BC [facility] Amion for pager 11/29/2024, 12:46 PM           [1]  Allergies Allergen Reactions   Penicillins Nausea And Vomiting    Fever    "

## 2024-11-29 NOTE — Progress Notes (Signed)
 [facility] Progress Note  Patient: Whitney May    FMW:[id number]  DOA: 11/25/2024     Date of Service: the patient was seen and examined on 11/29/2024  Chief Complaint  Patient presents with   Shortness of Breath   Brief hospital course: 64 y.o. 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  She denies any fevers or chills, chest pain, nausea, vomiting, diarrhea.   In the ED, the patient was afebrile and hemodynamically stable albeit with soft blood pressures.  Oxygen  saturation was 92-94% on 2 L.  WBC 32.9, hemoglobin 14.4, platelet 344.  CTA chest was negative for PE but showed a moderate right pleural effusion that was slightly loculated.  There was a partial consolidation of the right lower lobe.  The patient was started on ceftriaxone  and azithromycin .  Thoracocentesis was ordered.   Pleural fluid was turbid and showed 2025 WBC. CBC showed rising WBC count up to 35K.  Due to concern for empyema, possibly requiring invasive intervention, pt was transferred to [facility]    Assessment and Plan: Sepsis secondary to community-acquired pneumonia, POA Moderate-sized loculated right pleural effusion. -Presented with leukocytosis, 33K, tachypnea 28, pneumonia  -Follow peripheral blood cultures x 2. -Lactic acid peaked  1.9 -initially empiric IV antibiotics Rocephin  and azithromycin >>ceftriaxone  + doxy -11/26/24 diagnostic Thoracocentesis--WBC 2025 - follow pleural fluid culture--neg to date - concerned about underlying empyema with increasing WBC up to 35 K--may need chest tube 1/24 Mucinex  600 mg p.o. twice daily,  Robitussin DM as needed D/w pulmonary, patient may need chest tube but she is on DAPT, need to get in touch with cardiology to stop Brilinta     Moderate-sized loculated right pleural effusion, POA. --11/26/24 diagnostic Thoracocentesis -Follow cultures--neg - WBC 2025 (35 N, 54 Eos, 9 mono) - gram stain--no organism - concerned about underlying empyema with rising WBC - pulmonary consult as discussed above   Acute respiratory failure with hypoxia -Presented with tachypnea and oxygen  saturation 90% on room air. -Stable on 2 L nasal cannula -Wean as tolerated for saturation greater than 92%   Hypotension -review of records shows pt has had soft BPs in 90s -continue judicious fluids -added midodrine >>improving   Chronic HFrEF Ischemic cardiomyopathy -Clinically euvolemic/compensated -Echo 07/04/2024 revealed LVEF 25 to 30% with left ventricle global hypokinesis.  Pericardial effusion anterior to the right ventricle. -Daily weights   Coronary artery disease status post PCI with stent placement No reports of chest pain. Resume home aspirin , Brilinta , and Crestor . Resume home statin -continue ranexa    Ischemic cardiomyopathy status post ICD insertion with RV ICD microperforation Follows with cardiology Resume home regimen   Left lower extremity edema and pain behind the knee, POA Left lower extremity ultrasound--neg DVT Pain control.   Hypothyroidism Resume home levothyroxine .   Chronic LBBB Resume home regimen.   Hyperlipidemia Resume home statin   Current tobacco user Counseled on the importance of complete tobacco use. -nicoderm patch -Was previously smoking 1 pack/day, cut down a few days ago to 5 cigarettes/day this past week     Body mass index is 25.44 kg/m.  Interventions:  Diet: Heart healthy diet DVT Prophylaxis: Subcutaneous Lovenox    Advance goals of care discussion: Full code  Family  Communication: family was not present at bedside, at the time  of interview.  The pt provided permission to discuss medical plan with the family. Opportunity was given to ask question and all questions were answered satisfactorily.   Disposition:  Pt is from Home, admitted with pneumonia with right pleural effusion, still has effusion, PCCM following, on IV antibiotics, which precludes a safe discharge. Discharge to home, when stable and cleared by pulmonary.  Subjective: No significant events overnight.  Patient still has chest congestion, cough with phlegm production, shortness of breath in the morning and she was placed on supplemental O2 inhalation. Currently feeling better.   Physical Exam: General: NAD, lying comfortably Appear in no distress, affect appropriate Eyes: PERRLA ENT: Oral Mucosa Clear, moist  Neck: no JVD,  Cardiovascular: S1 and S2 Present, no Murmur,  Respiratory: good air entry bilaterally, mild bilateral crackles, no wheezes Abdomen: Bowel Sound present, Soft and no tenderness,  Skin: no rashes Extremities: no Pedal edema, no calf tenderness Neurologic: without any new focal findings Gait not checked due to patient safety concerns  Vitals:   11/29/24 0500 11/29/24 0819 11/29/24 1015 11/29/24 1246  BP:  122/78  116/76  Pulse:  91 89 91  Resp:  18 18 18   Temp:  97.7 F (36.5 C)    TempSrc:      SpO2:  90% 93% 98%  Weight: 63.1 kg     Height:        Intake/Output Summary (Last 24 hours) at 11/29/2024 1559 Last data filed at 11/28/2024 1804 Gross per 24 hour  Intake 240 ml  Output --  Net 240 ml   Filed Weights   11/27/24 0342 11/28/24 0500 11/29/24 0500  Weight: 65.1 kg 65.1 kg 63.1 kg    Data Reviewed: I have personally reviewed and interpreted daily labs, tele strips, imagings as discussed above. I reviewed all nursing notes, pharmacy notes, vitals, pertinent old records I have discussed plan of care as described above with RN and patient/family.  CBC: Recent Labs  Lab 11/25/24 1848 11/26/24 0454  11/27/24 0407 11/28/24 0425 11/29/24 0937  WBC 32.9* 29.1* 35.0* 19.3* 20.6*  NEUTROABS 25.3*  --  31.6*  --  14.0*  HGB 14.4 12.7 10.9* 12.4 13.5  HCT 44.6 40.2 33.6* 40.4 42.2  MCV 83.7 84.3 84.4 86.9 84.6  PLT 344 307 324 354 341   Basic Metabolic Panel: Recent Labs  Lab 11/25/24 1848 11/26/24 0454 11/27/24 0407 11/28/24 0425  NA 133* 136 137 140  K 3.8 3.9 3.6 4.9  CL 94* 98 101 103  CO2 21* 26 26 28   GLUCOSE 106* 149* 164* 73  BUN 11 15 15 13   CREATININE 1.01* 1.09* 1.03* 1.00  CALCIUM  8.9 8.7* 8.5* 8.9  MG  --  2.2 2.4 2.5*  PHOS  --  4.2  --   --     Studies: DG CHEST PORT 1 VIEW Result Date: 11/29/2024 CLINICAL DATA:  Right-sided pleural effusion EXAM: PORTABLE CHEST 1 VIEW COMPARISON:  Prior chest x-ray 11/26/2024 FINDINGS: Left subclavian approach cardiac rhythm maintenance device remains in unchanged position with leads projecting over the right atrium and right ventricle. Stable mild cardiomegaly. Improved aeration of the right lung base with decreasing pleural effusion. The pleural effusion is relatively lateral and may be slightly loculated. Persistent hazy and linear right lower lobe airspace opacity favored to reflect atelectasis. The lungs are otherwise clear. No pneumothorax. Unremarkable osseous structures. IMPRESSION: 1. Improving aeration at the right lung base with  decreased pleural effusion and atelectasis. The remaining pleural fluid may be loculated. Electronically Signed   By: Wilkie Lent M.D.   On: 11/29/2024 10:06    Scheduled Meds:  calcium  carbonate  1 tablet Oral Daily   doxycycline   100 mg Oral Q12H   enoxaparin  (LOVENOX ) injection  40 mg Subcutaneous Q24H   guaiFENesin   600 mg Oral BID   ipratropium-albuterol   3 mL Nebulization TID   levothyroxine   100 mcg Oral Q0600   melatonin  6 mg Oral QHS   midodrine   5 mg Oral TID WC   nicotine   14 mg Transdermal Daily   ranolazine   500 mg Oral BID   rosuvastatin   40 mg Oral Daily    Continuous Infusions:  cefTRIAXone  (ROCEPHIN )  IV 2 g (11/29/24 0813)   PRN Meds: acetaminophen , guaiFENesin -dextromethorphan , ipratropium-albuterol , LORazepam , melatonin, polyethylene glycol, prochlorperazine   Time spent: 35 minutes  Author: ELVAN SOR. MD [facility] 11/29/2024 3:59 PM  To reach On-call, see care teams to locate the attending and reach out to them via [email or web address]. If 7PM-7AM, please contact night-coverage If you still have difficulty reaching the attending provider, please page the [facility] (Director on Call) for [facility] on amion for assistance.

## 2024-11-30 DIAGNOSIS — J189 Pneumonia, unspecified organism: Secondary | ICD-10-CM | POA: Diagnosis not present

## 2024-11-30 LAB — CBC
HCT: 44.1 % (ref 36.0–46.0)
Hemoglobin: 14.3 g/dL (ref 12.0–15.0)
MCH: 27.2 pg (ref 26.0–34.0)
MCHC: 32.4 g/dL (ref 30.0–36.0)
MCV: 83.8 fL (ref 80.0–100.0)
Platelets: 345 10*3/uL (ref 150–400)
RBC: 5.26 MIL/uL — ABNORMAL HIGH (ref 3.87–5.11)
RDW: 14.8 % (ref 11.5–15.5)
WBC: 22.5 10*3/uL — ABNORMAL HIGH (ref 4.0–10.5)
nRBC: 0 % (ref 0.0–0.2)

## 2024-11-30 LAB — BASIC METABOLIC PANEL WITH GFR
Anion gap: 9 (ref 5–15)
BUN: 11 mg/dL (ref 8–23)
CO2: 30 mmol/L (ref 22–32)
Calcium: 9.5 mg/dL (ref 8.9–10.3)
Chloride: 99 mmol/L (ref 98–111)
Creatinine, Ser: 1.14 mg/dL — ABNORMAL HIGH (ref 0.44–1.00)
GFR, Estimated: 55 mL/min — ABNORMAL LOW
Glucose, Bld: 89 mg/dL (ref 70–99)
Potassium: 4.9 mmol/L (ref 3.5–5.1)
Sodium: 138 mmol/L (ref 135–145)

## 2024-11-30 LAB — CULTURE, BLOOD (ROUTINE X 2)
Culture: NO GROWTH
Culture: NO GROWTH

## 2024-11-30 LAB — MAGNESIUM: Magnesium: 2.1 mg/dL (ref 1.7–2.4)

## 2024-11-30 LAB — PHOSPHORUS: Phosphorus: 3.8 mg/dL (ref 2.5–4.6)

## 2024-11-30 MED ORDER — BUDESONIDE 0.25 MG/2ML IN SUSP
0.2500 mg | Freq: Two times a day (BID) | RESPIRATORY_TRACT | Status: DC
  Administered 2024-11-30 – 2024-12-01 (×4): 0.25 mg via RESPIRATORY_TRACT
  Filled 2024-11-30 (×4): qty 2

## 2024-11-30 MED ORDER — PREDNISONE 20 MG PO TABS
40.0000 mg | ORAL_TABLET | Freq: Every day | ORAL | Status: DC
  Administered 2024-12-01: 40 mg via ORAL
  Filled 2024-11-30: qty 2

## 2024-11-30 MED ORDER — METHYLPREDNISOLONE SODIUM SUCC 125 MG IJ SOLR
80.0000 mg | Freq: Every day | INTRAMUSCULAR | Status: AC
  Administered 2024-11-30: 80 mg via INTRAVENOUS
  Filled 2024-11-30: qty 2

## 2024-11-30 MED ORDER — ARFORMOTEROL TARTRATE 15 MCG/2ML IN NEBU
15.0000 ug | INHALATION_SOLUTION | Freq: Two times a day (BID) | RESPIRATORY_TRACT | Status: DC
  Administered 2024-11-30 – 2024-12-01 (×4): 15 ug via RESPIRATORY_TRACT
  Filled 2024-11-30 (×4): qty 2

## 2024-11-30 NOTE — Plan of Care (Signed)

## 2024-11-30 NOTE — Progress Notes (Signed)
 [facility] Progress Note  Patient: Whitney May    FMW:[id number]  DOA: 11/25/2024     Date of Service: the patient was seen and examined on 11/30/2024  Chief Complaint  Patient presents with   Shortness of Breath   Brief hospital course: 64 y.o. 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  She denies any fevers or chills, chest pain, nausea, vomiting, diarrhea.   In the ED, the patient was afebrile and hemodynamically stable albeit with soft blood pressures.  Oxygen  saturation was 92-94% on 2 L.  WBC 32.9, hemoglobin 14.4, platelet 344.  CTA chest was negative for PE but showed a moderate right pleural effusion that was slightly loculated.  There was a partial consolidation of the right lower lobe.  The patient was started on ceftriaxone  and azithromycin .  Thoracocentesis was ordered.   Pleural fluid was turbid and showed 2025 WBC. CBC showed rising WBC count up to 35K.  Due to concern for empyema, possibly requiring invasive intervention, pt was transferred to [facility]    Assessment and Plan:  # Sepsis secondary to community-acquired pneumonia, POA Moderate-sized loculated right pleural effusion. -Presented with leukocytosis, 33K, tachypnea 28, pneumonia  -Follow peripheral blood cultures x 2. -Lactic acid peaked  1.9 -initially empiric IV antibiotics Rocephin  and azithromycin >>ceftriaxone  + doxy -11/26/24 diagnostic Thoracocentesis--WBC 2025 - follow pleural fluid culture--neg to date - concerned about underlying empyema with increasing WBC up to 35 K--may need chest tube 1/24 Mucinex  600 mg p.o. twice daily,  Robitussin DM as needed D/w pulmonary, patient may need chest tube but she is on DAPT, need to get in touch with cardiology to stop Brilinta  1/24 discontinued aspirin  and Brilinta  after a.m. dose by pulmonary, patient may need chest tube, which will be done after 2 days   # Moderate-sized loculated right pleural effusion, POA. --11/26/24 diagnostic Thoracocentesis -Follow cultures--neg - WBC 2025 (35 N, 54 Eos, 9 mono) - gram stain--no organism - concerned about underlying empyema with rising WBC - pulmonary consult as discussed above 1/24 discontinued aspirin  and Brilinta  after a.m. dose by pulmonary, patient may need chest tube    # Acute respiratory failure with hypoxia # COPD exacerbation -Presented with tachypnea and oxygen  saturation 90% on room air. -Stable on 2 L nasal cannula -Wean as tolerated for saturation greater than 92% 1/25 patient significantly wheezing, started Brovana  and Pulmicort  nebulizer twice daily. Started Solu-Medrol  40 mg IV twice daily x 2 doses followed by prednisone  40 mg p.o. daily for 3 days     # Hypotension -review of records shows pt has had soft BPs in 90s -continue judicious fluids -added midodrine >>improving   # Chronic HFrEF Ischemic cardiomyopathy -Clinically euvolemic/compensated -Echo 07/04/2024 revealed LVEF 25 to 30% with left ventricle global hypokinesis.  Pericardial effusion anterior to the right ventricle. -Daily weights   # Coronary artery disease status post PCI with stent placement No reports of chest pain. Resume home aspirin , Brilinta , and Crestor . Resume home statin -continue ranexa    # Ischemic cardiomyopathy status post ICD insertion with RV ICD microperforation Follows with cardiology Resume home regimen   # Left lower extremity edema and pain behind the knee, POA Left lower extremity ultrasound--neg DVT Pain control.   #  Hypothyroidism Resume home levothyroxine .   # Chronic LBBB Resume home regimen.   #  Hyperlipidemia Resume home statin   # Current tobacco user Counseled on the importance of complete tobacco use. -nicoderm patch -Was previously smoking 1 pack/day, cut down a few days ago to 5 cigarettes/day this past week     Body mass index is 24.84 kg/m.  Interventions:  Diet: Heart healthy diet DVT Prophylaxis: Subcutaneous Lovenox    Advance goals of care discussion: Full code  Family Communication: family was not present at bedside, at the time of interview.  The pt provided permission to discuss medical plan with the family. Opportunity was given to ask question and all questions were answered satisfactorily.   Disposition:  Pt is from Home, admitted with pneumonia with right pleural effusion, still has effusion, PCCM following, on IV antibiotics, which precludes a safe discharge. Discharge to home, when stable and cleared by pulmonary.  Subjective: No significant events overnight.  Patient is feeling improvement in the shortness of breath, still has cough with phlegm production.  Denies any chest pain or palpitations   Physical Exam: General: NAD, lying comfortably Appear in no distress, affect appropriate Eyes: PERRLA ENT: Oral Mucosa Clear, moist  Neck: no JVD,  Cardiovascular: S1 and S2 Present, no Murmur,  Respiratory: good air entry bilaterally, mild bilateral crackles, significant wheezes noticed bilaterally today Abdomen: Bowel Sound present, Soft and no tenderness,  Skin: no rashes Extremities: no Pedal edema, no calf tenderness Neurologic: without any new focal findings Gait not checked due to patient safety concerns  Vitals:   11/30/24 0401 11/30/24 0500 11/30/24 0841 11/30/24 1222  BP: 118/60  98/64 117/81  Pulse: 81  88 95  Resp: 20  18 18   Temp: 97.7 F (36.5 C)     TempSrc:      SpO2: 94%  92% 97%  Weight:  61.6 kg    Height:        Intake/Output Summary (Last 24 hours) at 11/30/2024 1250 Last data filed at 11/30/2024 0617 Gross per 24 hour   Intake 480 ml  Output --  Net 480 ml   Filed Weights   11/28/24 0500 11/29/24 0500 11/30/24 0500  Weight: 65.1 kg 63.1 kg 61.6 kg    Data Reviewed: I have personally reviewed and interpreted daily labs, tele strips, imagings as discussed above. I reviewed all nursing notes, pharmacy notes, vitals, pertinent old records I have discussed plan of care as described above with RN and patient/family.  CBC: Recent Labs  Lab 11/25/24 1848 11/26/24 0454 11/27/24 0407 11/28/24 0425 11/29/24 0937 11/30/24 0658  WBC 32.9* 29.1* 35.0* 19.3* 20.6* 22.5*  NEUTROABS 25.3*  --  31.6*  --  14.0*  --   HGB 14.4 12.7 10.9* 12.4 13.5 14.3  HCT 44.6 40.2 33.6* 40.4 42.2 44.1  MCV 83.7 84.3 84.4 86.9 84.6 83.8  PLT 344 307 324 354 341 345   Basic Metabolic Panel: Recent Labs  Lab 11/25/24 1848 11/26/24 0454 11/27/24 0407 11/28/24 0425 11/30/24 0658  NA 133* 136 137 140 138  K 3.8 3.9 3.6 4.9 4.9  CL 94* 98 101 103 99  CO2 21* 26 26 28 30   GLUCOSE 106* 149* 164* 73 89  BUN 11 15 15 13 11   CREATININE 1.01* 1.09* 1.03* 1.00 1.14*  CALCIUM  8.9 8.7* 8.5* 8.9 9.5  MG  --  2.2 2.4 2.5* 2.1  PHOS  --  4.2  --   --  3.8    Studies:  No results found.   Scheduled Meds:  calcium  carbonate  1 tablet Oral Daily   doxycycline   100 mg Oral Q12H   enoxaparin  (LOVENOX ) injection  40 mg Subcutaneous Q24H   guaiFENesin   600 mg Oral BID   ipratropium-albuterol   3 mL Nebulization TID   levothyroxine   100 mcg Oral Q0600   melatonin  6 mg Oral QHS   midodrine   5 mg Oral TID WC   nicotine   14 mg Transdermal Daily   ranolazine   500 mg Oral BID   rosuvastatin   40 mg Oral Daily   Continuous Infusions:  cefTRIAXone  (ROCEPHIN )  IV 2 g (11/30/24 0936)   PRN Meds: acetaminophen , guaiFENesin -dextromethorphan , ipratropium-albuterol , LORazepam , melatonin, polyethylene glycol, prochlorperazine   Time spent: 55 minutes  Author: ELVAN SOR. MD [facility] 11/30/2024 12:50 PM  To reach  On-call, see care teams to locate the attending and reach out to them via [email or web address]. If 7PM-7AM, please contact night-coverage If you still have difficulty reaching the attending provider, please page the [facility] (Director on Call) for [facility] on amion for assistance.

## 2024-12-01 ENCOUNTER — Ambulatory Visit (HOSPITAL_COMMUNITY)

## 2024-12-01 DIAGNOSIS — J189 Pneumonia, unspecified organism: Secondary | ICD-10-CM | POA: Diagnosis not present

## 2024-12-01 LAB — BASIC METABOLIC PANEL WITH GFR
Anion gap: 10 (ref 5–15)
BUN: 22 mg/dL (ref 8–23)
CO2: 28 mmol/L (ref 22–32)
Calcium: 8.7 mg/dL — ABNORMAL LOW (ref 8.9–10.3)
Chloride: 95 mmol/L — ABNORMAL LOW (ref 98–111)
Creatinine, Ser: 1.17 mg/dL — ABNORMAL HIGH (ref 0.44–1.00)
GFR, Estimated: 53 mL/min — ABNORMAL LOW
Glucose, Bld: 227 mg/dL — ABNORMAL HIGH (ref 70–99)
Potassium: 4.3 mmol/L (ref 3.5–5.1)
Sodium: 133 mmol/L — ABNORMAL LOW (ref 135–145)

## 2024-12-01 LAB — CBC
HCT: 39.8 % (ref 36.0–46.0)
Hemoglobin: 12.8 g/dL (ref 12.0–15.0)
MCH: 26.9 pg (ref 26.0–34.0)
MCHC: 32.2 g/dL (ref 30.0–36.0)
MCV: 83.6 fL (ref 80.0–100.0)
Platelets: 323 10*3/uL (ref 150–400)
RBC: 4.76 MIL/uL (ref 3.87–5.11)
RDW: 14.7 % (ref 11.5–15.5)
WBC: 23.2 10*3/uL — ABNORMAL HIGH (ref 4.0–10.5)
nRBC: 0 % (ref 0.0–0.2)

## 2024-12-01 LAB — CULTURE, BODY FLUID W GRAM STAIN -BOTTLE: Culture: NO GROWTH

## 2024-12-01 MED ORDER — ENOXAPARIN SODIUM 40 MG/0.4ML IJ SOSY
40.0000 mg | PREFILLED_SYRINGE | INTRAMUSCULAR | Status: DC
  Administered 2024-12-03 – 2024-12-05 (×3): 40 mg via SUBCUTANEOUS
  Filled 2024-12-01 (×3): qty 0.4

## 2024-12-01 MED ORDER — POLYVINYL ALCOHOL 1.4 % OP SOLN
1.0000 [drp] | OPHTHALMIC | Status: AC | PRN
  Administered 2024-12-01: 1 [drp] via OPHTHALMIC
  Filled 2024-12-01: qty 15

## 2024-12-01 MED ORDER — PREDNISONE 20 MG PO TABS
20.0000 mg | ORAL_TABLET | Freq: Every day | ORAL | Status: AC
  Administered 2024-12-02 – 2024-12-03 (×2): 20 mg via ORAL
  Filled 2024-12-01 (×2): qty 1

## 2024-12-01 MED ORDER — ENOXAPARIN SODIUM 40 MG/0.4ML IJ SOSY: 40.0000 mg | PREFILLED_SYRINGE | INTRAMUSCULAR | Status: DC

## 2024-12-01 NOTE — Significant Event (Signed)
 Reviewed the patient's chart.  She has not received Brilinta  for last 2 days.  However received a dose of Lovenox , prophylactic dose. Considering the patient is hemodynamically stable otherwise though with worsening white cell count we will hold off on doing chest tube today. She has been booked for chest tube in Endo suite for tomorrow. Please do not give prophylactic dose of Lovenox  and continue to hold Brilinta .  PT 16.2 and INR 1.2 on 1/20.

## 2024-12-01 NOTE — Progress Notes (Signed)
 [facility] Progress Note  Patient: Whitney May    FMW:[id number]  DOA: 11/25/2024     Date of Service: the patient was seen and examined on 12/01/2024  Chief Complaint  Patient presents with   Shortness of Breath   Brief hospital course: 64 y.o. 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  She denies any fevers or chills, chest pain, nausea, vomiting, diarrhea.   In the ED, the patient was afebrile and hemodynamically stable albeit with soft blood pressures.  Oxygen  saturation was 92-94% on 2 L.  WBC 32.9, hemoglobin 14.4, platelet 344.  CTA chest was negative for PE but showed a moderate right pleural effusion that was slightly loculated.  There was a partial consolidation of the right lower lobe.  The patient was started on ceftriaxone  and azithromycin .  Thoracocentesis was ordered.   Pleural fluid was turbid and showed 2025 WBC. CBC showed rising WBC count up to 35K.  Due to concern for empyema, possibly requiring invasive intervention, pt was transferred to [facility]    Assessment and Plan:  # Sepsis secondary to community-acquired pneumonia, POA Moderate-sized loculated right pleural effusion. -Presented with leukocytosis, 33K, tachypnea 28, pneumonia  -Follow peripheral blood cultures x 2. -Lactic acid peaked  1.9 -initially empiric IV antibiotics Rocephin  and azithromycin >>ceftriaxone  + doxy -11/26/24 diagnostic Thoracocentesis--WBC 2025 - follow pleural fluid culture--neg to date - concerned about underlying empyema with increasing WBC up to 35 K--may need chest tube 1/24 Mucinex  600 mg p.o. twice daily,  Robitussin DM as needed D/w pulmonary, patient may need chest tube but she is on DAPT, need to get in touch with cardiology to stop Brilinta  1/24 discontinued aspirin  and Brilinta  after a.m. dose by pulmonary, patient may need chest tube, which will be done after 2 days    # Moderate-sized loculated right pleural effusion, POA. --11/26/24 diagnostic Thoracocentesis -Follow cultures--neg - WBC 2025 (35 N, 54 Eos, 9 mono) - gram stain--no organism - concerned about underlying empyema with rising WBC - pulmonary consult as discussed above 1/24 discontinued aspirin  and Brilinta  after a.m. dose by pulmonary, patient may need chest tube 1/26 d/w PCCM, hold Lovenox  tomorrow and chest tube will be inserted tomorrow a.m.   # Acute respiratory failure with hypoxia # COPD exacerbation -Presented with tachypnea and oxygen  saturation 90% on room air. -Stable on 2 L nasal cannula -Wean as tolerated for saturation greater than 92% 1/25 patient significantly wheezing, started Brovana  and Pulmicort  nebulizer twice daily. S/p Solu-Medrol  80 mg IV x one ose, followed by prednisone  40 mg p.o. daily x 1 and 20 mg pod x 2 doses  1/26 patient could not handle high-dose steroids, having anxiety, so decreased dose of prednisone      # Hypotension -review of records shows pt has had soft BPs in 90s -continue judicious fluids -added midodrine >>improving   # Chronic HFrEF Ischemic cardiomyopathy -Clinically euvolemic/compensated -Echo 07/04/2024 revealed LVEF 25 to 30% with left ventricle global hypokinesis.  Pericardial effusion anterior to the right ventricle. -Daily weights   # Coronary artery disease status post PCI with stent placement No reports of chest pain. Resume home aspirin , Brilinta , and Crestor . Resume home statin -continue ranexa    # Ischemic cardiomyopathy status post  ICD insertion with RV ICD microperforation Follows with cardiology Resume home regimen   # Left lower extremity edema  and pain behind the knee, POA Left lower extremity ultrasound--neg DVT Pain control.   # Hypothyroidism Resume home levothyroxine .   # Chronic LBBB Resume home regimen.   # Hyperlipidemia Resume home statin   # Current tobacco user Counseled on the importance of complete tobacco use. -nicoderm patch -Was previously smoking 1 pack/day, cut down a few days ago to 5 cigarettes/day this past week     Body mass index is 25.48 kg/m.  Interventions:  Diet: Heart healthy diet DVT Prophylaxis: Subcutaneous Lovenox    Advance goals of care discussion: Full code  Family Communication: family was not present at bedside, at the time of interview.  The pt provided permission to discuss medical plan with the family. Opportunity was given to ask question and all questions were answered satisfactorily.   Disposition:  Pt is from Home, admitted with pneumonia with right pleural effusion, still has effusion, PCCM following, on IV antibiotics, which precludes a safe discharge. Discharge to home, when stable and cleared by pulmonary.  Subjective: No significant events overnight.  Patient is feeling improvement in the shortness of breath, very mild cough with phlegm production.  Patient could not handle the steroids which were given yesterday, decreased dose of prednisone  today.  As per patient steroids were making her anxious.    Physical Exam: General: NAD, lying comfortably Appear in no distress, affect appropriate Eyes: PERRLA ENT: Oral Mucosa Clear, moist  Neck: no JVD,  Cardiovascular: S1 and S2 Present, no Murmur,  Respiratory: good air entry bilaterally, mild bilateral crackles, and minimal wheezes. Abdomen: Bowel Sound present, Soft and no tenderness,  Skin: no rashes Extremities: no Pedal edema, no calf tenderness Neurologic: without any new focal findings Gait not checked due to patient safety concerns  Vitals:   12/01/24 0416 12/01/24 0500 12/01/24 0809 12/01/24 1144  BP:  106/63  (!) 100/54 (!) 106/57  Pulse: 67  87 94  Resp: 20  16 16   Temp: (!) 97.4 F (36.3 C)   98 F (36.7 C)  TempSrc:      SpO2: 95%  93% 94%  Weight:  63.2 kg    Height:        Intake/Output Summary (Last 24 hours) at 12/01/2024 1327 Last data filed at 12/01/2024 1028 Gross per 24 hour  Intake 778.22 ml  Output --  Net 778.22 ml   Filed Weights   11/29/24 0500 11/30/24 0500 12/01/24 0500  Weight: 63.1 kg 61.6 kg 63.2 kg    Data Reviewed: I have personally reviewed and interpreted daily labs, tele strips, imagings as discussed above. I reviewed all nursing notes, pharmacy notes, vitals, pertinent old records I have discussed plan of care as described above with RN and patient/family.  CBC: Recent Labs  Lab 11/25/24 1848 11/26/24 0454 11/27/24 0407 11/28/24 0425 11/29/24 9062 11/30/24 0658 12/01/24 0037  WBC 32.9*   < > 35.0* 19.3* 20.6* 22.5* 23.2*  NEUTROABS 25.3*  --  31.6*  --  14.0*  --   --   HGB 14.4   < > 10.9* 12.4 13.5 14.3 12.8  HCT 44.6   < > 33.6* 40.4 42.2 44.1 39.8  MCV 83.7   < > 84.4 86.9 84.6 83.8 83.6  PLT 344   < > 324 354 341 345 323   < > = values in this interval not displayed.   Basic Metabolic Panel: Recent Labs  Lab 11/26/24 0454 11/27/24 0407 11/28/24 0425 11/29/24 0937 11/30/24 0658 12/01/24 0037  NA 136 137 140 135 138 133*  K 3.9 3.6 4.9 4.2 4.9 4.3  CL 98 101 103 99 99 95*  CO2 26 26 28 27 30 28   GLUCOSE 149* 164* 73 89 89 227*  BUN 15 15 13 10 11 22   CREATININE 1.09* 1.03* 1.00 0.84 1.14* 1.17*  CALCIUM  8.7* 8.5* 8.9 8.7* 9.5 8.7*  MG 2.2 2.4 2.5* 1.8 2.1  --   PHOS 4.2  --   --   --  3.8  --     Studies: No results found.   Scheduled Meds:  arformoterol   15 mcg Nebulization BID   And   budesonide  (PULMICORT ) nebulizer solution  0.25 mg Nebulization BID   calcium  carbonate  1 tablet Oral Daily   doxycycline   100 mg Oral Q12H   [START ON 12/03/2024] enoxaparin  (LOVENOX ) injection  40 mg Subcutaneous Q24H    guaiFENesin   600 mg Oral BID   ipratropium-albuterol   3 mL Nebulization TID   levothyroxine   100 mcg Oral Q0600   melatonin  6 mg Oral QHS   midodrine   5 mg Oral TID WC   nicotine   14 mg Transdermal Daily   predniSONE   40 mg Oral Q breakfast   ranolazine   500 mg Oral BID   rosuvastatin   40 mg Oral Daily   Continuous Infusions:  cefTRIAXone  (ROCEPHIN )  IV Stopped (12/01/24 1028)   PRN Meds: acetaminophen , artificial tears, guaiFENesin -dextromethorphan , ipratropium-albuterol , LORazepam , melatonin, polyethylene glycol, prochlorperazine   Time spent: 40 minutes  Author: ELVAN SOR. MD [facility] 12/01/2024 1:27 PM  To reach On-call, see care teams to locate the attending and reach out to them via [email or web address]. If 7PM-7AM, please contact night-coverage If you still have difficulty reaching the attending provider, please page the [facility] (Director on Call) for [facility] on amion for assistance.

## 2024-12-01 NOTE — Plan of Care (Signed)

## 2024-12-02 ENCOUNTER — Encounter (HOSPITAL_COMMUNITY): Payer: Self-pay | Admitting: Internal Medicine

## 2024-12-02 ENCOUNTER — Inpatient Hospital Stay (HOSPITAL_COMMUNITY)

## 2024-12-02 ENCOUNTER — Encounter (HOSPITAL_COMMUNITY): Admission: EM | Payer: Self-pay | Source: Ambulatory Visit | Attending: Student

## 2024-12-02 ENCOUNTER — Telehealth (HOSPITAL_COMMUNITY): Payer: Self-pay

## 2024-12-02 ENCOUNTER — Other Ambulatory Visit (HOSPITAL_COMMUNITY): Payer: Self-pay

## 2024-12-02 DIAGNOSIS — J189 Pneumonia, unspecified organism: Secondary | ICD-10-CM | POA: Diagnosis not present

## 2024-12-02 DIAGNOSIS — D721 Eosinophilia, unspecified: Secondary | ICD-10-CM | POA: Diagnosis not present

## 2024-12-02 DIAGNOSIS — Z720 Tobacco use: Secondary | ICD-10-CM | POA: Diagnosis not present

## 2024-12-02 DIAGNOSIS — J90 Pleural effusion, not elsewhere classified: Secondary | ICD-10-CM | POA: Diagnosis not present

## 2024-12-02 LAB — BASIC METABOLIC PANEL WITH GFR
Anion gap: 8 (ref 5–15)
BUN: 16 mg/dL (ref 8–23)
CO2: 28 mmol/L (ref 22–32)
Calcium: 8.7 mg/dL — ABNORMAL LOW (ref 8.9–10.3)
Chloride: 101 mmol/L (ref 98–111)
Creatinine, Ser: 0.89 mg/dL (ref 0.44–1.00)
GFR, Estimated: >60 mL/min
Glucose, Bld: 103 mg/dL — ABNORMAL HIGH (ref 70–99)
Potassium: 4.3 mmol/L (ref 3.5–5.1)
Sodium: 137 mmol/L (ref 135–145)

## 2024-12-02 LAB — CBC
HCT: 37.9 % (ref 36.0–46.0)
Hemoglobin: 12.1 g/dL (ref 12.0–15.0)
MCH: 27.1 pg (ref 26.0–34.0)
MCHC: 31.9 g/dL (ref 30.0–36.0)
MCV: 84.8 fL (ref 80.0–100.0)
Platelets: 337 10*3/uL (ref 150–400)
RBC: 4.47 MIL/uL (ref 3.87–5.11)
RDW: 15.1 % (ref 11.5–15.5)
WBC: 26.9 10*3/uL — ABNORMAL HIGH (ref 4.0–10.5)
nRBC: 0 % (ref 0.0–0.2)

## 2024-12-02 LAB — C-REACTIVE PROTEIN: CRP: 1.5 mg/dL — ABNORMAL HIGH

## 2024-12-02 LAB — PROCALCITONIN: Procalcitonin: <0.1 ng/mL

## 2024-12-02 LAB — SEDIMENTATION RATE: Sed Rate: 4 mm/h (ref 0–22)

## 2024-12-02 LAB — CYTOLOGY - NON PAP

## 2024-12-02 LAB — MAGNESIUM: Magnesium: 2.2 mg/dL (ref 1.7–2.4)

## 2024-12-02 LAB — PHOSPHORUS: Phosphorus: 3.7 mg/dL (ref 2.5–4.6)

## 2024-12-02 MED ORDER — MORPHINE SULFATE 4 MG/ML IJ SOLN
1.0000 mg | Freq: Once | INTRAMUSCULAR | Status: AC
  Administered 2024-12-02: 1 mg via INTRAVENOUS

## 2024-12-02 MED ORDER — MORPHINE SULFATE (PF) 2 MG/ML IV SOLN
INTRAVENOUS | Status: AC
  Filled 2024-12-02: qty 1

## 2024-12-02 MED ORDER — FLUTICASONE FUROATE-VILANTEROL 100-25 MCG/ACT IN AEPB
1.0000 | INHALATION_SPRAY | Freq: Every day | RESPIRATORY_TRACT | Status: AC
  Administered 2024-12-02 – 2024-12-12 (×11): 1 via RESPIRATORY_TRACT
  Filled 2024-12-02: qty 28

## 2024-12-02 NOTE — Procedures (Signed)
 Insertion of Chest Tube Procedure Note, ultrasound-guided:  Whitney May  [id number]  [birth year]  Date:12/02/24  Time:2:23 PM    Provider Performing: Sammi Fredericks   Procedure: Chest Tube Insertion (32551)  Indication(s) Effusion  Consent Risks of the procedure as well as the alternatives and risks of each were explained to the patient and/or caregiver.  Consent for the procedure was obtained and is signed in the bedside chart  Anesthesia Topical only with 1% lidocaine     Time Out Verified patient identification, verified procedure, site/side was marked, verified correct patient position, special equipment/implants available, medications/allergies/relevant history reviewed, required imaging and test results available.   Sterile Technique Maximal sterile technique including full sterile barrier drape, hand hygiene, sterile gown, sterile gloves, mask, hair covering, sterile ultrasound probe cover (if used).   Procedure Description Ultrasound used to identify appropriate pleural anatomy for placement and overlying skin marked.  Please see media for images.  Area of placement cleaned and draped in sterile fashion.  Needle was inserted into the pleural space with only removal of 5 cc.  Fluid was bloody.  Unfortunately, because of patient being on Brilinta  [stopped 3 days ago] and no continuous aspiration of fluid in the syringe I did not feel comfortable passing the guidewire in.  I could not be sure of guidewire being inside the pleural space because of that, hence procedure aborted.     Complications/Tolerance None; patient tolerated the procedure well. Chest X-ray is ordered to verify placement.   EBL Minimal  Specimen(s) fluid sent for cultures

## 2024-12-02 NOTE — Consult Note (Signed)
 "  NAME:  Whitney May, MRN:  [id number], DOB:  [birth year], LOS: 7 ADMISSION DATE:  11/25/2024, CONSULTATION DATE:  11/29/24 REFERRING MD:  Tat, CHIEF COMPLAINT:  abnormal chest imaging    History of Present Illness:  64 yo F  PMH tobacco use, CAD with prior PCI on DAPT (ASA brilinta ), HFrEF, ICM, s/p ICD, who was admitted to [facility]H at [facility] after presenting 1/21 w SOB/cough. Started tx for PNA and underwent thora 1/21 with IR for R pleural effusion.  Fluid was loculated and incompletely drained so transfer was requested to [facility] campus for chest tube placement.   Pt arrived days 1/24    Pertinent  Medical History  CAD ICM HFrEF S/p ICD   Significant Hospital Events: Including procedures, antibiotic start and stop dates in addition to other pertinent events   1/20 admit to [facility]  1/21 thora w IR 1/22 txf request to [facility] for chest tube 1/24 arrives to [facility].  1/27: Failed attempt at chest tube placement.  Removal of only 5 cc of fluid.  Interim History / Subjective:  No overnight events.  She says she is feeling well.  However her white cell count is trending up. Post chest tube placement attempt thoracentesis reviewed and no signs of pneumothorax.  Objective    Blood pressure (!) 111/58, pulse 73, temperature (!) 97.2 F (36.2 C), temperature source Temporal, resp. rate 16, height 5' 2 (1.575 m), weight 64.5 kg, SpO2 94%.        Intake/Output Summary (Last 24 hours) at 12/02/2024 1425 Last data filed at 12/01/2024 2030 Gross per 24 hour  Intake 400 ml  Output --  Net 400 ml   Filed Weights   12/01/24 0500 12/02/24 0500 12/02/24 1310  Weight: 63.2 kg 64.5 kg 64.5 kg    Examination: General: Elderly female not in distress. Lungs: clear to auscultation bilaterally.  Heart: regular rate rhythm, no murmur appreciated.  Abdomen: non tender, non distended. Normal BS.  Neuro: axox 3.  All extremities.   Resolved problem list   Assessment and Plan   Right unilateral loculated  effusion: Elevated eosinophil count in pleural fluid likely traumatic: Bacterial pneumonia: -Agree with treatment for bacterial pneumonia with ceftriaxone  and doxycycline .  Complete 7-day course. -Incomplete pleural fluid studies sent.  Based on the available studies patient does not have complicated parapneumonic effusion.  The patient does not have empyema based on pleural fluid appearance.  LDH was not sent.  Likely exudative based on total protein of 4.1.  Body protein 7.2. -POCUS showing at least moderate size right loculated pleural effusion with septation.  Chest tube placement attempted on 1/27.  However with return of only 5 cc fluid, no visualization of where the guidewire is going and patient still being under the effect of Brilinta  I did not feel comfortable trying to advance the guidewire through the septation.  Procedure was aborted.  The patient continues to have loculated right-sided pleural effusion. - Continue to hold Brilinta  and DVT prophylaxis Lovenox . -Consider consulting IR for CT/ultrasound-guided chest tube placement.  If IR is unsuccessful will have to consider consulting CT surgery for VATS. - If chest tube is placed patient likely will need lytics. -Toxoplasma PCR and strongyloidiasis antibody ordered.  Pulmonary will follow.  Labs   CBC: Recent Labs  Lab 11/25/24 1848 11/26/24 0454 11/27/24 0407 11/28/24 0425 11/29/24 9062 11/30/24 0658 12/01/24 0037 12/02/24 0501  WBC 32.9*   < > 35.0* 19.3* 20.6* 22.5* 23.2* 26.9*  NEUTROABS 25.3*  --  31.6*  --  14.0*  --   --   --   HGB 14.4   < > 10.9* 12.4 13.5 14.3 12.8 12.1  HCT 44.6   < > 33.6* 40.4 42.2 44.1 39.8 37.9  MCV 83.7   < > 84.4 86.9 84.6 83.8 83.6 84.8  PLT 344   < > 324 354 341 345 323 337   < > = values in this interval not displayed.    Basic Metabolic Panel: Recent Labs  Lab 11/26/24 0454 11/27/24 0407 11/28/24 0425 11/29/24 0937 11/30/24 0658 12/01/24 0037 12/02/24 0501  NA 136 137 140  135 138 133* 137  K 3.9 3.6 4.9 4.2 4.9 4.3 4.3  CL 98 101 103 99 99 95* 101  CO2 26 26 28 27 30 28 28   GLUCOSE 149* 164* 73 89 89 227* 103*  BUN 15 15 13 10 11 22 16   CREATININE 1.09* 1.03* 1.00 0.84 1.14* 1.17* 0.89  CALCIUM  8.7* 8.5* 8.9 8.7* 9.5 8.7* 8.7*  MG 2.2 2.4 2.5* 1.8 2.1  --  2.2  PHOS 4.2  --   --   --  3.8  --  3.7   GFR: Estimated Creatinine Clearance: 58.6 mL/min (by C-G formula based on SCr of 0.89 mg/dL). Recent Labs  Lab 11/25/24 1914 11/25/24 2032 11/26/24 0454 11/27/24 0407 11/28/24 0425 11/29/24 9062 11/30/24 0658 12/01/24 0037 12/02/24 0501  PROCALCITON  --   --   --  0.26  --   --   --   --  <0.10  WBC  --   --    < > 35.0*   < > 20.6* 22.5* 23.2* 26.9*  LATICACIDVEN 1.2 1.9  --   --   --   --   --   --   --    < > = values in this interval not displayed.    Liver Function Tests: Recent Labs  Lab 11/25/24 1848 11/27/24 0407  AST 13* 33  ALT <5 20  ALKPHOS 103 111  BILITOT 0.9 0.2  PROT 7.2 5.8*  ALBUMIN 3.5 2.9*   No results for input(s): LIPASE, AMYLASE in the last 168 hours. No results for input(s): AMMONIA in the last 168 hours.  ABG    Component Value Date/Time   TCO2 24 09/18/2022 1507     Coagulation Profile: Recent Labs  Lab 11/25/24 1848  INR 1.2    Cardiac Enzymes: No results for input(s): CKTOTAL, CKMB, CKMBINDEX, TROPONINI in the last 168 hours.  HbA1C: Hgb A1c MFr Bld  Date/Time Value Ref Range Status  09/18/2022 03:13 PM 5.3 4.8 - 5.6 % Final    Comment:    (NOTE) Pre diabetes:          5.7%-6.4%  Diabetes:              >6.4%  Glycemic control for   <7.0% adults with diabetes   08/02/2018 05:33 PM 5.5 4.8 - 5.6 % Final    Comment:    (NOTE) Pre diabetes:          5.7%-6.4% Diabetes:              >6.4% Glycemic control for   <7.0% adults with diabetes     CBG: No results for input(s): GLUCAP in the last 168 hours.   Past Medical History:  She,  has a past medical history of  Alcohol  use, CAD (coronary artery disease), Chest pain, Hyperlipidemia, mixed, Hypotension, Hypothyroidism, Ischemic cardiomyopathy, LBBB (left  bundle branch block), MI (myocardial infarction) (HCC), and Tobacco abuse.   Surgical History:   Past Surgical History:  Procedure Laterality Date   BIV ICD INSERTION CRT-D N/A 05/16/2024   Procedure: BIV ICD INSERTION CRT-D;  Surgeon: Mealor, Eulas BRAVO, MD;  Location: [facility] INVASIVE CV LAB;  Service: Cardiovascular;  Laterality: N/A;   CESAREAN SECTION     CORONARY STENT INTERVENTION N/A 03/07/2019   Procedure: CORONARY STENT INTERVENTION;  Surgeon: Wonda Sharper, MD;  Location: [facility] INVASIVE CV LAB;  Service: Cardiovascular;  Laterality: N/A;   CORONARY/GRAFT ACUTE MI REVASCULARIZATION N/A 08/02/2018   Procedure: Coronary/Graft Acute MI Revascularization;  Surgeon: Anner Alm ORN, MD;  Location: [facility] INVASIVE CV LAB;  Service: Cardiovascular;  Laterality: N/A;   CORONARY/GRAFT ACUTE MI REVASCULARIZATION N/A 09/18/2022   Procedure: Coronary/Graft Acute MI Revascularization;  Surgeon: Court Dorn PARAS, MD;  Location: [facility] INVASIVE CV LAB;  Service: Cardiovascular;  Laterality: N/A;   ICD IMPLANT Left    LEAD REVISION/REPAIR N/A 07/03/2024   Procedure: LEAD REVISION/REPAIR;  Surgeon: Inocencio Soyla Lunger, MD;  Location: [facility] INVASIVE CV LAB;  Service: Cardiovascular;  Laterality: N/A;   LEFT HEART CATH AND CORONARY ANGIOGRAPHY N/A 08/02/2018   Procedure: LEFT HEART CATH AND CORONARY ANGIOGRAPHY;  Surgeon: Anner Alm ORN, MD;  Location: [facility] INVASIVE CV LAB;  Service: Cardiovascular;  Laterality: N/A;   LEFT HEART CATH AND CORONARY ANGIOGRAPHY N/A 03/07/2019   Procedure: LEFT HEART CATH AND CORONARY ANGIOGRAPHY;  Surgeon: Cherrie Toribio SAUNDERS, MD;  Location: [facility] INVASIVE CV LAB;  Service: Cardiovascular;  Laterality: N/A;   LEFT HEART CATH AND CORONARY ANGIOGRAPHY N/A 09/18/2022   Procedure: LEFT HEART CATH AND CORONARY ANGIOGRAPHY;  Surgeon: Court Dorn PARAS,  MD;  Location: [facility] INVASIVE CV LAB;  Service: Cardiovascular;  Laterality: N/A;   LEFT HEART CATH AND CORONARY ANGIOGRAPHY N/A 04/16/2023   Procedure: LEFT HEART CATH AND CORONARY ANGIOGRAPHY;  Surgeon: Anner Alm ORN, MD;  Location: [facility] INVASIVE CV LAB;  Service: Cardiovascular;  Laterality: N/A;     Social History:   reports that she has been smoking cigarettes. She started smoking about 28 years ago. She has a 22 pack-year smoking history. She has never used smokeless tobacco. She reports current alcohol  use of about 8.0 standard drinks of alcohol  per week. She reports that she does not use drugs.   Family History:  Her family history includes Coronary artery disease in her maternal grandmother and mother; Diabetes in her father; Hypertension in her father.   Allergies Allergies[1]   Home Medications  Prior to Admission medications  Medication Sig Start Date End Date Taking? Authorizing Provider  acetaminophen  (TYLENOL ) 500 MG tablet Take 1,000 mg by mouth every 6 (six) hours as needed for moderate pain (pain score 4-6).   Yes [provider]  aspirin  81 MG chewable tablet Chew 1 tablet (81 mg total) by mouth daily. 06/08/23  Yes Milford, Harlene HERO, FNP  bisoprolol  (ZEBETA ) 5 MG tablet TAKE 1/2 TABLET BY MOUTH ONCE DAILY 10/16/24  Yes McLean, Dalton S, MD  calcium  carbonate (TUMS - DOSED IN MG ELEMENTAL CALCIUM ) 500 MG chewable tablet Chew 1 tablet by mouth daily as needed for indigestion or heartburn.   Yes [provider]  empagliflozin  (JARDIANCE ) 10 MG TABS tablet TAKE 1 TABLET BY MOUTH DAILY BEFORE BREAKFAST 11/14/24  Yes Rolan Ezra RAMAN, MD  FT NICOTINE  21 MG/24HR patch Place 21 mg onto the skin daily. 10/14/24  Yes [provider]  furosemide  (LASIX ) 20 MG tablet Take 1  tablet (20 mg total) by mouth daily as needed for weight gain of 3 lbs in 24 hours or 5 lbs in a week 04/02/24 11/26/24 Yes Lee, Jordan, NP  isosorbide  mononitrate (IMDUR ) 30 MG 24 hr tablet TAKE  1 TABLET BY MOUTH DAILY 10/16/24  Yes McLean, Dalton S, MD  levothyroxine  (SYNTHROID ) 75 MCG tablet Take 75 mcg by mouth every morning. 11/17/24  Yes [provider]  losartan  (COZAAR ) 25 MG tablet TAKE 1/2 TABLET BY MOUTH DAILY 09/04/24  Yes Rolan Ezra RAMAN, MD  nitroGLYCERIN  (NITROSTAT ) 0.4 MG SL tablet Place 1 tablet (0.4 mg total) under the tongue every 5 (five) minutes x 3 doses as needed for chest pain. 07/05/23  Yes Rolan Ezra RAMAN, MD  ranolazine  (RANEXA ) 500 MG 12 hr tablet Take 1 tablet (500 mg total) by mouth 2 (two) times daily. PLEASE SCHEDULE APPOINTMENT FOR MORE REFILLS [phone] OPTION 2 11/14/24  Yes Rolan Ezra RAMAN, MD  rosuvastatin  (CRESTOR ) 40 MG tablet Take 1 tablet (40 mg total) by mouth daily. PLEASE SCHEDULE APPOINTMENT FOR MORE REFILLS [phone] OPTION 2 10/27/24  Yes Rolan Ezra RAMAN, MD  ticagrelor  (BRILINTA ) 90 MG TABS tablet Take 1 tablet (90 mg total) by mouth 2 (two) times daily. PLEASE SCHEDULE APPOINTMENT FOR MORE REFILLS [phone] OPTION 2 09/08/24  Yes Rolan Ezra RAMAN, MD  Vitamin D , Ergocalciferol , (DRISDOL) 1.25 MG (50000 UNIT) CAPS capsule Take 50,000 Units by mouth once a week. 10/13/24  Yes [provider]    Total care time: 40 minutes   Care time was exclusive of separately billable procedures and treating other patients.  Care was necessary to treat or prevent imminent or life-threatening deterioration.   Care was time spent personally by me on the following activities: development of treatment plan with patient and/or surrogate as well as nursing, discussions with consultants, evaluation of patient's response to treatment, examination of patient, obtaining history from patient or surrogate, ordering and performing treatments and interventions, ordering and review of laboratory studies, ordering and review of radiographic studies and pulse oximetry.   Sammi JONETTA Fredericks, MD Pulmonary, Critical Care and Sleep Attending.   12/02/2024,  2:31 PM            [1]  Allergies Allergen Reactions   Penicillins Nausea And Vomiting    Fever    "

## 2024-12-02 NOTE — Progress Notes (Signed)
 [facility] Progress Note  Patient: Whitney May    FMW:[id number]  DOA: 11/25/2024     Date of Service: the patient was seen and examined on 12/02/2024  Chief Complaint  Patient presents with   Shortness of Breath   Brief hospital course: 64 y.o. 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  She denies any fevers or chills, chest pain, nausea, vomiting, diarrhea.   In the ED, the patient was afebrile and hemodynamically stable albeit with soft blood pressures.  Oxygen  saturation was 92-94% on 2 L.  WBC 32.9, hemoglobin 14.4, platelet 344.  CTA chest was negative for PE but showed a moderate right pleural effusion that was slightly loculated.  There was a partial consolidation of the right lower lobe.  The patient was started on ceftriaxone  and azithromycin .  Thoracocentesis was ordered.   Pleural fluid was turbid and showed 2025 WBC. CBC showed rising WBC count up to 35K.  Due to concern for empyema, possibly requiring invasive intervention, pt was transferred to [facility]    Assessment and Plan:  # Sepsis secondary to community-acquired pneumonia, POA Moderate-sized loculated right pleural effusion. -Presented with leukocytosis, 33K, tachypnea 28, pneumonia  -Follow peripheral blood cultures x 2. -Lactic acid peaked  1.9 -initially empiric IV antibiotics Rocephin  and azithromycin >>ceftriaxone  + doxy -11/26/24 diagnostic Thoracocentesis--WBC 2025 - follow pleural fluid culture--neg to date - concerned about underlying empyema with increasing WBC up to 35 K--may need chest tube 1/24 Mucinex  600 mg p.o. twice daily,  Robitussin DM as needed D/w pulmonary, patient may need chest tube but she is on DAPT, need to get in touch with cardiology to stop Brilinta  1/24 discontinued aspirin  and Brilinta  after a.m. dose by pulmonary, patient may need chest tube, which will be done after 2 days    # Moderate-sized loculated right pleural effusion, POA. --11/26/24 diagnostic Thoracocentesis -Follow cultures--neg - WBC 2025 (35 N, 54 Eos, 9 mono) - gram stain--no organism - concerned about underlying empyema with rising WBC - pulmonary consult as discussed above 1/24 discontinued aspirin  and Brilinta  after a.m. dose by pulmonary, patient may need chest tube 1/26 d/w PCCM, hold Lovenox  tomorrow and chest tube will be inserted tomorrow a.m. 1/27 s/p right chest tube attempted by pulm, unsuccessful, recommended CT-guided chest tube insertion by IR IR consulted, recommended CT chest without contrast    # Acute respiratory failure with hypoxia # COPD exacerbation -Presented with tachypnea and oxygen  saturation 90% on room air. -Stable on 2 L nasal cannula -Wean as tolerated for saturation greater than 92% 1/25 patient significantly wheezing, started Brovana  and Pulmicort  nebulizer twice daily. S/p Solu-Medrol  80 mg IV x one ose, followed by prednisone  40 mg p.o. daily x 1 and 20 mg pod x 2 doses  1/26 patient could not handle high-dose steroids, having anxiety, so decreased dose of prednisone      # Hypotension -review of records shows pt has had soft BPs in 90s -s/p judicious fluids -added midodrine >>improving   # Chronic HFrEF Ischemic cardiomyopathy -Clinically euvolemic/compensated -Echo 07/04/2024 revealed LVEF 25 to 30% with left ventricle global hypokinesis.  Pericardial effusion anterior to the right ventricle. -Daily weights   # Coronary artery disease status post PCI with stent  placement No reports of chest pain. Resume home aspirin , Brilinta , and Crestor . Resume home statin -continue ranexa    #  Ischemic cardiomyopathy status post ICD insertion with RV ICD microperforation Follows with cardiology Resume home regimen   # Left lower extremity edema and pain behind the knee, POA Left lower extremity ultrasound--neg DVT Pain control.   # Hypothyroidism Resume home levothyroxine .   # Chronic LBBB Resume home regimen.   # Hyperlipidemia Resume home statin   # Current tobacco user Counseled on the importance of complete tobacco use. -nicoderm patch -Was previously smoking 1 pack/day, cut down a few days ago to 5 cigarettes/day this past week     Body mass index is 26.01 kg/m.  Interventions:  Diet: Heart healthy diet DVT Prophylaxis: Subcutaneous Lovenox    Advance goals of care discussion: Full code  Family Communication: family was not present at bedside, at the time of interview.  The pt provided permission to discuss medical plan with the family. Opportunity was given to ask question and all questions were answered satisfactorily.   Disposition:  Pt is from Home, admitted with pneumonia with right pleural effusion, still has effusion, PCCM following, on IV antibiotics, which precludes a safe discharge. Discharge to home, when stable and cleared by pulmonary.  Subjective: No significant events overnight.  Patient still has mild cough with phlegm production, overall feeling better. Patient is aware and agreed for chest tube insertion.    Physical Exam: General: NAD, lying comfortably Appear in no distress, affect appropriate Eyes: PERRLA ENT: Oral Mucosa Clear, moist  Neck: no JVD,  Cardiovascular: S1 and S2 Present, no Murmur,  Respiratory: good air entry bilaterally, mild bilateral crackles, and minimal wheezes. Abdomen: Bowel Sound present, Soft and no tenderness,  Skin: no rashes Extremities: no Pedal edema, no calf tenderness Neurologic: without any new focal findings Gait not checked due to patient safety concerns  Vitals:   12/02/24 1410 12/02/24  1420 12/02/24 1430 12/02/24 1440  BP: (!) 111/58 109/65 112/75 107/65  Pulse: 73 77 81 74  Resp: 16 (!) 22 16 19   Temp:      TempSrc:      SpO2: 94% 95% 95% 95%  Weight:      Height:        Intake/Output Summary (Last 24 hours) at 12/02/2024 1509 Last data filed at 12/01/2024 2030 Gross per 24 hour  Intake 400 ml  Output --  Net 400 ml   Filed Weights   12/01/24 0500 12/02/24 0500 12/02/24 1310  Weight: 63.2 kg 64.5 kg 64.5 kg    Data Reviewed: I have personally reviewed and interpreted daily labs, tele strips, imagings as discussed above. I reviewed all nursing notes, pharmacy notes, vitals, pertinent old records I have discussed plan of care as described above with RN and patient/family.  CBC: Recent Labs  Lab 11/25/24 1848 11/26/24 0454 11/27/24 0407 11/28/24 0425 11/29/24 9062 11/30/24 0658 12/01/24 0037 12/02/24 0501  WBC 32.9*   < > 35.0* 19.3* 20.6* 22.5* 23.2* 26.9*  NEUTROABS 25.3*  --  31.6*  --  14.0*  --   --   --   HGB 14.4   < > 10.9* 12.4 13.5 14.3 12.8 12.1  HCT 44.6   < > 33.6* 40.4 42.2 44.1 39.8 37.9  MCV 83.7   < > 84.4 86.9 84.6 83.8 83.6 84.8  PLT 344   < > 324 354 341 345 323 337   < > = values in this interval not displayed.  Basic Metabolic Panel: Recent Labs  Lab 11/26/24 0454 11/27/24 0407 11/28/24 0425 11/29/24 0937 11/30/24 0658 12/01/24 0037 12/02/24 0501  NA 136 137 140 135 138 133* 137  K 3.9 3.6 4.9 4.2 4.9 4.3 4.3  CL 98 101 103 99 99 95* 101  CO2 26 26 28 27 30 28 28   GLUCOSE 149* 164* 73 89 89 227* 103*  BUN 15 15 13 10 11 22 16   CREATININE 1.09* 1.03* 1.00 0.84 1.14* 1.17* 0.89  CALCIUM  8.7* 8.5* 8.9 8.7* 9.5 8.7* 8.7*  MG 2.2 2.4 2.5* 1.8 2.1  --  2.2  PHOS 4.2  --   --   --  3.8  --  3.7    Studies: No results found.   Scheduled Meds:  calcium  carbonate  1 tablet Oral Daily   doxycycline   100 mg Oral Q12H   [START ON 12/03/2024] enoxaparin  (LOVENOX ) injection  40 mg Subcutaneous Q24H   fluticasone   furoate-vilanterol  1 puff Inhalation Daily   guaiFENesin   600 mg Oral BID   ipratropium-albuterol   3 mL Nebulization TID   levothyroxine   100 mcg Oral Q0600   melatonin  6 mg Oral QHS   midodrine   5 mg Oral TID WC   nicotine   14 mg Transdermal Daily   predniSONE   20 mg Oral Q breakfast   ranolazine   500 mg Oral BID   rosuvastatin   40 mg Oral Daily   Continuous Infusions:  cefTRIAXone  (ROCEPHIN )  IV 2 g (12/02/24 0918)   PRN Meds: acetaminophen , artificial tears, guaiFENesin -dextromethorphan , ipratropium-albuterol , LORazepam , melatonin, polyethylene glycol, prochlorperazine   Time spent: 40 minutes  Author: ELVAN SOR. MD [facility] 12/02/2024 3:09 PM  To reach On-call, see care teams to locate the attending and reach out to them via [email or web address]. If 7PM-7AM, please contact night-coverage If you still have difficulty reaching the attending provider, please page the [facility] (Director on Call) for [facility] on amion for assistance.

## 2024-12-02 NOTE — Telephone Encounter (Signed)
 Pharmacy Patient Advocate Encounter  Insurance verification completed.    The patient is insured through [facility] [place].     Ran test claim for Breo Ellipta  and it requires trial and failure of preferred meds: Advair,Dulera and Symbicort.   This test claim was processed through [facility]- copay amounts may vary at other pharmacies due to pharmacy/plan contracts, or as the patient moves through the different stages of their insurance plan.

## 2024-12-03 ENCOUNTER — Encounter (HOSPITAL_COMMUNITY): Payer: Self-pay

## 2024-12-03 ENCOUNTER — Inpatient Hospital Stay (HOSPITAL_COMMUNITY)

## 2024-12-03 DIAGNOSIS — D7218 Eosinophilia in diseases classified elsewhere: Secondary | ICD-10-CM

## 2024-12-03 DIAGNOSIS — J918 Pleural effusion in other conditions classified elsewhere: Secondary | ICD-10-CM

## 2024-12-03 DIAGNOSIS — Z720 Tobacco use: Secondary | ICD-10-CM | POA: Diagnosis not present

## 2024-12-03 DIAGNOSIS — J9601 Acute respiratory failure with hypoxia: Secondary | ICD-10-CM | POA: Diagnosis not present

## 2024-12-03 DIAGNOSIS — B9689 Other specified bacterial agents as the cause of diseases classified elsewhere: Secondary | ICD-10-CM | POA: Diagnosis not present

## 2024-12-03 DIAGNOSIS — J441 Chronic obstructive pulmonary disease with (acute) exacerbation: Secondary | ICD-10-CM | POA: Diagnosis not present

## 2024-12-03 DIAGNOSIS — J189 Pneumonia, unspecified organism: Secondary | ICD-10-CM | POA: Diagnosis not present

## 2024-12-03 DIAGNOSIS — J90 Pleural effusion, not elsewhere classified: Secondary | ICD-10-CM | POA: Diagnosis not present

## 2024-12-03 LAB — CBC WITH DIFFERENTIAL/PLATELET
Abs Immature Granulocytes: 1.1 10*3/uL — ABNORMAL HIGH (ref 0.00–0.07)
Band Neutrophils: 2 %
Basophils Absolute: 0 10*3/uL (ref 0.0–0.1)
Basophils Relative: 0 %
Eosinophils Absolute: 0 10*3/uL (ref 0.0–0.5)
Eosinophils Relative: 0 %
HCT: 39.7 % (ref 36.0–46.0)
Hemoglobin: 12.5 g/dL (ref 12.0–15.0)
Lymphocytes Relative: 6 %
Lymphs Abs: 1.3 10*3/uL (ref 0.7–4.0)
MCH: 26.9 pg (ref 26.0–34.0)
MCHC: 31.5 g/dL (ref 30.0–36.0)
MCV: 85.6 fL (ref 80.0–100.0)
Metamyelocytes Relative: 3 %
Monocytes Absolute: 1.5 10*3/uL — ABNORMAL HIGH (ref 0.1–1.0)
Monocytes Relative: 7 %
Myelocytes: 2 %
Neutro Abs: 17.3 10*3/uL — ABNORMAL HIGH (ref 1.7–7.7)
Neutrophils Relative %: 80 %
Platelets: 368 10*3/uL (ref 150–400)
RBC: 4.64 MIL/uL (ref 3.87–5.11)
RDW: 15.4 % (ref 11.5–15.5)
WBC: 21.1 10*3/uL — ABNORMAL HIGH (ref 4.0–10.5)
nRBC: 0 % (ref 0.0–0.2)

## 2024-12-03 LAB — CREATININE, SERUM
Creatinine, Ser: 0.99 mg/dL (ref 0.44–1.00)
GFR, Estimated: >60 mL/min

## 2024-12-03 LAB — STRONGYLOIDES, AB, IGG: Strongyloides, Ab, IgG: NEGATIVE

## 2024-12-03 MED ORDER — HYDROXYZINE HCL 25 MG PO TABS: 50.0000 mg | ORAL_TABLET | Freq: Once | ORAL | Status: DC

## 2024-12-03 MED ORDER — NICOTINE 21 MG/24HR TD PT24: 21.0000 mg | MEDICATED_PATCH | Freq: Every day | TRANSDERMAL | Status: DC

## 2024-12-03 MED ORDER — MIDAZOLAM HCL 2 MG/2ML IJ SOLN
INTRAMUSCULAR | Status: AC
  Filled 2024-12-03: qty 4

## 2024-12-03 MED ORDER — FENTANYL CITRATE (PF) 100 MCG/2ML IJ SOLN
INTRAMUSCULAR | Status: AC
  Filled 2024-12-03: qty 4

## 2024-12-03 MED ORDER — HYDROXYZINE HCL 25 MG PO TABS
25.0000 mg | ORAL_TABLET | ORAL | Status: AC
  Administered 2024-12-03: 25 mg via ORAL
  Filled 2024-12-03: qty 1

## 2024-12-03 MED ORDER — LIDOCAINE 1 % OPTIME INJ - NO CHARGE
10.0000 mL | Freq: Once | INTRAMUSCULAR | Status: AC
  Filled 2024-12-03: qty 10

## 2024-12-03 MED ORDER — NICOTINE 21 MG/24HR TD PT24
21.0000 mg | MEDICATED_PATCH | Freq: Every day | TRANSDERMAL | Status: AC
  Administered 2024-12-04 – 2024-12-12 (×9): 21 mg via TRANSDERMAL
  Filled 2024-12-03 (×9): qty 1

## 2024-12-03 NOTE — Progress Notes (Signed)
 " PROGRESS NOTE    Whitney May  FMW:[id number] DOB: 10-05-1961 DOA: 11/25/2024 PCP: Alston Silvio BROCKS, FNP    Brief Narrative:  64 year old with history of chronic left bundle branch block, chronic diastolic dysfunction, coronary artery disease status post PCI and stenting on aspirin  and Brilinta , Ranexa , ischemic cardiomyopathy status post ICD, history of microperforation due to ICD, hyperlipidemia, hypothyroidism and ongoing smoker presented with 2 days of shortness of breath, cough and pleuritic chest pain.  In the emergency room afebrile.  Hemodynamically stable.  92% on 2 L oxygen .  WBC count 32.9.  CT angiogram of the chest was negative for PE but showed moderate right pleural effusion that was slightly loculated.  Patient was started on antibiotics.  Admitted with thoracentesis, pulmonary following.  Subjective: Patient seen and examined.  Afebrile overnight.  She feels overall she is getting better.  Coughing on deep breathing.  Denies any significant chest pain or discomfort.  Feels hungry as we kept her n.p.o.  Patient has not been doing any chest pressure therapy or incentive spirometry since admission about a week ago. We discussed CT scan findings and discussing with IR for possible drain.  Patient agreed.  Morning WBC count is pending.   Assessment & Plan:   Sepsis present on admission secondary to community-acquired pneumonia with loculated right pleural effusion suspected empyema: Presented with tachycardia, leukocytosis and tachypnea. Blood cultures negative.  Lactic acid normal. Procalcitonin less than 0.1. Initially on Rocephin  and azithromycin , currently on Rocephin  and doxycycline . Initial pleural fluid analysis-no growth so far. WBC was trending up, pulmonary consulted.  Given 5 days of break from aspirin  and Brilinta -bedside thoracentesis attempted and was not successful.  Repeat fluid cultures no growth so far. IR consulted for image guided chest tube placement  today. Start chest physiotherapy, incentive spirometry, deep breathing exercises, sputum induction, mucolytic's and bronchodilators. Supplemental oxygen  to keep saturations more than 90%.  Chronic systolic heart failure due to ischemic cardiomyopathy,  Coronary artery disease status post stent on aspirin  and Brilinta  Status post ICD Chronic left bundle branch block  Cardiovascular she has remained stable. Off DAPT for procedure since 1/24.  Remains stable so far. Patient continued on statin, Ranexa ,  Hypothyroidism: On Synthroid .  Continue.  Smoker: Counseled to quit.  On nicotine  patch.     DVT prophylaxis: enoxaparin  (LOVENOX ) injection 40 mg Start: 12/03/24 1800   Code Status: Full code Family Communication: None at the bedside Disposition Plan: Status is: Inpatient Remains inpatient appropriate because: IV antibiotics, inpatient procedures     Consultants:  Pulmonary IR  Procedures:  Diagnostic pleural tap at the bedside  Antimicrobials:  Rocephin  doxycycline  1/21     Objective: Vitals:   12/03/24 0024 12/03/24 0431 12/03/24 0757 12/03/24 0815  BP: (!) 94/54 (!) 109/56 124/75   Pulse: 76 (!) 59 64 64  Resp: 17 17 18 18   Temp: 97.6 F (36.4 C) (!) 97.5 F (36.4 C) 98.1 F (36.7 C)   TempSrc:      SpO2: 97% 98% 96% 96%  Weight:      Height:        Intake/Output Summary (Last 24 hours) at 12/03/2024 0947 Last data filed at 12/02/2024 2115 Gross per 24 hour  Intake 200 ml  Output --  Net 200 ml   Filed Weights   12/01/24 0500 12/02/24 0500 12/02/24 1310  Weight: 63.2 kg 64.5 kg 64.5 kg    Examination:  General exam: Appears calm and comfortable.  Frail.  Not in any distress.  Respiratory system: Clear to auscultation. Respiratory effort normal.  Coughs on deep breathing.  Not in distress.  No accessory muscle use.  Mostly on room air. Cardiovascular system: S1 & S2 heard, RRR.  AICD present left precordium.  No pedal edema. Gastrointestinal  system: Abdomen is nondistended, soft and nontender. No organomegaly or masses felt. Normal bowel sounds heard.    Data Reviewed: I have personally reviewed following labs and imaging studies  CBC: Recent Labs  Lab 11/27/24 0407 11/28/24 0425 11/29/24 0937 11/30/24 0658 12/01/24 0037 12/02/24 0501  WBC 35.0* 19.3* 20.6* 22.5* 23.2* 26.9*  NEUTROABS 31.6*  --  14.0*  --   --   --   HGB 10.9* 12.4 13.5 14.3 12.8 12.1  HCT 33.6* 40.4 42.2 44.1 39.8 37.9  MCV 84.4 86.9 84.6 83.8 83.6 84.8  PLT 324 354 341 345 323 337   Basic Metabolic Panel: Recent Labs  Lab 11/27/24 0407 11/28/24 0425 11/29/24 0937 11/30/24 0658 12/01/24 0037 12/02/24 0501 12/03/24 0703  NA 137 140 135 138 133* 137  --   K 3.6 4.9 4.2 4.9 4.3 4.3  --   CL 101 103 99 99 95* 101  --   CO2 26 28 27 30 28 28   --   GLUCOSE 164* 73 89 89 227* 103*  --   BUN 15 13 10 11 22 16   --   CREATININE 1.03* 1.00 0.84 1.14* 1.17* 0.89 0.99  CALCIUM  8.5* 8.9 8.7* 9.5 8.7* 8.7*  --   MG 2.4 2.5* 1.8 2.1  --  2.2  --   PHOS  --   --   --  3.8  --  3.7  --    GFR: Estimated Creatinine Clearance: 52.7 mL/min (by C-G formula based on SCr of 0.99 mg/dL). Liver Function Tests: Recent Labs  Lab 11/27/24 0407  AST 33  ALT 20  ALKPHOS 111  BILITOT 0.2  PROT 5.8*  ALBUMIN 2.9*   No results for input(s): LIPASE, AMYLASE in the last 168 hours. No results for input(s): AMMONIA in the last 168 hours. Coagulation Profile: No results for input(s): INR, PROTIME in the last 168 hours. Cardiac Enzymes: No results for input(s): CKTOTAL, CKMB, CKMBINDEX, TROPONINI in the last 168 hours. BNP (last 3 results) No results for input(s): PROBNP in the last 8760 hours. HbA1C: No results for input(s): HGBA1C in the last 72 hours. CBG: No results for input(s): GLUCAP in the last 168 hours. Lipid Profile: No results for input(s): CHOL, HDL, LDLCALC, TRIG, CHOLHDL, LDLDIRECT in the last 72  hours. Thyroid  Function Tests: No results for input(s): TSH, T4TOTAL, FREET4, T3FREE, THYROIDAB in the last 72 hours. Anemia Panel: No results for input(s): VITAMINB12, FOLATE, FERRITIN, TIBC, IRON, RETICCTPCT in the last 72 hours. Sepsis Labs: Recent Labs  Lab 11/27/24 0407 12/02/24 0501  PROCALCITON 0.26 <0.10    Recent Results (from the past 240 hours)  Culture, blood (routine x 2)     Status: None   Collection Time: 11/25/24  7:14 PM   Specimen: BLOOD  Result Value Ref Range Status   Specimen Description BLOOD LEFT ANTECUBITAL  Final   Special Requests   Final    BOTTLES DRAWN AEROBIC AND ANAEROBIC Blood Culture results may not be optimal due to an inadequate volume of blood received in culture bottles   Culture   Final    NO GROWTH 5 DAYS Performed at [facility], [address], [place], [place] [address]    Report Status 11/30/2024 FINAL  Final  Culture, blood (routine x 2)     Status: None   Collection Time: 11/25/24  7:18 PM   Specimen: BLOOD  Result Value Ref Range Status   Specimen Description BLOOD RIGHT ANTECUBITAL  Final   Special Requests   Final    BOTTLES DRAWN AEROBIC AND ANAEROBIC Blood Culture results may not be optimal due to an inadequate volume of blood received in culture bottles   Culture   Final    NO GROWTH 5 DAYS Performed at [facility], [address], [place], [place] [address]    Report Status 11/30/2024 FINAL  Final  Resp panel by RT-PCR (RSV, Flu A&B, Covid) Anterior Nasal Swab     Status: None   Collection Time: 11/25/24  7:22 PM   Specimen: Anterior Nasal Swab  Result Value Ref Range Status   SARS Coronavirus 2 by RT PCR NEGATIVE NEGATIVE Final    Comment: (NOTE) SARS-CoV-2 target nucleic acids are NOT DETECTED.  The SARS-CoV-2 RNA is generally detectable in upper respiratory specimens during the acute phase of infection. The lowest concentration of SARS-CoV-2 viral copies this assay can detect is 138  copies/mL. A negative result does not preclude SARS-Cov-2 infection and should not be used as the sole basis for treatment or other patient management decisions. A negative result may occur with  improper specimen collection/handling, submission of specimen other than nasopharyngeal swab, presence of viral mutation(s) within the areas targeted by this assay, and inadequate number of viral copies(<138 copies/mL). A negative result must be combined with clinical observations, patient history, and epidemiological information. The expected result is Negative.  Fact Sheet for Patients:  bloggercourse.com  Fact Sheet for Healthcare Providers:  seriousbroker.it  This test is no t yet approved or cleared by the [place]  FDA and  has been authorized for detection and/or diagnosis of SARS-CoV-2 by FDA under an Emergency Use Authorization (EUA). This EUA will remain  in effect (meaning this test can be used) for the duration of the COVID-19 declaration under Section 564(b)(1) of the Act, 21 U.S.C.section 360bbb-3(b)(1), unless the authorization is terminated  or revoked sooner.       Influenza A by PCR NEGATIVE NEGATIVE Final   Influenza B by PCR NEGATIVE NEGATIVE Final    Comment: (NOTE) The Xpert Xpress SARS-CoV-2/FLU/RSV plus assay is intended as an aid in the diagnosis of influenza from Nasopharyngeal swab specimens and should not be used as a sole basis for treatment. Nasal washings and aspirates are unacceptable for Xpert Xpress SARS-CoV-2/FLU/RSV testing.  Fact Sheet for Patients: bloggercourse.com  Fact Sheet for Healthcare Providers: seriousbroker.it  This test is not yet approved or cleared by the [place]  FDA and has been authorized for detection and/or diagnosis of SARS-CoV-2 by FDA under an Emergency Use Authorization (EUA). This EUA will remain in effect (meaning  this test can be used) for the duration of the COVID-19 declaration under Section 564(b)(1) of the Act, 21 U.S.C. section 360bbb-3(b)(1), unless the authorization is terminated or revoked.     Resp Syncytial Virus by PCR NEGATIVE NEGATIVE Final    Comment: (NOTE) Fact Sheet for Patients: bloggercourse.com  Fact Sheet for Healthcare Providers: seriousbroker.it  This test is not yet approved or cleared by the [place]  FDA and has been authorized for detection and/or diagnosis of SARS-CoV-2 by FDA under an Emergency Use Authorization (EUA). This EUA will remain in effect (meaning this test can be used) for the duration of the COVID-19 declaration under Section 564(b)(1) of the  Act, 21 U.S.C. section 360bbb-3(b)(1), unless the authorization is terminated or revoked.  Performed at [facility], [address] [place], [place] [address]   MRSA Next Gen by PCR, Nasal     Status: None   Collection Time: 11/26/24  3:40 AM   Specimen: Nasal Mucosa; Nasal Swab  Result Value Ref Range Status   MRSA by PCR Next Gen NOT DETECTED NOT DETECTED Final    Comment: (NOTE) The GeneXpert MRSA Assay (FDA approved for NASAL specimens only), is one component of a comprehensive MRSA colonization surveillance program. It is not intended to diagnose MRSA infection nor to guide or monitor treatment for MRSA infections. Test performance is not FDA approved in patients less than 14 years old. Performed at [facility], [address] [place], [place] [address]   Gram stain     Status: None   Collection Time: 11/26/24 10:00 AM   Specimen: Pleura  Result Value Ref Range Status   Specimen Description PLEURAL  Final   Special Requests PLEURAL  Final   Gram Stain   Final    WBC PRESENT, PREDOMINANTLY PMN NO ORGANISMS SEEN CYTOSPIN SMEAR Performed at [facility], [address], [place], [place] [address]    Report Status 11/26/2024 FINAL  Final   Culture, body fluid w Gram Stain-bottle     Status: None   Collection Time: 11/26/24 10:00 AM   Specimen: Pleura  Result Value Ref Range Status   Specimen Description PLEURAL  Final   Special Requests PLEURAL  Final   Culture   Final    NO GROWTH 5 DAYS Performed at [facility], [address] [place], [place] [address]    Report Status 12/01/2024 FINAL  Final  Body fluid culture w Gram Stain     Status: None (Preliminary result)   Collection Time: 12/02/24  2:01 PM   Specimen: Path fluid; Body Fluid  Result Value Ref Range Status   Specimen Description FLUID  Final   Special Requests LUNG,RIGHT LOWER LOBE  Final   Gram Stain   Final    NO WBC SEEN NO ORGANISMS SEEN Performed at [facility], [address] [place], [place] [address]    Culture PENDING  Incomplete   Report Status PENDING  Incomplete         Radiology Studies: CT CHEST WO CONTRAST Result Date: 12/02/2024 CLINICAL DATA:  Pleural effusion EXAM: CT CHEST WITHOUT CONTRAST TECHNIQUE: Multidetector CT imaging of the chest was performed following the standard protocol without IV contrast. RADIATION DOSE REDUCTION: This exam was performed according to the departmental dose-optimization program which includes automated exposure control, adjustment of the mA and/or kV according to patient size and/or use of iterative reconstruction technique. COMPARISON:  Chest x-ray 12/02/2024, chest CT 11/25/2024 FINDINGS: Cardiovascular: Limited assessment without intravenous contrast. Mild atherosclerosis. No aneurysm. Left-sided cardiac pacing device. Cardiomegaly. No pericardial effusion. Coronary vascular calcification. Mediastinum/Nodes: Patent trachea. No suspicious thyroid  mass. Decreased mediastinal adenopathy compared to prior. Borderline right paratracheal node measuring 10 mm, previously 12 mm. Assessment for hilar nodes limited without contrast. Esophagus within normal limits Lungs/Pleura: Emphysema, patient is already  enrolled in a lung cancer CT screening program. Small loculated right pleural effusion, decreased compared to the prior CT. Few small gas locules within the right posterior pleural collection compared to prior. Partial consolidation in the right lower lobe, probable atelectasis. Mild left posterior pleural thickening at the lung base. No new airspace disease. Mild subpleural reticulation. Upper Abdomen: No acute finding Musculoskeletal:  No acute osseous abnormality IMPRESSION: 1. Small loculated right pleural effusion, decreased compared to the prior CT. Few small gas locules within the right posterior pleural collection compared to prior, this could be related to recent thoracentesis versus gas related to infection. Partial consolidation in the right lower lobe, probable atelectasis. 2. Emphysema. 3. Cardiomegaly. 4. Decreased mediastinal adenopathy compared to prior. 5. Aortic atherosclerosis. Aortic Atherosclerosis (ICD10-I70.0) and Emphysema (ICD10-J43.9). Electronically Signed   By: Luke Bun M.D.   On: 12/02/2024 22:59   DG Chest Port 1 View Result Date: 12/02/2024 EXAM: 2 VIEW(S) XRAY OF THE CHEST 12/02/2024 02:26:00 PM COMPARISON: 11/29/2024 CLINICAL HISTORY: Status post thoracentesis. FINDINGS: LINES, TUBES AND DEVICES: Left chest AICD. LUNGS AND PLEURA: Decreased right pleural effusion. Residual opacity in right lower lung. Mildly improved lung base aeration. Left basilar atelectasis. No pneumothorax. HEART AND MEDIASTINUM: No acute abnormality of the cardiac and mediastinal silhouettes. BONES AND SOFT TISSUES: No acute osseous abnormality. IMPRESSION: 1. Decreased right pleural effusion. 2. Residual opacity in right lower lung. 3. Left basilar atelectasis. 4. Mildly improved lung base aeration. Electronically signed by: Elsie Gravely MD 12/02/2024 03:08 PM EST RP Workstation: [id number]        Scheduled Meds:  calcium  carbonate  1 tablet Oral Daily   doxycycline   100 mg Oral Q12H    enoxaparin  (LOVENOX ) injection  40 mg Subcutaneous Q24H   fluticasone  furoate-vilanterol  1 puff Inhalation Daily   guaiFENesin   600 mg Oral BID   levothyroxine   100 mcg Oral Q0600   melatonin  6 mg Oral QHS   midodrine   5 mg Oral TID WC   nicotine   14 mg Transdermal Daily   ranolazine   500 mg Oral BID   rosuvastatin   40 mg Oral Daily   Continuous Infusions:  cefTRIAXone  (ROCEPHIN )  IV 2 g (12/02/24 0918)     LOS: 8 days       Renato Applebaum, MD [facility]   "

## 2024-12-03 NOTE — Progress Notes (Signed)
" ° °  NAME:  Whitney May, MRN:  [id number], DOB:  1961/05/07, LOS: 8 ADMISSION DATE:  11/25/2024, CONSULTATION DATE:  11/29/24 REFERRING MD:  Tat, CHIEF COMPLAINT:  abnormal chest imaging    History of Present Illness:  64 yo F  PMH tobacco use, CAD with prior PCI on DAPT (ASA brilinta ), HFrEF, ICM, s/p ICD, who was admitted to [facility] at [facility] after presenting 1/21 w SOB/cough. Started tx for PNA and underwent thora 1/21 with IR for R pleural effusion.  Fluid was loculated and incompletely drained so transfer was requested to [facility] for chest tube placement.   Pt arrived days 1/24    Pertinent  Medical History  CAD ICM HFrEF S/p ICD   Significant Hospital Events: Including procedures, antibiotic start and stop dates in addition to other pertinent events   1/20 admit to [facility]  1/21 thora w IR 1/22 txf request to [facility] for chest tube 1/24 arrives to [facility].  1/27: Failed attempt at chest tube placement.  Removal of only 5 cc of fluid. CT chest 1. Small loculated right pleural effusion, decreased compared to the prior CT. Few small gas locules within the right posterior pleural collection. Partial consolidation in the right lower lobe, probable atelectasis. Decreased mediastinal adenopathy compared to prior.   Interim History / Subjective:  Still have some chest discomfort in the right chest posterior laterally with cough and deep breath  Objective    Blood pressure (!) 109/56, pulse (!) 59, temperature (!) 97.5 F (36.4 C), resp. rate 17, height 5' 2 (1.575 m), weight 64.5 kg, SpO2 98%.        Intake/Output Summary (Last 24 hours) at 12/03/2024 0745 Last data filed at 12/02/2024 2115 Gross per 24 hour  Intake 680 ml  Output --  Net 680 ml   Filed Weights   12/01/24 0500 12/02/24 0500 12/02/24 1310  Weight: 63.2 kg 64.5 kg 64.5 kg    Examination: General 63 year old female patient lying in bed no acute distress currently HEENT normocephalic atraumatic no jugular venous distention is  appreciated.  Mucous membranes are moist Pulmonary diffuse wheezing, no accessory use.  Slightly diminished right base.  Currently on nasal cannula at 3 L/min speaking in full sentences Cardiac regular rate and rhythm Abdomen soft nontender Extremities warm dry Neuro intact  Resolved problem list   Assessment and Plan  Eosinophil predominant Loculated/Exudative Right pleural effusion   -likely traumatic/ seems less likely complicated parapneumonic. Fungal neg to date. Cytology neg to date but cannot r/u malignancy based on 1 negative pleural sample alone  -Incomplete pleural fluid studies sent.   -attempted POCUS guided CT placement on 27th unsuccessful.  -Brilinta  (received last dose AM on 24th) Plan IR consulted for CT guided CT insertion  Will cont to follow; anticipate she will need pleural lytic therapy for resolution of this loculated pleural collection  F/u Toxo PCR and strongyloidiasis antibody   Bacterial pneumonia: Plan Complete 7d rx abx    Tobacco abuse with acute exacerbation of COPD (clinically)  Plan Completed prednisone  Agree with Breo Ellipta  Probably would benefit from outpatient PFTs Smoking cessation   My time 26 min included: review of most recent records, direct face to face time obtaining history, performing physical exam, developing and documenting plan as well as discussing this plan with the patient and/or care givers.  [id number] on-going, stable,recovering or improving problem >25 min       "

## 2024-12-03 NOTE — Consult Note (Signed)
 "   Chief Complaint: Moderate loculated right pleural effusion secondary to community-acquired pneumonia  Referring Provider(s): * No referring provider recorded for this case * Von Bellis, MD  Supervising Physician: Vanice Revel  Patient Status: [facility] - In-pt  History of Present Illness: Whitney May is a 64 y.o. 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 She presented to the [facility] ED on 11/25/24 with shortness of breath for 2 days with associated coughing with sputum production. Initial CXR revealed moderate loculated right pleural effusion and patchy right lower lobe atelectasis. Initial WBC was 33,000. The patient was started on empiric IV rocephin  and azithromycin  in the ER for CAP secondary to sepsis and then was admitted to the floor for further inpatient management. Last dose of aspirin  was on 11/29/24.  Chest Xray from 11/26/24 showed high loculated moderate to large right pleural effusion. Imaging from 1/24 and 1/27 revealed improvement of the effusion. Chest CT WO contrast from 12/02/24 showed a small, loculated right pleural effusion, decreased compared to the prior CT.   A diagnostic thoracentesis was performed 11/26/24, revealing amber colored fluid. Initial cultures and gram stain were negative, but showed significantly elevated WBC (mainly PMNs), concerning for empyema. A chest tube placement was attempted by PCCM on 12/02/24, and removal of 5cc of fluid was sent for cultures, labs pending. IR was consulted for CT-guided chest tube insertion.   Patient has agreed to move forward with this procedure.  Patient is Full Code  Past Medical History:  Diagnosis Date   Alcohol  use    CAD (coronary artery disease)    Chest pain    Hyperlipidemia, mixed     Hypotension    Hypothyroidism    Ischemic cardiomyopathy    LBBB (left bundle branch block)    MI (myocardial infarction) (HCC)    Tobacco abuse     Past Surgical History:  Procedure Laterality Date   BIV ICD INSERTION CRT-D N/A 05/16/2024   Procedure: BIV ICD INSERTION CRT-D;  Surgeon: Nancey Eulas BRAVO, MD;  Location: [facility] INVASIVE CV LAB;  Service: Cardiovascular;  Laterality: N/A;   CESAREAN SECTION     CORONARY STENT INTERVENTION N/A 03/07/2019   Procedure: CORONARY STENT INTERVENTION;  Surgeon: Wonda Sharper, MD;  Location: [facility] INVASIVE CV LAB;  Service: Cardiovascular;  Laterality: N/A;   CORONARY/GRAFT ACUTE MI REVASCULARIZATION N/A 08/02/2018   Procedure: Coronary/Graft Acute MI Revascularization;  Surgeon: Anner Alm ORN, MD;  Location: [facility] INVASIVE CV LAB;  Service: Cardiovascular;  Laterality: N/A;   CORONARY/GRAFT ACUTE MI REVASCULARIZATION N/A 09/18/2022   Procedure: Coronary/Graft Acute MI Revascularization;  Surgeon: Court Dorn PARAS, MD;  Location: [facility] INVASIVE CV LAB;  Service: Cardiovascular;  Laterality: N/A;   ICD IMPLANT Left    LEAD REVISION/REPAIR N/A 07/03/2024   Procedure: LEAD REVISION/REPAIR;  Surgeon: Inocencio Soyla Lunger, MD;  Location: [facility] INVASIVE CV LAB;  Service: Cardiovascular;  Laterality: N/A;   LEFT HEART CATH AND CORONARY ANGIOGRAPHY N/A 08/02/2018   Procedure: LEFT HEART CATH AND CORONARY ANGIOGRAPHY;  Surgeon: Anner Alm ORN, MD;  Location: [facility] INVASIVE CV LAB;  Service: Cardiovascular;  Laterality: N/A;   LEFT HEART CATH AND CORONARY ANGIOGRAPHY N/A 03/07/2019   Procedure: LEFT HEART CATH AND CORONARY ANGIOGRAPHY;  Surgeon: Cherrie Toribio SAUNDERS, MD;  Location: [facility] INVASIVE CV LAB;  Service: Cardiovascular;  Laterality: N/A;   LEFT HEART CATH AND CORONARY ANGIOGRAPHY N/A 09/18/2022   Procedure: LEFT HEART CATH AND CORONARY ANGIOGRAPHY;  Surgeon: Court Dorn PARAS, MD;  Location: [facility] INVASIVE CV LAB;  Service: Cardiovascular;  Laterality: N/A;   LEFT  HEART CATH AND CORONARY ANGIOGRAPHY N/A 04/16/2023   Procedure: LEFT HEART CATH AND CORONARY ANGIOGRAPHY;  Surgeon: Anner Alm ORN, MD;  Location: [facility] INVASIVE CV LAB;  Service: Cardiovascular;  Laterality: N/A;    Allergies: Penicillins  Medications: Prior to Admission medications  Medication Sig Start Date End Date Taking? Authorizing Provider  acetaminophen  (TYLENOL ) 500 MG tablet Take 1,000 mg by mouth every 6 (six) hours as needed for moderate pain (pain score 4-6).   Yes [provider]  aspirin  81 MG chewable tablet Chew 1 tablet (81 mg total) by mouth daily. 06/08/23  Yes Milford, Harlene HERO, FNP  bisoprolol  (ZEBETA ) 5 MG tablet TAKE 1/2 TABLET BY MOUTH ONCE DAILY 10/16/24  Yes McLean, Dalton S, MD  calcium  carbonate (TUMS - DOSED IN MG ELEMENTAL CALCIUM ) 500 MG chewable tablet Chew 1 tablet by mouth daily as needed for indigestion or heartburn.   Yes [provider]  empagliflozin  (JARDIANCE ) 10 MG TABS tablet TAKE 1 TABLET BY MOUTH DAILY BEFORE BREAKFAST 11/14/24  Yes Rolan Ezra RAMAN, MD  FT NICOTINE  21 MG/24HR patch Place 21 mg onto the skin daily. 10/14/24  Yes [provider]  furosemide  (LASIX ) 20 MG tablet Take 1 tablet (20 mg total) by mouth daily as needed for weight gain of 3 lbs in 24 hours or 5 lbs in a week 04/02/24 11/26/24 Yes Lee, Jordan, NP  isosorbide  mononitrate (IMDUR ) 30 MG 24 hr tablet TAKE 1 TABLET BY MOUTH DAILY 10/16/24  Yes McLean, Dalton S, MD  levothyroxine  (SYNTHROID ) 75 MCG tablet Take 75 mcg by mouth every morning. 11/17/24  Yes [provider]  losartan  (COZAAR ) 25 MG tablet TAKE 1/2 TABLET BY MOUTH DAILY 09/04/24  Yes Rolan Ezra RAMAN, MD  nitroGLYCERIN  (NITROSTAT ) 0.4 MG SL tablet Place 1 tablet (0.4 mg total) under the tongue every 5 (five) minutes x 3 doses as needed for chest pain. 07/05/23  Yes Rolan Ezra RAMAN, MD  ranolazine  (RANEXA ) 500 MG 12 hr tablet Take 1 tablet (500 mg total) by mouth 2 (two) times daily. PLEASE  SCHEDULE APPOINTMENT FOR MORE REFILLS [phone] OPTION 2 11/14/24  Yes Rolan Ezra RAMAN, MD  rosuvastatin  (CRESTOR ) 40 MG tablet Take 1 tablet (40 mg total) by mouth daily. PLEASE SCHEDULE APPOINTMENT FOR MORE REFILLS [phone] OPTION 2 10/27/24  Yes Rolan Ezra RAMAN, MD  ticagrelor  (BRILINTA ) 90 MG TABS tablet Take 1 tablet (90 mg total) by mouth 2 (two) times daily. PLEASE SCHEDULE APPOINTMENT FOR MORE REFILLS [phone] OPTION 2 09/08/24  Yes Rolan Ezra RAMAN, MD  Vitamin D , Ergocalciferol , (DRISDOL) 1.25 MG (50000 UNIT) CAPS capsule Take 50,000 Units by mouth once a week. 10/13/24  Yes [provider]     Family History  Problem Relation Age of Onset   Coronary artery disease Mother    Coronary artery disease Maternal Grandmother    Hypertension Father    Diabetes Father     Social History   Socioeconomic History   Marital status: Widowed    Spouse name: Not on file   Number of children: Not on file   Years of education: Not on file   Highest education level: Not on file  Occupational History   Not on file  Tobacco Use   Smoking  status: Every Day    Current packs/day: 0.00    Average packs/day: 1 pack/day for 22.0 years (22.0 ttl pk-yrs)    Types: Cigarettes    Start date: 08/09/1996    Last attempt to quit: 08/09/2018    Years since quitting: 6.3   Smokeless tobacco: Never  Vaping Use   Vaping status: Never Used  Substance and Sexual Activity   Alcohol  use: Yes    Alcohol /week: 8.0 standard drinks of alcohol     Types: 8 Glasses of wine per week   Drug use: No   Sexual activity: Not on file  Other Topics Concern   Not on file  Social History Narrative   Married   Gets regular exercise   Social Drivers of Health   Tobacco Use: High Risk (12/02/2024)   Patient History    Smoking Tobacco Use: Every Day    Smokeless Tobacco Use: Never    Passive Exposure: Not on file  Financial Resource Strain: Not on file  Food Insecurity: Food Insecurity Present  (11/25/2024)   Epic    Worried About Programme Researcher, Broadcasting/film/video in the Last Year: Sometimes true    [employer] of Food in the Last Year: Sometimes true  Transportation Needs: No Transportation Needs (11/25/2024)   Epic    Lack of Transportation (Medical): No    Lack of Transportation (Non-Medical): No  Physical Activity: Not on file  Stress: Not on file  Social Connections: Socially Isolated (11/25/2024)   Social Connection and Isolation Panel    Frequency of Communication with Friends and Family: Twice a week    Frequency of Social Gatherings with Friends and Family: Never    Attends Religious Services: Never    Database Administrator or Organizations: No    Attends Banker Meetings: Never    Marital Status: Widowed  Depression (PHQ2-9): Not on file  Alcohol  Screen: Not on file  Housing: Unknown (11/25/2024)   Epic    Unable to Pay for Housing in the Last Year: No    Number of Times Moved in the Last Year: 0    Homeless in the Last Year: Patient unable to answer  Utilities: Not At Risk (11/25/2024)   Epic    Threatened with loss of utilities: No  Health Literacy: Not on file     Review of Systems: A 12 point ROS discussed and pertinent positives are indicated in the HPI above.  All other systems are negative.  Review of Systems  Constitutional:  Negative for appetite change and unexpected weight change.  Respiratory:  Positive for cough and shortness of breath.   Cardiovascular:  Positive for chest pain. Negative for leg swelling.  Skin:  Negative for color change, rash and wound.  Psychiatric/Behavioral:  Negative for agitation and confusion.    Vital Signs: BP 124/75 (BP Location: Left Arm)   Pulse 64   Temp 98.1 F (36.7 C)   Resp 18   Ht 5' 2 (1.575 m)   Wt 142 lb 3.2 oz (64.5 kg)   SpO2 96%   BMI 26.01 kg/m     Physical Exam Constitutional:      General: She is not in acute distress. Cardiovascular:     Rate and Rhythm: Normal rate and regular rhythm.      Heart sounds: Normal heart sounds.     Comments: Significant tenderness to pacemaker site Pulmonary:     Effort: No respiratory distress.     Breath sounds: Examination of the  right-lower field reveals decreased breath sounds. Decreased breath sounds present.     Comments: 2L nasal cannula  Skin:    General: Skin is warm and dry.     Findings: No erythema, lesion or rash.  Neurological:     General: No focal deficit present.     Mental Status: She is alert and oriented to person, place, and time.  Psychiatric:        Mood and Affect: Mood normal.        Behavior: Behavior normal.        Thought Content: Thought content normal.    Imaging: CT CHEST WO CONTRAST Result Date: 12/02/2024 CLINICAL DATA:  Pleural effusion EXAM: CT CHEST WITHOUT CONTRAST TECHNIQUE: Multidetector CT imaging of the chest was performed following the standard protocol without IV contrast. RADIATION DOSE REDUCTION: This exam was performed according to the departmental dose-optimization program which includes automated exposure control, adjustment of the mA and/or kV according to patient size and/or use of iterative reconstruction technique. COMPARISON:  Chest x-ray 12/02/2024, chest CT 11/25/2024 FINDINGS: Cardiovascular: Limited assessment without intravenous contrast. Mild atherosclerosis. No aneurysm. Left-sided cardiac pacing device. Cardiomegaly. No pericardial effusion. Coronary vascular calcification. Mediastinum/Nodes: Patent trachea. No suspicious thyroid  mass. Decreased mediastinal adenopathy compared to prior. Borderline right paratracheal node measuring 10 mm, previously 12 mm. Assessment for hilar nodes limited without contrast. Esophagus within normal limits Lungs/Pleura: Emphysema, patient is already enrolled in a lung cancer CT screening program. Small loculated right pleural effusion, decreased compared to the prior CT. Few small gas locules within the right posterior pleural collection compared to  prior. Partial consolidation in the right lower lobe, probable atelectasis. Mild left posterior pleural thickening at the lung base. No new airspace disease. Mild subpleural reticulation. Upper Abdomen: No acute finding Musculoskeletal: No acute osseous abnormality IMPRESSION: 1. Small loculated right pleural effusion, decreased compared to the prior CT. Few small gas locules within the right posterior pleural collection compared to prior, this could be related to recent thoracentesis versus gas related to infection. Partial consolidation in the right lower lobe, probable atelectasis. 2. Emphysema. 3. Cardiomegaly. 4. Decreased mediastinal adenopathy compared to prior. 5. Aortic atherosclerosis. Aortic Atherosclerosis (ICD10-I70.0) and Emphysema (ICD10-J43.9). Electronically Signed   By: Luke Bun M.D.   On: 12/02/2024 22:59   DG Chest Port 1 View Result Date: 12/02/2024 EXAM: 2 VIEW(S) XRAY OF THE CHEST 12/02/2024 02:26:00 PM COMPARISON: 11/29/2024 CLINICAL HISTORY: Status post thoracentesis. FINDINGS: LINES, TUBES AND DEVICES: Left chest AICD. LUNGS AND PLEURA: Decreased right pleural effusion. Residual opacity in right lower lung. Mildly improved lung base aeration. Left basilar atelectasis. No pneumothorax. HEART AND MEDIASTINUM: No acute abnormality of the cardiac and mediastinal silhouettes. BONES AND SOFT TISSUES: No acute osseous abnormality. IMPRESSION: 1. Decreased right pleural effusion. 2. Residual opacity in right lower lung. 3. Left basilar atelectasis. 4. Mildly improved lung base aeration. Electronically signed by: Elsie Gravely MD 12/02/2024 03:08 PM EST RP Workstation: [id number]   DG CHEST PORT 1 VIEW Result Date: 11/29/2024 CLINICAL DATA:  Right-sided pleural effusion EXAM: PORTABLE CHEST 1 VIEW COMPARISON:  Prior chest x-ray 11/26/2024 FINDINGS: Left subclavian approach cardiac rhythm maintenance device remains in unchanged position with leads projecting over the right atrium and  right ventricle. Stable mild cardiomegaly. Improved aeration of the right lung base with decreasing pleural effusion. The pleural effusion is relatively lateral and may be slightly loculated. Persistent hazy and linear right lower lobe airspace opacity favored to reflect atelectasis. The lungs are otherwise clear. No pneumothorax.  Unremarkable osseous structures. IMPRESSION: 1. Improving aeration at the right lung base with decreased pleural effusion and atelectasis. The remaining pleural fluid may be loculated. Electronically Signed   By: Wilkie Lent M.D.   On: 11/29/2024 10:06   US  Venous Img Lower Unilateral Left (DVT) Result Date: 11/26/2024 CLINICAL DATA:  LEFT lower extremity swelling and posterior knee pain. EXAM: LEFT LOWER EXTREMITY VENOUS DOPPLER ULTRASOUND TECHNIQUE: Gray-scale sonography with compression, as well as color and duplex ultrasound, were performed to evaluate the deep venous system(s) from the level of the common femoral vein through the popliteal and proximal calf veins. COMPARISON:  None available FINDINGS: VENOUS Normal compressibility of the common femoral, superficial femoral, and popliteal veins, as well as the visualized calf veins. Visualized portions of profunda femoral vein and great saphenous vein unremarkable. No filling defects to suggest DVT on grayscale or color Doppler imaging. Doppler waveforms show normal direction of venous flow, normal respiratory plasticity and response to augmentation. Limited views of the contralateral common femoral vein are unremarkable. OTHER No Baker's cyst identified. Limitations: none IMPRESSION: No DVT of the left lower extremity. Electronically Signed   By: Aliene Lloyd M.D.   On: 11/26/2024 12:48   DG Chest Portable 1 View Result Date: 11/26/2024 CLINICAL DATA:  Right thoracentesis. EXAM: PORTABLE CHEST 1 VIEW COMPARISON:  11/25/2024 and CT chest 11/25/2024. FINDINGS: Trachea is midline. Pacemaker and ICD lead tips are stable in  position. Heart is enlarged. Minimal improvement in a moderate to large highly loculated pleural effusion at the base of the right hemithorax. No pneumothorax. Compressive atelectasis in the right lung base. Left lung is clear. No pleural fluid. IMPRESSION: 1. Minimal improvement in a high loculated moderate to large right pleural effusion. No pneumothorax. 2. Compressive atelectasis in the right lung base. Electronically Signed   By: Newell Eke M.D.   On: 11/26/2024 10:44   US  THORACENTESIS LEFT ASP PLEURAL SPACE W/IMG GUIDE Result Date: 11/26/2024 INDICATION: 64 year old female. History of CHF, CAD, MI. Endorsing shortness of breath. Found to have a complex right-sided pleural effusion. Request is for therapeutic and diagnostic right-sided thoracentesis. EXAM: ULTRASOUND GUIDED THERAPEUTIC AND DIAGNOSTIC RIGHT-SIDED THORACENTESIS MEDICATIONS: Lidocaine  1% 10 mL COMPLICATIONS: None immediate. PROCEDURE: An ultrasound guided thoracentesis was thoroughly discussed with the patient and questions answered. The benefits, risks, alternatives and complications were also discussed. The patient understands and wishes to proceed with the procedure. Written consent was obtained. Ultrasound was performed to localize and mark an adequate pocket of fluid in the right chest. The area was then prepped and draped in the normal sterile fashion. 1% Lidocaine  was used for local anesthesia. Under ultrasound guidance a 6 Fr Safe-T-Centesis catheter was introduced. Thoracentesis was performed. The catheter was removed and a dressing applied. FINDINGS: A total of approximately 90 mL of amber colored fluid was removed. Samples were sent to the laboratory as requested by the clinical team. Visualization of the pleural effusion shows loculated phlegmonous collection. IMPRESSION: Successful ultrasound guided therapeutic and diagnostic right-sided thoracentesis yielding 90 mL of amber colored pleural fluid. Performed by Delon Beagle NP Electronically Signed   By: Ester Sides M.D.   On: 11/26/2024 10:14   CT Angio Chest PE W and/or Wo Contrast Result Date: 11/25/2024 CLINICAL DATA:  Shortness of breath EXAM: CT ANGIOGRAPHY CHEST WITH CONTRAST TECHNIQUE: Multidetector CT imaging of the chest was performed using the standard protocol during bolus administration of intravenous contrast. Multiplanar CT image reconstructions and MIPs were obtained to evaluate the vascular anatomy. RADIATION  DOSE REDUCTION: This exam was performed according to the departmental dose-optimization program which includes automated exposure control, adjustment of the mA and/or kV according to patient size and/or use of iterative reconstruction technique. CONTRAST:  75mL OMNIPAQUE  IOHEXOL  350 MG/ML SOLN COMPARISON:  Chest x-ray 11/25/2024, chest CT 11/29/2023 FINDINGS: Cardiovascular: Satisfactory opacification of the pulmonary arteries to the segmental level. No evidence of pulmonary embolism. Mild aortic atherosclerosis. Ectatic ascending aorta up to 3.6 cm. Coronary vascular calcification. Cardiomegaly. No pericardial effusion. Left-sided pacing device with multiple intracardiac leads. Mediastinum/Nodes: Patent trachea. No thyroid  mass. Enlarged mediastinal lymph nodes. Superior right paratracheal node measuring 12 mm. AP window lymph nodes measuring up to 10 mm. Precarinal nodes measuring up to 10 mm. Subcarinal nodes measuring up to 16 mm. Right hilar nodes measuring up to 12 mm. Esophagus unremarkable Lungs/Pleura: Moderate slightly loculated appearing right pleural effusion. Small amount of loculated fluid along the right major fissure. Mild emphysema. 3 mm left apical lung nodule on series 12, image 27. This is unchanged. Partial consolidation in the right lower lobe Upper Abdomen: No acute finding Musculoskeletal: No acute osseous abnormality Review of the MIP images confirms the above findings. IMPRESSION: 1. Negative for acute pulmonary embolus. 2.  Moderate slightly loculated appearing right pleural effusion. Partial consolidation in the right lower lobe, atelectasis versus pneumonia. 3. Cardiomegaly. 4. Mild mediastinal and right hilar adenopathy, nonspecific, but potentially reactive. Attention on chest CT follow-up imaging. 5. Emphysema. Pulmonary emphysema is an independent risk factor for lung cancer. The patient is already enrolled in lung cancer chest CT screening program Aortic Atherosclerosis (ICD10-I70.0) and Emphysema (ICD10-J43.9). Electronically Signed   By: Luke Bun M.D.   On: 11/25/2024 21:47   DG Chest Port 1 View Result Date: 11/25/2024 EXAM: 2 Views XRAY of the Chest 11/25/2024 06:54:17 PM COMPARISON: None available. CLINICAL HISTORY: FINDINGS: LINES, TUBES AND DEVICES: Left triple lead cardiac device in place. LUNGS AND PLEURA: Moderate loculated right pleural effusion. There is patchy atelectasis/airspace disease in the right lower lobe. HEART AND MEDIASTINUM: No acute abnormality of the cardiac and mediastinal silhouettes. BONES AND SOFT TISSUES: No acute osseous abnormality. IMPRESSION: 1. Moderate loculated right pleural effusion. 2. Patchy right lower lobe atelectasis or airspace disease. Electronically signed by: Greig Pique MD 11/25/2024 07:02 PM EST RP Workstation: [id number]   CUP PACEART INCLINIC DEVICE CHECK Result Date: 11/13/2024 in-clinic CRT-D (multi-lead) check. Presenting Rhythm: _AS/VP__ . Routine testing was performed. Thresholds, sensing, impedance trend were stable. HF diagnostics are stable. No treated arrhythmia. Patient BiV pacing _>99_ % of the time. Estimated longevity __7.8 years__ . Pt enrolled in remote follow-up. EKG opt, programmed BIiVe to LV only (with LB area pacing lead in LV port) see OV note for full discussion RU   Labs:  CBC: Recent Labs    11/29/24 0937 11/30/24 0658 12/01/24 0037 12/02/24 0501  WBC 20.6* 22.5* 23.2* 26.9*  HGB 13.5 14.3 12.8 12.1  HCT 42.2 44.1 39.8 37.9  PLT  341 345 323 337    COAGS: Recent Labs    11/25/24 1848  INR 1.2    BMP: Recent Labs    11/29/24 0937 11/30/24 0658 12/01/24 0037 12/02/24 0501 12/03/24 0703  NA 135 138 133* 137  --   K 4.2 4.9 4.3 4.3  --   CL 99 99 95* 101  --   CO2 27 30 28 28   --   GLUCOSE 89 89 227* 103*  --   BUN 10 11 22 16   --   CALCIUM   8.7* 9.5 8.7* 8.7*  --   CREATININE 0.84 1.14* 1.17* 0.89 0.99  GFRNONAA >60 55* 53* >60 >60    LIVER FUNCTION TESTS: Recent Labs    07/03/24 1303 11/25/24 1848 11/27/24 0407  BILITOT 0.9 0.9 0.2  AST 21 13* 33  ALT 21 <5 20  ALKPHOS 88 103 111  PROT 7.1 7.2 5.8*  ALBUMIN 3.5 3.5 2.9*    TUMOR MARKERS: No results for input(s): AFPTM, CEA, CA199, CHROMGRNA in the last 8760 hours.  Assessment and Plan:  Moderate loculated right pleural effusion secondary to community-acquired pneumonia.   Most recent CT chest showed small loculated right pleural effusion. Most recent labs consistent with leukocytosis of 26.9, CRP 1.5. Platelets 337, INR 1.2. IR consulted to perform CT-guided chest tube insertion after failed attempt by PCCM on 12/02/24. Has undergone thoracentesis previously with output of 90mL of amber fluid. Per chart review, last dose of aspirin  was on 11/29/24, not specifically held for this procedure.  12/02/24 CT chest reviewed by Dr. Vanice, approved for right chest tube placement with moderate sedation. Chest tube to be managed by pulmonary team.  Risks and benefits of chest tube placement were discussed with the patient including bleeding, infection, damage to adjacent structures, malfunction of the tube requiring additional procedures and sepsis.  All of the patient's questions were answered, patient is agreeable to proceed. Consent signed and in chart.   Electronically Signed: Greig FORBES Jasmine, PA-C   12/03/2024, 9:14 AM     I spent a total of 20 Minutes in face to face in clinical consultation, greater than 50% of which was  counseling/coordinating care for image guided right chest tube placement.   "

## 2024-12-03 NOTE — Plan of Care (Signed)

## 2024-12-04 DIAGNOSIS — B9689 Other specified bacterial agents as the cause of diseases classified elsewhere: Secondary | ICD-10-CM | POA: Diagnosis not present

## 2024-12-04 DIAGNOSIS — J189 Pneumonia, unspecified organism: Secondary | ICD-10-CM | POA: Diagnosis not present

## 2024-12-04 DIAGNOSIS — D7218 Eosinophilia in diseases classified elsewhere: Secondary | ICD-10-CM | POA: Diagnosis not present

## 2024-12-04 DIAGNOSIS — J90 Pleural effusion, not elsewhere classified: Secondary | ICD-10-CM | POA: Diagnosis not present

## 2024-12-04 DIAGNOSIS — J918 Pleural effusion in other conditions classified elsewhere: Secondary | ICD-10-CM | POA: Diagnosis not present

## 2024-12-04 NOTE — Progress Notes (Signed)
 " PROGRESS NOTE    Whitney May  FMW:[id number] DOB: 08-Apr-1961 DOA: 11/25/2024 PCP: Alston Silvio BROCKS, FNP    Brief Narrative:  64 year old with history of chronic left bundle branch block, chronic diastolic dysfunction, coronary artery disease status post PCI and stenting on aspirin  and Brilinta , Ranexa , ischemic cardiomyopathy status post ICD, history of microperforation due to ICD, hyperlipidemia, hypothyroidism and ongoing smoker presented with 2 days of shortness of breath, cough and pleuritic chest pain.  In the emergency room afebrile.  Hemodynamically stable.  92% on 2 L oxygen .  WBC count 32.9.  CT angiogram of the chest was negative for PE but showed moderate right pleural effusion that was slightly loculated.  Patient was started on antibiotics.  Admitted with thoracentesis, pulmonary following.  Subjective:  Patient seen and examined.  No overnight events.  Slight discomfort at the right lower chest wall persist.  Some dry cough.  Afebrile.  WBC count 21,000.   Assessment & Plan:   Sepsis present on admission secondary to community-acquired pneumonia with loculated right pleural effusion  Empyema ruled out.   Presented with tachycardia, leukocytosis and tachypnea. Blood cultures negative.  Lactic acid normal. Procalcitonin less than 0.1. Initially on Rocephin  and azithromycin , currently on Rocephin  and doxycycline .  Will complete 7 days of antibiotic therapy. Initial pleural fluid analysis-no growth so far. WBC was trending up, pulmonary consulted.  Given 5 days of break from aspirin  and Brilinta -bedside thoracentesis attempted and was not successful.  Repeat fluid cultures no growth so far. IR consulted for image guided chest tube placement today. Start chest physiotherapy, incentive spirometry, deep breathing exercises, sputum induction, mucolytic's and bronchodilators. Supplemental oxygen  to keep saturations more than 90%.  Mobilize in the hallway.  Chronic systolic heart  failure due to ischemic cardiomyopathy,  Coronary artery disease status post stent on aspirin  and Brilinta  Status post ICD Chronic left bundle branch block  Cardiovascular she has remained stable. Off DAPT for procedure since 1/24.  Remains stable so far. Patient continued on statin, Ranexa ,  Hypothyroidism: On Synthroid .  Continue.  Smoker: Counseled to quit.  On nicotine  patch.     DVT prophylaxis: enoxaparin  (LOVENOX ) injection 40 mg Start: 12/03/24 1800   Code Status: Full code Family Communication: None at the bedside Disposition Plan: Status is: Inpatient Remains inpatient appropriate because: IV antibiotics, inpatient procedures     Consultants:  Pulmonary IR  Procedures:  Diagnostic pleural tap at the bedside  Antimicrobials:  Rocephin  doxycycline  1/21     Objective: Vitals:   12/04/24 0500 12/04/24 0619 12/04/24 0739 12/04/24 0813  BP:  132/72 115/67   Pulse:  77 82 75  Resp:    18  Temp:   98.6 F (37 C)   TempSrc:      SpO2:  99% 95% 96%  Weight: 67.2 kg     Height:        Intake/Output Summary (Last 24 hours) at 12/04/2024 1023 Last data filed at 12/03/2024 1800 Gross per 24 hour  Intake 250 ml  Output --  Net 250 ml   Filed Weights   12/02/24 0500 12/02/24 1310 12/04/24 0500  Weight: 64.5 kg 64.5 kg 67.2 kg    Examination:  General exam: Appears calm and comfortable.  Frail.  Not in any distress. Respiratory system: Bronchial breath sounds in the right posterior base.  Respiratory effort normal.  Coughs on deep breathing.  Not in distress.  No accessory muscle use.  On 2 L oxygen . Cardiovascular system: S1 & S2 heard,  RRR.  AICD present left precordium.  No pedal edema. Gastrointestinal system: Abdomen is nondistended, soft and nontender. No organomegaly or masses felt. Normal bowel sounds heard.    Data Reviewed: I have personally reviewed following labs and imaging studies  CBC: Recent Labs  Lab 11/29/24 0937 11/30/24 0658  12/01/24 0037 12/02/24 0501 12/03/24 0947  WBC 20.6* 22.5* 23.2* 26.9* 21.1*  NEUTROABS 14.0*  --   --   --  17.3*  HGB 13.5 14.3 12.8 12.1 12.5  HCT 42.2 44.1 39.8 37.9 39.7  MCV 84.6 83.8 83.6 84.8 85.6  PLT 341 345 323 337 368   Basic Metabolic Panel: Recent Labs  Lab 11/28/24 0425 11/29/24 0937 11/30/24 0658 12/01/24 0037 12/02/24 0501 12/03/24 0703  NA 140 135 138 133* 137  --   K 4.9 4.2 4.9 4.3 4.3  --   CL 103 99 99 95* 101  --   CO2 28 27 30 28 28   --   GLUCOSE 73 89 89 227* 103*  --   BUN 13 10 11 22 16   --   CREATININE 1.00 0.84 1.14* 1.17* 0.89 0.99  CALCIUM  8.9 8.7* 9.5 8.7* 8.7*  --   MG 2.5* 1.8 2.1  --  2.2  --   PHOS  --   --  3.8  --  3.7  --    GFR: Estimated Creatinine Clearance: 53.6 mL/min (by C-G formula based on SCr of 0.99 mg/dL). Liver Function Tests: No results for input(s): AST, ALT, ALKPHOS, BILITOT, PROT, ALBUMIN in the last 168 hours.  No results for input(s): LIPASE, AMYLASE in the last 168 hours. No results for input(s): AMMONIA in the last 168 hours. Coagulation Profile: No results for input(s): INR, PROTIME in the last 168 hours. Cardiac Enzymes: No results for input(s): CKTOTAL, CKMB, CKMBINDEX, TROPONINI in the last 168 hours. BNP (last 3 results) No results for input(s): PROBNP in the last 8760 hours. HbA1C: No results for input(s): HGBA1C in the last 72 hours. CBG: No results for input(s): GLUCAP in the last 168 hours. Lipid Profile: No results for input(s): CHOL, HDL, LDLCALC, TRIG, CHOLHDL, LDLDIRECT in the last 72 hours. Thyroid  Function Tests: No results for input(s): TSH, T4TOTAL, FREET4, T3FREE, THYROIDAB in the last 72 hours. Anemia Panel: No results for input(s): VITAMINB12, FOLATE, FERRITIN, TIBC, IRON, RETICCTPCT in the last 72 hours. Sepsis Labs: Recent Labs  Lab 12/02/24 0501  PROCALCITON <0.10    Recent Results (from the past 240 hours)   Culture, blood (routine x 2)     Status: None   Collection Time: 11/25/24  7:14 PM   Specimen: BLOOD  Result Value Ref Range Status   Specimen Description BLOOD LEFT ANTECUBITAL  Final   Special Requests   Final    BOTTLES DRAWN AEROBIC AND ANAEROBIC Blood Culture results may not be optimal due to an inadequate volume of blood received in culture bottles   Culture   Final    NO GROWTH 5 DAYS Performed at [facility], [address], [place], [place] [address]    Report Status 11/30/2024 FINAL  Final  Culture, blood (routine x 2)     Status: None   Collection Time: 11/25/24  7:18 PM   Specimen: BLOOD  Result Value Ref Range Status   Specimen Description BLOOD RIGHT ANTECUBITAL  Final   Special Requests   Final    BOTTLES DRAWN AEROBIC AND ANAEROBIC Blood Culture results may not be optimal due to an inadequate volume of blood  received in culture bottles   Culture   Final    NO GROWTH 5 DAYS Performed at [facility], [address], [place], [place] [address]    Report Status 11/30/2024 FINAL  Final  Resp panel by RT-PCR (RSV, Flu A&B, Covid) Anterior Nasal Swab     Status: None   Collection Time: 11/25/24  7:22 PM   Specimen: Anterior Nasal Swab  Result Value Ref Range Status   SARS Coronavirus 2 by RT PCR NEGATIVE NEGATIVE Final    Comment: (NOTE) SARS-CoV-2 target nucleic acids are NOT DETECTED.  The SARS-CoV-2 RNA is generally detectable in upper respiratory specimens during the acute phase of infection. The lowest concentration of SARS-CoV-2 viral copies this assay can detect is 138 copies/mL. A negative result does not preclude SARS-Cov-2 infection and should not be used as the sole basis for treatment or other patient management decisions. A negative result may occur with  improper specimen collection/handling, submission of specimen other than nasopharyngeal swab, presence of viral mutation(s) within the areas targeted by this assay, and inadequate number of  viral copies(<138 copies/mL). A negative result must be combined with clinical observations, patient history, and epidemiological information. The expected result is Negative.  Fact Sheet for Patients:  bloggercourse.com  Fact Sheet for Healthcare Providers:  seriousbroker.it  This test is no t yet approved or cleared by the [place]  FDA and  has been authorized for detection and/or diagnosis of SARS-CoV-2 by FDA under an Emergency Use Authorization (EUA). This EUA will remain  in effect (meaning this test can be used) for the duration of the COVID-19 declaration under Section 564(b)(1) of the Act, 21 U.S.C.section 360bbb-3(b)(1), unless the authorization is terminated  or revoked sooner.       Influenza A by PCR NEGATIVE NEGATIVE Final   Influenza B by PCR NEGATIVE NEGATIVE Final    Comment: (NOTE) The Xpert Xpress SARS-CoV-2/FLU/RSV plus assay is intended as an aid in the diagnosis of influenza from Nasopharyngeal swab specimens and should not be used as a sole basis for treatment. Nasal washings and aspirates are unacceptable for Xpert Xpress SARS-CoV-2/FLU/RSV testing.  Fact Sheet for Patients: bloggercourse.com  Fact Sheet for Healthcare Providers: seriousbroker.it  This test is not yet approved or cleared by the [place]  FDA and has been authorized for detection and/or diagnosis of SARS-CoV-2 by FDA under an Emergency Use Authorization (EUA). This EUA will remain in effect (meaning this test can be used) for the duration of the COVID-19 declaration under Section 564(b)(1) of the Act, 21 U.S.C. section 360bbb-3(b)(1), unless the authorization is terminated or revoked.     Resp Syncytial Virus by PCR NEGATIVE NEGATIVE Final    Comment: (NOTE) Fact Sheet for Patients: bloggercourse.com  Fact Sheet for Healthcare  Providers: seriousbroker.it  This test is not yet approved or cleared by the [place]  FDA and has been authorized for detection and/or diagnosis of SARS-CoV-2 by FDA under an Emergency Use Authorization (EUA). This EUA will remain in effect (meaning this test can be used) for the duration of the COVID-19 declaration under Section 564(b)(1) of the Act, 21 U.S.C. section 360bbb-3(b)(1), unless the authorization is terminated or revoked.  Performed at [facility], [address] [place], [place] [address]   MRSA Next Gen by PCR, Nasal     Status: None   Collection Time: 11/26/24  3:40 AM   Specimen: Nasal Mucosa; Nasal Swab  Result Value Ref Range Status   MRSA by PCR Next Gen NOT DETECTED NOT  DETECTED Final    Comment: (NOTE) The GeneXpert MRSA Assay (FDA approved for NASAL specimens only), is one component of a comprehensive MRSA colonization surveillance program. It is not intended to diagnose MRSA infection nor to guide or monitor treatment for MRSA infections. Test performance is not FDA approved in patients less than 49 years old. Performed at [facility], [address] [place], [place] [address]   Gram stain     Status: None   Collection Time: 11/26/24 10:00 AM   Specimen: Pleura  Result Value Ref Range Status   Specimen Description PLEURAL  Final   Special Requests PLEURAL  Final   Gram Stain   Final    WBC PRESENT, PREDOMINANTLY PMN NO ORGANISMS SEEN CYTOSPIN SMEAR Performed at [facility], [address], [place], [place] [address]    Report Status 11/26/2024 FINAL  Final  Culture, body fluid w Gram Stain-bottle     Status: None   Collection Time: 11/26/24 10:00 AM   Specimen: Pleura  Result Value Ref Range Status   Specimen Description PLEURAL  Final   Special Requests PLEURAL  Final   Culture   Final    NO GROWTH 5 DAYS Performed at [facility], [address] [place], [place] [address]    Report Status 12/01/2024 FINAL   Final  Body fluid culture w Gram Stain     Status: None (Preliminary result)   Collection Time: 12/02/24  2:01 PM   Specimen: Path fluid; Body Fluid  Result Value Ref Range Status   Specimen Description FLUID  Final   Special Requests LUNG,RIGHT LOWER LOBE  Final   Gram Stain NO WBC SEEN NO ORGANISMS SEEN   Final   Culture   Final    NO GROWTH 2 DAYS Performed at [facility], [address], [place], [place] [address]    Report Status PENDING  Incomplete         Radiology Studies: CT CHEST WO CONTRAST Result Date: 12/02/2024 CLINICAL DATA:  Pleural effusion EXAM: CT CHEST WITHOUT CONTRAST TECHNIQUE: Multidetector CT imaging of the chest was performed following the standard protocol without IV contrast. RADIATION DOSE REDUCTION: This exam was performed according to the departmental dose-optimization program which includes automated exposure control, adjustment of the mA and/or kV according to patient size and/or use of iterative reconstruction technique. COMPARISON:  Chest x-ray 12/02/2024, chest CT 11/25/2024 FINDINGS: Cardiovascular: Limited assessment without intravenous contrast. Mild atherosclerosis. No aneurysm. Left-sided cardiac pacing device. Cardiomegaly. No pericardial effusion. Coronary vascular calcification. Mediastinum/Nodes: Patent trachea. No suspicious thyroid  mass. Decreased mediastinal adenopathy compared to prior. Borderline right paratracheal node measuring 10 mm, previously 12 mm. Assessment for hilar nodes limited without contrast. Esophagus within normal limits Lungs/Pleura: Emphysema, patient is already enrolled in a lung cancer CT screening program. Small loculated right pleural effusion, decreased compared to the prior CT. Few small gas locules within the right posterior pleural collection compared to prior. Partial consolidation in the right lower lobe, probable atelectasis. Mild left posterior pleural thickening at the lung base. No new airspace disease.  Mild subpleural reticulation. Upper Abdomen: No acute finding Musculoskeletal: No acute osseous abnormality IMPRESSION: 1. Small loculated right pleural effusion, decreased compared to the prior CT. Few small gas locules within the right posterior pleural collection compared to prior, this could be related to recent thoracentesis versus gas related to infection. Partial consolidation in the right lower lobe, probable atelectasis. 2. Emphysema. 3. Cardiomegaly. 4. Decreased mediastinal adenopathy compared to prior. 5. Aortic atherosclerosis.  Aortic Atherosclerosis (ICD10-I70.0) and Emphysema (ICD10-J43.9). Electronically Signed   By: Luke Bun M.D.   On: 12/02/2024 22:59   DG Chest Port 1 View Result Date: 12/02/2024 EXAM: 2 VIEW(S) XRAY OF THE CHEST 12/02/2024 02:26:00 PM COMPARISON: 11/29/2024 CLINICAL HISTORY: Status post thoracentesis. FINDINGS: LINES, TUBES AND DEVICES: Left chest AICD. LUNGS AND PLEURA: Decreased right pleural effusion. Residual opacity in right lower lung. Mildly improved lung base aeration. Left basilar atelectasis. No pneumothorax. HEART AND MEDIASTINUM: No acute abnormality of the cardiac and mediastinal silhouettes. BONES AND SOFT TISSUES: No acute osseous abnormality. IMPRESSION: 1. Decreased right pleural effusion. 2. Residual opacity in right lower lung. 3. Left basilar atelectasis. 4. Mildly improved lung base aeration. Electronically signed by: Elsie Gravely MD 12/02/2024 03:08 PM EST RP Workstation: [id number]        Scheduled Meds:  calcium  carbonate  1 tablet Oral Daily   doxycycline   100 mg Oral Q12H   enoxaparin  (LOVENOX ) injection  40 mg Subcutaneous Q24H   fluticasone  furoate-vilanterol  1 puff Inhalation Daily   guaiFENesin   600 mg Oral BID   levothyroxine   100 mcg Oral Q0600   lidocaine  1 %  10 mL Other Once   melatonin  6 mg Oral QHS   midodrine   5 mg Oral TID WC   nicotine   21 mg Transdermal Daily   ranolazine   500 mg Oral BID   rosuvastatin   40  mg Oral Daily   Continuous Infusions:  cefTRIAXone  (ROCEPHIN )  IV 2 g (12/04/24 0823)     LOS: 9 days       Renato Applebaum, MD [facility]   "

## 2024-12-04 NOTE — Plan of Care (Signed)
  Problem: Safety: Goal: Ability to remain free from injury will improve Outcome: Progressing   Problem: Skin Integrity: Goal: Risk for impaired skin integrity will decrease Outcome: Progressing   Problem: Coping: Goal: Level of anxiety will decrease Outcome: Not Progressing   

## 2024-12-04 NOTE — Progress Notes (Signed)
 "  NAME:  Whitney May, MRN:  [id number], DOB:  05-Feb-1961, LOS: 9 ADMISSION DATE:  11/25/2024, CONSULTATION DATE:  11/29/24 REFERRING MD:  Tat, CHIEF COMPLAINT:  abnormal chest imaging    History of Present Illness:  64 yo F  PMH tobacco use, CAD with prior PCI on DAPT (ASA brilinta ), HFrEF, ICM, s/p ICD, who was admitted to TRH at [facility] after presenting 1/21 w SOB/cough. Started tx for PNA and underwent thora 1/21 with IR for R pleural effusion.  Fluid was loculated and incompletely drained so transfer was requested to [facility] campus for chest tube placement.   Pt arrived days 1/24    Pertinent  Medical History  CAD ICM HFrEF S/p ICD   Significant Hospital Events: Including procedures, antibiotic start and stop dates in addition to other pertinent events   1/20 admit to [facility]  1/21 thora w IR 1/22 txf request to [facility] for chest tube 1/24 arrives to [facility].  1/27: Failed attempt at chest tube placement.  Removal of only 5 cc of fluid. CT chest 1. Small loculated right pleural effusion, decreased compared to the prior CT. Few small gas locules within the right posterior pleural collection. Partial consolidation in the right lower lobe, probable atelectasis. Decreased mediastinal adenopathy compared to prior.  Interim History / Subjective:  No overnight events.  Feels at baseline.  Objective    Blood pressure 115/67, pulse 75, temperature 98.6 F (37 C), resp. rate 18, height 5' 2 (1.575 m), weight 67.2 kg, SpO2 96%.        Intake/Output Summary (Last 24 hours) at 12/04/2024 1025 Last data filed at 12/03/2024 1800 Gross per 24 hour  Intake 250 ml  Output --  Net 250 ml   Filed Weights   12/02/24 0500 12/02/24 1310 12/04/24 0500  Weight: 64.5 kg 64.5 kg 67.2 kg    Examination: General: Elderly female not in distress. Lungs: Lungs clear. Heart: regular rate rhythm, no murmur appreciated.  Abdomen: non tender, non distended. Normal BS.  Neuro: axox 3.  All extremities.   Resolved  problem list   Assessment and Plan   Right unilateral loculated effusion: Elevated eosinophil count in pleural fluid likely traumatic: Bacterial pneumonia: -Agree with treatment for bacterial pneumonia with ceftriaxone  and doxycycline .  Complete 7-day course. -Incomplete pleural fluid studies sent.  Based on the available studies patient does not have complicated parapneumonic effusion.  The patient does not have empyema based on pleural fluid appearance.  LDH was not sent.  Likely exudative based on total protein of 4.1.  Body protein 7.2. -POCUS showing at least moderate size right loculated pleural effusion with septation.  Chest tube placement attempted on 1/27.  However with return of only 5 cc fluid, no visualization of where the guidewire is going and patient still being under the effect of Brilinta  I did not feel comfortable trying to advance the guidewire through the septation.  Procedure was aborted.  The patient continues to have loculated right-sided pleural effusion. - Continue to hold Brilinta  and DVT prophylaxis Lovenox . - Plan for chest tube placement today by CT guidance with IR.  If fails may need CT surgery consult.  Once chest tube is and will likely need lytics. - Toxoplasma PCR pending.  Strongyloidiasis negative.  Pulmonary will follow.  Labs   CBC: Recent Labs  Lab 11/29/24 0937 11/30/24 0658 12/01/24 0037 12/02/24 0501 12/03/24 0947  WBC 20.6* 22.5* 23.2* 26.9* 21.1*  NEUTROABS 14.0*  --   --   --  17.3*  HGB 13.5 14.3 12.8 12.1 12.5  HCT 42.2 44.1 39.8 37.9 39.7  MCV 84.6 83.8 83.6 84.8 85.6  PLT 341 345 323 337 368    Basic Metabolic Panel: Recent Labs  Lab 11/28/24 0425 11/29/24 0937 11/30/24 0658 12/01/24 0037 12/02/24 0501 12/03/24 0703  NA 140 135 138 133* 137  --   K 4.9 4.2 4.9 4.3 4.3  --   CL 103 99 99 95* 101  --   CO2 28 27 30 28 28   --   GLUCOSE 73 89 89 227* 103*  --   BUN 13 10 11 22 16   --   CREATININE 1.00 0.84 1.14* 1.17* 0.89  0.99  CALCIUM  8.9 8.7* 9.5 8.7* 8.7*  --   MG 2.5* 1.8 2.1  --  2.2  --   PHOS  --   --  3.8  --  3.7  --    GFR: Estimated Creatinine Clearance: 53.6 mL/min (by C-G formula based on SCr of 0.99 mg/dL). Recent Labs  Lab 11/30/24 0658 12/01/24 0037 12/02/24 0501 12/03/24 0947  PROCALCITON  --   --  <0.10  --   WBC 22.5* 23.2* 26.9* 21.1*    Liver Function Tests: No results for input(s): AST, ALT, ALKPHOS, BILITOT, PROT, ALBUMIN in the last 168 hours.  No results for input(s): LIPASE, AMYLASE in the last 168 hours. No results for input(s): AMMONIA in the last 168 hours.  ABG    Component Value Date/Time   TCO2 24 09/18/2022 1507     Coagulation Profile: No results for input(s): INR, PROTIME in the last 168 hours.   Cardiac Enzymes: No results for input(s): CKTOTAL, CKMB, CKMBINDEX, TROPONINI in the last 168 hours.  HbA1C: Hgb A1c MFr Bld  Date/Time Value Ref Range Status  09/18/2022 03:13 PM 5.3 4.8 - 5.6 % Final    Comment:    (NOTE) Pre diabetes:          5.7%-6.4%  Diabetes:              >6.4%  Glycemic control for   <7.0% adults with diabetes   08/02/2018 05:33 PM 5.5 4.8 - 5.6 % Final    Comment:    (NOTE) Pre diabetes:          5.7%-6.4% Diabetes:              >6.4% Glycemic control for   <7.0% adults with diabetes     CBG: No results for input(s): GLUCAP in the last 168 hours.   Past Medical History:  She,  has a past medical history of Alcohol  use, CAD (coronary artery disease), Chest pain, Hyperlipidemia, mixed, Hypotension, Hypothyroidism, Ischemic cardiomyopathy, LBBB (left bundle branch block), MI (myocardial infarction) (HCC), and Tobacco abuse.   Surgical History:   Past Surgical History:  Procedure Laterality Date   BIV ICD INSERTION CRT-D N/A 05/16/2024   Procedure: BIV ICD INSERTION CRT-D;  Surgeon: Mealor, Eulas BRAVO, MD;  Location: [facility] INVASIVE CV LAB;  Service: Cardiovascular;  Laterality: N/A;    CESAREAN SECTION     CORONARY STENT INTERVENTION N/A 03/07/2019   Procedure: CORONARY STENT INTERVENTION;  Surgeon: Wonda Sharper, MD;  Location: [facility] INVASIVE CV LAB;  Service: Cardiovascular;  Laterality: N/A;   CORONARY/GRAFT ACUTE MI REVASCULARIZATION N/A 08/02/2018   Procedure: Coronary/Graft Acute MI Revascularization;  Surgeon: Anner Alm ORN, MD;  Location: [facility] INVASIVE CV LAB;  Service: Cardiovascular;  Laterality: N/A;   CORONARY/GRAFT ACUTE MI REVASCULARIZATION N/A 09/18/2022  Procedure: Coronary/Graft Acute MI Revascularization;  Surgeon: Court Dorn PARAS, MD;  Location: [facility] INVASIVE CV LAB;  Service: Cardiovascular;  Laterality: N/A;   ICD IMPLANT Left    LEAD REVISION/REPAIR N/A 07/03/2024   Procedure: LEAD REVISION/REPAIR;  Surgeon: Inocencio Soyla Lunger, MD;  Location: [facility] INVASIVE CV LAB;  Service: Cardiovascular;  Laterality: N/A;   LEFT HEART CATH AND CORONARY ANGIOGRAPHY N/A 08/02/2018   Procedure: LEFT HEART CATH AND CORONARY ANGIOGRAPHY;  Surgeon: Anner Alm ORN, MD;  Location: [facility] INVASIVE CV LAB;  Service: Cardiovascular;  Laterality: N/A;   LEFT HEART CATH AND CORONARY ANGIOGRAPHY N/A 03/07/2019   Procedure: LEFT HEART CATH AND CORONARY ANGIOGRAPHY;  Surgeon: Cherrie Toribio SAUNDERS, MD;  Location: [facility] INVASIVE CV LAB;  Service: Cardiovascular;  Laterality: N/A;   LEFT HEART CATH AND CORONARY ANGIOGRAPHY N/A 09/18/2022   Procedure: LEFT HEART CATH AND CORONARY ANGIOGRAPHY;  Surgeon: Court Dorn PARAS, MD;  Location: [facility] INVASIVE CV LAB;  Service: Cardiovascular;  Laterality: N/A;   LEFT HEART CATH AND CORONARY ANGIOGRAPHY N/A 04/16/2023   Procedure: LEFT HEART CATH AND CORONARY ANGIOGRAPHY;  Surgeon: Anner Alm ORN, MD;  Location: [facility] INVASIVE CV LAB;  Service: Cardiovascular;  Laterality: N/A;   THORACENTESIS  12/02/2024   Procedure: THORACENTESIS;  Surgeon: Theodoro Lakes, MD;  Location: [facility] ENDOSCOPY;  Service: Pulmonary;;     Social History:   reports that she has been  smoking cigarettes. She started smoking about 28 years ago. She has a 22 pack-year smoking history. She has never used smokeless tobacco. She reports current alcohol  use of about 8.0 standard drinks of alcohol  per week. She reports that she does not use drugs.   Family History:  Her family history includes Coronary artery disease in her maternal grandmother and mother; Diabetes in her father; Hypertension in her father.   Allergies Allergies[1]   Home Medications  Prior to Admission medications  Medication Sig Start Date End Date Taking? Authorizing Provider  acetaminophen  (TYLENOL ) 500 MG tablet Take 1,000 mg by mouth every 6 (six) hours as needed for moderate pain (pain score 4-6).   Yes [provider]  aspirin  81 MG chewable tablet Chew 1 tablet (81 mg total) by mouth daily. 06/08/23  Yes Milford, Harlene HERO, FNP  bisoprolol  (ZEBETA ) 5 MG tablet TAKE 1/2 TABLET BY MOUTH ONCE DAILY 10/16/24  Yes McLean, Dalton S, MD  calcium  carbonate (TUMS - DOSED IN MG ELEMENTAL CALCIUM ) 500 MG chewable tablet Chew 1 tablet by mouth daily as needed for indigestion or heartburn.   Yes [provider]  empagliflozin  (JARDIANCE ) 10 MG TABS tablet TAKE 1 TABLET BY MOUTH DAILY BEFORE BREAKFAST 11/14/24  Yes Rolan Ezra RAMAN, MD  FT NICOTINE  21 MG/24HR patch Place 21 mg onto the skin daily. 10/14/24  Yes [provider]  furosemide  (LASIX ) 20 MG tablet Take 1 tablet (20 mg total) by mouth daily as needed for weight gain of 3 lbs in 24 hours or 5 lbs in a week 04/02/24 11/26/24 Yes Lee, Jordan, NP  isosorbide  mononitrate (IMDUR ) 30 MG 24 hr tablet TAKE 1 TABLET BY MOUTH DAILY 10/16/24  Yes McLean, Dalton S, MD  levothyroxine  (SYNTHROID ) 75 MCG tablet Take 75 mcg by mouth every morning. 11/17/24  Yes [provider]  losartan  (COZAAR ) 25 MG tablet TAKE 1/2 TABLET BY MOUTH DAILY 09/04/24  Yes Rolan Ezra RAMAN, MD  nitroGLYCERIN  (NITROSTAT ) 0.4 MG SL tablet Place 1 tablet (0.4 mg total)  under the tongue every 5 (five) minutes x 3 doses  as needed for chest pain. 07/05/23  Yes Rolan Ezra RAMAN, MD  ranolazine  (RANEXA ) 500 MG 12 hr tablet Take 1 tablet (500 mg total) by mouth 2 (two) times daily. PLEASE SCHEDULE APPOINTMENT FOR MORE REFILLS [phone] OPTION 2 11/14/24  Yes Rolan Ezra RAMAN, MD  rosuvastatin  (CRESTOR ) 40 MG tablet Take 1 tablet (40 mg total) by mouth daily. PLEASE SCHEDULE APPOINTMENT FOR MORE REFILLS [phone] OPTION 2 10/27/24  Yes Rolan Ezra RAMAN, MD  ticagrelor  (BRILINTA ) 90 MG TABS tablet Take 1 tablet (90 mg total) by mouth 2 (two) times daily. PLEASE SCHEDULE APPOINTMENT FOR MORE REFILLS [phone] OPTION 2 09/08/24  Yes Rolan Ezra RAMAN, MD  Vitamin D , Ergocalciferol , (DRISDOL) 1.25 MG (50000 UNIT) CAPS capsule Take 50,000 Units by mouth once a week. 10/13/24  Yes [provider]    Total care time: 35 minutes   Care time was exclusive of separately billable procedures and treating other patients.  Care was necessary to treat or prevent imminent or life-threatening deterioration.   Care was time spent personally by me on the following activities: development of treatment plan with patient and/or surrogate as well as nursing, discussions with consultants, evaluation of patient's response to treatment, examination of patient, obtaining history from patient or surrogate, ordering and performing treatments and interventions, ordering and review of laboratory studies, ordering and review of radiographic studies and pulse oximetry.   Sammi JONETTA Fredericks, MD Pulmonary, Critical Care and Sleep Attending.   12/04/2024, 10:25 AM             [1]  Allergies Allergen Reactions   Penicillins Nausea And Vomiting    Fever    "

## 2024-12-04 NOTE — Plan of Care (Signed)
   Problem: Education: Goal: Knowledge of General Education information will improve Description Including pain rating scale, medication(s)/side effects and non-pharmacologic comfort measures Outcome: Progressing   Problem: Health Behavior/Discharge Planning: Goal: Ability to manage health-related needs will improve Outcome: Progressing   Problem: Clinical Measurements: Goal: Ability to maintain clinical measurements within normal limits will improve Outcome: Progressing Goal: Will remain free from infection Outcome: Progressing Goal: Diagnostic test results will improve Outcome: Progressing Goal: Respiratory complications will improve Outcome: Progressing Goal: Cardiovascular complication will be avoided Outcome: Progressing   Problem: Elimination: Goal: Will not experience complications related to bowel motility Outcome: Progressing Goal: Will not experience complications related to urinary retention Outcome: Progressing

## 2024-12-04 NOTE — Progress Notes (Signed)
 Spoke with patient at bedside to make her aware of the reason behind the delay on her chest tube placement. CT procedural area not available yesterday or today. Dr. Jennefer recommends this procedure be completed under CT guidance due to the small size of the effusion. Loculation has much improved between imaging from 1/24 and 1/27, risk of performing procedure in non ideal procedural area outweighs urgency of such a small effusion.  Will make the patient NPO at midnight starting tonight anticipating the procedure is able to be done tomorrow.   Whitney May Jasmine PA-C 12/04/2024 3:32 PM

## 2024-12-05 ENCOUNTER — Inpatient Hospital Stay (HOSPITAL_COMMUNITY)

## 2024-12-05 DIAGNOSIS — J189 Pneumonia, unspecified organism: Secondary | ICD-10-CM | POA: Diagnosis not present

## 2024-12-05 DIAGNOSIS — J90 Pleural effusion, not elsewhere classified: Secondary | ICD-10-CM | POA: Diagnosis not present

## 2024-12-05 LAB — CBC WITH DIFFERENTIAL/PLATELET
Abs Immature Granulocytes: 1.36 10*3/uL — ABNORMAL HIGH (ref 0.00–0.07)
Basophils Absolute: 0.2 10*3/uL — ABNORMAL HIGH (ref 0.0–0.1)
Basophils Relative: 1 %
Eosinophils Absolute: 0.7 10*3/uL — ABNORMAL HIGH (ref 0.0–0.5)
Eosinophils Relative: 3 %
HCT: 36.4 % (ref 36.0–46.0)
Hemoglobin: 11.6 g/dL — ABNORMAL LOW (ref 12.0–15.0)
Immature Granulocytes: 7 %
Lymphocytes Relative: 16 %
Lymphs Abs: 3.2 10*3/uL (ref 0.7–4.0)
MCH: 27.4 pg (ref 26.0–34.0)
MCHC: 31.9 g/dL (ref 30.0–36.0)
MCV: 85.8 fL (ref 80.0–100.0)
Monocytes Absolute: 2.9 10*3/uL — ABNORMAL HIGH (ref 0.1–1.0)
Monocytes Relative: 15 %
Neutro Abs: 11.7 10*3/uL — ABNORMAL HIGH (ref 1.7–7.7)
Neutrophils Relative %: 58 %
Platelets: 318 10*3/uL (ref 150–400)
RBC: 4.24 MIL/uL (ref 3.87–5.11)
RDW: 15.4 % (ref 11.5–15.5)
Smear Review: NORMAL
WBC: 19.9 10*3/uL — ABNORMAL HIGH (ref 4.0–10.5)
nRBC: 0 % (ref 0.0–0.2)

## 2024-12-05 LAB — BODY FLUID CELL COUNT WITH DIFFERENTIAL
Eos, Fluid: 1 %
Lymphs, Fluid: 13 %
Monocyte-Macrophage-Serous Fluid: 2 % — ABNORMAL LOW (ref 50–90)
Neutrophil Count, Fluid: 84 % — ABNORMAL HIGH (ref 0–25)
Total Nucleated Cell Count, Fluid: 3825 uL — ABNORMAL HIGH (ref 0–1000)

## 2024-12-05 LAB — BODY FLUID CULTURE W GRAM STAIN
Culture: NO GROWTH
Gram Stain: NONE SEEN

## 2024-12-05 MED ORDER — FENTANYL CITRATE (PF) 100 MCG/2ML IJ SOLN
INTRAMUSCULAR | Status: AC
  Filled 2024-12-05: qty 2

## 2024-12-05 MED ORDER — FENTANYL CITRATE (PF) 100 MCG/2ML IJ SOLN
INTRAMUSCULAR | Status: AC | PRN
  Administered 2024-12-05 (×2): 50 ug via INTRAVENOUS

## 2024-12-05 MED ORDER — TICAGRELOR 90 MG PO TABS
90.0000 mg | ORAL_TABLET | Freq: Two times a day (BID) | ORAL | Status: DC
  Administered 2024-12-05 – 2024-12-06 (×3): 90 mg via ORAL
  Filled 2024-12-05 (×3): qty 1

## 2024-12-05 MED ORDER — SODIUM CHLORIDE 0.9% FLUSH
5.0000 mL | Freq: Three times a day (TID) | INTRAVENOUS | Status: DC
  Administered 2024-12-05 – 2024-12-08 (×10): 5 mL

## 2024-12-05 MED ORDER — ACETAMINOPHEN 325 MG PO TABS: 650.0000 mg | ORAL_TABLET | Freq: Four times a day (QID) | ORAL | Status: AC | PRN

## 2024-12-05 MED ORDER — MIDAZOLAM HCL 2 MG/2ML IJ SOLN
INTRAMUSCULAR | Status: AC
  Filled 2024-12-05: qty 2

## 2024-12-05 MED ORDER — LIDOCAINE-EPINEPHRINE 1 %-1:100000 IJ SOLN
20.0000 mL | Freq: Once | INTRAMUSCULAR | Status: AC
  Administered 2024-12-05: 10 mL

## 2024-12-05 MED ORDER — SODIUM CHLORIDE 0.9% FLUSH
10.0000 mL | Freq: Three times a day (TID) | INTRAVENOUS | Status: DC
  Administered 2024-12-05 – 2024-12-08 (×10): 10 mL via INTRAPLEURAL

## 2024-12-05 MED ORDER — LIDOCAINE-EPINEPHRINE 1 %-1:100000 IJ SOLN
INTRAMUSCULAR | Status: AC
  Filled 2024-12-05: qty 20

## 2024-12-05 MED ORDER — OXYCODONE HCL 5 MG PO TABS
5.0000 mg | ORAL_TABLET | ORAL | Status: AC | PRN
  Administered 2024-12-05 – 2024-12-12 (×26): 5 mg via ORAL
  Filled 2024-12-05 (×26): qty 1

## 2024-12-05 MED ORDER — ASPIRIN 81 MG PO TBEC
81.0000 mg | DELAYED_RELEASE_TABLET | Freq: Every day | ORAL | Status: DC
  Administered 2024-12-05 – 2024-12-06 (×2): 81 mg via ORAL
  Filled 2024-12-05 (×2): qty 1

## 2024-12-05 MED ORDER — MIDAZOLAM HCL (PF) 2 MG/2ML IJ SOLN
INTRAMUSCULAR | Status: AC | PRN
  Administered 2024-12-05 (×2): 1 mg via INTRAVENOUS

## 2024-12-05 NOTE — Plan of Care (Signed)

## 2024-12-05 NOTE — Procedures (Signed)
 Pre procedural Dx: Right loculated pleural collection  Post procedural Dx: Same  Technically successful US  guided placed of a 10 Fr drainage catheter placement into the right pleural space yielding minimal material.    A representative aspirated sample was capped and sent to the laboratory for analysis.    EBL: Trace Complications: None immediate  Whitney Banner, MD Pager #: [phone]

## 2024-12-05 NOTE — Progress Notes (Signed)
 " PROGRESS NOTE    Whitney May  FMW:[id number] DOB: August 01, 1961 DOA: 11/25/2024 PCP: Alston Silvio BROCKS, FNP    Brief Narrative:  64 year old with history of chronic left bundle branch block, chronic diastolic dysfunction, coronary artery disease status post PCI and stenting on aspirin  and Brilinta , Ranexa , ischemic cardiomyopathy status post ICD, history of microperforation due to ICD, hyperlipidemia, hypothyroidism and ongoing smoker presented with 2 days of shortness of breath, cough and pleuritic chest pain.  In the emergency room afebrile.  Hemodynamically stable.  92% on 2 L oxygen .  WBC count 32.9.  CT angiogram of the chest was negative for PE but showed moderate right pleural effusion that was slightly loculated.  Patient was started on antibiotics.  Admitted with thoracentesis, pulmonary following.  Subjective:  Patient seen and examined.  Able to take more deep breaths and now doing incentive.  Afebrile.  WC count 19,000.  Slight discomfort right lower chest wall however much better than before.  Still on 1 to 2 L of oxygen . Unfortunately, CT scan of was not able to accommodate her last 2 days for chest tube placement.   Assessment & Plan:   Sepsis present on admission secondary to community-acquired pneumonia with loculated right pleural effusion  Empyema ruled out.   Presented with tachycardia, leukocytosis and tachypnea. Blood cultures negative.  Lactic acid normal. Procalcitonin less than 0.1. Initially on Rocephin  and azithromycin , currently on Rocephin .  Completed 7 days of doxycycline .   Initial pleural fluid analysis-no growth so far. WBC was trending up, pulmonary consulted.  Given 5 days of break from aspirin  and Brilinta -bedside thoracentesis attempted and was not successful.  Repeat fluid cultures no growth so far. IR consulted for image guided chest tube placement.  Hopefully today she can have chest tube placement.  Pulmonary planning to be Lytics after the chest tube  placement. Continue chest physiotherapy, incentive spirometry, deep breathing exercises, sputum induction, mucolytic's and bronchodilators. Supplemental oxygen  to keep saturations more than 90%.  Mobilize in the hallway.  Chronic systolic heart failure due to ischemic cardiomyopathy,  Coronary artery disease status post stent on aspirin  and Brilinta  Status post ICD Chronic left bundle branch block  Cardiovascular she has remained stable. Off DAPT for procedure since 1/24.  Remains stable so far. Patient continued on statin, Ranexa , Will place patient back on DAPT once chest tube is in place.  Hypothyroidism: On Synthroid .  Continue.  Smoker: Counseled to quit.  On nicotine  patch.     DVT prophylaxis: enoxaparin  (LOVENOX ) injection 40 mg Start: 12/03/24 1800   Code Status: Full code Family Communication: None at the bedside Disposition Plan: Status is: Inpatient Remains inpatient appropriate because: IV antibiotics, inpatient procedures     Consultants:  Pulmonary IR  Procedures:  Diagnostic pleural tap at the bedside  Antimicrobials:  Rocephin  1/21--- Doxycycline  1/21--- 1/28     Objective: Vitals:   12/05/24 0401 12/05/24 0418 12/05/24 0737 12/05/24 1022  BP:  102/62 111/71 110/62  Pulse:  66 82 72  Resp:   17 18  Temp:   97.6 F (36.4 C)   TempSrc:      SpO2:  100% 97% 94%  Weight: 67.2 kg     Height:       No intake or output data in the 24 hours ending 12/05/24 1028  Filed Weights   12/02/24 1310 12/04/24 0500 12/05/24 0401  Weight: 64.5 kg 67.2 kg 67.2 kg    Examination:  General exam: Appears calm and comfortable.  Respiratory system:  Bronchial breath sounds in the right posterior base.  Respiratory effort normal.  Coughs on deep breathing.  Not in distress.  No accessory muscle use.  On 2 L oxygen . Cardiovascular system: S1 & S2 heard, RRR.  AICD present left precordium.  No pedal edema. Gastrointestinal system: Abdomen is nondistended, soft  and nontender. No organomegaly or masses felt. Normal bowel sounds heard.    Data Reviewed: I have personally reviewed following labs and imaging studies  CBC: Recent Labs  Lab 11/29/24 0937 11/30/24 0658 12/01/24 0037 12/02/24 0501 12/03/24 0947 12/05/24 0318  WBC 20.6* 22.5* 23.2* 26.9* 21.1* 19.9*  NEUTROABS 14.0*  --   --   --  17.3* 11.7*  HGB 13.5 14.3 12.8 12.1 12.5 11.6*  HCT 42.2 44.1 39.8 37.9 39.7 36.4  MCV 84.6 83.8 83.6 84.8 85.6 85.8  PLT 341 345 323 337 368 318   Basic Metabolic Panel: Recent Labs  Lab 11/29/24 0937 11/30/24 0658 12/01/24 0037 12/02/24 0501 12/03/24 0703  NA 135 138 133* 137  --   K 4.2 4.9 4.3 4.3  --   CL 99 99 95* 101  --   CO2 27 30 28 28   --   GLUCOSE 89 89 227* 103*  --   BUN 10 11 22 16   --   CREATININE 0.84 1.14* 1.17* 0.89 0.99  CALCIUM  8.7* 9.5 8.7* 8.7*  --   MG 1.8 2.1  --  2.2  --   PHOS  --  3.8  --  3.7  --    GFR: Estimated Creatinine Clearance: 53.6 mL/min (by C-G formula based on SCr of 0.99 mg/dL). Liver Function Tests: No results for input(s): AST, ALT, ALKPHOS, BILITOT, PROT, ALBUMIN in the last 168 hours.  No results for input(s): LIPASE, AMYLASE in the last 168 hours. No results for input(s): AMMONIA in the last 168 hours. Coagulation Profile: No results for input(s): INR, PROTIME in the last 168 hours. Cardiac Enzymes: No results for input(s): CKTOTAL, CKMB, CKMBINDEX, TROPONINI in the last 168 hours. BNP (last 3 results) No results for input(s): PROBNP in the last 8760 hours. HbA1C: No results for input(s): HGBA1C in the last 72 hours. CBG: No results for input(s): GLUCAP in the last 168 hours. Lipid Profile: No results for input(s): CHOL, HDL, LDLCALC, TRIG, CHOLHDL, LDLDIRECT in the last 72 hours. Thyroid  Function Tests: No results for input(s): TSH, T4TOTAL, FREET4, T3FREE, THYROIDAB in the last 72 hours. Anemia Panel: No results for  input(s): VITAMINB12, FOLATE, FERRITIN, TIBC, IRON, RETICCTPCT in the last 72 hours. Sepsis Labs: Recent Labs  Lab 12/02/24 0501  PROCALCITON <0.10    Recent Results (from the past 240 hours)  Culture, blood (routine x 2)     Status: None   Collection Time: 11/25/24  7:14 PM   Specimen: BLOOD  Result Value Ref Range Status   Specimen Description BLOOD LEFT ANTECUBITAL  Final   Special Requests   Final    BOTTLES DRAWN AEROBIC AND ANAEROBIC Blood Culture results may not be optimal due to an inadequate volume of blood received in culture bottles   Culture   Final    NO GROWTH 5 DAYS Performed at [facility], [address], [place], [place] [address]    Report Status 11/30/2024 FINAL  Final  Culture, blood (routine x 2)     Status: None   Collection Time: 11/25/24  7:18 PM   Specimen: BLOOD  Result Value Ref Range Status   Specimen Description BLOOD RIGHT ANTECUBITAL  Final   Special Requests   Final    BOTTLES DRAWN AEROBIC AND ANAEROBIC Blood Culture results may not be optimal due to an inadequate volume of blood received in culture bottles   Culture   Final    NO GROWTH 5 DAYS Performed at [facility], [address], [place], [place] [address]    Report Status 11/30/2024 FINAL  Final  Resp panel by RT-PCR (RSV, Flu A&B, Covid) Anterior Nasal Swab     Status: None   Collection Time: 11/25/24  7:22 PM   Specimen: Anterior Nasal Swab  Result Value Ref Range Status   SARS Coronavirus 2 by RT PCR NEGATIVE NEGATIVE Final    Comment: (NOTE) SARS-CoV-2 target nucleic acids are NOT DETECTED.  The SARS-CoV-2 RNA is generally detectable in upper respiratory specimens during the acute phase of infection. The lowest concentration of SARS-CoV-2 viral copies this assay can detect is 138 copies/mL. A negative result does not preclude SARS-Cov-2 infection and should not be used as the sole basis for treatment or other patient management decisions. A negative result may  occur with  improper specimen collection/handling, submission of specimen other than nasopharyngeal swab, presence of viral mutation(s) within the areas targeted by this assay, and inadequate number of viral copies(<138 copies/mL). A negative result must be combined with clinical observations, patient history, and epidemiological information. The expected result is Negative.  Fact Sheet for Patients:  bloggercourse.com  Fact Sheet for Healthcare Providers:  seriousbroker.it  This test is no t yet approved or cleared by the [place]  FDA and  has been authorized for detection and/or diagnosis of SARS-CoV-2 by FDA under an Emergency Use Authorization (EUA). This EUA will remain  in effect (meaning this test can be used) for the duration of the COVID-19 declaration under Section 564(b)(1) of the Act, 21 U.S.C.section 360bbb-3(b)(1), unless the authorization is terminated  or revoked sooner.       Influenza A by PCR NEGATIVE NEGATIVE Final   Influenza B by PCR NEGATIVE NEGATIVE Final    Comment: (NOTE) The Xpert Xpress SARS-CoV-2/FLU/RSV plus assay is intended as an aid in the diagnosis of influenza from Nasopharyngeal swab specimens and should not be used as a sole basis for treatment. Nasal washings and aspirates are unacceptable for Xpert Xpress SARS-CoV-2/FLU/RSV testing.  Fact Sheet for Patients: bloggercourse.com  Fact Sheet for Healthcare Providers: seriousbroker.it  This test is not yet approved or cleared by the [place]  FDA and has been authorized for detection and/or diagnosis of SARS-CoV-2 by FDA under an Emergency Use Authorization (EUA). This EUA will remain in effect (meaning this test can be used) for the duration of the COVID-19 declaration under Section 564(b)(1) of the Act, 21 U.S.C. section 360bbb-3(b)(1), unless the authorization is terminated  or revoked.     Resp Syncytial Virus by PCR NEGATIVE NEGATIVE Final    Comment: (NOTE) Fact Sheet for Patients: bloggercourse.com  Fact Sheet for Healthcare Providers: seriousbroker.it  This test is not yet approved or cleared by the [place]  FDA and has been authorized for detection and/or diagnosis of SARS-CoV-2 by FDA under an Emergency Use Authorization (EUA). This EUA will remain in effect (meaning this test can be used) for the duration of the COVID-19 declaration under Section 564(b)(1) of the Act, 21 U.S.C. section 360bbb-3(b)(1), unless the authorization is terminated or revoked.  Performed at [facility], [address] [place], [place] [address]   MRSA Next Gen by PCR, Nasal     Status: None  Collection Time: 11/26/24  3:40 AM   Specimen: Nasal Mucosa; Nasal Swab  Result Value Ref Range Status   MRSA by PCR Next Gen NOT DETECTED NOT DETECTED Final    Comment: (NOTE) The GeneXpert MRSA Assay (FDA approved for NASAL specimens only), is one component of a comprehensive MRSA colonization surveillance program. It is not intended to diagnose MRSA infection nor to guide or monitor treatment for MRSA infections. Test performance is not FDA approved in patients less than 35 years old. Performed at [facility], [address], [place], [place] [address]   Gram stain     Status: None   Collection Time: 11/26/24 10:00 AM   Specimen: Pleura  Result Value Ref Range Status   Specimen Description PLEURAL  Final   Special Requests PLEURAL  Final   Gram Stain   Final    WBC PRESENT, PREDOMINANTLY PMN NO ORGANISMS SEEN CYTOSPIN SMEAR Performed at [facility], [address], [place], [place] [address]    Report Status 11/26/2024 FINAL  Final  Culture, body fluid w Gram Stain-bottle     Status: None   Collection Time: 11/26/24 10:00 AM   Specimen: Pleura  Result Value Ref Range Status   Specimen Description PLEURAL   Final   Special Requests PLEURAL  Final   Culture   Final    NO GROWTH 5 DAYS Performed at [facility], [address] [place], [place] [address]    Report Status 12/01/2024 FINAL  Final  Body fluid culture w Gram Stain     Status: None   Collection Time: 12/02/24  2:01 PM   Specimen: Path fluid; Body Fluid  Result Value Ref Range Status   Specimen Description FLUID  Final   Special Requests LUNG,RIGHT LOWER LOBE  Final   Gram Stain NO WBC SEEN NO ORGANISMS SEEN   Final   Culture   Final    NO GROWTH 3 DAYS Performed at [facility], [address] [place], [place] [address]    Report Status 12/05/2024 FINAL  Final         Radiology Studies: No results found.       Scheduled Meds:  calcium  carbonate  1 tablet Oral Daily   doxycycline   100 mg Oral Q12H   enoxaparin  (LOVENOX ) injection  40 mg Subcutaneous Q24H   fluticasone  furoate-vilanterol  1 puff Inhalation Daily   guaiFENesin   600 mg Oral BID   levothyroxine   100 mcg Oral Q0600   lidocaine  1 %  10 mL Other Once   lidocaine -EPINEPHrine   20 mL Infiltration Once   melatonin  6 mg Oral QHS   midodrine   5 mg Oral TID WC   nicotine   21 mg Transdermal Daily   ranolazine   500 mg Oral BID   rosuvastatin   40 mg Oral Daily   Continuous Infusions:  cefTRIAXone  (ROCEPHIN )  IV 2 g (12/05/24 0908)     LOS: 10 days       Renato Applebaum, MD [facility]   "

## 2024-12-05 NOTE — Plan of Care (Signed)

## 2024-12-05 NOTE — Significant Event (Signed)
 Please let pulmonary team know when the chest tube has been placed and we will follow the patient for administration of lytics if needed.

## 2024-12-05 NOTE — TOC Progression Note (Signed)
 Transition of Care [facility]) - Progression Note    Patient Details  Name: Whitney May MRN: [id number] Date of Birth: Oct 24, 1961  [facility] CM/SW Contact  Rosaline JONELLE Joe, RN Phone Number: 12/05/2024, 9:07 AM  Clinical Narrative:    CM met with the patient at the bedside.  Patient states that she lives at her own home and has daughter and son available for assistance when she returns home.  Patient is waiting for procedure in IR again today.  Patient remains on oxygen  in the hospital at this time.  Patient is an active smoker at home and plans to quit.  Patient currently wearing a nicotine  patch.  Resources were provided in the AVS for smoking cessation.  Patient to follow up with Dr. Alston, PCP when discharged from the hospital - noted in the AVS.  No other IP Care management needs at this time.     Barriers to Discharge: Continued Medical Work up               Expected Discharge Plan and Services                                               Social Drivers of Health (SDOH) Interventions SDOH Screenings   Food Insecurity: Food Insecurity Present (11/25/2024)  Housing: Unknown (11/25/2024)  Transportation Needs: No Transportation Needs (11/25/2024)  Utilities: Not At Risk (11/25/2024)  Social Connections: Socially Isolated (11/25/2024)  Tobacco Use: High Risk (12/02/2024)    Readmission Risk Interventions    11/26/2024   10:58 AM  Readmission Risk Prevention Plan  Transportation Screening Complete  Home Care Screening Complete  Medication Review (RN CM) Complete

## 2024-12-06 ENCOUNTER — Inpatient Hospital Stay (HOSPITAL_COMMUNITY)

## 2024-12-06 DIAGNOSIS — D721 Eosinophilia, unspecified: Secondary | ICD-10-CM | POA: Diagnosis not present

## 2024-12-06 DIAGNOSIS — J189 Pneumonia, unspecified organism: Secondary | ICD-10-CM | POA: Diagnosis not present

## 2024-12-06 DIAGNOSIS — J90 Pleural effusion, not elsewhere classified: Secondary | ICD-10-CM | POA: Diagnosis not present

## 2024-12-06 LAB — CBC WITH DIFFERENTIAL/PLATELET
Abs Immature Granulocytes: 1.28 10*3/uL — ABNORMAL HIGH (ref 0.00–0.07)
Basophils Absolute: 0.2 10*3/uL — ABNORMAL HIGH (ref 0.0–0.1)
Basophils Relative: 1 %
Eosinophils Absolute: 0.6 10*3/uL — ABNORMAL HIGH (ref 0.0–0.5)
Eosinophils Relative: 3 %
HCT: 40.4 % (ref 36.0–46.0)
Hemoglobin: 12.7 g/dL (ref 12.0–15.0)
Immature Granulocytes: 5 %
Lymphocytes Relative: 9 %
Lymphs Abs: 2.3 10*3/uL (ref 0.7–4.0)
MCH: 27 pg (ref 26.0–34.0)
MCHC: 31.4 g/dL (ref 30.0–36.0)
MCV: 85.8 fL (ref 80.0–100.0)
Monocytes Absolute: 3 10*3/uL — ABNORMAL HIGH (ref 0.1–1.0)
Monocytes Relative: 12 %
Neutro Abs: 17.4 10*3/uL — ABNORMAL HIGH (ref 1.7–7.7)
Neutrophils Relative %: 70 %
Platelets: 279 10*3/uL (ref 150–400)
RBC: 4.71 MIL/uL (ref 3.87–5.11)
RDW: 14.9 % (ref 11.5–15.5)
WBC: 24.8 10*3/uL — ABNORMAL HIGH (ref 4.0–10.5)
nRBC: 0 % (ref 0.0–0.2)

## 2024-12-06 MED ORDER — SODIUM CHLORIDE (PF) 0.9 % IJ SOLN
5.0000 mg | Freq: Once | INTRAMUSCULAR | Status: AC
  Filled 2024-12-06: qty 5

## 2024-12-06 MED ORDER — STERILE WATER FOR INJECTION IJ SOLN
5.0000 mg | Freq: Once | RESPIRATORY_TRACT | Status: AC
  Filled 2024-12-06: qty 5

## 2024-12-06 MED ORDER — SODIUM CHLORIDE 0.9% FLUSH
10.0000 mL | Freq: Three times a day (TID) | INTRAVENOUS | Status: DC
  Administered 2024-12-06 – 2024-12-08 (×7): 10 mL via INTRAPLEURAL

## 2024-12-06 MED ORDER — STERILE WATER FOR INJECTION IJ SOLN: 5.0000 mg | Freq: Once | RESPIRATORY_TRACT | Status: DC

## 2024-12-06 MED ORDER — TRANEXAMIC ACID FOR INHALATION
500.0000 mg | Freq: Once | RESPIRATORY_TRACT | Status: DC
  Filled 2024-12-06: qty 10

## 2024-12-06 MED ORDER — SODIUM CHLORIDE 0.9% FLUSH
10.0000 mL | Freq: Three times a day (TID) | INTRAVENOUS | Status: AC
  Administered 2024-12-06 – 2024-12-12 (×18): 10 mL via INTRAPLEURAL

## 2024-12-06 MED ORDER — METRONIDAZOLE 500 MG/100ML IV SOLN
500.0000 mg | Freq: Two times a day (BID) | INTRAVENOUS | Status: DC
  Administered 2024-12-06 – 2024-12-11 (×10): 500 mg via INTRAVENOUS
  Filled 2024-12-06 (×10): qty 100

## 2024-12-06 NOTE — Progress Notes (Signed)
 "  NAME:  Whitney May, MRN:  [id number], DOB:  1961-03-26, LOS: 11 ADMISSION DATE:  11/25/2024, CONSULTATION DATE:  11/29/24 REFERRING MD:  Tat, CHIEF COMPLAINT:  abnormal chest imaging    History of Present Illness:  64 yo F  PMH tobacco use, CAD with prior PCI on DAPT (ASA brilinta ), HFrEF, ICM, s/p ICD, who was admitted to [facility] at [facility] after presenting 1/21 w SOB/cough. Started tx for PNA and underwent thora 1/21 with IR for R pleural effusion.  Fluid was loculated and incompletely drained so transfer was requested to [facility] for chest tube placement.   Pt arrived days 1/24    Pertinent  Medical History  CAD ICM HFrEF S/p ICD   Significant Hospital Events: Including procedures, antibiotic start and stop dates in addition to other pertinent events   1/20 admit to [facility]  1/21 thora w IR 1/22 txf request to [facility] for chest tube 1/24 arrives to [facility].  1/27: Failed attempt at chest tube placement.  Removal of only 5 cc of fluid. CT chest 1. Small loculated right pleural effusion, decreased compared to the prior CT. Few small gas locules within the right posterior pleural collection. Partial consolidation in the right lower lobe, probable atelectasis. Decreased mediastinal adenopathy compared to prior.  Interim History / Subjective:   Patient doing ok but with discomfort where tube is located Reviewed morning chest radiograph with remaining small effusion and discussed pleural lytic therapy  Objective    Blood pressure 124/71, pulse 82, temperature (!) 97.5 F (36.4 C), temperature source Oral, resp. rate 16, height 5' 2 (1.575 m), weight 68.8 kg, SpO2 94%.        Intake/Output Summary (Last 24 hours) at 12/06/2024 1053 Last data filed at 12/06/2024 0526 Gross per 24 hour  Intake 238 ml  Output 18 ml  Net 220 ml   Filed Weights   12/04/24 0500 12/05/24 0401 12/06/24 0413  Weight: 67.2 kg 67.2 kg 68.8 kg    Examination: General: no distress, resting in bed Lungs: Lungs clear.  Chest tube in place - bloody drainage Heart: regular rate rhythm, no murmur appreciated Abdomen: non tender, non distended. Normal BS.  Neuro: axox 3.  All extremities.   Resolved problem list   Assessment and Plan   Right unilateral loculated effusion: Elevated eosinophil count in pleural fluid likely traumatic: Bacterial pneumonia  - Exudative right effusion - will treat with pleural lytic therapy, reduced dose of alteplase  as she is on dual antiplatelet therapy - Continue ceftriaxone  and doxycycline  antibiotic coverage.  - flush chest tube every 8-12 hours - Toxoplasma PCR pending.  Strongyloidiasis negative.  Pulmonary will follow.  Labs   CBC: Recent Labs  Lab 12/01/24 0037 12/02/24 0501 12/03/24 0947 12/05/24 0318 12/06/24 0712  WBC 23.2* 26.9* 21.1* 19.9* 24.8*  NEUTROABS  --   --  17.3* 11.7* 17.4*  HGB 12.8 12.1 12.5 11.6* 12.7  HCT 39.8 37.9 39.7 36.4 40.4  MCV 83.6 84.8 85.6 85.8 85.8  PLT 323 337 368 318 279    Basic Metabolic Panel: Recent Labs  Lab 11/30/24 0658 12/01/24 0037 12/02/24 0501 12/03/24 0703  NA 138 133* 137  --   K 4.9 4.3 4.3  --   CL 99 95* 101  --   CO2 30 28 28   --   GLUCOSE 89 227* 103*  --   BUN 11 22 16   --   CREATININE 1.14* 1.17* 0.89 0.99  CALCIUM  9.5 8.7* 8.7*  --  MG 2.1  --  2.2  --   PHOS 3.8  --  3.7  --    GFR: Estimated Creatinine Clearance: 54.3 mL/min (by C-G formula based on SCr of 0.99 mg/dL). Recent Labs  Lab 12/02/24 0501 12/03/24 0947 12/05/24 0318 12/06/24 0712  PROCALCITON <0.10  --   --   --   WBC 26.9* 21.1* 19.9* 24.8*    Liver Function Tests: No results for input(s): AST, ALT, ALKPHOS, BILITOT, PROT, ALBUMIN in the last 168 hours.  No results for input(s): LIPASE, AMYLASE in the last 168 hours. No results for input(s): AMMONIA in the last 168 hours.  ABG    Component Value Date/Time   TCO2 24 09/18/2022 1507     Coagulation Profile: No results for input(s):  INR, PROTIME in the last 168 hours.   Cardiac Enzymes: No results for input(s): CKTOTAL, CKMB, CKMBINDEX, TROPONINI in the last 168 hours.  HbA1C: Hgb A1c MFr Bld  Date/Time Value Ref Range Status  09/18/2022 03:13 PM 5.3 4.8 - 5.6 % Final    Comment:    (NOTE) Pre diabetes:          5.7%-6.4%  Diabetes:              >6.4%  Glycemic control for   <7.0% adults with diabetes   08/02/2018 05:33 PM 5.5 4.8 - 5.6 % Final    Comment:    (NOTE) Pre diabetes:          5.7%-6.4% Diabetes:              >6.4% Glycemic control for   <7.0% adults with diabetes     CBG: No results for input(s): GLUCAP in the last 168 hours.   Dorn Chill, MD [facility] Office: [phone]   See Amion for personal pager PCCM on call pager [phone] until 7pm. Please call Elink 7p-7a. [phone]           "

## 2024-12-06 NOTE — Procedures (Signed)
 Pleural Fibrinolytic Administration Procedure Note  Whitney May  [id number]  [birth year]  Date:12/06/24  Time:2:01 PM   Provider Performing:Tess Potts B Jonette Wassel   Procedure: Pleural Fibrinolysis Initial day [phone])  Indication(s) Fibrinolysis of complicated pleural effusion  Consent Risks of the procedure as well as the alternatives and risks of each were explained to the patient and/or caregiver.  Consent for the procedure was obtained.   Anesthesia None   Time Out Verified patient identification, verified procedure, site/side was marked, verified correct patient position, special equipment/implants available, medications/allergies/relevant history reviewed, required imaging and test results available.   Sterile Technique Hand hygiene, gloves   Procedure Description Existing pleural catheter was cleaned and accessed in sterile manner.  5mg  of tPA in 30cc of saline and 5mg  of dornase in 30cc of sterile water  were injected into pleural space using existing pleural catheter.  Catheter will be clamped for 1 hour and then placed back to suction.   Complications/Tolerance None; patient tolerated the procedure well.  EBL None   Specimen(s) None

## 2024-12-06 NOTE — Plan of Care (Signed)

## 2024-12-06 NOTE — Progress Notes (Signed)
 " PROGRESS NOTE    Whitney May  FMW:[id number] DOB: 31-Mar-1961 DOA: 11/25/2024 PCP: Alston Silvio BROCKS, FNP    Brief Narrative:  64 year old with history of chronic left bundle branch block, chronic diastolic dysfunction, coronary artery disease status post PCI and stenting on aspirin  and Brilinta , Ranexa , ischemic cardiomyopathy status post ICD, history of microperforation due to ICD, hyperlipidemia, hypothyroidism and ongoing smoker presented with 2 days of shortness of breath, cough and pleuritic chest pain.  In the emergency room afebrile.  Hemodynamically stable.  92% on 2 L oxygen .  WBC count 32.9.  CT angiogram of the chest was negative for PE but showed moderate right pleural effusion that was slightly loculated.  Patient was started on antibiotics.  Admitted with thoracentesis, pulmonary following.  Subjective:  Patient seen and examined.  She has some discomfort at the chest tube insertion site otherwise no other complaints.  Hurts to take deep breaths.  No drainage from the chest tube site.  Afebrile.  Still on 1 to 2 L of oxygen . WBC count 24 today.  Assessment & Plan:   Sepsis present on admission secondary to community-acquired pneumonia with loculated right pleural effusion  Empyema ruled out.   Presented with tachycardia, leukocytosis and tachypnea. Blood cultures negative.  Lactic acid normal. Procalcitonin less than 0.1. Initially on Rocephin  and azithromycin , currently on Rocephin  and doxycycline .    Initial pleural fluid analysis-no growth so far. WBC was trending up, pulmonary consulted.  Given 5 days of break from aspirin  and Brilinta -bedside thoracentesis attempted and was not successful.  Repeat fluid cultures no growth so far. 1/30, chest tube placed by IR.  Minimal drainage.  No cultures growth so far. Continue chest physiotherapy, incentive spirometry, deep breathing exercises, sputum induction, mucolytic's and bronchodilators. Supplemental oxygen  to keep saturations  more than 90%.  Mobilize in the hallway. Pulmonary following.  Chronic systolic heart failure due to ischemic cardiomyopathy,  Coronary artery disease status post stent on aspirin  and Brilinta  Status post ICD Chronic left bundle branch block  Cardiovascular she has remained stable. Off DAPT for procedure since 1/24.  Back on DAPT. Patient continued on statin, Ranexa ,  Hypothyroidism: On Synthroid .  Continue.  Smoker: Counseled to quit.  On nicotine  patch.     DVT prophylaxis: enoxaparin  (LOVENOX ) injection 40 mg Start: 12/03/24 1800   Code Status: Full code Family Communication: None at the bedside Disposition Plan: Status is: Inpatient Remains inpatient appropriate because: IV antibiotics, inpatient procedures     Consultants:  Pulmonary IR  Procedures:  Diagnostic pleural tap at the bedside Chest tube placement  Antimicrobials:  Rocephin  1/21--- Doxycycline  1/21--- 1/28     Objective: Vitals:   12/06/24 0041 12/06/24 0413 12/06/24 0450 12/06/24 0815  BP: 109/70  99/60 124/71  Pulse: 69  81 82  Resp: 17  18 16   Temp: (!) 97.5 F (36.4 C)  (!) 97.5 F (36.4 C) (!) 97.5 F (36.4 C)  TempSrc:    Oral  SpO2: 100%  94% 94%  Weight:  68.8 kg    Height:        Intake/Output Summary (Last 24 hours) at 12/06/2024 1126 Last data filed at 12/06/2024 0526 Gross per 24 hour  Intake 238 ml  Output 18 ml  Net 220 ml    Filed Weights   12/04/24 0500 12/05/24 0401 12/06/24 0413  Weight: 67.2 kg 67.2 kg 68.8 kg    Examination:  General exam: Appears calm and comfortable.  Not in any distress.  On minimal oxygen .  Respiratory system: Poor air entry at right base.  Respiratory effort normal.  Coughs on deep breathing.  Not in distress.  No accessory muscle use.  On 2 L oxygen . Chest tube on suction , no drainage  Cardiovascular system: S1 & S2 heard, RRR.  AICD present left precordium.  No pedal edema. Gastrointestinal system: Abdomen is nondistended, soft and  nontender. No organomegaly or masses felt. Normal bowel sounds heard.    Data Reviewed: I have personally reviewed following labs and imaging studies  CBC: Recent Labs  Lab 12/01/24 0037 12/02/24 0501 12/03/24 0947 12/05/24 0318 12/06/24 0712  WBC 23.2* 26.9* 21.1* 19.9* 24.8*  NEUTROABS  --   --  17.3* 11.7* 17.4*  HGB 12.8 12.1 12.5 11.6* 12.7  HCT 39.8 37.9 39.7 36.4 40.4  MCV 83.6 84.8 85.6 85.8 85.8  PLT 323 337 368 318 279   Basic Metabolic Panel: Recent Labs  Lab 11/30/24 0658 12/01/24 0037 12/02/24 0501 12/03/24 0703  NA 138 133* 137  --   K 4.9 4.3 4.3  --   CL 99 95* 101  --   CO2 30 28 28   --   GLUCOSE 89 227* 103*  --   BUN 11 22 16   --   CREATININE 1.14* 1.17* 0.89 0.99  CALCIUM  9.5 8.7* 8.7*  --   MG 2.1  --  2.2  --   PHOS 3.8  --  3.7  --    GFR: Estimated Creatinine Clearance: 54.3 mL/min (by C-G formula based on SCr of 0.99 mg/dL). Liver Function Tests: No results for input(s): AST, ALT, ALKPHOS, BILITOT, PROT, ALBUMIN in the last 168 hours.  No results for input(s): LIPASE, AMYLASE in the last 168 hours. No results for input(s): AMMONIA in the last 168 hours. Coagulation Profile: No results for input(s): INR, PROTIME in the last 168 hours. Cardiac Enzymes: No results for input(s): CKTOTAL, CKMB, CKMBINDEX, TROPONINI in the last 168 hours. BNP (last 3 results) No results for input(s): PROBNP in the last 8760 hours. HbA1C: No results for input(s): HGBA1C in the last 72 hours. CBG: No results for input(s): GLUCAP in the last 168 hours. Lipid Profile: No results for input(s): CHOL, HDL, LDLCALC, TRIG, CHOLHDL, LDLDIRECT in the last 72 hours. Thyroid  Function Tests: No results for input(s): TSH, T4TOTAL, FREET4, T3FREE, THYROIDAB in the last 72 hours. Anemia Panel: No results for input(s): VITAMINB12, FOLATE, FERRITIN, TIBC, IRON, RETICCTPCT in the last 72 hours. Sepsis  Labs: Recent Labs  Lab 12/02/24 0501  PROCALCITON <0.10    Recent Results (from the past 240 hours)  Body fluid culture w Gram Stain     Status: None   Collection Time: 12/02/24  2:01 PM   Specimen: Path fluid; Body Fluid  Result Value Ref Range Status   Specimen Description FLUID  Final   Special Requests LUNG,RIGHT LOWER LOBE  Final   Gram Stain NO WBC SEEN NO ORGANISMS SEEN   Final   Culture   Final    NO GROWTH 3 DAYS Performed at [facility], [address] [place], [place] [address]    Report Status 12/05/2024 FINAL  Final         Radiology Studies: IR [facility] PLEURAL DRAIN W/INDWELL CATH W/IMG GUIDE Result Date: 12/05/2024 INDICATION: Loculated right-sided pleural effusion. EXAM: Ultrasound guided drainage catheter placement MEDICATIONS: The patient is currently admitted to the hospital and receiving intravenous antibiotics. The antibiotics were administered within an appropriate time frame prior to the initiation of the  procedure. ANESTHESIA/SEDATION: Moderate (conscious) sedation was employed during this procedure. A total of Versed  2 mg and Fentanyl  100 mcg was administered intravenously by the radiology nurse. Total intra-service moderate Sedation Time: 13 minutes. The patient's level of consciousness and vital signs were monitored continuously by radiology nursing throughout the procedure under my direct supervision. COMPLICATIONS: None immediate. PROCEDURE: Informed written consent was obtained from the patient after a thorough discussion of the procedural risks, benefits and alternatives. All questions were addressed. Maximal Sterile Barrier Technique was utilized including caps, mask, sterile gowns, sterile gloves, sterile drape, hand hygiene and skin antiseptic. A timeout was performed prior to the initiation of the procedure. In a sitting position the posterior chest wall was prepped and draped usual sterile fashion. Local anesthesia was achieved with 1% lidocaine   by infiltrating the intercostal region to the pleura. A small incision was created intercostal fashion for needle access. The needle was then advanced into the fluid collection ultrasound guidance. The needle was then retrieved leaving the sheath in position. A short Amplatz wire was advanced under ultrasound guidance coiled within the pleural cavity. The sheath was then removed to be exchanged for a dilator. The dilator was removed and a 10 French pigtail catheter was advanced over the guidewire and coiled within the fluid collection. Stiffener and guidewire were retrieved. Catheter was connected to suction. Retention suture and sterile dressing applied. IMPRESSION: Satisfactory ultrasound-guided right sided 10 French pleural drainage catheter placement. Electronically Signed   By: Cordella Banner   On: 12/05/2024 12:23         Scheduled Meds:  aspirin  EC  81 mg Oral Daily   calcium  carbonate  1 tablet Oral Daily   doxycycline   100 mg Oral Q12H   enoxaparin  (LOVENOX ) injection  40 mg Subcutaneous Q24H   fluticasone  furoate-vilanterol  1 puff Inhalation Daily   guaiFENesin   600 mg Oral BID   levothyroxine   100 mcg Oral Q0600   lidocaine  1 %  10 mL Other Once   melatonin  6 mg Oral QHS   midodrine   5 mg Oral TID WC   nicotine   21 mg Transdermal Daily   ranolazine   500 mg Oral BID   rosuvastatin   40 mg Oral Daily   sodium chloride  flush  10 mL Intrapleural Q8H   sodium chloride  flush  5 mL Intracatheter Q8H   ticagrelor   90 mg Oral BID   Continuous Infusions:  cefTRIAXone  (ROCEPHIN )  IV 2 g (12/06/24 1000)     LOS: 11 days       Renato Applebaum, MD [facility]   "

## 2024-12-07 ENCOUNTER — Inpatient Hospital Stay (HOSPITAL_COMMUNITY)

## 2024-12-07 DIAGNOSIS — D721 Eosinophilia, unspecified: Secondary | ICD-10-CM | POA: Diagnosis not present

## 2024-12-07 DIAGNOSIS — J189 Pneumonia, unspecified organism: Secondary | ICD-10-CM | POA: Diagnosis not present

## 2024-12-07 DIAGNOSIS — J90 Pleural effusion, not elsewhere classified: Secondary | ICD-10-CM | POA: Diagnosis not present

## 2024-12-07 LAB — LACTIC ACID, PLASMA: Lactic Acid, Venous: 0.8 mmol/L (ref 0.5–1.9)

## 2024-12-07 LAB — CBC WITH DIFFERENTIAL/PLATELET
Basophils Absolute: 0.3 10*3/uL — ABNORMAL HIGH (ref 0.0–0.1)
Basophils Relative: 1 %
Eosinophils Absolute: 0 10*3/uL (ref 0.0–0.5)
Eosinophils Relative: 0 %
HCT: 39.9 % (ref 36.0–46.0)
Hemoglobin: 12.9 g/dL (ref 12.0–15.0)
Lymphocytes Relative: 6 %
Lymphs Abs: 1.8 10*3/uL (ref 0.7–4.0)
MCH: 27.4 pg (ref 26.0–34.0)
MCHC: 32.3 g/dL (ref 30.0–36.0)
MCV: 84.9 fL (ref 80.0–100.0)
Monocytes Absolute: 3 10*3/uL — ABNORMAL HIGH (ref 0.1–1.0)
Monocytes Relative: 10 %
Neutro Abs: 24.7 10*3/uL — ABNORMAL HIGH (ref 1.7–7.7)
Neutrophils Relative %: 83 %
Platelets: 241 10*3/uL (ref 150–400)
RBC: 4.7 MIL/uL (ref 3.87–5.11)
RDW: 15.3 % (ref 11.5–15.5)
WBC: 29.7 10*3/uL — ABNORMAL HIGH (ref 4.0–10.5)
nRBC: 0 % (ref 0.0–0.2)

## 2024-12-07 LAB — GLUCOSE, BODY FLUID OTHER: Glucose, Body Fluid Other: <2 mg/dL

## 2024-12-07 LAB — CBC
HCT: 37.1 % (ref 36.0–46.0)
Hemoglobin: 12 g/dL (ref 12.0–15.0)
MCH: 27.3 pg (ref 26.0–34.0)
MCHC: 32.3 g/dL (ref 30.0–36.0)
MCV: 84.5 fL (ref 80.0–100.0)
Platelets: 196 10*3/uL (ref 150–400)
RBC: 4.39 MIL/uL (ref 3.87–5.11)
RDW: 15.5 % (ref 11.5–15.5)
WBC: 22 10*3/uL — ABNORMAL HIGH (ref 4.0–10.5)
nRBC: 0 % (ref 0.0–0.2)

## 2024-12-07 NOTE — Plan of Care (Signed)

## 2024-12-07 NOTE — Progress Notes (Signed)
 " PROGRESS NOTE    Whitney May  FMW:[id number] DOB: 05-Feb-1961 DOA: 11/25/2024 PCP: Alston Silvio BROCKS, FNP    Brief Narrative:  64 year old with history of chronic left bundle branch block, chronic diastolic dysfunction, coronary artery disease status post PCI and stenting on aspirin  and Brilinta , Ranexa , ischemic cardiomyopathy status post ICD, history of microperforation due to ICD, hyperlipidemia, hypothyroidism and ongoing smoker presented with 2 days of shortness of breath, cough and pleuritic chest pain.  In the emergency room afebrile.  Hemodynamically stable.  92% on 2 L oxygen .  WBC count 32.9.  CT angiogram of the chest was negative for PE but showed moderate right pleural effusion that was slightly loculated.  Patient was started on antibiotics.  Admitted with thoracentesis, pulmonary following.  Subjective:  Patient seen and examined.  She had moderate amount of hemoptysis yesterday after tPA administration and clamping of the tube.  She denied any excessive shortness of breath, however it was annoying to cough up blood every time. WBC 29 Patient herself is afebrile. Seen and examined with PCCM provider at the bedside.  Patient is still coughing up blood but small amount and more of a dark blood. Repeat chest x-ray shows worsening of the effusion and also some more effusion on the left side.   Assessment & Plan:   Sepsis present on admission secondary to community-acquired pneumonia with loculated right pleural effusion  Empyema ruled out.   Presented with tachycardia, leukocytosis and tachypnea. Blood cultures negative.  Lactic acid normal. Procalcitonin less than 0.1. Initially on Rocephin  and azithromycin , then on Rocephin  and doxycycline .  Now on Rocephin  and Flagyl .      Initial pleural fluid analysis-no growth so far. WBC was trending up, pulmonary consulted.  Given 5 days of break from aspirin  and Brilinta -bedside thoracentesis attempted and was not successful.  Repeat  fluid cultures no growth so far. 1/30, chest tube placed by IR.  Minimal drainage.  No cultures growth so far. Continue chest physiotherapy, incentive spirometry, deep breathing exercises, sputum induction, mucolytic's and bronchodilators. Supplemental oxygen  to keep saturations more than 90%.  Mobilize in the hallway. Pulmonary following. If persistent or worsening effusion, pulmonary recommends CT surgery for VATS evaluation tomorrow.  Hemoptysis due to coagulopathy from aspirin , Brilinta , tPA and Lovenox .  Chronic systolic heart failure due to ischemic cardiomyopathy,  Coronary artery disease status post stent on aspirin  and Brilinta  Status post ICD Chronic left bundle branch block  Cardiovascular she has remained stable. Off DAPT for procedure since 1/24.  Back on DAPT-caused hemoptysis-paused since 1/31. Patient continued on statin, Ranexa ,  Hypothyroidism: On Synthroid .  Continue.  Smoker: Counseled to quit.  On nicotine  patch.     DVT prophylaxis:    Code Status: Full code Family Communication: None at the bedside Disposition Plan: Status is: Inpatient Remains inpatient appropriate because: IV antibiotics, inpatient procedures     Consultants:  Pulmonary IR  Procedures:  Diagnostic pleural tap at the bedside Chest tube placement  Antimicrobials:  Rocephin  1/21--- Doxycycline  1/21--- 1/28 Flagyl  1/31---     Objective: Vitals:   12/06/24 2015 12/06/24 2019 12/07/24 0448 12/07/24 0900  BP: 91/62  91/60 100/67  Pulse: 98 92 97 100  Resp:    17  Temp: 97.7 F (36.5 C)   97.6 F (36.4 C)  TempSrc: Oral   Oral  SpO2: (!) 89% 94% 95% 97%  Weight:      Height:        Intake/Output Summary (Last 24 hours) at 12/07/2024 1043  Last data filed at 12/07/2024 0600 Gross per 24 hour  Intake 800 ml  Output 100 ml  Net 700 ml    Filed Weights   12/04/24 0500 12/05/24 0401 12/06/24 0413  Weight: 67.2 kg 67.2 kg 68.8 kg    Examination:  General exam:  Appears calm and comfortable.  Slightly anxious.  On 2 L oxygen . Respiratory system: Conducted upper airway sounds.  Poor air entry at bases.  Not in any respiratory distress.  On 2 L oxygen . Chest tube with bloody drainage. Cardiovascular system: S1 & S2 heard, RRR.  AICD present left precordium.  No pedal edema. Gastrointestinal system: Abdomen is nondistended, soft and nontender. No organomegaly or masses felt. Normal bowel sounds heard.    Data Reviewed: I have personally reviewed following labs and imaging studies  CBC: Recent Labs  Lab 12/02/24 0501 12/03/24 0947 12/05/24 0318 12/06/24 0712 12/07/24 0355  WBC 26.9* 21.1* 19.9* 24.8* 29.7*  NEUTROABS  --  17.3* 11.7* 17.4* 24.7*  HGB 12.1 12.5 11.6* 12.7 12.9  HCT 37.9 39.7 36.4 40.4 39.9  MCV 84.8 85.6 85.8 85.8 84.9  PLT 337 368 318 279 241   Basic Metabolic Panel: Recent Labs  Lab 12/01/24 0037 12/02/24 0501 12/03/24 0703  NA 133* 137  --   K 4.3 4.3  --   CL 95* 101  --   CO2 28 28  --   GLUCOSE 227* 103*  --   BUN 22 16  --   CREATININE 1.17* 0.89 0.99  CALCIUM  8.7* 8.7*  --   MG  --  2.2  --   PHOS  --  3.7  --    GFR: Estimated Creatinine Clearance: 54.3 mL/min (by C-G formula based on SCr of 0.99 mg/dL). Liver Function Tests: No results for input(s): AST, ALT, ALKPHOS, BILITOT, PROT, ALBUMIN in the last 168 hours.  No results for input(s): LIPASE, AMYLASE in the last 168 hours. No results for input(s): AMMONIA in the last 168 hours. Coagulation Profile: No results for input(s): INR, PROTIME in the last 168 hours. Cardiac Enzymes: No results for input(s): CKTOTAL, CKMB, CKMBINDEX, TROPONINI in the last 168 hours. BNP (last 3 results) No results for input(s): PROBNP in the last 8760 hours. HbA1C: No results for input(s): HGBA1C in the last 72 hours. CBG: No results for input(s): GLUCAP in the last 168 hours. Lipid Profile: No results for input(s): CHOL,  HDL, LDLCALC, TRIG, CHOLHDL, LDLDIRECT in the last 72 hours. Thyroid  Function Tests: No results for input(s): TSH, T4TOTAL, FREET4, T3FREE, THYROIDAB in the last 72 hours. Anemia Panel: No results for input(s): VITAMINB12, FOLATE, FERRITIN, TIBC, IRON, RETICCTPCT in the last 72 hours. Sepsis Labs: Recent Labs  Lab 12/02/24 0501  PROCALCITON <0.10    Recent Results (from the past 240 hours)  Body fluid culture w Gram Stain     Status: None   Collection Time: 12/02/24  2:01 PM   Specimen: Path fluid; Body Fluid  Result Value Ref Range Status   Specimen Description FLUID  Final   Special Requests LUNG,RIGHT LOWER LOBE  Final   Gram Stain NO WBC SEEN NO ORGANISMS SEEN   Final   Culture   Final    NO GROWTH 3 DAYS Performed at [facility], [address] [place], [place] [address]    Report Status 12/05/2024 FINAL  Final         Radiology Studies: DG CHEST PORT 1 VIEW Result Date: 12/07/2024 EXAM: 1 VIEW(S) XRAY OF  THE CHEST 12/07/2024 07:16:00 AM COMPARISON: Comparison 12/06/2024. CLINICAL HISTORY: [id number] Chest tube in place 8778032; 142230 Pleural effusion 142230 Chest tube in place (8778032). Pleural effusion (142230). FINDINGS: LINES, TUBES AND DEVICES: There is a left chest wall pacer, which is unchanged from the previous exam. Right basilar pigtail thoracostomy tube is unchanged in location. LUNGS AND PLEURA: The known loculated right pleural effusion has increased in volume compared with the prior exam. Subsegmental atelectasis noted in the left base. No pneumothorax. HEART AND MEDIASTINUM: No acute abnormality of the cardiac and mediastinal silhouettes. BONES AND SOFT TISSUES: No acute osseous abnormality. IMPRESSION: 1. Increased volume of the known loculated right pleural effusion compared with the prior exam. 2. Right basilar pigtail thoracostomy tube unchanged in location. Electronically signed by: Waddell Calk MD 12/07/2024 07:19 AM  EST RP Workstation: [id number]   DG CHEST PORT 1 VIEW Result Date: 12/06/2024 EXAM: 1 VIEW(S) XRAY OF THE CHEST 12/06/2024 11:23:00 AM COMPARISON: 12/02/2024 CLINICAL HISTORY: Pleural effusion; chest tube in place. FINDINGS: LINES, TUBES AND DEVICES: New right basilar chest tube. Left chest wall pacemaker unchanged. LUNGS AND PLEURA: Persistent small right pleural effusion. Right basilar opacity, likely loculated pleural fluid. Small left pleural effusion. New left basilar atelectasis. No pneumothorax. HEART AND MEDIASTINUM: No acute abnormality of the cardiac and mediastinal silhouettes. BONES AND SOFT TISSUES: No acute osseous abnormality. IMPRESSION: 1. New right basilar chest tube and surgical drain in the epigastric region. 2. Trace right pleural effusion and right basilar opacity, likely loculated pleural fluid. 3. New small left pleural effusion and new left basilar atelectasis. Electronically signed by: Waddell Calk MD 12/06/2024 01:52 PM EST RP Workstation: [id number]   IR [facility] PLEURAL DRAIN W/INDWELL CATH W/IMG GUIDE Result Date: 12/05/2024 INDICATION: Loculated right-sided pleural effusion. EXAM: Ultrasound guided drainage catheter placement MEDICATIONS: The patient is currently admitted to the hospital and receiving intravenous antibiotics. The antibiotics were administered within an appropriate time frame prior to the initiation of the procedure. ANESTHESIA/SEDATION: Moderate (conscious) sedation was employed during this procedure. A total of Versed  2 mg and Fentanyl  100 mcg was administered intravenously by the radiology nurse. Total intra-service moderate Sedation Time: 13 minutes. The patient's level of consciousness and vital signs were monitored continuously by radiology nursing throughout the procedure under my direct supervision. COMPLICATIONS: None immediate. PROCEDURE: Informed written consent was obtained from the patient after a thorough discussion of the procedural risks, benefits and  alternatives. All questions were addressed. Maximal Sterile Barrier Technique was utilized including caps, mask, sterile gowns, sterile gloves, sterile drape, hand hygiene and skin antiseptic. A timeout was performed prior to the initiation of the procedure. In a sitting position the posterior chest wall was prepped and draped usual sterile fashion. Local anesthesia was achieved with 1% lidocaine  by infiltrating the intercostal region to the pleura. A small incision was created intercostal fashion for needle access. The needle was then advanced into the fluid collection ultrasound guidance. The needle was then retrieved leaving the sheath in position. A short Amplatz wire was advanced under ultrasound guidance coiled within the pleural cavity. The sheath was then removed to be exchanged for a dilator. The dilator was removed and a 10 French pigtail catheter was advanced over the guidewire and coiled within the fluid collection. Stiffener and guidewire were retrieved. Catheter was connected to suction. Retention suture and sterile dressing applied. IMPRESSION: Satisfactory ultrasound-guided right sided 10 French pleural drainage catheter placement. Electronically Signed   By: Cordella Banner   On: 12/05/2024 12:23  Scheduled Meds:  alteplase  (CATHFLO ACTIVASE ) 5 mg in sodium chloride  (PF) 0.9 % 25 mL  5 mg Intrapleural Once   And   dornase alfa  (PULMOZYME ) 5 mg in sterile water  (preservative free) 30 mL  5 mg Intrapleural Once   calcium  carbonate  1 tablet Oral Daily   doxycycline   100 mg Oral Q12H   fluticasone  furoate-vilanterol  1 puff Inhalation Daily   guaiFENesin   600 mg Oral BID   levothyroxine   100 mcg Oral Q0600   lidocaine  1 %  10 mL Other Once   melatonin  6 mg Oral QHS   midodrine   5 mg Oral TID WC   nicotine   21 mg Transdermal Daily   ranolazine   500 mg Oral BID   rosuvastatin   40 mg Oral Daily   sodium chloride  flush  10 mL Intrapleural Q8H   sodium chloride  flush  10 mL  Intrapleural Q8H   sodium chloride  flush  10 mL Intrapleural Q8H   sodium chloride  flush  5 mL Intracatheter Q8H   tranexamic acid   500 mg Nebulization Once   Continuous Infusions:  cefTRIAXone  (ROCEPHIN )  IV 2 g (12/07/24 0907)   metronidazole  500 mg (12/07/24 0428)     LOS: 12 days       Renato Applebaum, MD [facility]   "

## 2024-12-07 NOTE — Progress Notes (Signed)
 "  NAME:  Whitney May, MRN:  [id number], DOB:  12-29-1960, LOS: 12 ADMISSION DATE:  11/25/2024, CONSULTATION DATE:  11/29/24 REFERRING MD:  Tat, CHIEF COMPLAINT:  abnormal chest imaging    History of Present Illness:  64 yo F  PMH tobacco use, CAD with prior PCI on DAPT (ASA brilinta ), HFrEF, ICM, s/p ICD, who was admitted to TRH at [facility] after presenting 1/21 w SOB/cough. Started tx for PNA and underwent thora 1/21 with IR for R pleural effusion.  Fluid was loculated and incompletely drained so transfer was requested to [facility] campus for chest tube placement.   Pt arrived days 1/24    Pertinent  Medical History  CAD ICM HFrEF S/p ICD   Significant Hospital Events: Including procedures, antibiotic start and stop dates in addition to other pertinent events   1/20 admit to [facility]  1/21 thora w IR 1/22 txf request to [facility] for chest tube 1/24 arrives to [facility].  1/27: Failed attempt at chest tube placement.  Removal of only 5 cc of fluid. CT chest 1. Small loculated right pleural effusion, decreased compared to the prior CT. Few small gas locules within the right posterior pleural collection. Partial consolidation in the right lower lobe, probable atelectasis. Decreased mediastinal adenopathy compared to prior. 1/30 chest tube placement by IR 1/31 initial dose of pleural lytic therapy (reduced alteplase  dose)  Interim History / Subjective:   Patient with increasing pain on left CXR with increasing effusion on left Hemoptysis started yesterday after pleural lytic therapy   Objective    Blood pressure 91/60, pulse 97, temperature 97.7 F (36.5 C), temperature source Oral, resp. rate 16, height 5' 2 (1.575 m), weight 68.8 kg, SpO2 95%.        Intake/Output Summary (Last 24 hours) at 12/07/2024 0801 Last data filed at 12/07/2024 0600 Gross per 24 hour  Intake 800 ml  Output 100 ml  Net 700 ml   Filed Weights   12/04/24 0500 12/05/24 0401 12/06/24 0413  Weight: 67.2 kg 67.2 kg 68.8 kg     Examination: General: no distress, resting in bed Lungs: Lungs clear. Chest tube in place - bloody drainage, flushes both ways Heart: regular rate rhythm, no murmur appreciated Abdomen: non tender, non distended. Normal BS.  Neuro: axox 3.  All extremities.   Resolved problem list   Assessment and Plan   Right unilateral loculated effusion: Elevated eosinophil count in pleural fluid likely traumatic: Bacterial pneumonia  - Exudative right effusion - hold further pleural lytic therapy - effusion is worse today. Chest tube system is patent. Concern for bleeding complication from pleural lytic therapy.  - Repeat CXR tomorrow, if effusion no better, will consult CT Surgery for VATS evaluation - hold brillinta, prophylactic lovenox  and aspirin  81mg  - Continue ceftriaxone , doxycycline  and flagyl  antibiotic coverage.  - flush chest tube every 8-12 hours - Toxoplasma PCR pending.  Strongyloidiasis negative.  Pulmonary will follow.  Labs   CBC: Recent Labs  Lab 12/02/24 0501 12/03/24 0947 12/05/24 0318 12/06/24 0712 12/07/24 0355  WBC 26.9* 21.1* 19.9* 24.8* 29.7*  NEUTROABS  --  17.3* 11.7* 17.4* 24.7*  HGB 12.1 12.5 11.6* 12.7 12.9  HCT 37.9 39.7 36.4 40.4 39.9  MCV 84.8 85.6 85.8 85.8 84.9  PLT 337 368 318 279 241    Basic Metabolic Panel: Recent Labs  Lab 12/01/24 0037 12/02/24 0501 12/03/24 0703  NA 133* 137  --   K 4.3 4.3  --   CL 95* 101  --  CO2 28 28  --   GLUCOSE 227* 103*  --   BUN 22 16  --   CREATININE 1.17* 0.89 0.99  CALCIUM  8.7* 8.7*  --   MG  --  2.2  --   PHOS  --  3.7  --    GFR: Estimated Creatinine Clearance: 54.3 mL/min (by C-G formula based on SCr of 0.99 mg/dL). Recent Labs  Lab 12/02/24 0501 12/03/24 0947 12/05/24 0318 12/06/24 0712 12/07/24 0355  PROCALCITON <0.10  --   --   --   --   WBC 26.9* 21.1* 19.9* 24.8* 29.7*    Liver Function Tests: No results for input(s): AST, ALT, ALKPHOS, BILITOT, PROT,  ALBUMIN in the last 168 hours.  No results for input(s): LIPASE, AMYLASE in the last 168 hours. No results for input(s): AMMONIA in the last 168 hours.  ABG    Component Value Date/Time   TCO2 24 09/18/2022 1507     Coagulation Profile: No results for input(s): INR, PROTIME in the last 168 hours.   Cardiac Enzymes: No results for input(s): CKTOTAL, CKMB, CKMBINDEX, TROPONINI in the last 168 hours.  HbA1C: Hgb A1c MFr Bld  Date/Time Value Ref Range Status  09/18/2022 03:13 PM 5.3 4.8 - 5.6 % Final    Comment:    (NOTE) Pre diabetes:          5.7%-6.4%  Diabetes:              >6.4%  Glycemic control for   <7.0% adults with diabetes   08/02/2018 05:33 PM 5.5 4.8 - 5.6 % Final    Comment:    (NOTE) Pre diabetes:          5.7%-6.4% Diabetes:              >6.4% Glycemic control for   <7.0% adults with diabetes     CBG: No results for input(s): GLUCAP in the last 168 hours.   Dorn Chill, MD [place] [facility] Office: [phone]   See Amion for personal pager PCCM on call pager [phone] until 7pm. Please call Elink 7p-7a. [phone]           "

## 2024-12-08 ENCOUNTER — Inpatient Hospital Stay (HOSPITAL_COMMUNITY)

## 2024-12-08 DIAGNOSIS — J90 Pleural effusion, not elsewhere classified: Secondary | ICD-10-CM | POA: Diagnosis not present

## 2024-12-08 DIAGNOSIS — Z95810 Presence of automatic (implantable) cardiac defibrillator: Secondary | ICD-10-CM

## 2024-12-08 DIAGNOSIS — I5020 Unspecified systolic (congestive) heart failure: Secondary | ICD-10-CM

## 2024-12-08 DIAGNOSIS — F17210 Nicotine dependence, cigarettes, uncomplicated: Secondary | ICD-10-CM

## 2024-12-08 DIAGNOSIS — J189 Pneumonia, unspecified organism: Secondary | ICD-10-CM | POA: Diagnosis not present

## 2024-12-08 DIAGNOSIS — I2510 Atherosclerotic heart disease of native coronary artery without angina pectoris: Secondary | ICD-10-CM

## 2024-12-08 DIAGNOSIS — I255 Ischemic cardiomyopathy: Secondary | ICD-10-CM

## 2024-12-08 LAB — BASIC METABOLIC PANEL WITH GFR
Anion gap: 5 (ref 5–15)
BUN: 14 mg/dL (ref 8–23)
CO2: 31 mmol/L (ref 22–32)
Calcium: 8.3 mg/dL — ABNORMAL LOW (ref 8.9–10.3)
Chloride: 96 mmol/L — ABNORMAL LOW (ref 98–111)
Creatinine, Ser: 0.98 mg/dL (ref 0.44–1.00)
GFR, Estimated: >60 mL/min
Glucose, Bld: 121 mg/dL — ABNORMAL HIGH (ref 70–99)
Potassium: 3.6 mmol/L (ref 3.5–5.1)
Sodium: 132 mmol/L — ABNORMAL LOW (ref 135–145)

## 2024-12-08 MED ORDER — IOHEXOL 350 MG/ML SOLN
75.0000 mL | Freq: Once | INTRAVENOUS | Status: AC | PRN
  Administered 2024-12-08: 75 mL via INTRAVENOUS

## 2024-12-08 NOTE — Plan of Care (Signed)

## 2024-12-09 ENCOUNTER — Inpatient Hospital Stay (HOSPITAL_COMMUNITY)

## 2024-12-09 DIAGNOSIS — R042 Hemoptysis: Secondary | ICD-10-CM

## 2024-12-09 DIAGNOSIS — J90 Pleural effusion, not elsewhere classified: Secondary | ICD-10-CM | POA: Diagnosis not present

## 2024-12-09 DIAGNOSIS — J189 Pneumonia, unspecified organism: Secondary | ICD-10-CM | POA: Diagnosis not present

## 2024-12-09 LAB — CBC WITH DIFFERENTIAL/PLATELET
Abs Immature Granulocytes: 0.26 10*3/uL — ABNORMAL HIGH (ref 0.00–0.07)
Basophils Absolute: 0.1 10*3/uL (ref 0.0–0.1)
Basophils Relative: 0 %
Eosinophils Absolute: 0.6 10*3/uL — ABNORMAL HIGH (ref 0.0–0.5)
Eosinophils Relative: 4 %
HCT: 34.4 % — ABNORMAL LOW (ref 36.0–46.0)
Hemoglobin: 11.1 g/dL — ABNORMAL LOW (ref 12.0–15.0)
Immature Granulocytes: 2 %
Lymphocytes Relative: 11 %
Lymphs Abs: 1.7 10*3/uL (ref 0.7–4.0)
MCH: 27.5 pg (ref 26.0–34.0)
MCHC: 32.3 g/dL (ref 30.0–36.0)
MCV: 85.1 fL (ref 80.0–100.0)
Monocytes Absolute: 2.2 10*3/uL — ABNORMAL HIGH (ref 0.1–1.0)
Monocytes Relative: 14 %
Neutro Abs: 10.5 10*3/uL — ABNORMAL HIGH (ref 1.7–7.7)
Neutrophils Relative %: 69 %
Platelets: 190 10*3/uL (ref 150–400)
RBC: 4.04 MIL/uL (ref 3.87–5.11)
RDW: 15.8 % — ABNORMAL HIGH (ref 11.5–15.5)
WBC: 15.3 10*3/uL — ABNORMAL HIGH (ref 4.0–10.5)
nRBC: 0 % (ref 0.0–0.2)

## 2024-12-09 LAB — MAGNESIUM: Magnesium: 2 mg/dL (ref 1.7–2.4)

## 2024-12-09 LAB — BASIC METABOLIC PANEL WITH GFR
Anion gap: 7 (ref 5–15)
BUN: 11 mg/dL (ref 8–23)
CO2: 29 mmol/L (ref 22–32)
Calcium: 8.8 mg/dL — ABNORMAL LOW (ref 8.9–10.3)
Chloride: 99 mmol/L (ref 98–111)
Creatinine, Ser: 0.74 mg/dL (ref 0.44–1.00)
GFR, Estimated: >60 mL/min
Glucose, Bld: 89 mg/dL (ref 70–99)
Potassium: 3.8 mmol/L (ref 3.5–5.1)
Sodium: 134 mmol/L — ABNORMAL LOW (ref 135–145)

## 2024-12-09 LAB — PROTIME-INR
INR: 1.1 (ref 0.8–1.2)
Prothrombin Time: 14.9 s (ref 11.4–15.2)

## 2024-12-09 MED ORDER — FENTANYL CITRATE (PF) 100 MCG/2ML IJ SOLN
INTRAMUSCULAR | Status: AC | PRN
  Administered 2024-12-09 (×2): 25 ug via INTRAVENOUS
  Administered 2024-12-09: 50 ug via INTRAVENOUS

## 2024-12-09 MED ORDER — LORAZEPAM 1 MG PO TABS
1.0000 mg | ORAL_TABLET | Freq: Four times a day (QID) | ORAL | Status: AC | PRN
  Administered 2024-12-09 – 2024-12-11 (×4): 1 mg via ORAL
  Filled 2024-12-09 (×4): qty 1

## 2024-12-09 MED ORDER — FENTANYL CITRATE (PF) 100 MCG/2ML IJ SOLN
INTRAMUSCULAR | Status: AC
  Filled 2024-12-09: qty 4

## 2024-12-09 NOTE — Plan of Care (Signed)
   Problem: Education: Goal: Knowledge of General Education information will improve Description: Including pain rating scale, medication(s)/side effects and non-pharmacologic comfort measures Outcome: Progressing   Problem: Clinical Measurements: Goal: Ability to maintain clinical measurements within normal limits will improve Outcome: Progressing   Problem: Activity: Goal: Risk for activity intolerance will decrease Outcome: Progressing   Problem: Pain Managment: Goal: General experience of comfort will improve and/or be controlled Outcome: Progressing   Problem: Skin Integrity: Goal: Risk for impaired skin integrity will decrease Outcome: Progressing

## 2024-12-09 NOTE — Plan of Care (Signed)

## 2024-12-10 ENCOUNTER — Inpatient Hospital Stay (HOSPITAL_COMMUNITY)

## 2024-12-10 DIAGNOSIS — J189 Pneumonia, unspecified organism: Secondary | ICD-10-CM | POA: Diagnosis not present

## 2024-12-10 DIAGNOSIS — R042 Hemoptysis: Secondary | ICD-10-CM | POA: Diagnosis not present

## 2024-12-10 DIAGNOSIS — J90 Pleural effusion, not elsewhere classified: Secondary | ICD-10-CM | POA: Diagnosis not present

## 2024-12-10 NOTE — Progress Notes (Signed)
 " PROGRESS NOTE    Whitney May  FMW:[id number] DOB: [birth year] DOA: 11/25/2024 PCP: Alston Silvio BROCKS, FNP    Brief Narrative:  64 year old with history of chronic left bundle branch block, chronic diastolic dysfunction, coronary artery disease status post PCI and stenting on aspirin  and Brilinta , Ranexa , ischemic cardiomyopathy status post ICD, history of microperforation due to ICD, hyperlipidemia, hypothyroidism and ongoing smoker presented with 2 days of shortness of breath, cough and pleuritic chest pain.  In the emergency room afebrile.  Hemodynamically stable.  92% on 2 L oxygen .  WBC count 32.9.  CT angiogram of the chest was negative for PE but showed moderate right pleural effusion that was slightly loculated.  Patient was started on antibiotics.  Admitted with thoracentesis, pulmonary following. 1/30, chest tube placed with inadequate drainage. 2/3, chest tube reinserted and draining well.  Clinically improving.  Subjective:  Patient seen and examined.  She has some discomfort at the right posterior chest wall where chest tube was inserted. Remains afebrile. Able to take deep breaths. Chest x-ray was ordered and reviewed, shows improvement of aeration on the right lung base. Patient drained more than 500 mL since chest tube insertion. All cultures has been negative so far.   Assessment & Plan:   Sepsis present on admission secondary to community-acquired pneumonia with loculated right pleural effusion  Empyema ruled out.   Presented with tachycardia, leukocytosis and tachypnea. Blood cultures negative.  Lactic acid normal. Procalcitonin less than 0.1. Initially on Rocephin  and azithromycin , then on Rocephin  and doxycycline .  Now on Rocephin , doxycycline  and Flagyl .  Multiple fluid cultures negative. WBC was trending up, aspirin  Brilinta  was held and patient underwent chest tube placement. 1/30, chest tube placed by IR.  Minimal drainage.  No cultures growth so far. CT scan  2/3, large right-sided pleural effusion and chest tube at the lung parenchyma. 2/3, new chest tube placement into the pleural effusion-clinically improving-drained 500 cc. continue chest physiotherapy, incentive spirometry, deep breathing exercises, sputum induction, mucolytic's and bronchodilators. Supplemental oxygen  to keep saturations more than 90%.  Mobilize in the hallway. Pulmonary following. Hemoptysis due to coagulopathy from aspirin , Brilinta , tPA and Lovenox .  Improved.  DAPT on hold. Repeat blood count tomorrow. Seen by CT surgery, do not recommend VATS.  Chronic systolic heart failure due to ischemic cardiomyopathy,  Coronary artery disease status post stent on aspirin  and Brilinta  Status post ICD Chronic left bundle branch block  Cardiovascular she has remained stable. Off DAPT for procedure since 1/24.  Back on DAPT-caused hemoptysis-paused since 1/31. Patient continued on statin, Ranexa , stable.  Hypothyroidism: On Synthroid .  Continue.  Smoker: Counseled to quit.  On nicotine  patch.     DVT prophylaxis: SCDs.   Code Status: Full code Family Communication: None at the bedside.   Disposition Plan: Status is: Inpatient Remains inpatient appropriate because: IV antibiotics, inpatient procedures     Consultants:  Pulmonary IR CT CS  Procedures:  Diagnostic pleural tap at the bedside Chest tube placement  Antimicrobials:  Rocephin  1/21--- Doxycycline  1/21--- 1/28 Flagyl  1/31---     Objective: Vitals:   12/09/24 2044 12/10/24 0025 12/10/24 0419 12/10/24 0447  BP: (!) 97/53 107/60  (!) 100/57  Pulse:  81  81  Resp:  17  17  Temp:  98 F (36.7 C)  98.2 F (36.8 C)  TempSrc:      SpO2:  97%  97%  Weight:   69.7 kg   Height:        Intake/Output Summary (Last  24 hours) at 12/10/2024 1115 Last data filed at 12/10/2024 0800 Gross per 24 hour  Intake 120 ml  Output 665 ml  Net -545 ml    Filed Weights   12/07/24 0702 12/09/24 0500 12/10/24  0419  Weight: 70.1 kg 70.1 kg 69.7 kg    Examination:  General exam: Pleasant and interactive. Respiratory system: Mostly bilaterally clear.  No added sounds.  Good air entry bilateral.  Chest tube right posterior lung base with some serosanguineous drainage. Cardiovascular system: S1 & S2 heard, RRR.  AICD present left precordium.  No pedal edema. Gastrointestinal system: Abdomen is nondistended, soft and nontender. No organomegaly or masses felt. Normal bowel sounds heard.    Data Reviewed: I have personally reviewed following labs and imaging studies  CBC: Recent Labs  Lab 12/05/24 0318 12/06/24 0712 12/07/24 0355 12/07/24 2218 12/09/24 0512  WBC 19.9* 24.8* 29.7* 22.0* 15.3*  NEUTROABS 11.7* 17.4* 24.7*  --  10.5*  HGB 11.6* 12.7 12.9 12.0 11.1*  HCT 36.4 40.4 39.9 37.1 34.4*  MCV 85.8 85.8 84.9 84.5 85.1  PLT 318 279 241 196 190   Basic Metabolic Panel: Recent Labs  Lab 12/08/24 2010 12/09/24 0512  NA 132* 134*  K 3.6 3.8  CL 96* 99  CO2 31 29  GLUCOSE 121* 89  BUN 14 11  CREATININE 0.98 0.74  CALCIUM  8.3* 8.8*  MG  --  2.0   GFR: Estimated Creatinine Clearance: 67.5 mL/min (by C-G formula based on SCr of 0.74 mg/dL). Liver Function Tests: No results for input(s): AST, ALT, ALKPHOS, BILITOT, PROT, ALBUMIN in the last 168 hours.  No results for input(s): LIPASE, AMYLASE in the last 168 hours. No results for input(s): AMMONIA in the last 168 hours. Coagulation Profile: Recent Labs  Lab 12/09/24 1325  INR 1.1   Cardiac Enzymes: No results for input(s): CKTOTAL, CKMB, CKMBINDEX, TROPONINI in the last 168 hours. BNP (last 3 results) No results for input(s): PROBNP in the last 8760 hours. HbA1C: No results for input(s): HGBA1C in the last 72 hours. CBG: No results for input(s): GLUCAP in the last 168 hours. Lipid Profile: No results for input(s): CHOL, HDL, LDLCALC, TRIG, CHOLHDL, LDLDIRECT in the last 72  hours. Thyroid  Function Tests: No results for input(s): TSH, T4TOTAL, FREET4, T3FREE, THYROIDAB in the last 72 hours. Anemia Panel: No results for input(s): VITAMINB12, FOLATE, FERRITIN, TIBC, IRON, RETICCTPCT in the last 72 hours. Sepsis Labs: Recent Labs  Lab 12/07/24 2218  LATICACIDVEN 0.8    Recent Results (from the past 240 hours)  Body fluid culture w Gram Stain     Status: None   Collection Time: 12/02/24  2:01 PM   Specimen: Path fluid; Body Fluid  Result Value Ref Range Status   Specimen Description FLUID  Final   Special Requests LUNG,RIGHT LOWER LOBE  Final   Gram Stain NO WBC SEEN NO ORGANISMS SEEN   Final   Culture   Final    NO GROWTH 3 DAYS Performed at [facility], [address] [place], [place] [address]    Report Status 12/05/2024 FINAL  Final         Radiology Studies: CT [facility] PLEURAL DRAIN W/INDWELL CATH W/IMG GUIDE Result Date: 12/10/2024 INDICATION: 64 year old female with enlarging right pleural effusion status post ultrasound-guided right thoracostomy tube placement on 12/05/2024. EXAM: CT [facility] PLEURAL DRAIN W/INDWELL CATH W/IMG GUIDE COMPARISON:  12/08/2024 MEDICATIONS: The patient is currently admitted to the hospital and receiving intravenous antibiotics. The antibiotics were  administered within an appropriate time frame prior to the initiation of the procedure. ANESTHESIA/SEDATION: Moderate (conscious) sedation was employed during this procedure. A total of Versed  0 mg and Fentanyl  100 mcg was administered intravenously. Moderate Sedation Time: 30 minutes. The patient's level of consciousness and vital signs were monitored continuously by radiology nursing throughout the procedure under my direct supervision. CONTRAST:  None COMPLICATIONS: None immediate. PROCEDURE: RADIATION DOSE REDUCTION: This exam was performed according to the departmental dose-optimization program which includes automated exposure control, adjustment  of the mA and/or kV according to patient size and/or use of iterative reconstruction technique. Informed written consent was obtained from the patient after a discussion of the risks, benefits and alternatives to treatment. The patient was placed left lateral decubitus on the CT gantry and a pre procedural CT was performed re-demonstrating the known fluid collection within the right pleural space. The procedure was planned. A timeout was performed prior to the initiation of the procedure. The right posterolateral and inferior thorax was prepped and draped in the usual sterile fashion. The overlying soft tissues were anesthetized with 1% lidocaine  with epinephrine . Appropriate trajectory was planned with the use of a 22 gauge spinal needle. An 18 gauge trocar needle was advanced into the pleural space and a short Amplatz super stiff wire was coiled within the collection. Appropriate positioning was confirmed with a limited CT scan. The tract was serially dilated allowing placement of a 14 French all-purpose drainage catheter. Appropriate positioning was confirmed with a limited postprocedural CT scan. A total of approximately 400 ml of sanguinous fluid was aspirated. The malpositioned, indwelling thoracostomy tube was then removed without complication. Postprocedure limited chest CT demonstrated decreased volume of pleural effusion without evidence of pneumothorax. The tube was connected to a pleurovac and sutured in place. A dressing was placed. The patient tolerated the procedure well without immediate post procedural complication. IMPRESSION: Successful CT guided placement of a 61 French all purpose drain catheter into the posterolateral and inferior right pleural space with aspiration of 400 cc of sanguinous fluid. The previously placed indwelling thoracostomy tube was removed without complication. Ester Sides, MD [facility] Electronically Signed   By:  Ester Sides M.D.   On: 12/10/2024 09:16   CT CHEST W CONTRAST Result Date: 12/09/2024 CLINICAL DATA:  Pleural effusion. EXAM: CT CHEST WITH CONTRAST TECHNIQUE: Multidetector CT imaging of the chest was performed during intravenous contrast administration. RADIATION DOSE REDUCTION: This exam was performed according to the departmental dose-optimization program which includes automated exposure control, adjustment of the mA and/or kV according to patient size and/or use of iterative reconstruction technique. CONTRAST:  75mL OMNIPAQUE  IOHEXOL  350 MG/ML SOLN COMPARISON:  12/02/2024 FINDINGS: Cardiovascular: Heart size upper normal. No substantial pericardial effusion. Left-sided permanent pacemaker/AICD evident. Coronary artery calcification is evident. Mild atherosclerotic calcification is noted in the wall of the thoracic aorta. Mediastinum/Nodes: Scattered small to upper normal mediastinal and right hilar lymph nodes evident. The esophagus has normal imaging features. There is no axillary lymphadenopathy. Lungs/Pleura: Centrilobular and paraseptal emphysema evident. 3 mm left upper lobe nodule on 30/4 is stable. Loculated right-sided pleural effusion is progressive since prior, now moderate to large in size. Areas of pleural enhancement are seen in the right hemithorax. Collapse/consolidation noted right lower lobe. Right pigtail pleural drain is identified with the distal loop apparently formed in the parenchyma of the right lower lobe and may be in a fissure. Gas in the right pleural fluid is likely related to the drain  placement although infection could produce this appearance. Upper Abdomen: Visualized portion of the upper abdomen shows no acute findings. Musculoskeletal: No worrisome lytic or sclerotic osseous abnormality. IMPRESSION: 1. Loculated right-sided pleural effusion is progressive since prior chest CT, now moderate to large in size. Areas of pleural enhancement are seen in the right hemithorax  raising concern for infection or neoplasm. 2. Right pigtail pleural drain is identified with the distal loop apparently formed in the parenchyma of the right lower lobe and potentially in a fissure. If the pleural tube is not draining, repositioning may be warranted. 3. Collapse/consolidation right lower lobe. 4. Stable 3 mm left upper lobe pulmonary nodule. Most likely benign. Attention on follow-up recommended. 5. Aortic Atherosclerosis (ICD10-I70.0) and Emphysema (ICD10-J43.9). Electronically Signed   By: Camellia Candle M.D.   On: 12/09/2024 07:44         Scheduled Meds:  alteplase  (CATHFLO ACTIVASE ) 5 mg in sodium chloride  (PF) 0.9 % 25 mL  5 mg Intrapleural Once   And   dornase alfa  (PULMOZYME ) 5 mg in sterile water  (preservative free) 30 mL  5 mg Intrapleural Once   calcium  carbonate  1 tablet Oral Daily   doxycycline   100 mg Oral Q12H   fluticasone  furoate-vilanterol  1 puff Inhalation Daily   guaiFENesin   600 mg Oral BID   levothyroxine   100 mcg Oral Q0600   lidocaine  1 %  10 mL Other Once   melatonin  6 mg Oral QHS   midodrine   5 mg Oral TID WC   nicotine   21 mg Transdermal Daily   ranolazine   500 mg Oral BID   rosuvastatin   40 mg Oral Daily   sodium chloride  flush  10 mL Intrapleural Q8H   Continuous Infusions:  cefTRIAXone  (ROCEPHIN )  IV 2 g (12/10/24 1005)   metronidazole  500 mg (12/10/24 0419)     LOS: 15 days       Renato Applebaum, MD [facility]   "

## 2024-12-10 NOTE — Progress Notes (Signed)
 "  NAME:  Whitney May, MRN:  [id number], DOB:  1961-03-24, LOS: 15 ADMISSION DATE:  11/25/2024, CONSULTATION DATE:  11/29/24 REFERRING MD:  Tat, CHIEF COMPLAINT:  abnormal chest imaging    History of Present Illness:  64 yo F  PMH tobacco use, CAD with prior PCI on DAPT (ASA brilinta ), HFrEF, ICM, s/p ICD, who was admitted to TRH at [facility] after presenting 1/21 w SOB/cough. Started tx for PNA and underwent thora 1/21 with IR for R pleural effusion.  Fluid was loculated and incompletely drained so transfer was requested to [facility] campus for chest tube placement.   Pt arrived days 1/24    Pertinent  Medical History  CAD ICM HFrEF S/p ICD   Significant Hospital Events: Including procedures, antibiotic start and stop dates in addition to other pertinent events   1/20 admit to [facility]  1/21 thora w IR 1/22 txf request to [facility] for chest tube 1/24 arrives to [facility].  1/27: Failed attempt at chest tube placement.  Removal of only 5 cc of fluid. CT chest 1. Small loculated right pleural effusion, decreased compared to the prior CT. Few small gas locules within the right posterior pleural collection. Partial consolidation in the right lower lobe, probable atelectasis. Decreased mediastinal adenopathy compared to prior. 1/30 chest tube placement by IR 1/31 initial dose of pleural lytic therapy (reduced alteplase  dose) 2/1 increase in pleural effusion, hemoptysis developed after lytics 2/2 CT Surgery Consulted, repeat CT Chest with contrast performed  Interim History / Subjective:   Patient had new CT guided IR chest tube placed yesterday Old chest tube removed with concern for being placed intraparenchymally  She reports episode of hemoptysis yesterday and today, dark clotted blood.  Breathing feels better, still having significant discomfort.   Objective    Blood pressure (!) 100/57, pulse 81, temperature 98.2 F (36.8 C), resp. rate 17, height 5' 2 (1.575 m), weight 69.7 kg, SpO2 97%.         Intake/Output Summary (Last 24 hours) at 12/10/2024 0752 Last data filed at 12/10/2024 0432 Gross per 24 hour  Intake 130 ml  Output 365 ml  Net -235 ml    Filed Weights   12/07/24 0702 12/09/24 0500 12/10/24 0419  Weight: 70.1 kg 70.1 kg 69.7 kg    Examination: General: no distress, resting in bed Lungs: Lungs diminished right base. Chest tube in place, serosanguinous drainage Heart: regular rate rhythm, no murmur appreciated Abdomen: non tender, non distended. Normal BS.  Neuro: axox 3.  All extremities. Ext: no edema  Resolved problem list   Assessment and Plan   Right loculated effusion Bacterial pneumonia Hemoptysis  - Exudative right effusion s/p chest tube placement 2/3 and removal of old chest tube - She continues to have apical appearing fluid collection, may require a second chest tube for drainage - Consulted CT Surgery for VATS evaluation, no plan for VATS at this time. - hold further pleural lytic therapy - hold brillinta, prophylactic lovenox  and aspirin  81mg  - Continue ceftriaxone , doxycycline  and flagyl  antibiotic coverage.  - flush chest tube every 8-12 hours - Toxoplasma PCR pending.  Strongyloidiasis negative.  Pulmonary will follow.  Labs   CBC: Recent Labs  Lab 12/03/24 0947 12/05/24 0318 12/06/24 0712 12/07/24 0355 12/07/24 2218 12/09/24 0512  WBC 21.1* 19.9* 24.8* 29.7* 22.0* 15.3*  NEUTROABS 17.3* 11.7* 17.4* 24.7*  --  10.5*  HGB 12.5 11.6* 12.7 12.9 12.0 11.1*  HCT 39.7 36.4 40.4 39.9 37.1 34.4*  MCV 85.6 85.8 85.8  84.9 84.5 85.1  PLT 368 318 279 241 196 190    Basic Metabolic Panel: Recent Labs  Lab 12/08/24 2010 12/09/24 0512  NA 132* 134*  K 3.6 3.8  CL 96* 99  CO2 31 29  GLUCOSE 121* 89  BUN 14 11  CREATININE 0.98 0.74  CALCIUM  8.3* 8.8*  MG  --  2.0   GFR: Estimated Creatinine Clearance: 67.5 mL/min (by C-G formula based on SCr of 0.74 mg/dL). Recent Labs  Lab 12/06/24 0712 12/07/24 0355 12/07/24 2218  12/09/24 0512  WBC 24.8* 29.7* 22.0* 15.3*  LATICACIDVEN  --   --  0.8  --     Liver Function Tests: No results for input(s): AST, ALT, ALKPHOS, BILITOT, PROT, ALBUMIN in the last 168 hours.  No results for input(s): LIPASE, AMYLASE in the last 168 hours. No results for input(s): AMMONIA in the last 168 hours.  ABG    Component Value Date/Time   TCO2 24 09/18/2022 1507     Coagulation Profile: Recent Labs  Lab 12/09/24 1325  INR 1.1     Cardiac Enzymes: No results for input(s): CKTOTAL, CKMB, CKMBINDEX, TROPONINI in the last 168 hours.  HbA1C: Hgb A1c MFr Bld  Date/Time Value Ref Range Status  09/18/2022 03:13 PM 5.3 4.8 - 5.6 % Final    Comment:    (NOTE) Pre diabetes:          5.7%-6.4%  Diabetes:              >6.4%  Glycemic control for   <7.0% adults with diabetes   08/02/2018 05:33 PM 5.5 4.8 - 5.6 % Final    Comment:    (NOTE) Pre diabetes:          5.7%-6.4% Diabetes:              >6.4% Glycemic control for   <7.0% adults with diabetes     CBG: No results for input(s): GLUCAP in the last 168 hours.   Dorn Chill, MD [place] [facility] Office: [phone]   See Amion for personal pager PCCM on call pager [phone] until 7pm. Please call Elink 7p-7a. [phone]           "

## 2024-12-10 NOTE — Progress Notes (Signed)
 "   Referring Physician(s): * No referring provider recorded for this case *  Supervising Physician: Philip Cornet  Patient Status:  [facility] - In-pt  Chief Complaint:  Complex right sided pleural effusion  Subjective:  Whitney May is a 64 yo female with a history of LBBB, chronic CHF, CAD s/p PCI with stenting, MI, ischemic cardiomyopathy, ICD with RV ICD microperforation and HLD who presented to the [facility] ED on 11/25/24 with shortness of breath. She was found to have loculated right pleural effusion 2/2 pneumonia. IR placed a 10 Fr right sided chest tube on 12/05/24. CT chest from 12/09/24 showed that the right pigtail pleural drain was in the parenchyma of the right lower lobe. A new pigtail was placed on 12/09/24 and has been draining 240 mL of serosanguinous fluid since placement. Patient endorses hemoptysis the morning of 12/10/24. Patient did not report any hemoptysis since the morning. She also endorses dyspnea with exertion but not at rest. She denies any chest pain, palpitations, fever, chills, nausea or vomiting.    Allergies: Penicillins  Medications: Prior to Admission medications  Medication Sig Start Date End Date Taking? Authorizing Provider  acetaminophen  (TYLENOL ) 500 MG tablet Take 1,000 mg by mouth every 6 (six) hours as needed for moderate pain (pain score 4-6).   Yes [provider]  aspirin  81 MG chewable tablet Chew 1 tablet (81 mg total) by mouth daily. 06/08/23  Yes Milford, Harlene HERO, FNP  bisoprolol  (ZEBETA ) 5 MG tablet TAKE 1/2 TABLET BY MOUTH ONCE DAILY 10/16/24  Yes McLean, Dalton S, MD  calcium  carbonate (TUMS - DOSED IN MG ELEMENTAL CALCIUM ) 500 MG chewable tablet Chew 1 tablet by mouth daily as needed for indigestion or heartburn.   Yes [provider]  empagliflozin  (JARDIANCE ) 10 MG TABS tablet TAKE 1 TABLET BY MOUTH DAILY BEFORE BREAKFAST 11/14/24  Yes Rolan Ezra RAMAN, MD  FT NICOTINE  21 MG/24HR patch Place 21 mg onto the skin daily. 10/14/24  Yes  [provider]  furosemide  (LASIX ) 20 MG tablet Take 1 tablet (20 mg total) by mouth daily as needed for weight gain of 3 lbs in 24 hours or 5 lbs in a week 04/02/24 11/26/24 Yes Lee, Jordan, NP  isosorbide  mononitrate (IMDUR ) 30 MG 24 hr tablet TAKE 1 TABLET BY MOUTH DAILY 10/16/24  Yes McLean, Dalton S, MD  levothyroxine  (SYNTHROID ) 75 MCG tablet Take 75 mcg by mouth every morning. 11/17/24  Yes [provider]  losartan  (COZAAR ) 25 MG tablet TAKE 1/2 TABLET BY MOUTH DAILY 09/04/24  Yes Rolan Ezra RAMAN, MD  nitroGLYCERIN  (NITROSTAT ) 0.4 MG SL tablet Place 1 tablet (0.4 mg total) under the tongue every 5 (five) minutes x 3 doses as needed for chest pain. 07/05/23  Yes Rolan Ezra RAMAN, MD  ranolazine  (RANEXA ) 500 MG 12 hr tablet Take 1 tablet (500 mg total) by mouth 2 (two) times daily. PLEASE SCHEDULE APPOINTMENT FOR MORE REFILLS [phone] OPTION 2 11/14/24  Yes Rolan Ezra RAMAN, MD  rosuvastatin  (CRESTOR ) 40 MG tablet Take 1 tablet (40 mg total) by mouth daily. PLEASE SCHEDULE APPOINTMENT FOR MORE REFILLS [phone] OPTION 2 10/27/24  Yes Rolan Ezra RAMAN, MD  ticagrelor  (BRILINTA ) 90 MG TABS tablet Take 1 tablet (90 mg total) by mouth 2 (two) times daily. PLEASE SCHEDULE APPOINTMENT FOR MORE REFILLS [phone] OPTION 2 09/08/24  Yes Rolan Ezra RAMAN, MD  Vitamin D , Ergocalciferol , (DRISDOL) 1.25 MG (50000 UNIT) CAPS capsule Take 50,000 Units by mouth once a week. 10/13/24  Yes  [provider]     Vital Signs: BP 111/61 (BP Location: Left Arm)   Pulse 73   Temp 98 F (36.7 C) (Oral)   Resp 17   Ht 5' 2 (1.575 m)   Wt 153 lb 10.6 oz (69.7 kg)   SpO2 99%   BMI 28.10 kg/m   Physical Exam Constitutional:      General: She is not in acute distress.    Appearance: She is well-developed. She is obese. She is not ill-appearing, toxic-appearing or diaphoretic.  HENT:     Head: Normocephalic and atraumatic.  Cardiovascular:     Rate and Rhythm: Normal rate and  regular rhythm.     Heart sounds: No murmur heard.    No friction rub. No gallop.  Pulmonary:     Effort: Pulmonary effort is normal. No respiratory distress.     Breath sounds: Examination of the right-middle field reveals rhonchi. Rhonchi present.  Chest:     Comments: Chest tube insertion site intact and not erythematous. Suture in place and without any purulent drainage. Skin:    General: Skin is warm and dry.  Neurological:     Mental Status: She is alert.     Imaging: DG CHEST PORT 1 VIEW Result Date: 12/10/2024 CLINICAL DATA:  Right pleural effusion. EXAM: PORTABLE CHEST 1 VIEW COMPARISON:  Radiograph and CT 12/08/2024 FINDINGS: Pigtail catheter is coiled laterally in the lower hemithorax. Diminished right pleural effusion. Persistent pleural fluid tracks laterally, at the lung base, and is loculated in the major fissure. No demonstrated pneumothorax. Multi lead left-sided pacemaker in place. The left lung is clear. Mild cardiomegaly is stable. No pulmonary edema. IMPRESSION: Pigtail catheter coiled laterally in the lower right hemithorax. Diminished right pleural effusion. Persistent pleural fluid tracks laterally, at the lung base, and is loculated in the major fissure. Electronically Signed   By: Andrea Gasman M.D.   On: 12/10/2024 12:00   CT PERC PLEURAL DRAIN W/INDWELL CATH W/IMG GUIDE Result Date: 12/10/2024 INDICATION: 64 year old female with enlarging right pleural effusion status post ultrasound-guided right thoracostomy tube placement on 12/05/2024. EXAM: CT PERC PLEURAL DRAIN W/INDWELL CATH W/IMG GUIDE COMPARISON:  12/08/2024 MEDICATIONS: The patient is currently admitted to the hospital and receiving intravenous antibiotics. The antibiotics were administered within an appropriate time frame prior to the initiation of the procedure. ANESTHESIA/SEDATION: Moderate (conscious) sedation was employed during this procedure. A total of Versed  0 mg and Fentanyl  100 mcg was administered  intravenously. Moderate Sedation Time: 30 minutes. The patient's level of consciousness and vital signs were monitored continuously by radiology nursing throughout the procedure under my direct supervision. CONTRAST:  None COMPLICATIONS: None immediate. PROCEDURE: RADIATION DOSE REDUCTION: This exam was performed according to the departmental dose-optimization program which includes automated exposure control, adjustment of the mA and/or kV according to patient size and/or use of iterative reconstruction technique. Informed written consent was obtained from the patient after a discussion of the risks, benefits and alternatives to treatment. The patient was placed left lateral decubitus on the CT gantry and a pre procedural CT was performed re-demonstrating the known fluid collection within the right pleural space. The procedure was planned. A timeout was performed prior to the initiation of the procedure. The right posterolateral and inferior thorax was prepped and draped in the usual sterile fashion. The overlying soft tissues were anesthetized with 1% lidocaine  with epinephrine . Appropriate trajectory was planned with the use of a 22 gauge spinal needle. An 18 gauge trocar needle was advanced into  the pleural space and a short Amplatz super stiff wire was coiled within the collection. Appropriate positioning was confirmed with a limited CT scan. The tract was serially dilated allowing placement of a 14 French all-purpose drainage catheter. Appropriate positioning was confirmed with a limited postprocedural CT scan. A total of approximately 400 ml of sanguinous fluid was aspirated. The malpositioned, indwelling thoracostomy tube was then removed without complication. Postprocedure limited chest CT demonstrated decreased volume of pleural effusion without evidence of pneumothorax. The tube was connected to a pleurovac and sutured in place. A dressing was placed. The patient tolerated the procedure well without  immediate post procedural complication. IMPRESSION: Successful CT guided placement of a 53 French all purpose drain catheter into the posterolateral and inferior right pleural space with aspiration of 400 cc of sanguinous fluid. The previously placed indwelling thoracostomy tube was removed without complication. Ester Sides, MD [facility] Electronically Signed   By: Ester Sides M.D.   On: 12/10/2024 09:16   CT CHEST W CONTRAST Result Date: 12/09/2024 CLINICAL DATA:  Pleural effusion. EXAM: CT CHEST WITH CONTRAST TECHNIQUE: Multidetector CT imaging of the chest was performed during intravenous contrast administration. RADIATION DOSE REDUCTION: This exam was performed according to the departmental dose-optimization program which includes automated exposure control, adjustment of the mA and/or kV according to patient size and/or use of iterative reconstruction technique. CONTRAST:  75mL OMNIPAQUE  IOHEXOL  350 MG/ML SOLN COMPARISON:  12/02/2024 FINDINGS: Cardiovascular: Heart size upper normal. No substantial pericardial effusion. Left-sided permanent pacemaker/AICD evident. Coronary artery calcification is evident. Mild atherosclerotic calcification is noted in the wall of the thoracic aorta. Mediastinum/Nodes: Scattered small to upper normal mediastinal and right hilar lymph nodes evident. The esophagus has normal imaging features. There is no axillary lymphadenopathy. Lungs/Pleura: Centrilobular and paraseptal emphysema evident. 3 mm left upper lobe nodule on 30/4 is stable. Loculated right-sided pleural effusion is progressive since prior, now moderate to large in size. Areas of pleural enhancement are seen in the right hemithorax. Collapse/consolidation noted right lower lobe. Right pigtail pleural drain is identified with the distal loop apparently formed in the parenchyma of the right lower lobe and may be in a fissure. Gas in the right pleural fluid  is likely related to the drain placement although infection could produce this appearance. Upper Abdomen: Visualized portion of the upper abdomen shows no acute findings. Musculoskeletal: No worrisome lytic or sclerotic osseous abnormality. IMPRESSION: 1. Loculated right-sided pleural effusion is progressive since prior chest CT, now moderate to large in size. Areas of pleural enhancement are seen in the right hemithorax raising concern for infection or neoplasm. 2. Right pigtail pleural drain is identified with the distal loop apparently formed in the parenchyma of the right lower lobe and potentially in a fissure. If the pleural tube is not draining, repositioning may be warranted. 3. Collapse/consolidation right lower lobe. 4. Stable 3 mm left upper lobe pulmonary nodule. Most likely benign. Attention on follow-up recommended. 5. Aortic Atherosclerosis (ICD10-I70.0) and Emphysema (ICD10-J43.9). Electronically Signed   By: Camellia Candle M.D.   On: 12/09/2024 07:44   DG CHEST PORT 1 VIEW Result Date: 12/08/2024 CLINICAL DATA:  Pneumonia, loculated right pleural effusion, chest tube in place EXAM: PORTABLE CHEST 1 VIEW COMPARISON:  Prior chest x-ray yesterday at 7:14 a.m. FINDINGS: Interval reaccumulation of right-sided pleural effusion with fluid likely in trapped in the minor fissure. Percutaneous chest tube remains in unchanged position. Left subclavian approach cardiac rhythm maintenance device. Stable cardiomegaly. No pulmonary edema.  Mild atelectasis versus scarring in the lingula. No evidence of pneumothorax. IMPRESSION: 1. Likely increasing right pleural effusion versus worsening atelectasis. 2. Pigtail thoracostomy remains in unchanged position. Electronically Signed   By: Wilkie Lent M.D.   On: 12/08/2024 08:43   DG CHEST PORT 1 VIEW Result Date: 12/07/2024 EXAM: 1 VIEW(S) XRAY OF THE CHEST 12/07/2024 07:16:00 AM COMPARISON: Comparison 12/06/2024. CLINICAL HISTORY: [id number] Chest tube in place  [id number]; 142230 Pleural effusion 142230 Chest tube in place ([id number]). Pleural effusion (142230). FINDINGS: LINES, TUBES AND DEVICES: There is a left chest wall pacer, which is unchanged from the previous exam. Right basilar pigtail thoracostomy tube is unchanged in location. LUNGS AND PLEURA: The known loculated right pleural effusion has increased in volume compared with the prior exam. Subsegmental atelectasis noted in the left base. No pneumothorax. HEART AND MEDIASTINUM: No acute abnormality of the cardiac and mediastinal silhouettes. BONES AND SOFT TISSUES: No acute osseous abnormality. IMPRESSION: 1. Increased volume of the known loculated right pleural effusion compared with the prior exam. 2. Right basilar pigtail thoracostomy tube unchanged in location. Electronically signed by: Waddell Calk MD 12/07/2024 07:19 AM EST RP Workstation: [id number]    Labs:  CBC: Recent Labs    12/06/24 0712 12/07/24 0355 12/07/24 2218 12/09/24 0512  WBC 24.8* 29.7* 22.0* 15.3*  HGB 12.7 12.9 12.0 11.1*  HCT 40.4 39.9 37.1 34.4*  PLT 279 241 196 190    COAGS: Recent Labs    11/25/24 1848 12/09/24 1325  INR 1.2 1.1    BMP: Recent Labs    12/01/24 0037 12/02/24 0501 12/03/24 0703 12/08/24 2010 12/09/24 0512  NA 133* 137  --  132* 134*  K 4.3 4.3  --  3.6 3.8  CL 95* 101  --  96* 99  CO2 28 28  --  31 29  GLUCOSE 227* 103*  --  121* 89  BUN 22 16  --  14 11  CALCIUM  8.7* 8.7*  --  8.3* 8.8*  CREATININE 1.17* 0.89 0.99 0.98 0.74  GFRNONAA 53* >60 >60 >60 >60    LIVER FUNCTION TESTS: Recent Labs    07/03/24 1303 11/25/24 1848 11/27/24 0407  BILITOT 0.9 0.9 0.2  AST 21 13* 33  ALT 21 <5 20  ALKPHOS 88 103 111  PROT 7.1 7.2 5.8*  ALBUMIN 3.5 3.5 2.9*    Assessment and Plan:  Patient is a 64 yo female who is post Right Thoracostomy placement without any signs of infection.  Plan Continue to monitor thoracostomy output and keep to low suction.  If superior component does  not fully drain another pigtail may need to be placed.  Recommend obtaining repeat CXR to assess for resolution of pleural effusion.  Electronically Signed: Leanor JINNY Forget, PA-C 12/10/2024, 3:58 PM   I spent a total of 25 Minutes at the the patient's bedside AND on the patient's hospital floor or unit, greater than 50% of which was counseling/coordinating care for post right thoracostomy placement assessment and maintenance.      "

## 2024-12-11 ENCOUNTER — Other Ambulatory Visit (HOSPITAL_COMMUNITY): Payer: Self-pay | Admitting: Cardiology

## 2024-12-11 ENCOUNTER — Inpatient Hospital Stay (HOSPITAL_COMMUNITY)

## 2024-12-11 DIAGNOSIS — R042 Hemoptysis: Secondary | ICD-10-CM | POA: Diagnosis not present

## 2024-12-11 DIAGNOSIS — J90 Pleural effusion, not elsewhere classified: Secondary | ICD-10-CM | POA: Diagnosis not present

## 2024-12-11 DIAGNOSIS — J189 Pneumonia, unspecified organism: Secondary | ICD-10-CM | POA: Diagnosis not present

## 2024-12-11 LAB — CBC WITH DIFFERENTIAL/PLATELET
Abs Immature Granulocytes: 0.2 10*3/uL — ABNORMAL HIGH (ref 0.00–0.07)
Basophils Absolute: 0.1 10*3/uL (ref 0.0–0.1)
Basophils Relative: 1 %
Eosinophils Absolute: 0.4 10*3/uL (ref 0.0–0.5)
Eosinophils Relative: 3 %
HCT: 31.2 % — ABNORMAL LOW (ref 36.0–46.0)
Hemoglobin: 10 g/dL — ABNORMAL LOW (ref 12.0–15.0)
Immature Granulocytes: 2 %
Lymphocytes Relative: 12 %
Lymphs Abs: 1.7 10*3/uL (ref 0.7–4.0)
MCH: 27.5 pg (ref 26.0–34.0)
MCHC: 32.1 g/dL (ref 30.0–36.0)
MCV: 85.7 fL (ref 80.0–100.0)
Monocytes Absolute: 2.4 10*3/uL — ABNORMAL HIGH (ref 0.1–1.0)
Monocytes Relative: 18 %
Neutro Abs: 9 10*3/uL — ABNORMAL HIGH (ref 1.7–7.7)
Neutrophils Relative %: 64 %
Platelets: 196 10*3/uL (ref 150–400)
RBC: 3.64 MIL/uL — ABNORMAL LOW (ref 3.87–5.11)
RDW: 16.2 % — ABNORMAL HIGH (ref 11.5–15.5)
Smear Review: NORMAL
WBC: 13.7 10*3/uL — ABNORMAL HIGH (ref 4.0–10.5)
nRBC: 0.2 % (ref 0.0–0.2)

## 2024-12-11 LAB — BASIC METABOLIC PANEL WITH GFR
Anion gap: 8 (ref 5–15)
BUN: 11 mg/dL (ref 8–23)
CO2: 28 mmol/L (ref 22–32)
Calcium: 8.3 mg/dL — ABNORMAL LOW (ref 8.9–10.3)
Chloride: 99 mmol/L (ref 98–111)
Creatinine, Ser: 0.78 mg/dL (ref 0.44–1.00)
GFR, Estimated: >60 mL/min
Glucose, Bld: 101 mg/dL — ABNORMAL HIGH (ref 70–99)
Potassium: 3.5 mmol/L (ref 3.5–5.1)
Sodium: 134 mmol/L — ABNORMAL LOW (ref 135–145)

## 2024-12-11 LAB — MAGNESIUM: Magnesium: 1.5 mg/dL — ABNORMAL LOW (ref 1.7–2.4)

## 2024-12-11 MED ORDER — METRONIDAZOLE 500 MG PO TABS
500.0000 mg | ORAL_TABLET | Freq: Two times a day (BID) | ORAL | Status: AC
  Administered 2024-12-11 – 2024-12-12 (×3): 500 mg via ORAL
  Filled 2024-12-11 (×3): qty 1

## 2024-12-11 MED ORDER — MAGNESIUM OXIDE -MG SUPPLEMENT 400 (240 MG) MG PO TABS
400.0000 mg | ORAL_TABLET | Freq: Two times a day (BID) | ORAL | Status: AC
  Administered 2024-12-11 – 2024-12-12 (×4): 400 mg via ORAL
  Filled 2024-12-11 (×4): qty 1

## 2024-12-11 MED ORDER — MAGNESIUM SULFATE 2 GM/50ML IV SOLN
2.0000 g | Freq: Once | INTRAVENOUS | Status: AC
  Administered 2024-12-11: 2 g via INTRAVENOUS
  Filled 2024-12-11: qty 50

## 2024-12-11 NOTE — Plan of Care (Signed)
  Problem: Education: Goal: Knowledge of General Education information will improve Description: Including pain rating scale, medication(s)/side effects and non-pharmacologic comfort measures Outcome: Progressing   Problem: Clinical Measurements: Goal: Ability to maintain clinical measurements within normal limits will improve Outcome: Progressing   Problem: Activity: Goal: Risk for activity intolerance will decrease Outcome: Progressing   Problem: Nutrition: Goal: Adequate nutrition will be maintained Outcome: Progressing   Problem: Elimination: Goal: Will not experience complications related to bowel motility Outcome: Progressing   Problem: Pain Managment: Goal: General experience of comfort will improve and/or be controlled Outcome: Progressing   Problem: Safety: Goal: Ability to remain free from injury will improve Outcome: Progressing   Problem: Skin Integrity: Goal: Risk for impaired skin integrity will decrease Outcome: Progressing

## 2024-12-11 NOTE — Progress Notes (Signed)
 " PROGRESS NOTE    Whitney May  FMW:[id number] DOB: [birth year] DOA: 11/25/2024 PCP: Alston Silvio BROCKS, FNP    Brief Narrative:  64 year old with history of chronic left bundle branch block, chronic diastolic dysfunction, coronary artery disease status post PCI and stenting on aspirin  and Brilinta , Ranexa , ischemic cardiomyopathy status post ICD, history of microperforation due to ICD, hyperlipidemia, hypothyroidism and ongoing smoker presented with 2 days of shortness of breath, cough and pleuritic chest pain.  In the emergency room afebrile.  Hemodynamically stable.  92% on 2 L oxygen .  WBC count 32.9.  CT angiogram of the chest was negative for PE but showed moderate right pleural effusion that was slightly loculated.  Patient was started on antibiotics.  Admitted with thoracentesis, pulmonary following. 1/30, chest tube placed with inadequate drainage. 2/3, chest tube reinserted and draining well.  Clinically improving.  Subjective:  Patient seen and examined.  Has some discomfort on the posterior right lung otherwise no other complaints.  She is on 1 to 2 L of oxygen  and saturating 100%.  Can wean off. Afebrile. Chest tube about 600 mL since last 24 hours. Repeat chest x-ray done, shows overall improvement but he still may have some loculated effusion. W B C count 13,000. No more hemoptysis.   Assessment & Plan:   Sepsis present on admission secondary to community-acquired pneumonia with loculated right pleural effusion  Empyema ruled out.   Presented with tachycardia, leukocytosis and tachypnea. Blood cultures negative.  Lactic acid normal. Procalcitonin less than 0.1. Initially on Rocephin  and azithromycin , then on Rocephin  and doxycycline .  Now on Rocephin , doxycycline  and Flagyl .  Multiple fluid cultures negative. Will discuss with pulmonary, she is already on 14 days of Rocephin .  Can treat with broad-spectrum oral antibiotics for another 1 week by mouth. WBC was trending up,  aspirin  Brilinta  was held and patient underwent chest tube placement. 1/30, chest tube placed by IR.  Minimal drainage.  No cultures growth so far. CT scan 2/3, large right-sided pleural effusion and chest tube at the lung parenchyma. 2/3, new chest tube placement into the pleural effusion-clinically improving-drained 500 cc. continue chest physiotherapy, incentive spirometry, deep breathing exercises, sputum induction, mucolytic's and bronchodilators. Supplemental oxygen  to keep saturations more than 90%.  Mobilize in the hallway. Pulmonary following. Hemoptysis due to coagulopathy from aspirin , Brilinta , tPA and Lovenox .  Improved.  DAPT on hold. Seen by CT surgery, do not recommend VATS. Anticipate conservative management.  Continue chest tube.  May need repeat CT scan to look for other areas of loculation.  Chronic systolic heart failure due to ischemic cardiomyopathy,  Coronary artery disease status post stent on aspirin  and Brilinta  Status post ICD Chronic left bundle branch block  Cardiovascular she has remained stable. Off DAPT for procedure since 1/24.  Back on DAPT-caused hemoptysis-paused since 1/31. Patient continued on statin, Ranexa , stable.  Hypothyroidism: On Synthroid .  Continue.  Smoker: Counseled to quit.  On nicotine  patch.     DVT prophylaxis: SCDs.   Code Status: Full code Family Communication: None at the bedside.   Disposition Plan: Status is: Inpatient Remains inpatient appropriate because: IV antibiotics, inpatient procedures     Consultants:  Pulmonary IR CT CS  Procedures:  Diagnostic pleural tap at the bedside Chest tube placement  Antimicrobials:  Rocephin  1/21--- Doxycycline  1/21--- 1/28 Flagyl  1/31---     Objective: Vitals:   12/11/24 0430 12/11/24 0446 12/11/24 0830 12/11/24 0842  BP:  (!) 107/54 99/61   Pulse:  72 80  Resp:  17 18   Temp:  97.8 F (36.6 C)    TempSrc:      SpO2:  99% 92% 93%  Weight: 67.7 kg      Height:        Intake/Output Summary (Last 24 hours) at 12/11/2024 1032 Last data filed at 12/11/2024 0424 Gross per 24 hour  Intake 20 ml  Output 230 ml  Net -210 ml    Filed Weights   12/09/24 0500 12/10/24 0419 12/11/24 0430  Weight: 70.1 kg 69.7 kg 67.7 kg    Examination:  General exam: Pleasant and interactive.  Fairly comfortable on room air. Respiratory system: Mostly bilaterally clear.  No added sounds.  Good air entry bilateral.  Chest tube right posterior lung base with some thin serosanguineous drainage. Cardiovascular system: S1 & S2 heard, RRR.  AICD present left precordium.  No pedal edema. Gastrointestinal system: Abdomen is nondistended, soft and nontender. No organomegaly or masses felt. Normal bowel sounds heard.    Data Reviewed: I have personally reviewed following labs and imaging studies  CBC: Recent Labs  Lab 12/05/24 0318 12/06/24 0712 12/07/24 0355 12/07/24 2218 12/09/24 0512 12/11/24 0115  WBC 19.9* 24.8* 29.7* 22.0* 15.3* 13.7*  NEUTROABS 11.7* 17.4* 24.7*  --  10.5* 9.0*  HGB 11.6* 12.7 12.9 12.0 11.1* 10.0*  HCT 36.4 40.4 39.9 37.1 34.4* 31.2*  MCV 85.8 85.8 84.9 84.5 85.1 85.7  PLT 318 279 241 196 190 196   Basic Metabolic Panel: Recent Labs  Lab 12/08/24 2010 12/09/24 0512 12/11/24 0115  NA 132* 134* 134*  K 3.6 3.8 3.5  CL 96* 99 99  CO2 31 29 28   GLUCOSE 121* 89 101*  BUN 14 11 11   CREATININE 0.98 0.74 0.78  CALCIUM  8.3* 8.8* 8.3*  MG  --  2.0 1.5*   GFR: Estimated Creatinine Clearance: 66.6 mL/min (by C-G formula based on SCr of 0.78 mg/dL). Liver Function Tests: No results for input(s): AST, ALT, ALKPHOS, BILITOT, PROT, ALBUMIN in the last 168 hours.  No results for input(s): LIPASE, AMYLASE in the last 168 hours. No results for input(s): AMMONIA in the last 168 hours. Coagulation Profile: Recent Labs  Lab 12/09/24 1325  INR 1.1   Cardiac Enzymes: No results for input(s): CKTOTAL, CKMB,  CKMBINDEX, TROPONINI in the last 168 hours. BNP (last 3 results) No results for input(s): PROBNP in the last 8760 hours. HbA1C: No results for input(s): HGBA1C in the last 72 hours. CBG: No results for input(s): GLUCAP in the last 168 hours. Lipid Profile: No results for input(s): CHOL, HDL, LDLCALC, TRIG, CHOLHDL, LDLDIRECT in the last 72 hours. Thyroid  Function Tests: No results for input(s): TSH, T4TOTAL, FREET4, T3FREE, THYROIDAB in the last 72 hours. Anemia Panel: No results for input(s): VITAMINB12, FOLATE, FERRITIN, TIBC, IRON, RETICCTPCT in the last 72 hours. Sepsis Labs: Recent Labs  Lab 12/07/24 2218  LATICACIDVEN 0.8    Recent Results (from the past 240 hours)  Body fluid culture w Gram Stain     Status: None   Collection Time: 12/02/24  2:01 PM   Specimen: Path fluid; Body Fluid  Result Value Ref Range Status   Specimen Description FLUID  Final   Special Requests LUNG,RIGHT LOWER LOBE  Final   Gram Stain NO WBC SEEN NO ORGANISMS SEEN   Final   Culture   Final    NO GROWTH 3 DAYS Performed at [facility], [address], [place], [place] [address]    Report  Status 12/05/2024 FINAL  Final         Radiology Studies: DG CHEST PORT 1 VIEW Result Date: 12/11/2024 CLINICAL DATA:  Right chest tube. EXAM: PORTABLE CHEST 1 VIEW COMPARISON:  12/10/2024 FINDINGS: The cardio pericardial silhouette is enlarged. Right chest tube remains in place. No pneumothorax. No substantial change in loculated right pleural fluid collection. Streaky opacity at the left base compatible with atelectasis. Left permanent pacemaker/AICD again noted. IMPRESSION: No substantial change in loculated right pleural fluid collection with right chest tube in place. No pneumothorax. Electronically Signed   By: Camellia Candle M.D.   On: 12/11/2024 07:34   DG CHEST PORT 1 VIEW Result Date: 12/10/2024 CLINICAL DATA:  Right pleural effusion. EXAM: PORTABLE  CHEST 1 VIEW COMPARISON:  Radiograph and CT 12/08/2024 FINDINGS: Pigtail catheter is coiled laterally in the lower hemithorax. Diminished right pleural effusion. Persistent pleural fluid tracks laterally, at the lung base, and is loculated in the major fissure. No demonstrated pneumothorax. Multi lead left-sided pacemaker in place. The left lung is clear. Mild cardiomegaly is stable. No pulmonary edema. IMPRESSION: Pigtail catheter coiled laterally in the lower right hemithorax. Diminished right pleural effusion. Persistent pleural fluid tracks laterally, at the lung base, and is loculated in the major fissure. Electronically Signed   By: Andrea Gasman M.D.   On: 12/10/2024 12:00   CT PERC PLEURAL DRAIN W/INDWELL CATH W/IMG GUIDE Result Date: 12/10/2024 INDICATION: 64 year old female with enlarging right pleural effusion status post ultrasound-guided right thoracostomy tube placement on 12/05/2024. EXAM: CT PERC PLEURAL DRAIN W/INDWELL CATH W/IMG GUIDE COMPARISON:  12/08/2024 MEDICATIONS: The patient is currently admitted to the hospital and receiving intravenous antibiotics. The antibiotics were administered within an appropriate time frame prior to the initiation of the procedure. ANESTHESIA/SEDATION: Moderate (conscious) sedation was employed during this procedure. A total of Versed  0 mg and Fentanyl  100 mcg was administered intravenously. Moderate Sedation Time: 30 minutes. The patient's level of consciousness and vital signs were monitored continuously by radiology nursing throughout the procedure under my direct supervision. CONTRAST:  None COMPLICATIONS: None immediate. PROCEDURE: RADIATION DOSE REDUCTION: This exam was performed according to the departmental dose-optimization program which includes automated exposure control, adjustment of the mA and/or kV according to patient size and/or use of iterative reconstruction technique. Informed written consent was obtained from the patient after a discussion  of the risks, benefits and alternatives to treatment. The patient was placed left lateral decubitus on the CT gantry and a pre procedural CT was performed re-demonstrating the known fluid collection within the right pleural space. The procedure was planned. A timeout was performed prior to the initiation of the procedure. The right posterolateral and inferior thorax was prepped and draped in the usual sterile fashion. The overlying soft tissues were anesthetized with 1% lidocaine  with epinephrine . Appropriate trajectory was planned with the use of a 22 gauge spinal needle. An 18 gauge trocar needle was advanced into the pleural space and a short Amplatz super stiff wire was coiled within the collection. Appropriate positioning was confirmed with a limited CT scan. The tract was serially dilated allowing placement of a 14 French all-purpose drainage catheter. Appropriate positioning was confirmed with a limited postprocedural CT scan. A total of approximately 400 ml of sanguinous fluid was aspirated. The malpositioned, indwelling thoracostomy tube was then removed without complication. Postprocedure limited chest CT demonstrated decreased volume of pleural effusion without evidence of pneumothorax. The tube was connected to a pleurovac and sutured in place. A dressing was placed. The patient  tolerated the procedure well without immediate post procedural complication. IMPRESSION: Successful CT guided placement of a 7 French all purpose drain catheter into the posterolateral and inferior right pleural space with aspiration of 400 cc of sanguinous fluid. The previously placed indwelling thoracostomy tube was removed without complication. Ester Sides, MD [facility] Radiology Electronically Signed   By: Ester Sides M.D.   On: 12/10/2024 09:16         Scheduled Meds:  alteplase  (CATHFLO ACTIVASE ) 5 mg in sodium chloride  (PF) 0.9 % 25 mL  5 mg Intrapleural Once    And   dornase alfa  (PULMOZYME ) 5 mg in sterile water  (preservative free) 30 mL  5 mg Intrapleural Once   calcium  carbonate  1 tablet Oral Daily   doxycycline   100 mg Oral Q12H   fluticasone  furoate-vilanterol  1 puff Inhalation Daily   guaiFENesin   600 mg Oral BID   levothyroxine   100 mcg Oral Q0600   lidocaine  1 %  10 mL Other Once   melatonin  6 mg Oral QHS   midodrine   5 mg Oral TID WC   nicotine   21 mg Transdermal Daily   ranolazine   500 mg Oral BID   rosuvastatin   40 mg Oral Daily   sodium chloride  flush  10 mL Intrapleural Q8H   Continuous Infusions:  cefTRIAXone  (ROCEPHIN )  IV 2 g (12/10/24 1005)   magnesium  sulfate bolus IVPB 2 g (12/11/24 1027)   metronidazole  500 mg (12/11/24 0424)     LOS: 16 days       Renato Applebaum, MD [facility]   "

## 2024-12-11 NOTE — Progress Notes (Signed)
" °   °  [address] [place] [address]             [phone]       CT chest ordered Pt currently on room air  Whitney May O Whitney May   "

## 2024-12-11 NOTE — Progress Notes (Signed)
 "  NAME:  Whitney May, MRN:  [id number], DOB:  10-11-1961, LOS: 16 ADMISSION DATE:  11/25/2024, CONSULTATION DATE:  11/29/24 REFERRING MD:  Tat, CHIEF COMPLAINT:  abnormal chest imaging    History of Present Illness:  64 yo F  PMH tobacco use, CAD with prior PCI on DAPT (ASA brilinta ), HFrEF, ICM, s/p ICD, who was admitted to TRH at [facility] after presenting 1/21 w SOB/cough. Started tx for PNA and underwent thora 1/21 with IR for R pleural effusion.  Fluid was loculated and incompletely drained so transfer was requested to [facility] for chest tube placement.   Pt arrived days 1/24    Pertinent  Medical History  CAD ICM HFrEF S/p ICD   Significant Hospital Events: Including procedures, antibiotic start and stop dates in addition to other pertinent events   1/20 admit to [facility]  1/21 thora w IR 1/22 txf request to [facility] for chest tube 1/24 arrives to [facility].  1/27: Failed attempt at chest tube placement.  Removal of only 5 cc of fluid. CT chest 1. Small loculated right pleural effusion, decreased compared to the prior CT. Few small gas locules within the right posterior pleural collection. Partial consolidation in the right lower lobe, probable atelectasis. Decreased mediastinal adenopathy compared to prior. 1/30 chest tube placement by IR 1/31 initial dose of pleural lytic therapy (reduced alteplase  dose) 2/1 increase in pleural effusion, hemoptysis developed after lytics 2/2 CT Surgery Consulted, repeat CT Chest with contrast performed  Interim History / Subjective:   of chest tube drainage in past 24 hours She is not feeling well today, continues to have right sided chest discomfort   Objective    Blood pressure (!) 107/54, pulse 72, temperature 97.8 F (36.6 C), resp. rate 17, height 5' 2 (1.575 m), weight 67.7 kg, SpO2 99%.        Intake/Output Summary (Last 24 hours) at 12/11/2024 0752 Last data filed at 12/11/2024 0424 Gross per 24 hour  Intake 20 ml  Output 260 ml  Net -240  ml    Filed Weights   12/09/24 0500 12/10/24 0419 12/11/24 0430  Weight: 70.1 kg 69.7 kg 67.7 kg    Examination: General: no distress, resting in bed Lungs: Lungs diminished right base. Chest tube in place, serosanguinous drainage Heart: regular rate rhythm, no murmur appreciated Abdomen: non tender, non distended. Normal BS.  Neuro: axox 3.  All extremities. Ext: no edema  Resolved problem list   Assessment and Plan   Right loculated effusion Bacterial pneumonia Hemoptysis  - Exudative right effusion s/p chest tube placement 2/3 and removal of old chest tube - She continues to have apical appearing fluid collection and basilar collection on right. May require a second chest tube for drainage vs VATS  - Case discussed with CT Surgery and IR. Awaiting CT Surgery review, repeat CT ordered. - hold further pleural lytic therapy - hold brillinta, prophylactic lovenox  and aspirin  81mg  - doxycycline  and flagyl  for antibiotic coverage. Completed course of ceftriaxone  - flush chest tube every 8-12 hours - Toxoplasma PCR pending.  Strongyloidiasis negative.  Pulmonary will follow.  Labs   CBC: Recent Labs  Lab 12/05/24 0318 12/06/24 0712 12/07/24 0355 12/07/24 2218 12/09/24 0512 12/11/24 0115  WBC 19.9* 24.8* 29.7* 22.0* 15.3* 13.7*  NEUTROABS 11.7* 17.4* 24.7*  --  10.5* 9.0*  HGB 11.6* 12.7 12.9 12.0 11.1* 10.0*  HCT 36.4 40.4 39.9 37.1 34.4* 31.2*  MCV 85.8 85.8 84.9 84.5 85.1 85.7  PLT 318 279  241 196 190 196    Basic Metabolic Panel: Recent Labs  Lab 12/08/24 2010 12/09/24 0512 12/11/24 0115  NA 132* 134* 134*  K 3.6 3.8 3.5  CL 96* 99 99  CO2 31 29 28   GLUCOSE 121* 89 101*  BUN 14 11 11   CREATININE 0.98 0.74 0.78  CALCIUM  8.3* 8.8* 8.3*  MG  --  2.0 1.5*   GFR: Estimated Creatinine Clearance: 66.6 mL/min (by C-G formula based on SCr of 0.78 mg/dL). Recent Labs  Lab 12/07/24 0355 12/07/24 2218 12/09/24 0512 12/11/24 0115  WBC 29.7* 22.0* 15.3*  13.7*  LATICACIDVEN  --  0.8  --   --     Liver Function Tests: No results for input(s): AST, ALT, ALKPHOS, BILITOT, PROT, ALBUMIN in the last 168 hours.  No results for input(s): LIPASE, AMYLASE in the last 168 hours. No results for input(s): AMMONIA in the last 168 hours.  ABG    Component Value Date/Time   TCO2 24 09/18/2022 1507     Coagulation Profile: Recent Labs  Lab 12/09/24 1325  INR 1.1     Cardiac Enzymes: No results for input(s): CKTOTAL, CKMB, CKMBINDEX, TROPONINI in the last 168 hours.  HbA1C: Hgb A1c MFr Bld  Date/Time Value Ref Range Status  09/18/2022 03:13 PM 5.3 4.8 - 5.6 % Final    Comment:    (NOTE) Pre diabetes:          5.7%-6.4%  Diabetes:              >6.4%  Glycemic control for   <7.0% adults with diabetes   08/02/2018 05:33 PM 5.5 4.8 - 5.6 % Final    Comment:    (NOTE) Pre diabetes:          5.7%-6.4% Diabetes:              >6.4% Glycemic control for   <7.0% adults with diabetes     CBG: No results for input(s): GLUCAP in the last 168 hours.   Dorn Chill, MD [facility] Office: [phone]   See Amion for personal pager PCCM on call pager [phone] until 7pm. Please call Elink 7p-7a. [phone]           "

## 2024-12-11 NOTE — Progress Notes (Signed)
 25 cc's drained over night shift into chest tube

## 2024-12-12 NOTE — Progress Notes (Addendum)
 "  NAME:  Whitney May, MRN:  [id number], DOB:  08/27/1961, LOS: 17 ADMISSION DATE:  11/25/2024, CONSULTATION DATE:  11/29/24 REFERRING MD:  Tat, CHIEF COMPLAINT:  abnormal chest imaging    History of Present Illness:  64 yo F  PMH tobacco use, CAD with prior PCI on DAPT (ASA brilinta ), HFrEF, ICM, s/p ICD, who was admitted to TRH at [facility] after presenting 1/21 w SOB/cough. Started tx for PNA and underwent thora 1/21 with IR for R pleural effusion.  Fluid was loculated and incompletely drained so transfer was requested to [facility] campus for chest tube placement.   Pt arrived days 1/24    Pertinent  Medical History  CAD ICM HFrEF S/p ICD   Significant Hospital Events: Including procedures, antibiotic start and stop dates in addition to other pertinent events   1/20 admit to [facility]  1/21 thora w IR 1/22 txf request to [facility] for chest tube 1/24 arrives to [facility].  1/27: Failed attempt at chest tube placement.  Removal of only 5 cc of fluid. CT chest 1. Small loculated right pleural effusion, decreased compared to the prior CT. Few small gas locules within the right posterior pleural collection. Partial consolidation in the right lower lobe, probable atelectasis. Decreased mediastinal adenopathy compared to prior. 1/30 chest tube placement by IR 1/31 initial dose of pleural lytic therapy (reduced alteplase  dose) 2/1 increase in pleural effusion, hemoptysis developed after lytics 2/2 CT Surgery Consulted, repeat CT Chest with contrast performed  Interim History / Subjective:   of chest tube drainage in past 24 hours She is not feeling well today, continues to have right sided chest discomfort   Objective    Blood pressure (!) 94/49, pulse 77, temperature 98.8 F (37.1 C), resp. rate 18, height 5' 2 (1.575 m), weight 67.5 kg, SpO2 91%.        Intake/Output Summary (Last 24 hours) at 12/12/2024 1733 Last data filed at 12/12/2024 1300 Gross per 24 hour  Intake 1450 ml  Output 70 ml  Net 1380  ml    Filed Weights   12/10/24 0419 12/11/24 0430 12/12/24 0500  Weight: 69.7 kg 67.7 kg 67.5 kg   Physical Exam: General: Elderly-appearing, no acute distress HENT: [place], AT, OP clear, MMM Eyes: EOMI, no scleral icterus Respiratory: Right chest tube in place. Diminished to auscultation bilaterally.  No crackles, wheezing or rales Cardiovascular: RRR, -M/R/G, no JVD Extremities:-Edema,-tenderness Neuro: AAO x4, CNII-XII grossly intact Psych: Normal mood, normal affect  CT Chest 2/5 Decreased size of right loculated pleura effusion. Right pigtail chest tube in place. Unchanged RML and RLL consolidation. Emphysema  WBC 13.7 improving  Resolved problem list   Assessment and Plan   Right loculated effusion - empyema ruled out with neg cultures  S/p chest tube 1/30. S/p chest tube removal and replaced on 2/3 Bacterial pneumonia Hemoptysis on DAPT  CT Chest 2/5 with improved but persistent right pleural effusion with loculation S/p ceftriaxone  since 1/20-2/5. - TCTS planning for VATS on 2/9 - hold brillinta, prophylactic lovenox  and aspirin  81mg  - doxycycline  and flagyl  for antibiotic coverage - Chest tube remains on suction -20 cm H20 - flush chest tube every 8-12 hours - Strongyloidiasis negative.  Pulmonary will intermittently follow. See again 2/8  Labs   CBC: Recent Labs  Lab 12/06/24 0712 12/07/24 0355 12/07/24 2218 12/09/24 0512 12/11/24 0115  WBC 24.8* 29.7* 22.0* 15.3* 13.7*  NEUTROABS 17.4* 24.7*  --  10.5* 9.0*  HGB 12.7 12.9 12.0 11.1* 10.0*  HCT 40.4 39.9 37.1 34.4* 31.2*  MCV 85.8 84.9 84.5 85.1 85.7  PLT 279 241 196 190 196    Basic Metabolic Panel: Recent Labs  Lab 12/08/24 2010 12/09/24 0512 12/11/24 0115  NA 132* 134* 134*  K 3.6 3.8 3.5  CL 96* 99 99  CO2 31 29 28   GLUCOSE 121* 89 101*  BUN 14 11 11   CREATININE 0.98 0.74 0.78  CALCIUM  8.3* 8.8* 8.3*  MG  --  2.0 1.5*   GFR: Estimated Creatinine Clearance: 66.6 mL/min (by C-G  formula based on SCr of 0.78 mg/dL). Recent Labs  Lab 12/07/24 0355 12/07/24 2218 12/09/24 0512 12/11/24 0115  WBC 29.7* 22.0* 15.3* 13.7*  LATICACIDVEN  --  0.8  --   --     Liver Function Tests: No results for input(s): AST, ALT, ALKPHOS, BILITOT, PROT, ALBUMIN in the last 168 hours.  No results for input(s): LIPASE, AMYLASE in the last 168 hours. No results for input(s): AMMONIA in the last 168 hours.  ABG    Component Value Date/Time   TCO2 24 09/18/2022 1507     Coagulation Profile: Recent Labs  Lab 12/09/24 1325  INR 1.1     Cardiac Enzymes: No results for input(s): CKTOTAL, CKMB, CKMBINDEX, TROPONINI in the last 168 hours.  HbA1C: Hgb A1c MFr Bld  Date/Time Value Ref Range Status  09/18/2022 03:13 PM 5.3 4.8 - 5.6 % Final    Comment:    (NOTE) Pre diabetes:          5.7%-6.4%  Diabetes:              >6.4%  Glycemic control for   <7.0% adults with diabetes   08/02/2018 05:33 PM 5.5 4.8 - 5.6 % Final    Comment:    (NOTE) Pre diabetes:          5.7%-6.4% Diabetes:              >6.4% Glycemic control for   <7.0% adults with diabetes     CBG: No results for input(s): GLUCAP in the last 168 hours.  Care Time: 2 min  Slater Staff, M.D. [facility]monary/Critical Care Medicine 12/12/2024 5:33 PM   See Amion for personal pager For hours between 7 PM to 7 AM, please call Elink for urgent questions         "

## 2024-12-12 NOTE — Progress Notes (Signed)
" °   °  [address] [place] [address]             [phone]       Images reviewed Will plan for decortication on 2/9  Linnie KIDD Abigayl Hor   "

## 2024-12-12 NOTE — Progress Notes (Signed)
 " PROGRESS NOTE    Whitney May  FMW:[id number] DOB: 1961-10-25 DOA: 11/25/2024 PCP: Alston Silvio BROCKS, FNP    Brief Narrative:  64 year old with history of chronic left bundle branch block, chronic diastolic dysfunction, coronary artery disease status post PCI and stenting on aspirin  and Brilinta , Ranexa , ischemic cardiomyopathy status post ICD, history of microperforation due to ICD, hyperlipidemia, hypothyroidism and ongoing smoker presented with 2 days of shortness of breath, cough and pleuritic chest pain.  In the emergency room afebrile.  Hemodynamically stable.  92% on 2 L oxygen .  WBC count 32.9.  CT angiogram of the chest was negative for PE but showed moderate right pleural effusion that was slightly loculated.  Patient was started on antibiotics.  Admitted with thoracentesis, pulmonary following. 1/30, chest tube placed with inadequate drainage. 2/3, chest tube reinserted and draining well.  Clinically improving. 2/5, repeat CT scan consistent with pockets of fluid not drained with chest tube.  CT CS planning for VATS on 2/9.  Subjective:  Patient seen and examined.  Discomfort on the right chest wall, chest tube insertion site.  Afebrile.  Chest tube drained about 100 mL. Discussed with patient with Dr. Shyrl at the bedside.  New CT scan showed some of the fluid pockets intrafissural not drained.  For VATS on Monday.  No more hemoptysis.   Assessment & Plan:   Sepsis present on admission secondary to community-acquired pneumonia with loculated right pleural effusion  Empyema ruled out.   Presented with tachycardia, leukocytosis and tachypnea. Blood cultures negative.  Lactic acid normal. Procalcitonin less than 0.1. Initially on Rocephin  and azithromycin , then on Rocephin  and doxycycline .  Now on Rocephin , doxycycline  and Flagyl .  Multiple fluid cultures negative. Will discuss with pulmonary, she is already on 14 days of Rocephin .  Can treat with broad-spectrum oral  antibiotics for another 1 week by mouth.  Will continue doxycycline  and Flagyl . WBC was trending up, aspirin  Brilinta  was held and patient underwent chest tube placement. 1/30, chest tube placed by IR.  Minimal drainage.  No cultures growth so far. CT scan 2/3, large right-sided pleural effusion and chest tube at the lung parenchyma. 2/3, new chest tube placement into the pleural effusion-clinically improving-drained 500 cc. continue chest physiotherapy, incentive spirometry, deep breathing exercises, sputum induction, mucolytic's and bronchodilators. Supplemental oxygen  to keep saturations more than 90%.  Mobilize in the hallway. Pulmonary following. Hemoptysis due to coagulopathy from aspirin , Brilinta , tPA and Lovenox .  Improved.  DAPT on hold. 2/6, decided to do VATS.  Scheduled for Monday.  Chronic systolic heart failure due to ischemic cardiomyopathy,  Coronary artery disease status post stent on aspirin  and Brilinta  Status post ICD Chronic left bundle branch block  Cardiovascular she has remained stable. Off DAPT for procedure since 1/24.  Back on DAPT-caused hemoptysis-paused since 1/31. Patient continued on statin, Ranexa , stable.  Hypothyroidism: On Synthroid .  Continue.  Smoker: Counseled to quit.  On nicotine  patch.     DVT prophylaxis: SCDs.   Code Status: Full code Family Communication: None at the bedside.   Disposition Plan: Status is: Inpatient Remains inpatient appropriate because: IV antibiotics, inpatient procedures     Consultants:  Pulmonary IR CT CS  Procedures:  Diagnostic pleural tap at the bedside Chest tube placement  Antimicrobials:  Rocephin  1/21--- Doxycycline  1/21--- 1/28 Flagyl  1/31---     Objective: Vitals:   12/12/24 0500 12/12/24 0840 12/12/24 0902 12/12/24 0941  BP:  (!) 93/52    Pulse:  76 76   Resp:  18  18   Temp:      TempSrc:      SpO2:  94% 94% 92%  Weight: 67.5 kg     Height:        Intake/Output Summary (Last  24 hours) at 12/12/2024 1051 Last data filed at 12/12/2024 0500 Gross per 24 hour  Intake 1450 ml  Output 110 ml  Net 1340 ml    Filed Weights   12/10/24 0419 12/11/24 0430 12/12/24 0500  Weight: 69.7 kg 67.7 kg 67.5 kg    Examination:  General exam: Pleasant and interactive.  Fairly comfortable on room air. Respiratory system: Mostly bilaterally clear.  No added sounds.  Good air entry bilateral.  Chest tube right posterior lung base with some thin serosanguineous drainage. Cardiovascular system: S1 & S2 heard, RRR.  AICD present left precordium.  No pedal edema. Gastrointestinal system: Abdomen is nondistended, soft and nontender. No organomegaly or masses felt. Normal bowel sounds heard.    Data Reviewed: I have personally reviewed following labs and imaging studies  CBC: Recent Labs  Lab 12/06/24 0712 12/07/24 0355 12/07/24 2218 12/09/24 0512 12/11/24 0115  WBC 24.8* 29.7* 22.0* 15.3* 13.7*  NEUTROABS 17.4* 24.7*  --  10.5* 9.0*  HGB 12.7 12.9 12.0 11.1* 10.0*  HCT 40.4 39.9 37.1 34.4* 31.2*  MCV 85.8 84.9 84.5 85.1 85.7  PLT 279 241 196 190 196   Basic Metabolic Panel: Recent Labs  Lab 12/08/24 2010 12/09/24 0512 12/11/24 0115  NA 132* 134* 134*  K 3.6 3.8 3.5  CL 96* 99 99  CO2 31 29 28   GLUCOSE 121* 89 101*  BUN 14 11 11   CREATININE 0.98 0.74 0.78  CALCIUM  8.3* 8.8* 8.3*  MG  --  2.0 1.5*   GFR: Estimated Creatinine Clearance: 66.6 mL/min (by C-G formula based on SCr of 0.78 mg/dL). Liver Function Tests: No results for input(s): AST, ALT, ALKPHOS, BILITOT, PROT, ALBUMIN in the last 168 hours.  No results for input(s): LIPASE, AMYLASE in the last 168 hours. No results for input(s): AMMONIA in the last 168 hours. Coagulation Profile: Recent Labs  Lab 12/09/24 1325  INR 1.1   Cardiac Enzymes: No results for input(s): CKTOTAL, CKMB, CKMBINDEX, TROPONINI in the last 168 hours. BNP (last 3 results) No results for input(s):  PROBNP in the last 8760 hours. HbA1C: No results for input(s): HGBA1C in the last 72 hours. CBG: No results for input(s): GLUCAP in the last 168 hours. Lipid Profile: No results for input(s): CHOL, HDL, LDLCALC, TRIG, CHOLHDL, LDLDIRECT in the last 72 hours. Thyroid  Function Tests: No results for input(s): TSH, T4TOTAL, FREET4, T3FREE, THYROIDAB in the last 72 hours. Anemia Panel: No results for input(s): VITAMINB12, FOLATE, FERRITIN, TIBC, IRON, RETICCTPCT in the last 72 hours. Sepsis Labs: Recent Labs  Lab 12/07/24 2218  LATICACIDVEN 0.8    Recent Results (from the past 240 hours)  Body fluid culture w Gram Stain     Status: None   Collection Time: 12/02/24  2:01 PM   Specimen: Path fluid; Body Fluid  Result Value Ref Range Status   Specimen Description FLUID  Final   Special Requests LUNG,RIGHT LOWER LOBE  Final   Gram Stain NO WBC SEEN NO ORGANISMS SEEN   Final   Culture   Final    NO GROWTH 3 DAYS Performed at [facility], [address] [place], [place] [address]    Report Status 12/05/2024 FINAL  Final         Radiology  Studies: CT CHEST WO CONTRAST Result Date: 12/11/2024 EXAM: CT CHEST WITHOUT CONTRAST 12/11/2024 04:19:47 PM TECHNIQUE: CT of the chest was performed without the administration of intravenous contrast. Multiplanar reformatted images are provided for review. Automated exposure control, iterative reconstruction, and/or weight based adjustment of the mA/kV was utilized to reduce the radiation dose to as low as reasonably achievable. COMPARISON: X-ray early or today and CT chest 12/08/2024. CLINICAL HISTORY: Empyema. Empyema. FINDINGS: MEDIASTINUM: Heart and pericardium are unremarkable. Coronary artery and aortic atherosclerotic calcifications. The central airways are clear. LYMPH NODES: Cardiomediastinal lymph nodes are hypereactive and similar to prior. No axillary lymphadenopathy. LUNGS AND PLEURA: Right  pigtail pleural catheter has been readjusted and is now in the inferolateral right pleural space. Decrease in size of the left-sided pleural effusion in the area of the catheter. The loculated components anteriorly and posterior/superior to the catheter are unchanged . Similar right lower and middle lobe consolidation with low density edema compatible with pneumonia. Emphysema. No pneumothorax. SOFT TISSUES/BONES: Chest wall pacemaker. No acute abnormality of the bones or soft tissues. UPPER ABDOMEN: Limited images of the upper abdomen demonstrates no acute abnormality. IMPRESSION: 1. Overall decrease in size of the right loculated pleural effusion after readjustment of the pigtail pleural catheter. 2. Unchanged large loculated components anteriorly and posterior/superior to the catheter. 3. Similar right lower and middle lobe consolidation with low density edema compatible with pneumonia. 4. Emphysema. Pulmonary emphysema is an independent risk factor for lung cancer. Recommend consideration for evaluation for a low-dose CT lung cancer screening program. Electronically signed by: Norman Gatlin MD 12/11/2024 07:06 PM EST RP Workstation: [id number]   DG CHEST PORT 1 VIEW Result Date: 12/11/2024 CLINICAL DATA:  Right chest tube. EXAM: PORTABLE CHEST 1 VIEW COMPARISON:  12/10/2024 FINDINGS: The cardio pericardial silhouette is enlarged. Right chest tube remains in place. No pneumothorax. No substantial change in loculated right pleural fluid collection. Streaky opacity at the left base compatible with atelectasis. Left permanent pacemaker/AICD again noted. IMPRESSION: No substantial change in loculated right pleural fluid collection with right chest tube in place. No pneumothorax. Electronically Signed   By: Camellia Candle M.D.   On: 12/11/2024 07:34         Scheduled Meds:  alteplase  (CATHFLO ACTIVASE ) 5 mg in sodium chloride  (PF) 0.9 % 25 mL  5 mg Intrapleural Once   And   dornase alfa  (PULMOZYME ) 5 mg  in sterile water  (preservative free) 30 mL  5 mg Intrapleural Once   calcium  carbonate  1 tablet Oral Daily   doxycycline   100 mg Oral Q12H   fluticasone  furoate-vilanterol  1 puff Inhalation Daily   guaiFENesin   600 mg Oral BID   levothyroxine   100 mcg Oral Q0600   lidocaine  1 %  10 mL Other Once   magnesium  oxide  400 mg Oral BID   melatonin  6 mg Oral QHS   metroNIDAZOLE   500 mg Oral Q12H   midodrine   5 mg Oral TID WC   nicotine   21 mg Transdermal Daily   ranolazine   500 mg Oral BID   rosuvastatin   40 mg Oral Daily   sodium chloride  flush  10 mL Intrapleural Q8H   Continuous Infusions:     LOS: 17 days       Renato Applebaum, MD [facility]   "

## 2024-12-12 NOTE — Progress Notes (Signed)
 Pt sat at 85% on RA, placed on 2L [place]. Notified physician.

## 2024-12-15 ENCOUNTER — Encounter (HOSPITAL_COMMUNITY): Admission: EM | Payer: Self-pay | Source: Ambulatory Visit | Attending: Student

## 2024-12-31 ENCOUNTER — Encounter

## 2025-04-01 ENCOUNTER — Encounter

## 2025-07-01 ENCOUNTER — Encounter

## 2025-09-30 ENCOUNTER — Encounter

## 2025-12-30 ENCOUNTER — Encounter

## 2026-03-31 ENCOUNTER — Encounter
